# Patient Record
Sex: Male | Born: 1955
Health system: Southern US, Community
[De-identification: ages and names within clinical notes are randomized; demographics above are authoritative.]

## PROBLEM LIST (undated history)

## (undated) DIAGNOSIS — Z87442 Personal history of urinary calculi: Secondary | ICD-10-CM

## (undated) DIAGNOSIS — Z860100 Personal history of colon polyps, unspecified: Secondary | ICD-10-CM

## (undated) DIAGNOSIS — L409 Psoriasis, unspecified: Secondary | ICD-10-CM

## (undated) DIAGNOSIS — Z808 Family history of malignant neoplasm of other organs or systems: Secondary | ICD-10-CM

## (undated) DIAGNOSIS — K859 Acute pancreatitis without necrosis or infection, unspecified: Secondary | ICD-10-CM

## (undated) DIAGNOSIS — N200 Calculus of kidney: Secondary | ICD-10-CM

## (undated) DIAGNOSIS — Z800 Family history of malignant neoplasm of digestive organs: Secondary | ICD-10-CM

## (undated) DIAGNOSIS — K219 Gastro-esophageal reflux disease without esophagitis: Secondary | ICD-10-CM

## (undated) HISTORY — PX: OTHER SURGICAL HISTORY: SHX169

## (undated) HISTORY — DX: Family history of malignant neoplasm of digestive organs: Z80.0

## (undated) HISTORY — DX: Personal history of colon polyps, unspecified: Z86.0100

## (undated) HISTORY — DX: Calculus of kidney: N20.0

## (undated) HISTORY — DX: Personal history of colonic polyps: Z86.010

## (undated) HISTORY — DX: Family history of malignant neoplasm of other organs or systems: Z80.8

## (undated) HISTORY — PX: COLONOSCOPY: SHX174

## (undated) HISTORY — DX: Psoriasis, unspecified: L40.9

## (undated) HISTORY — PX: ERCP: SHX60

## (undated) HISTORY — DX: Acute pancreatitis without necrosis or infection, unspecified: K85.90

## (undated) HISTORY — PX: SHOULDER SURGERY: SHX246

## (undated) HISTORY — DX: Gastro-esophageal reflux disease without esophagitis: K21.9

---

## 1992-08-04 ENCOUNTER — Encounter: Payer: Self-pay | Admitting: Internal Medicine

## 2003-12-08 LAB — HM COLONOSCOPY

## 2005-06-03 ENCOUNTER — Emergency Department: Payer: Self-pay | Admitting: Emergency Medicine

## 2005-06-19 ENCOUNTER — Ambulatory Visit: Payer: Self-pay

## 2005-06-20 ENCOUNTER — Ambulatory Visit: Payer: Self-pay

## 2005-07-30 ENCOUNTER — Ambulatory Visit (HOSPITAL_BASED_OUTPATIENT_CLINIC_OR_DEPARTMENT_OTHER): Admission: RE | Admit: 2005-07-30 | Discharge: 2005-07-30 | Payer: Self-pay | Admitting: Orthopaedic Surgery

## 2009-04-28 ENCOUNTER — Ambulatory Visit: Payer: Self-pay | Admitting: Internal Medicine

## 2009-04-28 ENCOUNTER — Encounter (INDEPENDENT_AMBULATORY_CARE_PROVIDER_SITE_OTHER): Payer: Self-pay | Admitting: *Deleted

## 2009-04-28 DIAGNOSIS — J309 Allergic rhinitis, unspecified: Secondary | ICD-10-CM | POA: Insufficient documentation

## 2009-04-28 DIAGNOSIS — Z860100 Personal history of colon polyps, unspecified: Secondary | ICD-10-CM | POA: Insufficient documentation

## 2009-04-28 DIAGNOSIS — K219 Gastro-esophageal reflux disease without esophagitis: Secondary | ICD-10-CM | POA: Insufficient documentation

## 2009-05-01 ENCOUNTER — Telehealth: Payer: Self-pay | Admitting: Internal Medicine

## 2009-05-11 LAB — CONVERTED CEMR LAB
ALT: 38 units/L (ref 0–53)
AST: 23 units/L (ref 0–37)
Albumin: 4.3 g/dL (ref 3.5–5.2)
Alkaline Phosphatase: 71 units/L (ref 39–117)
BUN: 14 mg/dL (ref 6–23)
Basophils Absolute: 0 10*3/uL (ref 0.0–0.1)
Basophils Relative: 0.4 % (ref 0.0–3.0)
Bilirubin, Direct: 0 mg/dL (ref 0.0–0.3)
CO2: 30 meq/L (ref 19–32)
Calcium: 8.9 mg/dL (ref 8.4–10.5)
Chloride: 103 meq/L (ref 96–112)
Cholesterol: 203 mg/dL — ABNORMAL HIGH (ref 0–200)
Creatinine, Ser: 0.9 mg/dL (ref 0.4–1.5)
Direct LDL: 154.8 mg/dL
Eosinophils Absolute: 0.2 10*3/uL (ref 0.0–0.7)
Eosinophils Relative: 3.3 % (ref 0.0–5.0)
GFR calc non Af Amer: 93.48 mL/min (ref >60)
Glucose, Bld: 95 mg/dL (ref 70–99)
HCT: 43.4 % (ref 39.0–52.0)
HDL: 38.7 mg/dL — ABNORMAL LOW (ref >39.00)
Hemoglobin: 15 g/dL (ref 13.0–17.0)
Lymphocytes Relative: 32.4 % (ref 12.0–46.0)
Lymphs Abs: 2 10*3/uL (ref 0.7–4.0)
MCHC: 34.6 g/dL (ref 30.0–36.0)
MCV: 93.3 fL (ref 78.0–100.0)
Monocytes Absolute: 0.6 10*3/uL (ref 0.1–1.0)
Monocytes Relative: 9.5 % (ref 3.0–12.0)
Neutro Abs: 3.4 10*3/uL (ref 1.4–7.7)
Neutrophils Relative %: 54.4 % (ref 43.0–77.0)
PSA: 0.7 ng/mL (ref 0.10–4.00)
Phosphorus: 3.3 mg/dL (ref 2.3–4.6)
Platelets: 207 10*3/uL (ref 150.0–400.0)
Potassium: 4.2 meq/L (ref 3.5–5.1)
RBC: 4.65 M/uL (ref 4.22–5.81)
RDW: 12.3 % (ref 11.5–14.6)
Sodium: 140 meq/L (ref 135–145)
TSH: 0.98 microintl units/mL (ref 0.35–5.50)
Total Bilirubin: 1.4 mg/dL — ABNORMAL HIGH (ref 0.3–1.2)
Total CHOL/HDL Ratio: 5
Total Protein: 7.3 g/dL (ref 6.0–8.3)
Triglycerides: 81 mg/dL (ref 0.0–149.0)
VLDL: 16.2 mg/dL (ref 0.0–40.0)
WBC: 6.2 10*3/uL (ref 4.5–10.5)

## 2010-08-07 NOTE — Letter (Signed)
Summary: [facility] Records  [facility] Records   Imported By: Beau Fanny 05/03/2009 10:55:35  _____________________________________________________________________  External Attachment:    Type:   Image     Comment:   External Document

## 2010-08-07 NOTE — Assessment & Plan Note (Signed)
Summary: NEW PATIENT TO EST/MK   Vital Signs:  Patient profile:   55 year old male Height:      70.5 inches Weight:      230 pounds BMI:     32.65 Temp:     97.6 degrees F oral Pulse rate:   68 / minute Pulse rhythm:   regular BP sitting:   128 / 70  (left arm) Cuff size:   regular  Vitals Entered By: Mervin Hack CMA Duncan Dull) (April 28, 2009 12:07 PM)  History of Present Illness: Chief Complaint: new patient to establish care  Feels well Just needed a check up  Ongoing psoriasis finally with reasonable control on enbrel     Preventive Screening-Counseling & Management  Alcohol-Tobacco     Smoking Status: quit  Allergies (verified): No Known Drug Allergies  Past History:  Past Medical History: Psaoriasis---------------------------------------------Dr Henderson Colonic polyps, hx of GERD Allergic rhinitis  Past Surgical History: Pancreatitis    ~2000        from alcohol presumably  Family History: Dad died of blockage to brain @86 . Had prostate cancer Mom died of stomach cancer in her 11's 1 brother  1 sister 2 half sisters-- 1 had colon cancer 1 half brother--died of DM complicaitons DM on Dad's side No CAD Dad and brother with HTN  Social History: Occupation: Manufacturing engineer daughters Former Smoker--quit in 0102'V Alcohol use-no. Quit drinking after pancreatitis Occupation:  employed Smoking Status:  quit  Review of Systems General:  No regular exercise --just work Weight is stable over the past year Sleeps fine--wife notes snoring at times wears seat belt. Eyes:  Denies double vision and vision loss-1 eye. ENT:  Denies decreased hearing and ringing in ears; full dentures--no problems. CV:  Complains of shortness of breath with exertion; denies chest pain or discomfort, difficulty breathing at night, difficulty breathing while lying down, fainting, lightheadness, and palpitations; stable DOE. Resp:  Denies cough  and shortness of breath. GI:  Complains of indigestion; denies abdominal pain, bloody stools, change in bowel habits, dark tarry stools, nausea, and vomiting; takes zantac and now prevacid---did better with Rx in past. GU:  Denies erectile dysfunction, urinary frequency, and urinary hesitancy. MS:  Complains of joint pain; denies joint swelling; some knee pain at times. Derm:  Denies lesion(s) and rash; Did have Basal cell carcinoma on left shoulder. Neuro:  Complains of numbness and tingling; denies headaches and weakness; occ numbness in hands when driving or in bed--quickly resolves. Psych:  Denies anxiety and depression. Heme:  Denies abnormal bruising and enlarge lymph nodes. Allergy:  Complains of seasonal allergies and sneezing; uses OTC med.  Physical Exam  General:  alert and normal appearance.   Eyes:  pupils equal, pupils round, pupils reactive to light, and no optic disk abnormalities.   Ears:  R ear normal and L ear normal.   Mouth:  no erythema, no lesions, and edentulous.   Neck:  supple, no masses, no thyromegaly, no carotid bruits, and no cervical lymphadenopathy.   Lungs:  normal respiratory effort and normal breath sounds.   Heart:  normal rate, regular rhythm, no murmur, and no gallop.   Abdomen:  soft and non-tender.   Rectal:  no hemorrhoids and no masses.   Prostate:  no gland enlargement and no nodules.   Msk:  no joint tenderness and no joint swelling.   Pulses:  1+ in feet Extremities:  no edema Neurologic:  alert & oriented X3 and strength normal in  all extremities.   Skin:  no rashes and no suspicious lesions.   Axillary Nodes:  No palpable lymphadenopathy Psych:  normally interactive, good eye contact, not anxious appearing, and not depressed appearing.     Impression & Recommendations:  Problem # 1:  PREVENTIVE HEALTH CARE (ICD-V70.0) Assessment Comment Only  due for PSA  Orders: TLB-Lipid Panel (80061-LIPID)  Problem # 2:  COLONIC POLYPS, HX OF  (ICD-V12.72) Assessment: Comment Only  colon >5 years ago due for follow up  Orders: Venipuncture ([address])  Problem # 3:  GERD (ICD-530.81) Assessment: Deteriorated  will change to prilosec  His updated medication list for this problem includes:    Prilosec Otc 20 Mg Tbec (Omeprazole magnesium) .Marland Kitchen... 1 daily for indigestion  Orders: TLB-Renal Function Panel (80069-RENAL) TLB-CBC Platelet - w/Differential (85025-CBCD) TLB-Hepatic/Liver Function Pnl (80076-HEPATIC) TLB-TSH (Thyroid Stimulating Hormone) (84443-TSH)  Complete Medication List: 1)  Enbrel 50 Mg/ml Soln (Etanercept) .... Inject once weekly 2)  Prilosec Otc 20 Mg Tbec (Omeprazole magnesium) .Marland Kitchen.. 1 daily for indigestion  Other Orders: TLB-PSA (Prostate Specific Antigen) (84153-PSA)  Patient Instructions: 1)  Please schedule a follow-up appointment in 1 year.  2)  Schedule a colonoscopy/ sigmoidoscopy to help detect colon cancer.   Current Allergies (reviewed today): No known allergies    Colonoscopy  Procedure date:  07/08/2002  Findings:      done for bleeding Small polyps    Immunization History:  Tetanus/Td Immunization History:    Tetanus/Td:  Historical (07/08/2005)

## 2010-08-07 NOTE — Letter (Signed)
Summary: Previsit letter  [facility]  [address] [place], [place] [address]   Phone: [phone]  Fax: [phone]       04/28/2009 MRN: [id number]  Cory [name] [address] [place], [place]  [address]  Dear Cory [name],  Welcome to the Gastroenterology Division at [facility].    You are scheduled to see a nurse for your pre-procedure visit on 05-26-09 at 9am on the 3rd floor at [facility], [address]  We ask that you try to arrive at our office 15 minutes prior to your appointment time to allow for check-in.  Your nurse visit will consist of discussing your medical and surgical history, your immediate family medical history, and your medications.    Please bring a complete list of all your medications or, if you prefer, bring the medication bottles and we will list them.  We will need to be aware of both prescribed and over the counter drugs.  We will need to know exact dosage information as well.  If you are on blood thinners (Coumadin, Plavix, Aggrenox, Ticlid, etc.) please call our office today/prior to your appointment, as we need to consult with your physician about holding your medication.   Please be prepared to read and sign documents such as consent forms, a financial agreement, and acknowledgement forms.  If necessary, and with your consent, a friend or relative is welcome to sit-in on the nurse visit with you.  Please bring your insurance card so that we may make a copy of it.  If your insurance requires a referral to see a specialist, please bring your referral form from your primary care physician.  No co-pay is required for this nurse visit.     If you cannot keep your appointment, please call [phone] to cancel or reschedule prior to your appointment date.  This allows [place] the opportunity to schedule an appointment for another patient in need of care.    Thank you for choosing [facility] for your medical needs.  We  appreciate the opportunity to care for you.  Please visit [place] at our website  to learn more about our practice.                     Sincerely.                                                                                                                   The Gastroenterology Division

## 2010-08-07 NOTE — Progress Notes (Signed)
  Phone Note From Other Clinic   Summary of Call: records from Dr Maryruth Bun reviewed He had colonoscopy 12/08/03 and it showed only internal hemorrhoids He is not due again till 2015! Initial call taken by: Cindee Salt MD,  May 01, 2009 5:25 PM  Follow-up for Phone Call        Called wife told her due date of husbands next Colonoscopy 2015. Follow-up by: Carlton Adam,  May 02, 2009 11:35 AM      Colonoscopy  Procedure date:  12/08/2003  Findings:      Results: Hemorrhoids.  Dr Maryruth Bun

## 2010-11-23 NOTE — Op Note (Signed)
NAMEBRENDAN, Cory [name]                ACCOUNT NO.:  [id number]   MEDICAL RECORD NO.:  [id number]          PATIENT TYPE:  AMB   LOCATION:  DSC                          FACILITY:  [facility]   PHYSICIAN:  Lubertha Basque. Dalldorf, M.D.DATE OF BIRTH:  1956/07/08   DATE OF PROCEDURE:  07/30/2005  DATE OF DISCHARGE:                                 OPERATIVE REPORT   PREOPERATIVE DIAGNOSES:  1.  Left shoulder impingement.  2.  Left shoulder rotator cuff tear.   POSTOPERATIVE DIAGNOSES:  1.  Left shoulder impingement.  2.  Left shoulder rotator cuff tear.  3.  Left shoulder biceps tear.   PROCEDURE:  1.  Left shoulder arthroscopic acromioplasty.  2.  Left shoulder debridement biceps stump and rotator cuff.  3.  Left shoulder partial claviculectomy.  .   ANESTHESIA:  General and block.   ATTENDING SURGEON:  Lubertha Basque. Jerl Santos, M.D.   ASSISTANT:  Lindwood Qua, P.A.   INDICATIONS FOR PROCEDURE:  The patient is a 55 year old man who injured his  left shoulder at work about two months ago.  He has persisted with pain and  weakness in the shoulder despite aggressive physical therapy and activity  restriction.  By MRI scan, he has a retracted supraspinatus tear.  He has  difficulty using this arm and pain at rest and is offered an arthroscopy  with probable rotator cuff repair either arthroscopic or open.  Informed  operative consent was obtained after the discussion about the possible  complications of,  reaction to anesthesia and infection.   DESCRIPTION OF PROCEDURE:  The patient was taken to the operating suite  where a general anesthetic was applied without difficulty.  He was also  given a block in the preanesthesia area.  He was  positioned in beach-chair  position and prepped, draped in a normal sterile fashion.  After the  administration of IV Kefzol, an arthroscopy of the left shoulder was  performed through a total of three portals.  The glenohumeral joint showed  no degenerative  change while the biceps tendon appeared to be ruptured with  a stump of this tendon stuck in the joint.  The rotator cuff was basically  completely absent.  The entire humeral head at the  greater tuberosity was  bare.  I performed a conservative acromioplasty with the bur in the lateral  position followed by transfer of the bur to the posterior position.  I also  removed a spur from the distal aspect of the clavicle.  I then dissected for  a rotator cuff and really found very little tissue.  I freed this up with  the shaver and a arthroscopic grabber, but I was unable to advance the  residual rotator cuff more than about 1 cm off the rim of the glenoid. This  was not felt amenable to repair.  A debridement was done.  He did have small  slips of the anterior and posterior tissue left, but this was a massive tear  and involved most of  the rotator cuff.  The subscapularis also did not  appear completely normal and was  debrided back to fairly stable appearing  tissue.  The shoulder was thoroughly irrigated followed by placement of  Marcaine.  Simple sutures of nylon were used to loosely reapproximate the  portals followed by Adaptic and a dry gauze dressing with tape.  Estimated  blood loss and intraoperative fluids can be obtained from the anesthesia  records.   DISPOSITION:  The patient was extubated in the operating room and taken to  recovery in stable condition.  Plans were for him to go home the same day  and to follow up in the office in a week.  I will contact him by phone  tonight.      Lubertha Basque Jerl Santos, M.D.  Electronically Signed     PGD/MEDQ  D:  07/30/2005  T:  07/31/2005  Job:  [id number]

## 2011-01-28 ENCOUNTER — Encounter: Payer: Self-pay | Admitting: Gastroenterology

## 2011-01-28 ENCOUNTER — Other Ambulatory Visit (INDEPENDENT_AMBULATORY_CARE_PROVIDER_SITE_OTHER): Payer: 59

## 2011-01-28 ENCOUNTER — Ambulatory Visit (INDEPENDENT_AMBULATORY_CARE_PROVIDER_SITE_OTHER)
Admission: RE | Admit: 2011-01-28 | Discharge: 2011-01-28 | Disposition: A | Discharge status: Home / Self Care | Payer: 59 | Source: Ambulatory Visit | Attending: Gastroenterology | Admitting: Gastroenterology

## 2011-01-28 ENCOUNTER — Ambulatory Visit (INDEPENDENT_AMBULATORY_CARE_PROVIDER_SITE_OTHER): Payer: 59 | Admitting: Gastroenterology

## 2011-01-28 DIAGNOSIS — K859 Acute pancreatitis without necrosis or infection, unspecified: Secondary | ICD-10-CM

## 2011-01-28 DIAGNOSIS — R109 Unspecified abdominal pain: Secondary | ICD-10-CM

## 2011-01-28 DIAGNOSIS — R1011 Right upper quadrant pain: Secondary | ICD-10-CM | POA: Insufficient documentation

## 2011-01-28 DIAGNOSIS — K219 Gastro-esophageal reflux disease without esophagitis: Secondary | ICD-10-CM

## 2011-01-28 LAB — CBC WITH DIFFERENTIAL/PLATELET
Basophils Absolute: 0 10*3/uL (ref 0.0–0.1)
Basophils Relative: 0.4 % (ref 0.0–3.0)
Eosinophils Absolute: 0.3 10*3/uL (ref 0.0–0.7)
Eosinophils Relative: 4 % (ref 0.0–5.0)
HCT: 41.5 % (ref 39.0–52.0)
Hemoglobin: 14.2 g/dL (ref 13.0–17.0)
Lymphocytes Relative: 30.6 % (ref 12.0–46.0)
Lymphs Abs: 1.9 10*3/uL (ref 0.7–4.0)
MCHC: 34.2 g/dL (ref 30.0–36.0)
MCV: 93.1 fl (ref 78.0–100.0)
Monocytes Absolute: 0.5 10*3/uL (ref 0.1–1.0)
Monocytes Relative: 8.2 % (ref 3.0–12.0)
Neutro Abs: 3.6 10*3/uL (ref 1.4–7.7)
Neutrophils Relative %: 56.8 % (ref 43.0–77.0)
Platelets: 217 10*3/uL (ref 150.0–400.0)
RBC: 4.46 Mil/uL (ref 4.22–5.81)
RDW: 13.5 % (ref 11.5–14.6)
WBC: 6.3 10*3/uL (ref 4.5–10.5)

## 2011-01-28 LAB — COMPREHENSIVE METABOLIC PANEL
ALT: 23 U/L (ref 0–53)
AST: 16 U/L (ref 0–37)
Albumin: 4.5 g/dL (ref 3.5–5.2)
Alkaline Phosphatase: 77 U/L (ref 39–117)
BUN: 13 mg/dL (ref 6–23)
CO2: 27 mEq/L (ref 19–32)
Calcium: 8.7 mg/dL (ref 8.4–10.5)
Chloride: 105 mEq/L (ref 96–112)
Creatinine, Ser: 0.8 mg/dL (ref 0.4–1.5)
GFR: 103.41 mL/min (ref >60.00)
Glucose, Bld: 96 mg/dL (ref 70–99)
Potassium: 4 mEq/L (ref 3.5–5.1)
Sodium: 137 mEq/L (ref 135–145)
Total Bilirubin: 0.6 mg/dL (ref 0.3–1.2)
Total Protein: 7.7 g/dL (ref 6.0–8.3)

## 2011-01-28 LAB — URINALYSIS, ROUTINE W REFLEX MICROSCOPIC
Bilirubin Urine: NEGATIVE
Hgb urine dipstick: NEGATIVE
Ketones, ur: NEGATIVE
Leukocytes, UA: NEGATIVE
Nitrite: NEGATIVE
Specific Gravity, Urine: 1.025 (ref 1.000–1.030)
Total Protein, Urine: NEGATIVE
Urine Glucose: NEGATIVE
Urobilinogen, UA: 0.2 (ref 0.0–1.0)
pH: 5.5 (ref 5.0–8.0)

## 2011-01-28 MED ORDER — PANTOPRAZOLE SODIUM 20 MG PO TBEC: 40.0000 mg | DELAYED_RELEASE_TABLET | Freq: Every day | ORAL | Status: DC

## 2011-01-28 NOTE — Patient Instructions (Signed)
You will go to the basement today for labs You will go to the basement today for an X-Ray We are giving you Priolsec samples We will send in a prescription to your pharmacy of the Prilosec

## 2011-01-28 NOTE — Progress Notes (Signed)
History of Present Illness:  Cory [name] is a 55 year old white male referred at the request of Dr. Alphonsus Sias for evaluation of abdominal pain. Approximately 2 days ago he developed severe right flank pain that lasted about 20 minutes. He had a second episode a day later of less intensity. He denies dysuria or urinary frequency. Pain was not accompanied by nausea, vomiting or change in bowel habits. He has had discomfort in his right lower back in the past with bending and twisting.  Patient has a history of colon polyps and was last examined over 8 years ago.  He complains of frequent pyrosis for which he is taking Zantac with limited success.    Review of Systems: Pertinent positive and negative review of systems were noted in the above HPI section. All other review of systems were otherwise negative.    Current Medications, Allergies, Past Medical History, Past Surgical History, Family History and Social History were reviewed in [employer] electronic medical record  Vital signs were reviewed in today's medical record. Physical Exam: General: Well developed , well nourished, no acute distress Head: Normocephalic and atraumatic Eyes:  sclerae anicteric, EOMI Ears: Normal auditory acuity Mouth: No deformity or lesions Lungs: Clear throughout to auscultation Heart: Regular rate and rhythm; no murmurs, rubs or bruits Abdomen: Soft, non tender and non distended. No masses, hepatosplenomegaly or hernias noted. Normal Bowel sounds; there is no CVA tenderness Rectal:deferred Musculoskeletal: Symmetrical with no gross deformities  Pulses:  Normal pulses noted Extremities: No clubbing, cyanosis, edema or deformities noted Neurological: Alert oriented x 4, grossly nonfocal Psychological:  Alert and cooperative. Normal mood and affect

## 2011-01-28 NOTE — Assessment & Plan Note (Signed)
Plan followup colonoscopy once his current issue of abdominal pain has resolved

## 2011-01-28 NOTE — Assessment & Plan Note (Addendum)
Plan to begin Protonix 40 mg daily 

## 2011-01-28 NOTE — Assessment & Plan Note (Signed)
Right flank pain suspicious for nephrolithiasis. Active intra-abdominal disease is less likely.  Medications #1 urinalysis and abdominal x-ray #2 CBC and LFTs

## 2011-01-30 ENCOUNTER — Telehealth: Payer: Self-pay | Admitting: *Deleted

## 2011-01-30 DIAGNOSIS — R1011 Right upper quadrant pain: Secondary | ICD-10-CM

## 2011-01-30 NOTE — Telephone Encounter (Signed)
DR Arlyce Dice, DID YOU WANT ME TO SCHEDULE THIS PT FOR A CT ABDOMEN/PELVIS??

## 2011-01-30 NOTE — Telephone Encounter (Signed)
Message copied by Marlowe Kays on Wed Jan 30, 2011  4:46 PM ------      Message from: Melvia Heaps MD D      Created: Mon Jan 28, 2011  2:12 PM       Informed patient that x-ray and lab work were normal. There is scheduled him for a CT of the abdomen and pelvis please

## 2011-01-31 NOTE — Telephone Encounter (Signed)
SCHEDULED CT SCAN FOR Monday 02/04/2011 AT 9AM CALLED PT TO INFORM L/M TO R/C

## 2011-01-31 NOTE — Telephone Encounter (Signed)
yes

## 2011-02-01 NOTE — Telephone Encounter (Signed)
Went over instructions with pt, they are to come pick up contrast and instructions today

## 2011-02-04 ENCOUNTER — Ambulatory Visit (INDEPENDENT_AMBULATORY_CARE_PROVIDER_SITE_OTHER)
Admission: RE | Admit: 2011-02-04 | Discharge: 2011-02-04 | Disposition: A | Discharge status: Home / Self Care | Payer: 59 | Source: Ambulatory Visit | Attending: Gastroenterology | Admitting: Gastroenterology

## 2011-02-04 ENCOUNTER — Telehealth: Payer: Self-pay

## 2011-02-04 DIAGNOSIS — R1011 Right upper quadrant pain: Secondary | ICD-10-CM

## 2011-02-04 MED ORDER — IOHEXOL 300 MG/ML  SOLN
100.0000 mL | Freq: Once | INTRAMUSCULAR | Status: AC | PRN
  Administered 2011-02-04: 100 mL via INTRAVENOUS

## 2011-02-04 NOTE — Progress Notes (Signed)
Quick Note:  Inform pt that he has a kidney stone. He needs immediate referral to urology. See if Dr. Jacquelyne Balint can see him ______

## 2011-02-04 NOTE — Telephone Encounter (Signed)
Left message for pt to call back. Spoke with pt and scheduled him to see Dr. Jacquelyne Balint 02/06/11@1pm . Pt states he cannot make this appt. Gave pt the number of [phone] to call and reschedule his appt. Pt states he will call and do this. Records faxed to [phone].

## 2011-02-04 NOTE — Telephone Encounter (Signed)
Message copied by Michele Mcalpine on Mon Feb 04, 2011  3:29 PM ------      Message from: Melvia Heaps MD D      Created: Mon Feb 04, 2011  3:24 PM       Inform pt that he has a kidney stone.      He needs immediate referral to urology.  See if Dr. Jacquelyne Balint can see him

## 2011-03-14 ENCOUNTER — Encounter: Payer: Self-pay | Admitting: Internal Medicine

## 2011-03-15 ENCOUNTER — Encounter: Payer: Self-pay | Admitting: Internal Medicine

## 2011-03-15 ENCOUNTER — Ambulatory Visit (INDEPENDENT_AMBULATORY_CARE_PROVIDER_SITE_OTHER): Payer: 59 | Admitting: Internal Medicine

## 2011-03-15 DIAGNOSIS — H9312 Tinnitus, left ear: Secondary | ICD-10-CM | POA: Insufficient documentation

## 2011-03-15 DIAGNOSIS — H9319 Tinnitus, unspecified ear: Secondary | ICD-10-CM

## 2011-03-15 DIAGNOSIS — N200 Calculus of kidney: Secondary | ICD-10-CM | POA: Insufficient documentation

## 2011-03-15 NOTE — Progress Notes (Signed)
  Subjective:    Patient ID: Cory [name], male    DOB: [birth year], 55 y.o.   MRN: [id number]  HPI Left ear problems for 1-2 weeks Just tinnitus but no pain Concerned about possible swimmer's ear Used OTC solution in past few days but no help Hearing is off  No vertigo No balance or walking problems No changes in vision  Does have shotgun and handgun Shoots off right shoulder  Current Outpatient Prescriptions on File Prior to Visit  Medication Sig Dispense Refill  . ENBREL 50 MG/ML injection 50 mg once a week.       . Ibuprofen 200 MG CAPS Take 1 capsule by mouth as needed.        . pantoprazole (PROTONIX) 20 MG tablet Take 2 tablets (40 mg total) by mouth daily.  30 tablet  2    No Known Allergies  Past Medical History  Diagnosis Date  . Psoriasis   . Pancreatitis, acute   . Personal history of colonic polyps     [facility]  . GERD (gastroesophageal reflux disease)   . Nephrolithiasis     [facility]    Past Surgical History  Procedure Date  . Shoulder surgery   . Groin surgery     as a child    Family History  Problem Relation Age of Onset  . Colon cancer Neg Hx   . Stomach cancer Mother   . Cancer Mother     stomach cancer  . Diabetes Brother   . Hypertension Brother   . Cancer Father     prostate cancer  . Hypertension Father   . Cancer Sister     colon cancer    History   Social History  . Marital Status: Married    Spouse Name: N/A    Number of Children: 2  . Years of Education: N/A   Occupational History  . textile    Social History Main Topics  . Smoking status: Former Smoker    Types: Cigarettes    Quit date: 07/08/1988  . Smokeless tobacco: Never Used  . Alcohol Use: No     recovering alcoholic for 13 years  . Drug Use: No  . Sexually Active: Not on file   Other Topics Concern  . Not on file   Social History Narrative  . No narrative on file      Review of Systems Still has kidney stone---sees urologist  still No nausea or vomiting    Objective:   Physical Exam  Constitutional: He appears well-developed and well-nourished. No distress.  HENT:  Head: Normocephalic and atraumatic.  Right Ear: External ear normal.  Left Ear: External ear normal.  Mouth/Throat: Oropharynx is clear and moist. No oropharyngeal exudate.       Deep hard cerumen bilaterally  Eyes: Conjunctivae and EOM are normal. Pupils are equal, round, and reactive to light.       No nystagmus  Neck: Normal range of motion. Neck supple.  Musculoskeletal: Normal range of motion. He exhibits no edema and no tenderness.  Lymphadenopathy:    He has no cervical adenopathy.  Neurological: He is alert. He has normal strength. He displays no tremor. No cranial nerve deficit. He exhibits normal muscle tone. He displays a negative Romberg sign. Coordination and gait normal.  Psychiatric: He has a normal mood and affect. His behavior is normal. Judgment and thought content normal.          Assessment & Plan:

## 2011-03-15 NOTE — Patient Instructions (Signed)
Please try debrox or similar ear wax removal drops daily for a week (you can also use 1/2 strength peroxide--mix with water). Then flush with warm tap water. If the ringing persists, or your hearing worsens, please call for referral to ear specialist

## 2011-03-15 NOTE — Assessment & Plan Note (Addendum)
Some hearing loss as well This could be from the cerumen No neuro findings to suggest brain stem mass or ischemia  Discussed options He will use cerumen Rx to clear ears If persists or hearing worsens, will proceed with ENT eval

## 2011-03-29 ENCOUNTER — Telehealth: Payer: Self-pay | Admitting: *Deleted

## 2011-03-29 DIAGNOSIS — H6120 Impacted cerumen, unspecified ear: Secondary | ICD-10-CM

## 2011-03-29 NOTE — Telephone Encounter (Signed)
Pt was seen for wax build up in his ears, decreased hearing.  He still has this problem and would like to be referred to ENT.  Prefers [facility], but wants an appt next week, because he's off work,  so he will go to whoever can get him in.

## 2011-04-01 NOTE — Telephone Encounter (Signed)
Appt made with Dr Jenne Campus on 04/02/2011 .MK

## 2011-04-09 ENCOUNTER — Encounter: Payer: Self-pay | Admitting: Family Medicine

## 2011-04-09 ENCOUNTER — Ambulatory Visit (INDEPENDENT_AMBULATORY_CARE_PROVIDER_SITE_OTHER): Payer: 59 | Admitting: Family Medicine

## 2011-04-09 VITALS — BP 120/70 | HR 98 | Temp 97.7°F | Ht 71.0 in | Wt 220.0 lb

## 2011-04-09 DIAGNOSIS — M25519 Pain in unspecified shoulder: Secondary | ICD-10-CM

## 2011-04-09 DIAGNOSIS — M259 Joint disorder, unspecified: Secondary | ICD-10-CM

## 2011-04-09 DIAGNOSIS — M19019 Primary osteoarthritis, unspecified shoulder: Secondary | ICD-10-CM | POA: Insufficient documentation

## 2011-04-09 DIAGNOSIS — M25511 Pain in right shoulder: Secondary | ICD-10-CM | POA: Insufficient documentation

## 2011-04-09 MED ORDER — DICLOFENAC SODIUM 75 MG PO TBEC
75.0000 mg | DELAYED_RELEASE_TABLET | Freq: Two times a day (BID) | ORAL | Status: AC
Start: 2011-04-09 — End: 2012-04-08

## 2011-04-09 NOTE — Patient Instructions (Signed)
Recheck in 4-5 weeks

## 2011-04-09 NOTE — Progress Notes (Signed)
  Subjective:    Patient ID: Cory [name], male    DOB: 01/23/1956, 55 y.o.   MRN: [id number]  HPI  Cory [name], a 55 y.o. male presents today in the office for the following:    Right shoulder has been hurting. Has been aching and hurting at night. Hurting at night and some pain with overhead. At least a month, maybe two or three months. No distinct injury. The patient does do repetitive motions with his hand and upper chest remedies on the right as well as left. Pain with reaching across his body.  He does have a history of a left rotator cuff repair. No prior injuries, fractures, or dislocations or operative interventions on the right shoulder.  Abd hurts the most.   U/s R AC joint inj  The PMH, PSH, Social History, Family History, Medications, and allergies have been reviewed in [facility], and have been updated if relevant.   Review of Systems REVIEW OF SYSTEMS  GEN: No fevers, chills. Nontoxic. Primarily MSK c/o today. MSK: Detailed in the HPI GI: tolerating PO intake without difficulty Neuro: No numbness, parasthesias, or tingling associated. Otherwise the pertinent positives of the ROS are noted above.      Objective:   Physical Exam   Physical Exam  Blood pressure 120/70, pulse 98, temperature 97.7 F (36.5 C), temperature source Oral, height 5\' 11"  (1.803 m), weight 220 lb (99.791 kg), SpO2 98.00%.  GEN: WDWN, NAD, Non-toxic, A & O x 3 HEENT: Atraumatic, Normocephalic. Neck supple. No masses, No LAD. Ears and Nose: No external deformity. EXTR: No c/c/e NEURO Normal gait.  PSYCH: Normally interactive. Conversant. Not depressed or anxious appearing.  Calm demeanor.   Right shoulder: Full range of motion with a painful arc of motion minimal. Tender to palpation at the a.c. joint, nontender along the clavicle. Positive crossover. Positive crossover compression test. Negative speed test. Negative Yergason status. Negative Neer test. Negative Leanord Asal test. Negative  sulcus sign. No generalized laxity. Negative apprehension test.  Neurovascularly intact  Diagnostic Ultrasound Evaluation Terason t3000, MSK ultrasound, MSK probe Anatomy scanned: R shoulder Indication: Pain Findings: The supraspinatus and subscapularis appear intact. There is no subcoracoid impingement and no subacromial impingement with dynamic testing. No subacromial bursitis is seen. There is some additional fluid seen at the acromioclavicular joint with minimal osteoarthritic changes. No fluid significantly at the biceps tendon, which also appears intact.     Assessment & Plan:   1. AC joint arthropathy  diclofenac (VOLTAREN) 75 MG EC tablet  2. Shoulder pain, right      I think this is primarily inflammation at the a.c. joint. This is often seen with people who do repetitive motion activities or do heavy bench pressing. We will try to do a a.c. joint injection, place him on scheduling inflammatories.  AC Joint Injection, RIGHT Verbal consent was obtained from the patient. Risks, benefits, and alternatives have been reviewed. The patient was prepped with Betadine. Ethyl Chloride used for anesthesia. Under sterile conditions, 1/2 cc of Lidocaine 1% and 1/2 cc of depo-medrol 40 mg directly injected into at the superior-lateral AC joint. The patient tolerated the procedure well and had decreased symptoms after injection. No complications.

## 2011-05-13 ENCOUNTER — Ambulatory Visit: Payer: 59 | Admitting: Family Medicine

## 2011-09-13 ENCOUNTER — Ambulatory Visit (INDEPENDENT_AMBULATORY_CARE_PROVIDER_SITE_OTHER): Payer: 59 | Admitting: Internal Medicine

## 2011-09-13 ENCOUNTER — Encounter: Payer: Self-pay | Admitting: Internal Medicine

## 2011-09-13 VITALS — BP 138/70 | HR 64 | Temp 98.4°F | Ht 71.0 in | Wt 227.0 lb

## 2011-09-13 DIAGNOSIS — L408 Other psoriasis: Secondary | ICD-10-CM

## 2011-09-13 DIAGNOSIS — E785 Hyperlipidemia, unspecified: Secondary | ICD-10-CM | POA: Insufficient documentation

## 2011-09-13 DIAGNOSIS — L409 Psoriasis, unspecified: Secondary | ICD-10-CM | POA: Insufficient documentation

## 2011-09-13 DIAGNOSIS — Z0000 Encounter for general adult medical examination without abnormal findings: Secondary | ICD-10-CM | POA: Insufficient documentation

## 2011-09-13 LAB — LIPID PANEL
Cholesterol: 190 mg/dL (ref 0–200)
HDL: 42.7 mg/dL (ref >39.00)
LDL Cholesterol: 135 mg/dL — ABNORMAL HIGH (ref 0–99)
Total CHOL/HDL Ratio: 4
Triglycerides: 63 mg/dL (ref 0.0–149.0)
VLDL: 12.6 mg/dL (ref 0.0–40.0)

## 2011-09-13 LAB — PSA: PSA: 0.67 ng/mL (ref 0.10–4.00)

## 2011-09-13 LAB — GLUCOSE, RANDOM: Glucose, Bld: 77 mg/dL (ref 70–99)

## 2011-09-13 NOTE — Assessment & Plan Note (Signed)
Generally healthy Discussed fitness Will do PSA after discussion

## 2011-09-13 NOTE — Progress Notes (Signed)
Subjective:    Patient ID: Cory [name], male    DOB: April 09, 1956, 56 y.o.   MRN: [id number]  HPI Here for physical Shoulder still aches--esp at night. Tries some advil--some help  Still on embrel for psoriasis Dr Orson Aloe monitors labs  Had kidney stone that he passed Has another one up higher---doesn't seem to be giving any problems  No real fitness efforts Had been laid off for a while but is back again  Current Outpatient Prescriptions on File Prior to Visit  Medication Sig Dispense Refill  . diclofenac (VOLTAREN) 75 MG EC tablet Take 1 tablet (75 mg total) by mouth 2 (two) times daily.  60 tablet  3  . ENBREL 50 MG/ML injection 50 mg once a week.         No Known Allergies  Past Medical History  Diagnosis Date  . Psoriasis   . Pancreatitis, acute   . Personal history of colonic polyps     [facility]  . GERD (gastroesophageal reflux disease)   . Nephrolithiasis     [facility]  . Hyperlipidemia     Past Surgical History  Procedure Date  . Shoulder surgery   . Groin surgery     as a child    Family History  Problem Relation Age of Onset  . Colon cancer Neg Hx   . Stomach cancer Mother   . Cancer Mother     stomach cancer  . Diabetes Brother   . Hypertension Brother   . Cancer Father     prostate cancer  . Hypertension Father   . Cancer Sister     colon cancer    History   Social History  . Marital Status: Married    Spouse Name: N/A    Number of Children: 2  . Years of Education: N/A   Occupational History  . textile    Social History Main Topics  . Smoking status: Former Smoker    Types: Cigarettes    Quit date: 07/08/1988  . Smokeless tobacco: Never Used  . Alcohol Use: No     recovering alcoholic for 13 years  . Drug Use: No  . Sexually Active: Not on file   Other Topics Concern  . Not on file   Social History Narrative  . No narrative on file   Review of Systems  Constitutional: Positive for unexpected weight  change. Negative for fatigue.       Weight up some Wears seat belt  HENT: Negative for hearing loss, dental problem and tinnitus.        Full dentures---no current problems with them Mild seasonal allergies---no meds usually  Eyes: Negative for visual disturbance.       No diplopia or unilateral vision loss Has floater on right---going to eye doctor  Respiratory: Negative for cough, chest tightness and shortness of breath.   Cardiovascular: Positive for chest pain. Negative for palpitations and leg swelling.       Occ left chest pain he attributes to indigestion. Resolves on his own. Is usually postprandial  Gastrointestinal: Negative for nausea, vomiting, abdominal pain, constipation and blood in stool.       Heartburn in past Not as bad of late  Genitourinary: Negative for urgency, frequency and difficulty urinating.       No sexual problems  Musculoskeletal: Positive for arthralgias. Negative for back pain and joint swelling.  Skin: Positive for rash.       Sees Dr Orson Aloe regularly  Neurological: Negative  for dizziness, syncope, weakness, light-headedness, numbness and headaches.  Hematological: Negative for adenopathy. Does not bruise/bleed easily.  Psychiatric/Behavioral: Negative for sleep disturbance and dysphoric mood. The patient is not nervous/anxious.        Objective:   Physical Exam  Constitutional: He is oriented to person, place, and time. He appears well-developed and well-nourished. No distress.  HENT:  Head: Normocephalic and atraumatic.  Right Ear: External ear normal.  Left Ear: External ear normal.  Mouth/Throat: Oropharynx is clear and moist. No oropharyngeal exudate.  Eyes: Conjunctivae and EOM are normal. Pupils are equal, round, and reactive to light.  Neck: Normal range of motion. Neck supple. No thyromegaly present.  Cardiovascular: Normal rate, regular rhythm, normal heart sounds and intact distal pulses.  Exam reveals no gallop.   No murmur  heard. Pulmonary/Chest: Effort normal and breath sounds normal. No respiratory distress. He has no wheezes. He has no rales.  Abdominal: Soft. There is no tenderness.  Musculoskeletal: He exhibits no edema and no tenderness.  Lymphadenopathy:    He has no cervical adenopathy.  Neurological: He is alert and oriented to person, place, and time.  Skin: Rash noted.       Scattered psoriatic lesions  Psychiatric: He has a normal mood and affect. His behavior is normal. Thought content normal.          Assessment & Plan:

## 2011-09-13 NOTE — Assessment & Plan Note (Signed)
Dr Orson Aloe treats with Rodena Medin

## 2011-09-24 ENCOUNTER — Encounter: Payer: Self-pay | Admitting: *Deleted

## 2012-05-05 ENCOUNTER — Emergency Department: Payer: Self-pay | Admitting: Emergency Medicine

## 2012-05-15 ENCOUNTER — Emergency Department: Payer: Self-pay | Admitting: Emergency Medicine

## 2012-09-25 ENCOUNTER — Encounter: Payer: 59 | Admitting: Internal Medicine

## 2012-10-06 ENCOUNTER — Encounter: Payer: Self-pay | Admitting: Internal Medicine

## 2012-10-06 ENCOUNTER — Ambulatory Visit (INDEPENDENT_AMBULATORY_CARE_PROVIDER_SITE_OTHER): Payer: 59 | Admitting: Internal Medicine

## 2012-10-06 VITALS — BP 126/64 | HR 59 | Temp 97.5°F | Ht 71.0 in | Wt 225.0 lb

## 2012-10-06 DIAGNOSIS — K219 Gastro-esophageal reflux disease without esophagitis: Secondary | ICD-10-CM

## 2012-10-06 DIAGNOSIS — L408 Other psoriasis: Secondary | ICD-10-CM

## 2012-10-06 DIAGNOSIS — Z0000 Encounter for general adult medical examination without abnormal findings: Secondary | ICD-10-CM

## 2012-10-06 DIAGNOSIS — L409 Psoriasis, unspecified: Secondary | ICD-10-CM

## 2012-10-06 LAB — BASIC METABOLIC PANEL
BUN: 17 mg/dL (ref 6–23)
CO2: 26 mEq/L (ref 19–32)
Calcium: 9.1 mg/dL (ref 8.4–10.5)
Chloride: 105 mEq/L (ref 96–112)
Creatinine, Ser: 0.9 mg/dL (ref 0.4–1.5)
GFR: 93.51 mL/min (ref >60.00)
Glucose, Bld: 97 mg/dL (ref 70–99)
Potassium: 4.2 mEq/L (ref 3.5–5.1)
Sodium: 137 mEq/L (ref 135–145)

## 2012-10-06 LAB — CBC WITH DIFFERENTIAL/PLATELET
Basophils Absolute: 0 10*3/uL (ref 0.0–0.1)
Basophils Relative: 0.7 % (ref 0.0–3.0)
Eosinophils Absolute: 0.2 10*3/uL (ref 0.0–0.7)
Eosinophils Relative: 3.6 % (ref 0.0–5.0)
HCT: 43.4 % (ref 39.0–52.0)
Hemoglobin: 14.8 g/dL (ref 13.0–17.0)
Lymphocytes Relative: 32.5 % (ref 12.0–46.0)
Lymphs Abs: 2.1 10*3/uL (ref 0.7–4.0)
MCHC: 34 g/dL (ref 30.0–36.0)
MCV: 91.6 fl (ref 78.0–100.0)
Monocytes Absolute: 0.5 10*3/uL (ref 0.1–1.0)
Monocytes Relative: 7.8 % (ref 3.0–12.0)
Neutro Abs: 3.6 10*3/uL (ref 1.4–7.7)
Neutrophils Relative %: 55.4 % (ref 43.0–77.0)
Platelets: 223 10*3/uL (ref 150.0–400.0)
RBC: 4.74 Mil/uL (ref 4.22–5.81)
RDW: 13.4 % (ref 11.5–14.6)
WBC: 6.5 10*3/uL (ref 4.5–10.5)

## 2012-10-06 LAB — HEPATIC FUNCTION PANEL
ALT: 29 U/L (ref 0–53)
AST: 19 U/L (ref 0–37)
Albumin: 4.3 g/dL (ref 3.5–5.2)
Alkaline Phosphatase: 76 U/L (ref 39–117)
Bilirubin, Direct: 0 mg/dL (ref 0.0–0.3)
Total Bilirubin: 1.1 mg/dL (ref 0.3–1.2)
Total Protein: 7.6 g/dL (ref 6.0–8.3)

## 2012-10-06 LAB — TSH: TSH: 1.07 u[IU]/mL (ref 0.35–5.50)

## 2012-10-06 NOTE — Assessment & Plan Note (Signed)
On enbrel Will check labs

## 2012-10-06 NOTE — Assessment & Plan Note (Signed)
Needs to do some exercise Will defer PSA to next year

## 2012-10-06 NOTE — Patient Instructions (Addendum)
Please try reducing candies and using lactaid milk instead of regular.  DASH Diet The DASH diet stands for "Dietary Approaches to Stop Hypertension." It is a healthy eating plan that has been shown to reduce high blood pressure (hypertension) in as little as 14 days, while also possibly providing other significant health benefits. These other health benefits include reducing the risk of breast cancer after menopause and reducing the risk of type 2 diabetes, heart disease, colon cancer, and stroke. Health benefits also include weight loss and slowing kidney failure in patients with chronic kidney disease.  DIET GUIDELINES  Limit salt (sodium). Your diet should contain less than 1500 mg of sodium daily.  Limit refined or processed carbohydrates. Your diet should include mostly whole grains. Desserts and added sugars should be used sparingly.  Include small amounts of heart-healthy fats. These types of fats include nuts, oils, and tub margarine. Limit saturated and trans fats. These fats have been shown to be harmful in the body. CHOOSING FOODS  The following food groups are based on a 2000 calorie diet. See your Registered Dietitian for individual calorie needs. Grains and Grain Products (6 to 8 servings daily)  Eat More Often: Whole-wheat bread, brown rice, whole-grain or wheat pasta, quinoa, popcorn without added fat or salt (air popped).  Eat Less Often: White bread, white pasta, white rice, cornbread. Vegetables (4 to 5 servings daily)  Eat More Often: Fresh, frozen, and canned vegetables. Vegetables may be raw, steamed, roasted, or grilled with a minimal amount of fat.  Eat Less Often/Avoid: Creamed or fried vegetables. Vegetables in a cheese sauce. Fruit (4 to 5 servings daily)  Eat More Often: All fresh, canned (in natural juice), or frozen fruits. Dried fruits without added sugar. One hundred percent fruit juice ( cup [237 mL] daily).  Eat Less Often: Dried fruits with added sugar.  Canned fruit in light or heavy syrup. [employer], Fish, and Poultry (2 servings or less daily. One serving is 3 to 4 oz [85-114 g]).  Eat More Often: Ninety percent or leaner ground beef, tenderloin, sirloin. Round cuts of beef, chicken breast, [place] breast. All fish. Grill, bake, or broil your meat. Nothing should be fried.  Eat Less Often/Avoid: Fatty cuts of meat, [place], or chicken leg, thigh, or wing. Fried cuts of meat or fish. Dairy (2 to 3 servings)  Eat More Often: Low-fat or fat-free milk, low-fat plain or light yogurt, reduced-fat or part-skim cheese.  Eat Less Often/Avoid: Milk (whole, 2%).Whole milk yogurt. Full-fat cheeses. Nuts, Seeds, and Legumes (4 to 5 servings per week)  Eat More Often: All without added salt.  Eat Less Often/Avoid: Salted nuts and seeds, canned beans with added salt. Fats and Sweets (limited)  Eat More Often: Vegetable oils, tub margarines without trans fats, sugar-free gelatin. Mayonnaise and salad dressings.  Eat Less Often/Avoid: Coconut oils, palm oils, butter, stick margarine, cream, half and half, cookies, candy, pie. FOR MORE INFORMATION The Dash Diet Eating Plan: www.dashdiet.org Document Released: 06/13/2011 Document Revised: 09/16/2011 Document Reviewed: 06/13/2011 [facility] Patient Information 2013 [place], [place].

## 2012-10-06 NOTE — Assessment & Plan Note (Signed)
Has had some burping but rare reflux Will check labs Discussed changing diet

## 2012-10-06 NOTE — Progress Notes (Signed)
Subjective:    Patient ID: Cory [name], male    DOB: April 21, 1956, 57 y.o.   MRN: [id number]  HPI Here for physical  Has been burping a lot in past month No sig heartburn or water brash Some fever blisters in mouth Has increased drinking chocolate milk Some candies--sugarless Bowels are fine  Has been out of work lately--no work Did have temporary job in [place] recently and works some on the side Hopes to get back to [employer] hopefully in [place]  Still on enbrel from Dr Orson Aloe Checks labs regularly---but not in a while  Current Outpatient Prescriptions on File Prior to Visit  Medication Sig Dispense Refill  . ENBREL 50 MG/ML injection 50 mg once a week.        No current facility-administered medications on file prior to visit.    No Known Allergies  Past Medical History  Diagnosis Date  . Psoriasis   . Pancreatitis, acute   . Personal history of colonic polyps     [facility]  . GERD (gastroesophageal reflux disease)   . Nephrolithiasis     [facility]  . Hyperlipidemia     Past Surgical History  Procedure Laterality Date  . Shoulder surgery    . Groin surgery      as a child    Family History  Problem Relation Age of Onset  . Colon cancer Neg Hx   . Stomach cancer Mother   . Cancer Mother     stomach cancer  . Diabetes Brother   . Hypertension Brother   . Cancer Father     prostate cancer  . Hypertension Father   . Cancer Sister     colon cancer    History   Social History  . Marital Status: Married    Spouse Name: N/A    Number of Children: 2  . Years of Education: N/A   Occupational History  . textile    Social History Main Topics  . Smoking status: Former Smoker    Types: Cigarettes    Quit date: 07/08/1988  . Smokeless tobacco: Never Used  . Alcohol Use: No     Comment: recovering alcoholic for 13 years  . Drug Use: No  . Sexually Active: Not on file   Other Topics Concern  . Not on file    Social History Narrative  . No narrative on file   Review of Systems  Constitutional: Negative for fatigue and unexpected weight change.       Still doesn't exercise Wears seat  belt  HENT: Positive for congestion, rhinorrhea and tinnitus. Negative for dental problem.        Mild tinnitus on left Has full dentures  Eyes: Negative for visual disturbance.       No diplopia or unilateral vision loss. Did recently see eye doctor  Respiratory: Negative for cough, chest tightness and shortness of breath.   Cardiovascular: Negative for chest pain, palpitations and leg swelling.       Occ left chest pain up near shoulder---related to gas. No exertional symptoms  Gastrointestinal: Negative for nausea, vomiting, abdominal pain, constipation and blood in stool.       Rare heartburn---rolaids (once a month or so)  Endocrine: Negative for cold intolerance and heat intolerance.  Genitourinary: Negative for urgency, frequency and difficulty urinating.       No sexual problems  Musculoskeletal: Negative for back pain, joint swelling and arthralgias.  Skin: Positive for rash.  No suspicious lesions Psoriasis controlled  Allergic/Immunologic: Positive for environmental allergies. Negative for immunocompromised state.       Satisfied with OTC meds for this  Neurological: Positive for dizziness. Negative for syncope, weakness, light-headedness, numbness and headaches.       Occasional AM dizziness--if he gets up quick. Goes away quickly  Hematological: Negative for adenopathy. Does not bruise/bleed easily.  Psychiatric/Behavioral: Negative for sleep disturbance and dysphoric mood. The patient is not nervous/anxious.        Objective:   Physical Exam  Constitutional: He is oriented to person, place, and time. He appears well-developed and well-nourished. No distress.  HENT:  Head: Normocephalic and atraumatic.  Right Ear: External ear normal.  Left Ear: External ear normal.   Mouth/Throat: Oropharynx is clear and moist. No oropharyngeal exudate.  Eyes: Conjunctivae and EOM are normal. Pupils are equal, round, and reactive to light.  Neck: Normal range of motion. Neck supple. No thyromegaly present.  Cardiovascular: Normal rate, regular rhythm, normal heart sounds and intact distal pulses.  Exam reveals no gallop.   No murmur heard. Pulmonary/Chest: Effort normal and breath sounds normal. No respiratory distress. He has no wheezes. He has no rales.  Abdominal: Soft. There is no tenderness.  Musculoskeletal: He exhibits no edema and no tenderness.  Lymphadenopathy:    He has no cervical adenopathy.  Neurological: He is alert and oriented to person, place, and time.  Skin: Rash noted. No erythema.  Scattered mild psoriatic areas on arms  Psychiatric: He has a normal mood and affect. His behavior is normal.          Assessment & Plan:

## 2012-10-07 ENCOUNTER — Encounter: Payer: Self-pay | Admitting: *Deleted

## 2013-01-29 ENCOUNTER — Encounter: Payer: 59 | Admitting: Internal Medicine

## 2013-05-13 ENCOUNTER — Other Ambulatory Visit: Payer: Self-pay

## 2013-06-10 ENCOUNTER — Ambulatory Visit (INDEPENDENT_AMBULATORY_CARE_PROVIDER_SITE_OTHER): Payer: 59 | Admitting: Adult Health

## 2013-06-10 ENCOUNTER — Encounter: Payer: Self-pay | Admitting: Adult Health

## 2013-06-10 VITALS — BP 126/78 | HR 64 | Temp 98.1°F | Resp 14 | Wt 232.0 lb

## 2013-06-10 DIAGNOSIS — J209 Acute bronchitis, unspecified: Secondary | ICD-10-CM | POA: Insufficient documentation

## 2013-06-10 MED ORDER — AZITHROMYCIN 250 MG PO TABS: ORAL_TABLET | ORAL | Status: DC

## 2013-06-10 NOTE — Assessment & Plan Note (Signed)
Start azithromycin. Drink plenty of fluids to maintain hydration. Over-the-counter medication for cough. RTC if symptoms are not improved within 3-4 days.

## 2013-06-10 NOTE — Patient Instructions (Signed)
  Start Azithromycin as directed.  Take over the counter cough medication.  Saline spray as needed to hydrate and irrigate the sinuses.  If symptoms are not improved within 3-4 days please call the office.

## 2013-06-10 NOTE — Progress Notes (Signed)
Pre visit review using our clinic review tool, if applicable. No additional management support is needed unless otherwise documented below in the visit note. 

## 2013-06-10 NOTE — Progress Notes (Signed)
   Subjective:    Patient ID: Cory [name], male    DOB: July 08, 1956, 57 y.o.   MRN: [id number]  HPI Patient is a 57 year old male who presents to clinic with complaints of cough and chest congestion that has been ongoing for greater than 2 weeks. He works out of town and felt he needed to be evaluated prior to going back out this weekend. He has taken mucinex and an otc cough medicine. Symptoms persists.   Current Outpatient Prescriptions on File Prior to Visit  Medication Sig Dispense Refill  . ENBREL 50 MG/ML injection 50 mg once a week.        No current facility-administered medications on file prior to visit.      Review of Systems  Constitutional: Negative for fever and chills.  HENT: Positive for congestion, postnasal drip, sinus pressure and sore throat.   Respiratory: Positive for cough and chest tightness. Negative for wheezing.   Cardiovascular: Negative.        Objective:   Physical Exam  Constitutional: He is oriented to person, place, and time. No distress.  HENT:  Pharyngeal erythema. Drainage  Eyes: No scleral icterus.  Cardiovascular: Normal rate, regular rhythm and normal heart sounds.  Exam reveals no gallop.   No murmur heard. Pulmonary/Chest: Effort normal and breath sounds normal. No respiratory distress. He has no wheezes. He has no rales.  Lymphadenopathy:    He has no cervical adenopathy.  Neurological: He is alert and oriented to person, place, and time.    BP 126/78  Pulse 64  Temp(Src) 98.1 F (36.7 C) (Oral)  Resp 14  Wt 232 lb (105.235 kg)  SpO2 98%       Assessment & Plan:

## 2013-09-03 ENCOUNTER — Ambulatory Visit: Payer: 59 | Admitting: Internal Medicine

## 2013-09-07 ENCOUNTER — Ambulatory Visit: Payer: 59 | Admitting: Internal Medicine

## 2013-09-09 ENCOUNTER — Ambulatory Visit: Payer: 59 | Admitting: Internal Medicine

## 2013-09-20 ENCOUNTER — Encounter: Payer: Self-pay | Admitting: Internal Medicine

## 2013-09-20 ENCOUNTER — Ambulatory Visit (INDEPENDENT_AMBULATORY_CARE_PROVIDER_SITE_OTHER): Payer: 59 | Admitting: Internal Medicine

## 2013-09-20 VITALS — BP 142/86 | HR 64 | Temp 97.8°F | Wt 226.2 lb

## 2013-09-20 DIAGNOSIS — N529 Male erectile dysfunction, unspecified: Secondary | ICD-10-CM

## 2013-09-20 MED ORDER — SILDENAFIL CITRATE 20 MG PO TABS: 60.0000 mg | ORAL_TABLET | Freq: Every day | ORAL | Status: DC | PRN

## 2013-09-20 NOTE — Patient Instructions (Signed)
DASH Diet  The DASH diet stands for "Dietary Approaches to Stop Hypertension." It is a healthy eating plan that has been shown to reduce high blood pressure (hypertension) in as little as 14 days, while also possibly providing other significant health benefits. These other health benefits include reducing the risk of breast cancer after menopause and reducing the risk of type 2 diabetes, heart disease, colon cancer, and stroke. Health benefits also include weight loss and slowing kidney failure in patients with chronic kidney disease.   DIET GUIDELINES  · Limit salt (sodium). Your diet should contain less than 1500 mg of sodium daily.  · Limit refined or processed carbohydrates. Your diet should include mostly whole grains. Desserts and added sugars should be used sparingly.  · Include small amounts of heart-healthy fats. These types of fats include nuts, oils, and tub margarine. Limit saturated and trans fats. These fats have been shown to be harmful in the body.  CHOOSING FOODS   The following food groups are based on a 2000 calorie diet. See your Registered Dietitian for individual calorie needs.  Grains and Grain Products (6 to 8 servings daily)  · Eat More Often: Whole-wheat bread, brown rice, whole-grain or wheat pasta, quinoa, popcorn without added fat or salt (air popped).  · Eat Less Often: White bread, white pasta, white rice, cornbread.  Vegetables (4 to 5 servings daily)  · Eat More Often: Fresh, frozen, and canned vegetables. Vegetables may be raw, steamed, roasted, or grilled with a minimal amount of fat.  · Eat Less Often/Avoid: Creamed or fried vegetables. Vegetables in a cheese sauce.  Fruit (4 to 5 servings daily)  · Eat More Often: All fresh, canned (in natural juice), or frozen fruits. Dried fruits without added sugar. One hundred percent fruit juice (½ cup [237 mL] daily).  · Eat Less Often: Dried fruits with added sugar. Canned fruit in light or heavy syrup.  Lean Meats, Fish, and Poultry (2  servings or less daily. One serving is 3 to 4 oz [85-114 g]).  · Eat More Often: Ninety percent or leaner ground beef, tenderloin, sirloin. Round cuts of beef, chicken breast, turkey breast. All fish. Grill, bake, or broil your meat. Nothing should be fried.  · Eat Less Often/Avoid: Fatty cuts of meat, turkey, or chicken leg, thigh, or wing. Fried cuts of meat or fish.  Dairy (2 to 3 servings)  · Eat More Often: Low-fat or fat-free milk, low-fat plain or light yogurt, reduced-fat or part-skim cheese.  · Eat Less Often/Avoid: Milk (whole, 2%). Whole milk yogurt. Full-fat cheeses.  Nuts, Seeds, and Legumes (4 to 5 servings per week)  · Eat More Often: All without added salt.  · Eat Less Often/Avoid: Salted nuts and seeds, canned beans with added salt.  Fats and Sweets (limited)  · Eat More Often: Vegetable oils, tub margarines without trans fats, sugar-free gelatin. Mayonnaise and salad dressings.  · Eat Less Often/Avoid: Coconut oils, palm oils, butter, stick margarine, cream, half and half, cookies, candy, pie.  FOR MORE INFORMATION  The Dash Diet Eating Plan: www.dashdiet.org  Document Released: 06/13/2011 Document Revised: 09/16/2011 Document Reviewed: 06/13/2011  [employer]® Patient Information ©2014 [employer].

## 2013-09-20 NOTE — Progress Notes (Signed)
Pre visit review using our clinic review tool, if applicable. No additional management support is needed unless otherwise documented below in the visit note. 

## 2013-09-20 NOTE — Progress Notes (Signed)
   Subjective:    Patient ID: Cory [name], male    DOB: Aug 23, 1955, 58 y.o.   MRN: [id number]  HPI Reviewed his labs from Dr Charolett Bumpers Potassium is 5.4---no meds to cause a problem so no action needed now  Had fasting glucose of 121 Discussed aerobic exercise etc  Has been having ED problems for 3-4 weeks On review, this has been coming on for longer No longer able to maintain it for penetration  Interested in trying medication  Current Outpatient Prescriptions on File Prior to Visit  Medication Sig Dispense Refill  . ENBREL 50 MG/ML injection 50 mg once a week.        No current facility-administered medications on file prior to visit.    No Known Allergies  Past Medical History  Diagnosis Date  . Psoriasis   . Pancreatitis, acute   . Personal history of colonic polyps     [facility]  . GERD (gastroesophageal reflux disease)   . Nephrolithiasis     [facility]    Past Surgical History  Procedure Laterality Date  . Shoulder surgery    . Groin surgery      as a child    Family History  Problem Relation Age of Onset  . Colon cancer Neg Hx   . Stomach cancer Mother   . Cancer Mother     stomach cancer  . Diabetes Brother   . Hypertension Brother   . Cancer Father     prostate cancer  . Hypertension Father   . Cancer Sister     colon cancer    History   Social History  . Marital Status: Married    Spouse Name: N/A    Number of Children: 2  . Years of Education: N/A   Occupational History  . textile    Social History Main Topics  . Smoking status: Former Smoker    Types: Cigarettes    Quit date: 07/08/1988  . Smokeless tobacco: Never Used  . Alcohol Use: No     Comment: recovering alcoholic for 13 years  . Drug Use: No  . Sexual Activity: Not on file   Other Topics Concern  . Not on file   Social History Narrative  . No narrative on file   Review of Systems Sleeps well Weight fairly stable No marital problems      Objective:   Physical Exam  Psychiatric: He has a normal mood and affect. His behavior is normal.          Assessment & Plan:

## 2013-09-20 NOTE — Assessment & Plan Note (Addendum)
No worrisome features Will try generic sildenafil Follow up at his next physical  15 minute visit with care planning and counseling for entire time basically

## 2013-10-08 ENCOUNTER — Encounter: Payer: 59 | Admitting: Internal Medicine

## 2013-10-28 ENCOUNTER — Encounter: Payer: 59 | Admitting: Internal Medicine

## 2014-02-11 ENCOUNTER — Encounter: Payer: Self-pay | Admitting: Gastroenterology

## 2014-02-11 ENCOUNTER — Ambulatory Visit (INDEPENDENT_AMBULATORY_CARE_PROVIDER_SITE_OTHER): Payer: 59 | Admitting: Internal Medicine

## 2014-02-11 ENCOUNTER — Encounter: Payer: Self-pay | Admitting: Internal Medicine

## 2014-02-11 VITALS — BP 140/70 | HR 60 | Temp 98.4°F | Ht 71.5 in | Wt 219.0 lb

## 2014-02-11 DIAGNOSIS — Z1211 Encounter for screening for malignant neoplasm of colon: Secondary | ICD-10-CM

## 2014-02-11 DIAGNOSIS — L408 Other psoriasis: Secondary | ICD-10-CM

## 2014-02-11 DIAGNOSIS — R7309 Other abnormal glucose: Secondary | ICD-10-CM

## 2014-02-11 DIAGNOSIS — L409 Psoriasis, unspecified: Secondary | ICD-10-CM

## 2014-02-11 DIAGNOSIS — Z0000 Encounter for general adult medical examination without abnormal findings: Secondary | ICD-10-CM

## 2014-02-11 LAB — HEMOGLOBIN A1C: Hgb A1c MFr Bld: 5.4 % (ref 4.6–6.5)

## 2014-02-11 LAB — GLUCOSE, RANDOM: Glucose, Bld: 103 mg/dL — ABNORMAL HIGH (ref 70–99)

## 2014-02-11 NOTE — Assessment & Plan Note (Signed)
Good control on embrel

## 2014-02-11 NOTE — Assessment & Plan Note (Signed)
Generally healthy Info on healthy lifestyle Screening colonoscopy due Discussed PSA---he prefers not to do this now

## 2014-02-11 NOTE — Patient Instructions (Signed)
Exercise to Lose Weight Exercise and a healthy diet may help you lose weight. Your doctor may suggest specific exercises. EXERCISE IDEAS AND TIPS  Choose low-cost things you enjoy doing, such as walking, bicycling, or exercising to workout videos.  Take stairs instead of the elevator.  Walk during your lunch break.  Park your car further away from work or school.  Go to a gym or an exercise class.  Start with 5 to 10 minutes of exercise each day. Build up to 30 minutes of exercise 4 to 6 days a week.  Wear shoes with good support and comfortable clothes.  Stretch before and after working out.  Work out until you breathe harder and your heart beats faster.  Drink extra water when you exercise.  Do not do so much that you hurt yourself, feel dizzy, or get very short of breath. Exercises that burn about 150 calories:  Running 1  miles in 15 minutes.  Playing volleyball for 45 to 60 minutes.  Washing and waxing a car for 45 to 60 minutes.  Playing touch football for 45 minutes.  Walking 1  miles in 35 minutes.  Pushing a stroller 1  miles in 30 minutes.  Playing basketball for 30 minutes.  Raking leaves for 30 minutes.  Bicycling 5 miles in 30 minutes.  Walking 2 miles in 30 minutes.  Dancing for 30 minutes.  Shoveling snow for 15 minutes.  Swimming laps for 20 minutes.  Walking up stairs for 15 minutes.  Bicycling 4 miles in 15 minutes.  Gardening for 30 to 45 minutes.  Jumping rope for 15 minutes.  Washing windows or floors for 45 to 60 minutes. Document Released: 07/27/2010 Document Revised: 09/16/2011 Document Reviewed: 07/27/2010 ExitCare Patient Information 2015 [employer]. This information is not intended to replace advice given to you by your health care provider. Make sure you discuss any questions you have with your health care provider. DASH Eating Plan DASH stands for "Dietary Approaches to Stop Hypertension." The DASH eating plan is a  healthy eating plan that has been shown to reduce high blood pressure (hypertension). Additional health benefits may include reducing the risk of type 2 diabetes mellitus, heart disease, and stroke. The DASH eating plan may also help with weight loss. WHAT DO I NEED TO KNOW ABOUT THE DASH EATING PLAN? For the DASH eating plan, you will follow these general guidelines:  Choose foods with a percent daily value for sodium of less than 5% (as listed on the food label).  Use salt-free seasonings or herbs instead of table salt or sea salt.  Check with your health care provider or pharmacist before using salt substitutes.  Eat lower-sodium products, often labeled as "lower sodium" or "no salt added."  Eat fresh foods.  Eat more vegetables, fruits, and low-fat dairy products.  Choose whole grains. Look for the word "whole" as the first word in the ingredient list.  Choose fish and skinless chicken or turkey more often than red meat. Limit fish, poultry, and meat to 6 oz (170 g) each day.  Limit sweets, desserts, sugars, and sugary drinks.  Choose heart-healthy fats.  Limit cheese to 1 oz (28 g) per day.  Eat more home-cooked food and less restaurant, buffet, and fast food.  Limit fried foods.  Cook foods using methods other than frying.  Limit canned vegetables. If you do use them, rinse them well to decrease the sodium.  When eating at a restaurant, ask that your food be prepared with   less salt, or no salt if possible. WHAT FOODS CAN I EAT? Seek help from a dietitian for individual calorie needs. Grains Whole grain or whole wheat bread. Brown rice. Whole grain or whole wheat pasta. Quinoa, bulgur, and whole grain cereals. Low-sodium cereals. Corn or whole wheat flour tortillas. Whole grain cornbread. Whole grain crackers. Low-sodium crackers. Vegetables Fresh or frozen vegetables (raw, steamed, roasted, or grilled). Low-sodium or reduced-sodium tomato and vegetable juices. Low-sodium  or reduced-sodium tomato sauce and paste. Low-sodium or reduced-sodium canned vegetables.  Fruits All fresh, canned (in natural juice), or frozen fruits. Meat and Other Protein Products Ground beef (85% or leaner), grass-fed beef, or beef trimmed of fat. Skinless chicken or turkey. Ground chicken or turkey. Pork trimmed of fat. All fish and seafood. Eggs. Dried beans, peas, or lentils. Unsalted nuts and seeds. Unsalted canned beans. Dairy Low-fat dairy products, such as skim or 1% milk, 2% or reduced-fat cheeses, low-fat ricotta or cottage cheese, or plain low-fat yogurt. Low-sodium or reduced-sodium cheeses. Fats and Oils Tub margarines without trans fats. Light or reduced-fat mayonnaise and salad dressings (reduced sodium). Avocado. Safflower, olive, or canola oils. Natural peanut or almond butter. Other Unsalted popcorn and pretzels. The items listed above may not be a complete list of recommended foods or beverages. Contact your dietitian for more options. WHAT FOODS ARE NOT RECOMMENDED? Grains White bread. White pasta. White rice. Refined cornbread. Bagels and croissants. Crackers that contain trans fat. Vegetables Creamed or fried vegetables. Vegetables in a cheese sauce. Regular canned vegetables. Regular canned tomato sauce and paste. Regular tomato and vegetable juices. Fruits Dried fruits. Canned fruit in light or heavy syrup. Fruit juice. Meat and Other Protein Products Fatty cuts of meat. Ribs, chicken wings, bacon, sausage, bologna, salami, chitterlings, fatback, hot dogs, bratwurst, and packaged luncheon meats. Salted nuts and seeds. Canned beans with salt. Dairy Whole or 2% milk, cream, half-and-half, and cream cheese. Whole-fat or sweetened yogurt. Full-fat cheeses or blue cheese. Nondairy creamers and whipped toppings. Processed cheese, cheese spreads, or cheese curds. Condiments Onion and garlic salt, seasoned salt, table salt, and sea salt. Canned and packaged gravies.  Worcestershire sauce. Tartar sauce. Barbecue sauce. Teriyaki sauce. Soy sauce, including reduced sodium. Steak sauce. Fish sauce. Oyster sauce. Cocktail sauce. Horseradish. Ketchup and mustard. Meat flavorings and tenderizers. Bouillon cubes. Hot sauce. Tabasco sauce. Marinades. Taco seasonings. Relishes. Fats and Oils Butter, stick margarine, lard, shortening, ghee, and bacon fat. Coconut, palm kernel, or palm oils. Regular salad dressings. Other Pickles and olives. Salted popcorn and pretzels. The items listed above may not be a complete list of foods and beverages to avoid. Contact your dietitian for more information. WHERE CAN I FIND MORE INFORMATION? National Heart, Lung, and Blood Institute: www.nhlbi.nih.gov/health/health-topics/topics/dash/ Document Released: 06/13/2011 Document Revised: 11/08/2013 Document Reviewed: 04/28/2013 ExitCare Patient Information 2015 [employer]. This information is not intended to replace advice given to you by your health care provider. Make sure you discuss any questions you have with your health care provider.  

## 2014-02-11 NOTE — Progress Notes (Signed)
Subjective:    Patient ID: Cory [name], male    DOB: 1955-11-03, 58 y.o.   MRN: [id number]  HPI Here for physical Doing well Has been doing job in [place] --still works in [employer] ([employer]). Almost done with this job though  Didn't get the cialis Feels his ED is better  Still follows with Dr Koleen Nimrod for his psoriasis On embrel still  Doing some exercise--working on abs Walks a lot at job  Current Outpatient Prescriptions on File Prior to Visit  Medication Sig Dispense Refill  . ENBREL 50 MG/ML injection 50 mg once a week.        No current facility-administered medications on file prior to visit.    No Known Allergies  Past Medical History  Diagnosis Date  . Psoriasis   . Pancreatitis, acute   . Personal history of colonic polyps     Dr Nicolasa Ducking  . GERD (gastroesophageal reflux disease)   . Nephrolithiasis     [facility]    Past Surgical History  Procedure Laterality Date  . Shoulder surgery    . Groin surgery      as a child    Family History  Problem Relation Age of Onset  . Colon cancer Neg Hx   . Stomach cancer Mother   . Cancer Mother     stomach cancer  . Diabetes Brother   . Hypertension Brother   . Cancer Father     prostate cancer  . Hypertension Father   . Cancer Sister     colon cancer    History   Social History  . Marital Status: Married    Spouse Name: N/A    Number of Children: 2  . Years of Education: N/A   Occupational History  . 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 Smoking status: Former Smoker    Types: Cigarettes    Quit date: 07/08/1988  . Smokeless tobacco: Never Used  . Alcohol Use: No     Comment: recovering alcoholic for 13 years  . Drug Use: No  . Sexual Activity: Not on file   Other Topics Concern  . Not on file   Social History Narrative  . No narrative on file   Review of Systems  Constitutional: Negative for fatigue and unexpected weight  change.       Remains abstinent from alcohol for the most part Wears seat belt  HENT: Negative for dental problem, hearing loss and tinnitus.        Full dentures  Eyes: Negative for visual disturbance.       No diplopia or unilateral vision loss  Respiratory: Positive for cough and shortness of breath. Negative for chest tightness.        Occ cough Stable DOE  Cardiovascular: Positive for chest pain. Negative for palpitations and leg swelling.       Occ chest pain he thinks is gas--usually after eating (quickly resolves) No exertional symptoms  Gastrointestinal: Negative for nausea, vomiting, abdominal pain, constipation and blood in stool.       Rare heartburn--has used tums in past  Endocrine: Negative for cold intolerance and heat intolerance.  Genitourinary: Negative for urgency and difficulty urinating.  Musculoskeletal: Negative for arthralgias, gait problem and joint swelling.  Skin: Positive for rash.  Allergic/Immunologic: Positive for environmental allergies. Negative for immunocompromised state.       Uses OTC meds with success  Neurological: Positive  for headaches. Negative for dizziness, syncope, weakness, light-headedness and numbness.       Rare headaches  Hematological: Negative for adenopathy. Does not bruise/bleed easily.  Psychiatric/Behavioral: Negative for sleep disturbance and dysphoric mood. The patient is not nervous/anxious.        Objective:   Physical Exam  Constitutional: He is oriented to person, place, and time. He appears well-developed and well-nourished. No distress.  HENT:  Head: Normocephalic and atraumatic.  Right Ear: External ear normal.  Left Ear: External ear normal.  Mouth/Throat: Oropharynx is clear and moist. No oropharyngeal exudate.  Eyes: Conjunctivae and EOM are normal. Pupils are equal, round, and reactive to light.  Neck: Normal range of motion. Neck supple. No thyromegaly present.  Cardiovascular: Normal rate, regular rhythm,  normal heart sounds and intact distal pulses.  Exam reveals no gallop.   No murmur heard. Pulmonary/Chest: Effort normal and breath sounds normal. No respiratory distress. He has no wheezes. He has no rales.  Abdominal: Soft. There is no tenderness.  Musculoskeletal: He exhibits no edema and no tenderness.  Lymphadenopathy:    He has no cervical adenopathy.  Neurological: He is alert and oriented to person, place, and time.  Skin:  Scattered small psoriatic patches  Psychiatric: He has a normal mood and affect. His behavior is normal.          Assessment & Plan:

## 2014-02-11 NOTE — Progress Notes (Signed)
Pre visit review using our clinic review tool, if applicable. No additional management support is needed unless otherwise documented below in the visit note. 

## 2014-04-08 ENCOUNTER — Encounter: Payer: 59 | Admitting: Gastroenterology

## 2015-02-17 ENCOUNTER — Encounter: Payer: 59 | Admitting: Internal Medicine

## 2016-09-09 DIAGNOSIS — L400 Psoriasis vulgaris: Secondary | ICD-10-CM | POA: Diagnosis not present

## 2016-09-09 DIAGNOSIS — Z5181 Encounter for therapeutic drug level monitoring: Secondary | ICD-10-CM | POA: Diagnosis not present

## 2016-09-10 DIAGNOSIS — L400 Psoriasis vulgaris: Secondary | ICD-10-CM | POA: Diagnosis not present

## 2017-01-02 ENCOUNTER — Other Ambulatory Visit: Payer: Self-pay | Admitting: Internal Medicine

## 2017-01-02 DIAGNOSIS — Z125 Encounter for screening for malignant neoplasm of prostate: Secondary | ICD-10-CM

## 2017-01-02 DIAGNOSIS — L409 Psoriasis, unspecified: Secondary | ICD-10-CM

## 2017-01-09 ENCOUNTER — Other Ambulatory Visit (INDEPENDENT_AMBULATORY_CARE_PROVIDER_SITE_OTHER): Payer: 59

## 2017-01-09 DIAGNOSIS — L409 Psoriasis, unspecified: Secondary | ICD-10-CM | POA: Diagnosis not present

## 2017-01-09 DIAGNOSIS — Z125 Encounter for screening for malignant neoplasm of prostate: Secondary | ICD-10-CM | POA: Diagnosis not present

## 2017-01-09 LAB — COMPREHENSIVE METABOLIC PANEL
ALT: 22 U/L (ref 0–53)
AST: 12 U/L (ref 0–37)
Albumin: 4.3 g/dL (ref 3.5–5.2)
Alkaline Phosphatase: 73 U/L (ref 39–117)
BUN: 15 mg/dL (ref 6–23)
CO2: 26 mEq/L (ref 19–32)
Calcium: 9.1 mg/dL (ref 8.4–10.5)
Chloride: 106 mEq/L (ref 96–112)
Creatinine, Ser: 0.84 mg/dL (ref 0.40–1.50)
GFR: 98.52 mL/min (ref >60.00)
Glucose, Bld: 111 mg/dL — ABNORMAL HIGH (ref 70–99)
Potassium: 4.4 mEq/L (ref 3.5–5.1)
Sodium: 139 mEq/L (ref 135–145)
Total Bilirubin: 0.6 mg/dL (ref 0.2–1.2)
Total Protein: 7 g/dL (ref 6.0–8.3)

## 2017-01-09 LAB — LIPID PANEL
Cholesterol: 156 mg/dL (ref 0–200)
HDL: 40.4 mg/dL (ref >39.00)
LDL Cholesterol: 103 mg/dL — ABNORMAL HIGH (ref 0–99)
NonHDL: 115.52
Total CHOL/HDL Ratio: 4
Triglycerides: 63 mg/dL (ref 0.0–149.0)
VLDL: 12.6 mg/dL (ref 0.0–40.0)

## 2017-01-09 LAB — CBC WITH DIFFERENTIAL/PLATELET
Basophils Absolute: 0 10*3/uL (ref 0.0–0.1)
Basophils Relative: 0.5 % (ref 0.0–3.0)
Eosinophils Absolute: 0.4 10*3/uL (ref 0.0–0.7)
Eosinophils Relative: 6.5 % — ABNORMAL HIGH (ref 0.0–5.0)
HCT: 43.4 % (ref 39.0–52.0)
Hemoglobin: 14.9 g/dL (ref 13.0–17.0)
Lymphocytes Relative: 42.1 % (ref 12.0–46.0)
Lymphs Abs: 2.6 10*3/uL (ref 0.7–4.0)
MCHC: 34.4 g/dL (ref 30.0–36.0)
MCV: 90.7 fl (ref 78.0–100.0)
Monocytes Absolute: 0.4 10*3/uL (ref 0.1–1.0)
Monocytes Relative: 6.6 % (ref 3.0–12.0)
Neutro Abs: 2.7 10*3/uL (ref 1.4–7.7)
Neutrophils Relative %: 44.3 % (ref 43.0–77.0)
Platelets: 188 10*3/uL (ref 150.0–400.0)
RBC: 4.78 Mil/uL (ref 4.22–5.81)
RDW: 13.4 % (ref 11.5–15.5)
WBC: 6.1 10*3/uL (ref 4.0–10.5)

## 2017-01-09 LAB — PSA: PSA: 0.8 ng/mL (ref 0.10–4.00)

## 2017-01-13 ENCOUNTER — Encounter: Payer: Self-pay | Admitting: Internal Medicine

## 2017-01-13 ENCOUNTER — Ambulatory Visit (INDEPENDENT_AMBULATORY_CARE_PROVIDER_SITE_OTHER): Payer: 59 | Admitting: Internal Medicine

## 2017-01-13 VITALS — BP 130/82 | HR 60 | Temp 97.8°F | Ht 69.75 in | Wt 221.0 lb

## 2017-01-13 DIAGNOSIS — Z23 Encounter for immunization: Secondary | ICD-10-CM

## 2017-01-13 DIAGNOSIS — L409 Psoriasis, unspecified: Secondary | ICD-10-CM

## 2017-01-13 DIAGNOSIS — Z1211 Encounter for screening for malignant neoplasm of colon: Secondary | ICD-10-CM

## 2017-01-13 DIAGNOSIS — Z0000 Encounter for general adult medical examination without abnormal findings: Secondary | ICD-10-CM

## 2017-01-13 NOTE — Addendum Note (Signed)
Addended by: Pilar Grammes on: 01/13/2017 03:46 PM   Modules accepted: Orders

## 2017-01-13 NOTE — Patient Instructions (Signed)
DASH Eating Plan DASH stands for "Dietary Approaches to Stop Hypertension." The DASH eating plan is a healthy eating plan that has been shown to reduce high blood pressure (hypertension). It may also reduce your risk for type 2 diabetes, heart disease, and stroke. The DASH eating plan may also help with weight loss. What are tips for following this plan? General guidelines  Avoid eating more than 2,300 mg (milligrams) of salt (sodium) a day. If you have hypertension, you may need to reduce your sodium intake to 1,500 mg a day.  Limit alcohol intake to no more than 1 drink a day for nonpregnant women and 2 drinks a day for men. One drink equals 12 oz of beer, 5 oz of wine, or 1 oz of hard liquor.  Work with your health care provider to maintain a healthy body weight or to lose weight. Ask what an ideal weight is for you.  Get at least 30 minutes of exercise that causes your heart to beat faster (aerobic exercise) most days of the week. Activities may include walking, swimming, or biking.  Work with your health care provider or diet and nutrition specialist (dietitian) to adjust your eating plan to your individual calorie needs. Reading food labels  Check food labels for the amount of sodium per serving. Choose foods with less than 5 percent of the Daily Value of sodium. Generally, foods with less than 300 mg of sodium per serving fit into this eating plan.  To find whole grains, look for the word "whole" as the first word in the ingredient list. Shopping  Buy products labeled as "low-sodium" or "no salt added."  Buy fresh foods. Avoid canned foods and premade or frozen meals. Cooking  Avoid adding salt when cooking. Use salt-free seasonings or herbs instead of table salt or sea salt. Check with your health care provider or pharmacist before using salt substitutes.  Do not fry foods. Cook foods using healthy methods such as baking, boiling, grilling, and broiling instead.  Cook with  heart-healthy oils, such as olive, canola, soybean, or sunflower oil. Meal planning   Eat a balanced diet that includes: ? 5 or more servings of fruits and vegetables each day. At each meal, try to fill half of your plate with fruits and vegetables. ? Up to 6-8 servings of whole grains each day. ? Less than 6 oz of lean meat, poultry, or fish each day. A 3-oz serving of meat is about the same size as a deck of cards. One egg equals 1 oz. ? 2 servings of low-fat dairy each day. ? A serving of nuts, seeds, or beans 5 times each week. ? Heart-healthy fats. Healthy fats called Omega-3 fatty acids are found in foods such as flaxseeds and coldwater fish, like sardines, salmon, and mackerel.  Limit how much you eat of the following: ? Canned or prepackaged foods. ? Food that is high in trans fat, such as fried foods. ? Food that is high in saturated fat, such as fatty meat. ? Sweets, desserts, sugary drinks, and other foods with added sugar. ? Full-fat dairy products.  Do not salt foods before eating.  Try to eat at least 2 vegetarian meals each week.  Eat more home-cooked food and less restaurant, buffet, and fast food.  When eating at a restaurant, ask that your food be prepared with less salt or no salt, if possible. What foods are recommended? The items listed may not be a complete list. Talk with your dietitian about what   dietary choices are best for you. Grains Whole-grain or whole-wheat bread. Whole-grain or whole-wheat pasta. Brown rice. Oatmeal. Quinoa. Bulgur. Whole-grain and low-sodium cereals. Pita bread. Low-fat, low-sodium crackers. Whole-wheat flour tortillas. Vegetables Fresh or frozen vegetables (raw, steamed, roasted, or grilled). Low-sodium or reduced-sodium tomato and vegetable juice. Low-sodium or reduced-sodium tomato sauce and tomato paste. Low-sodium or reduced-sodium canned vegetables. Fruits All fresh, dried, or frozen fruit. Canned fruit in natural juice (without  added sugar). Meat and other protein foods Skinless chicken or turkey. Ground chicken or turkey. Pork with fat trimmed off. Fish and seafood. Egg whites. Dried beans, peas, or lentils. Unsalted nuts, nut butters, and seeds. Unsalted canned beans. Lean cuts of beef with fat trimmed off. Low-sodium, lean deli meat. Dairy Low-fat (1%) or fat-free (skim) milk. Fat-free, low-fat, or reduced-fat cheeses. Nonfat, low-sodium ricotta or cottage cheese. Low-fat or nonfat yogurt. Low-fat, low-sodium cheese. Fats and oils Soft margarine without trans fats. Vegetable oil. Low-fat, reduced-fat, or light mayonnaise and salad dressings (reduced-sodium). Canola, safflower, olive, soybean, and sunflower oils. Avocado. Seasoning and other foods Herbs. Spices. Seasoning mixes without salt. Unsalted popcorn and pretzels. Fat-free sweets. What foods are not recommended? The items listed may not be a complete list. Talk with your dietitian about what dietary choices are best for you. Grains Baked goods made with fat, such as croissants, muffins, or some breads. Dry pasta or rice meal packs. Vegetables Creamed or fried vegetables. Vegetables in a cheese sauce. Regular canned vegetables (not low-sodium or reduced-sodium). Regular canned tomato sauce and paste (not low-sodium or reduced-sodium). Regular tomato and vegetable juice (not low-sodium or reduced-sodium). Pickles. Olives. Fruits Canned fruit in a light or heavy syrup. Fried fruit. Fruit in cream or butter sauce. Meat and other protein foods Fatty cuts of meat. Ribs. Fried meat. Bacon. Sausage. Bologna and other processed lunch meats. Salami. Fatback. Hotdogs. Bratwurst. Salted nuts and seeds. Canned beans with added salt. Canned or smoked fish. Whole eggs or egg yolks. Chicken or turkey with skin. Dairy Whole or 2% milk, cream, and half-and-half. Whole or full-fat cream cheese. Whole-fat or sweetened yogurt. Full-fat cheese. Nondairy creamers. Whipped toppings.  Processed cheese and cheese spreads. Fats and oils Butter. Stick margarine. Lard. Shortening. Ghee. Bacon fat. Tropical oils, such as coconut, palm kernel, or palm oil. Seasoning and other foods Salted popcorn and pretzels. Onion salt, garlic salt, seasoned salt, table salt, and sea salt. Worcestershire sauce. Tartar sauce. Barbecue sauce. Teriyaki sauce. Soy sauce, including reduced-sodium. Steak sauce. Canned and packaged gravies. Fish sauce. Oyster sauce. Cocktail sauce. Horseradish that you find on the shelf. Ketchup. Mustard. Meat flavorings and tenderizers. Bouillon cubes. Hot sauce and Tabasco sauce. Premade or packaged marinades. Premade or packaged taco seasonings. Relishes. Regular salad dressings. Where to find more information:  National Heart, Lung, and Blood Institute: www.nhlbi.nih.gov  American Heart Association: www.heart.org Summary  The DASH eating plan is a healthy eating plan that has been shown to reduce high blood pressure (hypertension). It may also reduce your risk for type 2 diabetes, heart disease, and stroke.  With the DASH eating plan, you should limit salt (sodium) intake to 2,300 mg a day. If you have hypertension, you may need to reduce your sodium intake to 1,500 mg a day.  When on the DASH eating plan, aim to eat more fresh fruits and vegetables, whole grains, lean proteins, low-fat dairy, and heart-healthy fats.  Work with your health care provider or diet and nutrition specialist (dietitian) to adjust your eating plan to your individual   calorie needs. This information is not intended to replace advice given to you by your health care provider. Make sure you discuss any questions you have with your health care provider. Document Released: 06/13/2011 Document Revised: 06/17/2016 Document Reviewed: 06/17/2016 Elsevier Interactive Patient Education  2017 Elsevier Inc.  

## 2017-01-13 NOTE — Progress Notes (Signed)
Subjective:    Patient ID: Cory [name], male    DOB: Mar 14, 1956, 61 y.o.   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 ENBREL 50 MG/ML injection 50 mg once a week.      No current facility-administered medications on file prior to visit.     No Known Allergies  Past Medical History:  Diagnosis Date  . GERD (gastroesophageal reflux disease)   . Nephrolithiasis    [facility]  . Pancreatitis, acute   . Personal history of colonic polyps    Dr Nicolasa Ducking  . Psoriasis     Past Surgical History:  Procedure Laterality Date  . groin surgery     as a child  . SHOULDER SURGERY      Family History  Problem Relation Age of Onset  . Colon cancer Neg Hx   . Stomach cancer Mother   . Cancer Mother        stomach cancer  . Diabetes Brother   . Hypertension Brother   . Cancer Father        prostate cancer  . Hypertension Father   . Cancer Sister        colon cancer    Social History   Social History  . Marital status: Married    Spouse name: N/A  . Number of children: 2  . Years of education: N/A   Occupational History  . 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 Smoking status: Former Smoker    Types: Cigarettes    Quit date: 07/08/1988  . Smokeless tobacco: Never Used  . Alcohol use No     Comment: recovering alcoholic for 13 years  . Drug use: No  . Sexual activity: Not on file   Other Topics Concern  . Not on file   Social History Narrative  . No narrative on file   Review of Systems  Constitutional: Positive for diaphoresis. Negative for fatigue and unexpected weight change.   Wears seat belt  HENT: Negative for hearing loss, tinnitus and trouble swallowing.        Full dentures  Eyes: Negative for visual disturbance.       No diplopia or unilateral vision loss  Respiratory: Negative for cough, chest tightness and shortness of breath.   Cardiovascular: Negative for chest pain, palpitations and leg swelling.  Gastrointestinal: Negative for blood in stool and constipation.       No heartburn   Endocrine: Negative for polydipsia and polyuria.  Genitourinary: Negative for difficulty urinating and urgency.       No sexual problems  Musculoskeletal: Negative for back pain and myalgias.       Left hip pain in past few weeks Notices it when he gets to bed Uses ibuprofen 600mg  once a day with relief  Skin:       Mild psoriasis---knees and elbows  Allergic/Immunologic: Positive for environmental allergies. Negative for immunocompromised state.       Occ OTC meds  Neurological: Positive for dizziness. Negative for syncope, light-headedness and headaches.       Brief orthostatic dizziness  Hematological: Negative for adenopathy. Does  not bruise/bleed easily.  Psychiatric/Behavioral: Negative for dysphoric mood and sleep disturbance. The patient is not nervous/anxious.        Objective:   Physical Exam  Constitutional: He is oriented to person, place, and time. He appears well-developed and well-nourished. No distress.  HENT:  Head: Normocephalic and atraumatic.  Right Ear: External ear normal.  Left Ear: External ear normal.  Mouth/Throat: Oropharynx is clear and moist. No oropharyngeal exudate.  Eyes: Conjunctivae are normal. Pupils are equal, round, and reactive to light.  Neck: Normal range of motion. No thyromegaly present.  Cardiovascular: Normal rate, regular rhythm, normal heart sounds and intact distal pulses.  Exam reveals no gallop.   No murmur heard. Pulmonary/Chest: Effort normal and breath sounds normal. No respiratory distress. He has no wheezes.  He has no rales.  Abdominal: Soft. There is no tenderness.  Musculoskeletal: He exhibits no edema or tenderness.  Lymphadenopathy:    He has no cervical adenopathy.  Neurological: He is alert and oriented to person, place, and time.  Skin:  Psoriasis on knees  Psychiatric: He has a normal mood and affect. His behavior is normal.          Assessment & Plan:

## 2017-01-13 NOTE — Assessment & Plan Note (Signed)
Healthy Discussed prediabetes and healthy eating, etc--hard when he travels PSA just normal Overdue for colon ---it was set up in 2015 but then had to travel. Will check FIT

## 2017-01-13 NOTE — Assessment & Plan Note (Signed)
Ding well with the enbrel

## 2017-01-27 ENCOUNTER — Other Ambulatory Visit (INDEPENDENT_AMBULATORY_CARE_PROVIDER_SITE_OTHER): Payer: 59

## 2017-01-27 DIAGNOSIS — Z1211 Encounter for screening for malignant neoplasm of colon: Secondary | ICD-10-CM | POA: Diagnosis not present

## 2017-01-27 LAB — FECAL OCCULT BLOOD, IMMUNOCHEMICAL: Fecal Occult Bld: NEGATIVE

## 2017-08-13 DIAGNOSIS — Z85828 Personal history of other malignant neoplasm of skin: Secondary | ICD-10-CM | POA: Diagnosis not present

## 2017-08-14 DIAGNOSIS — L400 Psoriasis vulgaris: Secondary | ICD-10-CM | POA: Diagnosis not present

## 2017-08-14 DIAGNOSIS — Z5181 Encounter for therapeutic drug level monitoring: Secondary | ICD-10-CM | POA: Diagnosis not present

## 2017-09-18 DIAGNOSIS — H903 Sensorineural hearing loss, bilateral: Secondary | ICD-10-CM | POA: Diagnosis not present

## 2017-09-18 DIAGNOSIS — H6123 Impacted cerumen, bilateral: Secondary | ICD-10-CM | POA: Diagnosis not present

## 2018-01-16 ENCOUNTER — Encounter: Payer: Self-pay | Admitting: Internal Medicine

## 2018-01-16 ENCOUNTER — Ambulatory Visit (INDEPENDENT_AMBULATORY_CARE_PROVIDER_SITE_OTHER): Payer: 59 | Admitting: Internal Medicine

## 2018-01-16 VITALS — BP 132/66 | HR 64 | Temp 97.4°F | Ht 69.75 in | Wt 214.0 lb

## 2018-01-16 DIAGNOSIS — L400 Psoriasis vulgaris: Secondary | ICD-10-CM | POA: Diagnosis not present

## 2018-01-16 DIAGNOSIS — K2100 Gastro-esophageal reflux disease with esophagitis, without bleeding: Secondary | ICD-10-CM

## 2018-01-16 DIAGNOSIS — L570 Actinic keratosis: Secondary | ICD-10-CM | POA: Diagnosis not present

## 2018-01-16 DIAGNOSIS — L409 Psoriasis, unspecified: Secondary | ICD-10-CM | POA: Diagnosis not present

## 2018-01-16 DIAGNOSIS — Z0000 Encounter for general adult medical examination without abnormal findings: Secondary | ICD-10-CM

## 2018-01-16 DIAGNOSIS — Z1211 Encounter for screening for malignant neoplasm of colon: Secondary | ICD-10-CM

## 2018-01-16 DIAGNOSIS — X32XXXA Exposure to sunlight, initial encounter: Secondary | ICD-10-CM | POA: Diagnosis not present

## 2018-01-16 LAB — COMPREHENSIVE METABOLIC PANEL
ALT: 25 U/L (ref 0–53)
AST: 16 U/L (ref 0–37)
Albumin: 4.3 g/dL (ref 3.5–5.2)
Alkaline Phosphatase: 78 U/L (ref 39–117)
BUN: 14 mg/dL (ref 6–23)
CO2: 29 mEq/L (ref 19–32)
Calcium: 9.5 mg/dL (ref 8.4–10.5)
Chloride: 104 mEq/L (ref 96–112)
Creatinine, Ser: 0.81 mg/dL (ref 0.40–1.50)
GFR: 102.4 mL/min (ref >60.00)
Glucose, Bld: 108 mg/dL — ABNORMAL HIGH (ref 70–99)
Potassium: 4.2 mEq/L (ref 3.5–5.1)
Sodium: 140 mEq/L (ref 135–145)
Total Bilirubin: 0.7 mg/dL (ref 0.2–1.2)
Total Protein: 7.2 g/dL (ref 6.0–8.3)

## 2018-01-16 LAB — CBC
HCT: 42.8 % (ref 39.0–52.0)
Hemoglobin: 14.5 g/dL (ref 13.0–17.0)
MCHC: 33.9 g/dL (ref 30.0–36.0)
MCV: 90.9 fl (ref 78.0–100.0)
Platelets: 231 10*3/uL (ref 150.0–400.0)
RBC: 4.71 Mil/uL (ref 4.22–5.81)
RDW: 13.6 % (ref 11.5–15.5)
WBC: 7.2 10*3/uL (ref 4.0–10.5)

## 2018-01-16 MED ORDER — OMEPRAZOLE 20 MG PO CPDR
20.0000 mg | DELAYED_RELEASE_CAPSULE | Freq: Every day | ORAL | 11 refills | Status: DC
Start: 2018-01-16 — End: 2018-01-19

## 2018-01-16 NOTE — Assessment & Plan Note (Signed)
Doing okay with new biologic

## 2018-01-16 NOTE — Progress Notes (Signed)
Subjective:    Patient ID: Cory [name], male    DOB: 08/20/1955, 62 y.o.   MRN: [id number]  HPI Here for physical  Has had some dysphagia Occasionally food seems to get stuck in throat--not consistent No regular heartburn  On a different med for psoriasis Dr Evorn Gong prescribes  Work has been inconsistent No plans for retirement at this point Does maintenance work on the side  Still likes his sweets Weight down a few pounds from last year Active at work---no set exercise  Current Outpatient Medications on File Prior to Visit  Medication Sig Dispense Refill  . TREMFYA 100 MG/ML SOSY Inject 1 Dose into the skin every 8 (eight) weeks.      No current facility-administered medications on file prior to visit.     No Known Allergies  Past Medical History:  Diagnosis Date  . GERD (gastroesophageal reflux disease)   . Nephrolithiasis    [facility]  . Pancreatitis, acute   . Personal history of colonic polyps    Dr Nicolasa Ducking  . Psoriasis     Past Surgical History:  Procedure Laterality Date  . groin surgery     as a child  . SHOULDER SURGERY      Family History  Problem Relation Age of Onset  . Colon cancer Neg Hx   . Stomach cancer Mother   . Cancer Mother        stomach cancer  . Diabetes Brother   . Hypertension Brother   . Cancer Father        prostate cancer  . Hypertension Father   . Cancer Sister        colon cancer    Social History   Socioeconomic History  . Marital status: Married    Spouse name: Not on file  . Number of children: 2  . Years of education: Not on file  . Highest education level: Not on file  Occupational History  . 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 Financial resource strain: Not on file  . Food insecurity:    Worry: Not on file    Inability: Not on file  . Transportation needs:    Medical: Not on file    Non-medical: Not on file  Tobacco Use  . Smoking status: Former Smoker     Types: Cigarettes    Last attempt to quit: 07/08/1988    Years since quitting: 29.5  . Smokeless tobacco: Never Used  Substance and Sexual Activity  . Alcohol use: No    Comment: recovering alcoholic for 13 years  . Drug use: No  . Sexual activity: Not on file  Lifestyle  . Physical activity:    Days per week: Not on file    Minutes per session: Not on file  . Stress: Not on file  Relationships  . Social connections:    Talks on phone: Not on file    Gets together: Not on file    Attends religious service: Not on file    Active member of club or organization: Not on file    Attends meetings of clubs or organizations: Not on file    Relationship status: Not on file  . Intimate partner violence:    Fear of current or ex partner: Not on file    Emotionally abused: Not on file    Physically abused: Not on file    Forced sexual activity: Not on file  Other  Topics Concern  . Not on file  Social History Narrative  . Not on file   Review of Systems  Constitutional: Negative for fatigue.       Wears seat belt  HENT: Positive for hearing loss. Negative for dental problem and tinnitus.        Full dentures  Eyes: Negative for visual disturbance.       No diplopia or unilateral vision loss  Respiratory: Negative for cough, chest tightness and shortness of breath.   Cardiovascular: Negative for chest pain, palpitations and leg swelling.  Gastrointestinal: Negative for abdominal pain, blood in stool and constipation.  Endocrine: Negative for polydipsia and polyuria.  Genitourinary: Negative for difficulty urinating and urgency.       No sexual problems  Musculoskeletal: Negative for arthralgias and joint swelling.       Some back pain--- muscle vs kidney stone  Skin:       Psoriasis controlled Did have skin cancer on forehead  Allergic/Immunologic: Negative for environmental allergies and immunocompromised state.  Neurological: Negative for syncope and headaches.       Rare  dizziness if he gets up quick  Hematological: Negative for adenopathy. Does not bruise/bleed easily.  Psychiatric/Behavioral: Negative for dysphoric mood and sleep disturbance. The patient is not nervous/anxious.        Objective:   Physical Exam  Constitutional: He is oriented to person, place, and time. He appears well-developed. No distress.  HENT:  Head: Normocephalic and atraumatic.  Right Ear: External ear normal.  Left Ear: External ear normal.  Mouth/Throat: Oropharynx is clear and moist. No oropharyngeal exudate.  Eyes: Pupils are equal, round, and reactive to light. Conjunctivae are normal.  Neck: No thyromegaly present.  Cardiovascular: Normal rate, regular rhythm, normal heart sounds and intact distal pulses. Exam reveals no gallop.  No murmur heard. Respiratory: Effort normal and breath sounds normal. No respiratory distress. He has no wheezes. He has no rales.  GI: Soft. There is no tenderness.  Musculoskeletal: He exhibits no edema or tenderness.  Lymphadenopathy:    He has no cervical adenopathy.  Neurological: He is alert and oriented to person, place, and time.  Skin:  Mild psoriatic rash on kneess  Psychiatric: He has a normal mood and affect. His behavior is normal.           Assessment & Plan:

## 2018-01-16 NOTE — Assessment & Plan Note (Signed)
Some dysphagia Will have him try PPI GI evaluation if not improved

## 2018-01-16 NOTE — Patient Instructions (Signed)
Please start the omeprazole daily for 2-4 weeks. If you no longer have any swallowing problems, you can stop again and use it if you start again with symptoms. If you have persistent problems, I would refer you to a GI specialist.

## 2018-01-16 NOTE — Assessment & Plan Note (Signed)
Discussed exercise Recommended yearly flu vaccine FIT Defer PSA till next year

## 2018-01-19 ENCOUNTER — Telehealth: Payer: Self-pay | Admitting: Internal Medicine

## 2018-01-19 MED ORDER — OMEPRAZOLE 20 MG PO CPDR: 20.0000 mg | DELAYED_RELEASE_CAPSULE | Freq: Every day | ORAL | 11 refills | Status: DC

## 2018-01-19 NOTE — Telephone Encounter (Signed)
I spoke with pts wife who gave me pts work # and I spoke with pt and he said he tried to pick up omeprazole on Sat and Sun and was advised no med was sent to [facility]. I called [facility] and spoke with Burman Nieves who said they do not have any meds listed for pt; pt said he has gotten meds there before. I spoke with  Estill Bamberg and gave verbal order for omeprazole 20 mg taking 1 cap po daily. Pt voiced understanding and will pick up later today.

## 2018-01-19 NOTE — Telephone Encounter (Signed)
Copied from [place] [id number]. Topic: Quick Communication - See Telephone Encounter >> Jan 19, 2018  7:35 AM Synthia Innocent wrote: CRM for notification. See Telephone encounter for: 01/19/18. Pharmacy did not receive refill on 01/16/18 of omeprazole (PRILOSEC) 20 MG capsule. [facility]

## 2018-01-30 ENCOUNTER — Other Ambulatory Visit (INDEPENDENT_AMBULATORY_CARE_PROVIDER_SITE_OTHER): Payer: 59

## 2018-01-30 DIAGNOSIS — Z1211 Encounter for screening for malignant neoplasm of colon: Secondary | ICD-10-CM

## 2018-01-30 LAB — FECAL OCCULT BLOOD, IMMUNOCHEMICAL: Fecal Occult Bld: NEGATIVE

## 2018-11-14 ENCOUNTER — Other Ambulatory Visit: Payer: Self-pay | Admitting: Internal Medicine

## 2019-01-22 ENCOUNTER — Encounter: Payer: 59 | Admitting: Internal Medicine

## 2019-05-12 ENCOUNTER — Other Ambulatory Visit: Payer: Self-pay | Admitting: Internal Medicine

## 2020-02-04 ENCOUNTER — Encounter: Payer: Self-pay | Admitting: Internal Medicine

## 2020-02-04 ENCOUNTER — Ambulatory Visit: Payer: 59 | Admitting: Internal Medicine

## 2020-02-04 ENCOUNTER — Other Ambulatory Visit: Payer: Self-pay

## 2020-02-04 VITALS — BP 140/84 | HR 75 | Temp 97.6°F | Ht 70.0 in | Wt 215.0 lb

## 2020-02-04 DIAGNOSIS — L409 Psoriasis, unspecified: Secondary | ICD-10-CM | POA: Diagnosis not present

## 2020-02-04 DIAGNOSIS — Z125 Encounter for screening for malignant neoplasm of prostate: Secondary | ICD-10-CM | POA: Diagnosis not present

## 2020-02-04 DIAGNOSIS — Z1211 Encounter for screening for malignant neoplasm of colon: Secondary | ICD-10-CM

## 2020-02-04 DIAGNOSIS — K2100 Gastro-esophageal reflux disease with esophagitis, without bleeding: Secondary | ICD-10-CM

## 2020-02-04 DIAGNOSIS — Z0000 Encounter for general adult medical examination without abnormal findings: Secondary | ICD-10-CM | POA: Diagnosis not present

## 2020-02-04 LAB — COMPREHENSIVE METABOLIC PANEL
ALT: 17 U/L (ref 0–53)
AST: 14 U/L (ref 0–37)
Albumin: 4.5 g/dL (ref 3.5–5.2)
Alkaline Phosphatase: 78 U/L (ref 39–117)
BUN: 14 mg/dL (ref 6–23)
CO2: 28 mEq/L (ref 19–32)
Calcium: 9.4 mg/dL (ref 8.4–10.5)
Chloride: 104 mEq/L (ref 96–112)
Creatinine, Ser: 0.92 mg/dL (ref 0.40–1.50)
GFR: 82.64 mL/min (ref >60.00)
Glucose, Bld: 118 mg/dL — ABNORMAL HIGH (ref 70–99)
Potassium: 4.6 mEq/L (ref 3.5–5.1)
Sodium: 137 mEq/L (ref 135–145)
Total Bilirubin: 0.8 mg/dL (ref 0.2–1.2)
Total Protein: 7.3 g/dL (ref 6.0–8.3)

## 2020-02-04 LAB — CBC
HCT: 42.9 % (ref 39.0–52.0)
Hemoglobin: 14.5 g/dL (ref 13.0–17.0)
MCHC: 33.8 g/dL (ref 30.0–36.0)
MCV: 89.5 fl (ref 78.0–100.0)
Platelets: 238 10*3/uL (ref 150.0–400.0)
RBC: 4.79 Mil/uL (ref 4.22–5.81)
RDW: 13.4 % (ref 11.5–15.5)
WBC: 6.5 10*3/uL (ref 4.0–10.5)

## 2020-02-04 LAB — PSA: PSA: 1.56 ng/mL (ref 0.10–4.00)

## 2020-02-04 MED ORDER — OMEPRAZOLE 20 MG PO CPDR: 20.0000 mg | DELAYED_RELEASE_CAPSULE | Freq: Every day | ORAL | 3 refills | Status: DC

## 2020-02-04 NOTE — Assessment & Plan Note (Signed)
On tremfya

## 2020-02-04 NOTE — Assessment & Plan Note (Signed)
Healthy Discussed fitness---with wife also Urged him and wife to get COVID Flu vaccine in the fall--took one last year Start pneumonia vaccines next year FIT Will check PSA after discussion

## 2020-02-04 NOTE — Progress Notes (Signed)
Subjective:    Patient ID: Cory [name], male    DOB: [birth year], 64 y.o.   MRN: [id number]  HPI Here for physical This visit occurred during the SARS-CoV-2 public health emergency.  Safety protocols were in place, including screening questions prior to the visit, additional usage of staff PPE, and extensive cleaning of exam room while observing appropriate contact time as indicated for disinfecting solutions.   Still has indigestion problems Did take omeprazole for a while---but ran out No dysphagia though  Getting ready to retire--finishing out job in [place] Wife with dementia---will need to be home more (she no longer drives, cooks, etc)  Still on tremfya on psoriasis This is working  Current Outpatient Medications on File Prior to Visit  Medication Sig Dispense Refill  . omeprazole (PRILOSEC) 20 MG capsule Take 1 capsule (20 mg total) by mouth daily. NEEDS OFFICE VISIT 30 capsule 0  . TREMFYA 100 MG/ML SOSY Inject 1 Dose into the skin every 8 (eight) weeks.      No current facility-administered medications on file prior to visit.    No Known Allergies  Past Medical History:  Diagnosis Date  . GERD (gastroesophageal reflux disease)   . Nephrolithiasis    [facility]  . Pancreatitis, acute   . Personal history of colonic polyps    Dr Nicolasa Ducking  . Psoriasis     Past Surgical History:  Procedure Laterality Date  . groin surgery     as a child  . SHOULDER SURGERY      Family History  Problem Relation Age of Onset  . Colon cancer Neg Hx   . Stomach cancer Mother   . Cancer Mother        stomach cancer  . Diabetes Brother   . Hypertension Brother   . Cancer Father        prostate cancer  . Hypertension Father   . Cancer Sister        colon cancer    Social History   Socioeconomic History  . Marital status: Married    Spouse name: Not on file  . Number of children: 2  . Years of education: Not on file  . Highest education level: Not on file    Occupational History  . 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 Smoking status: Former Smoker    Types: Cigarettes    Quit date: 07/08/1988    Years since quitting: 31.5  . Smokeless tobacco: Never Used  Substance and Sexual Activity  . Alcohol use: No    Comment: recovering alcoholic for 13 years  . Drug use: No  . Sexual activity: Not on file  Other Topics Concern  . Not on file  Social History Narrative  . Not on file   Social Determinants of Health   Financial Resource Strain:   . Difficulty of Paying Living Expenses:   Food Insecurity:   . Worried About Charity fundraiser in the Last Year:   . Arboriculturist in the Last Year:   Transportation Needs:   . Film/video editor (Medical):   Marland Kitchen Lack of Transportation (Non-Medical):   Physical Activity:   . Days of Exercise per Week:   . Minutes of Exercise per Session:   Stress:   . Feeling of Stress :   Social Connections:   . Frequency of Communication with Friends and Family:   . Frequency of Social Gatherings with Friends  and Family:   . Attends Religious Services:   . Active Member of Clubs or Organizations:   . Attends Archivist Meetings:   Marland Kitchen Marital Status:   Intimate Partner Violence:   . Fear of Current or Ex-Partner:   . Emotionally Abused:   Marland Kitchen Physically Abused:   . Sexually Abused:    Review of Systems  Constitutional: Negative for fatigue and unexpected weight change.       Not much exercise---does walk dog Wears seat belt  HENT: Positive for hearing loss and tinnitus.        Is going to ENT for monitoring Full dentures  Eyes: Negative for visual disturbance.       No diplopia or unilateral vision loss  Respiratory: Negative for cough, chest tightness and shortness of breath.   Cardiovascular: Negative for chest pain, palpitations and leg swelling.       Feels chest symptoms at times---relates to indigestion  Gastrointestinal: Negative for  abdominal pain, blood in stool and constipation.  Endocrine: Negative for polydipsia and polyuria.  Genitourinary: Negative for difficulty urinating and urgency.  Musculoskeletal: Negative for arthralgias and joint swelling.       Some back pain at times---?from kidney stone   Skin:       Has mole in right axilla  Allergic/Immunologic: Positive for environmental allergies. Negative for immunocompromised state.       Not bad this year---uses prn OTC (not this year)  Neurological: Negative for dizziness, syncope, light-headedness and headaches.  Hematological: Negative for adenopathy. Does not bruise/bleed easily.  Psychiatric/Behavioral: Negative for dysphoric mood and sleep disturbance. The patient is not nervous/anxious.        Objective:   Physical Exam Constitutional:      Appearance: Normal appearance.  HENT:     Head: Normocephalic and atraumatic.     Right Ear: Tympanic membrane and ear canal normal.     Left Ear: Tympanic membrane and ear canal normal.     Mouth/Throat:     Comments: No lesions Eyes:     Conjunctiva/sclera: Conjunctivae normal.     Pupils: Pupils are equal, round, and reactive to light.  Cardiovascular:     Rate and Rhythm: Normal rate and regular rhythm.     Pulses: Normal pulses.     Heart sounds: No murmur heard.  No gallop.   Pulmonary:     Effort: Pulmonary effort is normal.     Breath sounds: Normal breath sounds. No wheezing or rales.  Abdominal:     Palpations: Abdomen is soft.     Tenderness: There is no abdominal tenderness.  Musculoskeletal:     Cervical back: Neck supple.     Right lower leg: No edema.     Left lower leg: No edema.  Lymphadenopathy:     Cervical: No cervical adenopathy.  Skin:    Comments: Thrombosed skin tag in right axilla  Neurological:     General: No focal deficit present.     Mental Status: He is alert and oriented to person, place, and time.  Psychiatric:        Mood and Affect: Mood normal.         Behavior: Behavior normal.            Assessment & Plan:

## 2020-02-04 NOTE — Assessment & Plan Note (Signed)
Symptoms acting up again Will restart the omeprazole

## 2020-02-04 NOTE — Patient Instructions (Signed)
Take the omeprazole every day on an empty stomach for at least 6 weeks. If you are having no indigestion then, you can decrease to every other day (and stay on this).

## 2020-02-15 ENCOUNTER — Telehealth: Payer: Self-pay | Admitting: Radiology

## 2020-02-15 ENCOUNTER — Other Ambulatory Visit (INDEPENDENT_AMBULATORY_CARE_PROVIDER_SITE_OTHER): Payer: 59

## 2020-02-15 DIAGNOSIS — Z1211 Encounter for screening for malignant neoplasm of colon: Secondary | ICD-10-CM | POA: Diagnosis not present

## 2020-02-15 DIAGNOSIS — R195 Other fecal abnormalities: Secondary | ICD-10-CM

## 2020-02-15 LAB — FECAL OCCULT BLOOD, IMMUNOCHEMICAL: Fecal Occult Bld: POSITIVE — AB

## 2020-02-15 NOTE — Telephone Encounter (Signed)
[facility] called a positive ifob, results given to Dr Letvak 

## 2020-02-15 NOTE — Telephone Encounter (Signed)
Spoke to pt. He would like Cory [name]. °

## 2020-02-15 NOTE — Telephone Encounter (Signed)
Cory [name], Not on DPR, answered and said she will let him to call.

## 2020-02-15 NOTE — Telephone Encounter (Signed)
Please let him know that the fecal test did show positive for blood. We need to go ahead with the colonoscopy. Find out if he prefers [employer] or [facility]

## 2020-03-03 ENCOUNTER — Telehealth (INDEPENDENT_AMBULATORY_CARE_PROVIDER_SITE_OTHER): Payer: Self-pay | Admitting: Gastroenterology

## 2020-03-03 ENCOUNTER — Other Ambulatory Visit: Payer: Self-pay

## 2020-03-03 DIAGNOSIS — R195 Other fecal abnormalities: Secondary | ICD-10-CM

## 2020-03-03 MED ORDER — NA SULFATE-K SULFATE-MG SULF 17.5-3.13-1.6 GM/177ML PO SOLN: 1.0000 | Freq: Once | ORAL | 0 refills | Status: AC

## 2020-03-03 NOTE — Progress Notes (Signed)
Gastroenterology Pre-Procedure Review  Request Date: Friday 03/31/20 Requesting Physician: Dr. Vicente Males  PATIENT REVIEW QUESTIONS: The patient responded to the following health history questions as indicated:    1. Are you having any GI issues? yes (Blood in stool) 2. Do you have a personal history of Polyps? no 3. Do you have a family history of Colon Cancer or Polyps? no 4. Diabetes Mellitus? no 5. Joint replacements in the past 12 months?no 6. Major health problems in the past 3 months?no 7. Any artificial heart valves, MVP, or defibrillator?no    MEDICATIONS & ALLERGIES:    Patient reports the following regarding taking any anticoagulation/antiplatelet therapy:   Plavix, Coumadin, Eliquis, Xarelto, Lovenox, Pradaxa, Brilinta, or Effient? no Aspirin? no  Patient confirms/reports the following medications:  Current Outpatient Medications  Medication Sig Dispense Refill  . omeprazole (PRILOSEC) 20 MG capsule Take 1 capsule (20 mg total) by mouth daily. 90 each 3  . TREMFYA 100 MG/ML SOSY Inject 1 Dose into the skin every 8 (eight) weeks.     . Na Sulfate-K Sulfate-Mg Sulf 17.5-3.13-1.6 GM/177ML SOLN Take 1 kit by mouth once for 1 dose. 354 mL 0   No current facility-administered medications for this visit.    Patient confirms/reports the following allergies:  No Known Allergies  No orders of the defined types were placed in this encounter.   AUTHORIZATION INFORMATION Primary Insurance: 1D#: Group #:  Secondary Insurance: 1D#: Group #:  SCHEDULE INFORMATION: Date: Friday 03/31/20 Time: Location:[facility]

## 2020-03-28 ENCOUNTER — Other Ambulatory Visit
Admission: RE | Admit: 2020-03-28 | Discharge: 2020-03-28 | Disposition: A | Discharge status: Home / Self Care | Payer: 59 | Source: Ambulatory Visit | Attending: Gastroenterology | Admitting: Gastroenterology

## 2020-03-28 ENCOUNTER — Other Ambulatory Visit: Payer: Self-pay

## 2020-03-28 DIAGNOSIS — Z20822 Contact with and (suspected) exposure to covid-19: Secondary | ICD-10-CM | POA: Diagnosis not present

## 2020-03-28 DIAGNOSIS — Z01812 Encounter for preprocedural laboratory examination: Secondary | ICD-10-CM | POA: Diagnosis present

## 2020-03-29 LAB — SARS CORONAVIRUS 2 (TAT 6-24 HRS): SARS Coronavirus 2: NEGATIVE

## 2020-03-31 ENCOUNTER — Ambulatory Visit
Admission: RE | Admit: 2020-03-31 | Discharge: 2020-03-31 | Disposition: A | Discharge status: Home / Self Care | Payer: 59 | Attending: Gastroenterology | Admitting: Gastroenterology

## 2020-03-31 ENCOUNTER — Ambulatory Visit: Payer: 59 | Admitting: Certified Registered Nurse Anesthetist

## 2020-03-31 ENCOUNTER — Encounter
Admission: RE | Disposition: A | Discharge status: Home / Self Care | Payer: Self-pay | Source: Home / Self Care | Attending: Gastroenterology

## 2020-03-31 ENCOUNTER — Other Ambulatory Visit: Payer: Self-pay

## 2020-03-31 ENCOUNTER — Encounter: Payer: Self-pay | Admitting: Gastroenterology

## 2020-03-31 DIAGNOSIS — Z87891 Personal history of nicotine dependence: Secondary | ICD-10-CM | POA: Insufficient documentation

## 2020-03-31 DIAGNOSIS — K635 Polyp of colon: Secondary | ICD-10-CM

## 2020-03-31 DIAGNOSIS — K219 Gastro-esophageal reflux disease without esophagitis: Secondary | ICD-10-CM | POA: Insufficient documentation

## 2020-03-31 DIAGNOSIS — Z79899 Other long term (current) drug therapy: Secondary | ICD-10-CM | POA: Insufficient documentation

## 2020-03-31 DIAGNOSIS — D122 Benign neoplasm of ascending colon: Secondary | ICD-10-CM | POA: Diagnosis not present

## 2020-03-31 DIAGNOSIS — K5730 Diverticulosis of large intestine without perforation or abscess without bleeding: Secondary | ICD-10-CM | POA: Insufficient documentation

## 2020-03-31 DIAGNOSIS — R195 Other fecal abnormalities: Secondary | ICD-10-CM | POA: Diagnosis not present

## 2020-03-31 DIAGNOSIS — Z8719 Personal history of other diseases of the digestive system: Secondary | ICD-10-CM | POA: Insufficient documentation

## 2020-03-31 DIAGNOSIS — L409 Psoriasis, unspecified: Secondary | ICD-10-CM | POA: Insufficient documentation

## 2020-03-31 DIAGNOSIS — K621 Rectal polyp: Secondary | ICD-10-CM | POA: Insufficient documentation

## 2020-03-31 HISTORY — PX: COLONOSCOPY WITH PROPOFOL: SHX5780

## 2020-03-31 HISTORY — DX: Personal history of urinary calculi: Z87.442

## 2020-03-31 SURGERY — COLONOSCOPY WITH PROPOFOL
Anesthesia: General

## 2020-03-31 MED ORDER — PROPOFOL 500 MG/50ML IV EMUL
INTRAVENOUS | Status: DC | PRN
  Administered 2020-03-31: 150 ug/kg/min via INTRAVENOUS

## 2020-03-31 MED ORDER — PROPOFOL 500 MG/50ML IV EMUL
INTRAVENOUS | Status: AC
  Filled 2020-03-31: qty 50

## 2020-03-31 MED ORDER — GLYCOPYRROLATE 0.2 MG/ML IJ SOLN
INTRAMUSCULAR | Status: AC
  Filled 2020-03-31: qty 1

## 2020-03-31 MED ORDER — PROPOFOL 10 MG/ML IV BOLUS
INTRAVENOUS | Status: DC | PRN
  Administered 2020-03-31: 60 mg via INTRAVENOUS
  Administered 2020-03-31: 20 mg via INTRAVENOUS
  Administered 2020-03-31: 40 mg via INTRAVENOUS

## 2020-03-31 MED ORDER — LIDOCAINE HCL (PF) 2 % IJ SOLN
INTRAMUSCULAR | Status: AC
  Filled 2020-03-31: qty 5

## 2020-03-31 MED ORDER — LIDOCAINE HCL (CARDIAC) PF 100 MG/5ML IV SOSY
PREFILLED_SYRINGE | INTRAVENOUS | Status: DC | PRN
  Administered 2020-03-31: 50 mg via INTRAVENOUS

## 2020-03-31 MED ORDER — SODIUM CHLORIDE 0.9 % IV SOLN: INTRAVENOUS | Status: DC

## 2020-03-31 NOTE — H&P (Signed)
Jonathon Bellows, MD [address] [place], [place], [place], [address] [address], [place], [place], [place], [address] Phone: [phone]  Fax: [phone]  Primary Care Physician:  Venia Carbon, MD   Pre-Procedure History & Physical: HPI:  Cory [name] is a 64 y.o. male is here for an colonoscopy.   Past Medical History:  Diagnosis Date  . GERD (gastroesophageal reflux disease)   . History of kidney stones   . Nephrolithiasis    [facility]  . Pancreatitis, acute   . Personal history of colonic polyps    Dr Nicolasa Ducking  . Psoriasis     Past Surgical History:  Procedure Laterality Date  . COLONOSCOPY    . ERCP    . groin surgery     as a child  . SHOULDER SURGERY      Prior to Admission medications   Medication Sig Start Date End Date Taking? Authorizing Provider  omeprazole (PRILOSEC) 20 MG capsule Take 1 capsule (20 mg total) by mouth daily. 02/04/20  Yes Venia Carbon, MD  TREMFYA 100 MG/ML SOSY Inject 1 Dose into the skin every 8 (eight) weeks.  01/01/18  Yes [provider]    Allergies as of 03/03/2020  . (No Known Allergies)    Family History  Problem Relation Age of Onset  . Stomach cancer Mother   . Cancer Mother        stomach cancer  . Diabetes Brother   . Hypertension Brother   . Cancer Father        prostate cancer  . Hypertension Father   . Cancer Sister        colon cancer  . Colon cancer Neg Hx     Social History   Socioeconomic History  . Marital status: Married    Spouse name: Not on file  . Number of children: 2  . Years of education: Not on file  . Highest education level: Not on file  Occupational History  . 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 Smoking status: Former Smoker    Types: Cigarettes    Quit date: 07/08/1988    Years since quitting: 31.7  . Smokeless tobacco: Never Used  Vaping Use  . Vaping Use: Never used  Substance and Sexual Activity  . Alcohol  use: No    Comment: recovering alcoholic for 13 years  . Drug use: No  . Sexual activity: Not on file  Other Topics Concern  . Not on file  Social History Narrative  . Not on file   Social Determinants of Health   Financial Resource Strain:   . Difficulty of Paying Living Expenses: Not on file  Food Insecurity:   . Worried About Charity fundraiser in the Last Year: Not on file  . Ran Out of Food in the Last Year: Not on file  Transportation Needs:   . Lack of Transportation (Medical): Not on file  . Lack of Transportation (Non-Medical): Not on file  Physical Activity:   . Days of Exercise per Week: Not on file  . Minutes of Exercise per Session: Not on file  Stress:   . Feeling of Stress : Not on file  Social Connections:   . Frequency of Communication with Friends and Family: Not on file  . Frequency of Social Gatherings with Friends and Family: Not on file  . Attends Religious Services: Not on file  .  Active Member of Clubs or Organizations: Not on file  . Attends Archivist Meetings: Not on file  . Marital Status: Not on file  Intimate Partner Violence:   . Fear of Current or Ex-Partner: Not on file  . Emotionally Abused: Not on file  . Physically Abused: Not on file  . Sexually Abused: Not on file    Review of Systems: See HPI, otherwise negative ROS  Physical Exam: BP (!) 159/78   Pulse 69   Temp (!) 97.5 F (36.4 C) (Oral)   Ht 5\' 11"  (1.803 m)   Wt 99.8 kg   SpO2 100%   BMI 30.68 kg/m  General:   Alert,  pleasant and cooperative in NAD Head:  Normocephalic and atraumatic. Neck:  Supple; no masses or thyromegaly. Lungs:  Clear throughout to auscultation, normal respiratory effort.    Heart:  +S1, +S2, Regular rate and rhythm, No edema. Abdomen:  Soft, nontender and nondistended. Normal bowel sounds, without guarding, and without rebound.   Neurologic:  Alert and  oriented x4;  grossly normal neurologically.  Impression/Plan: HAIDAR MUSE  is here for an colonoscopy to be performed for stool occult test positive. Risks, benefits, limitations, and alternatives regarding  colonoscopy have been reviewed with the patient.  Questions have been answered.  All parties agreeable.   Jonathon Bellows, MD  03/31/2020, 8:41 AM

## 2020-03-31 NOTE — Op Note (Signed)
[facility] Gastroenterology Patient Name: Cory [name] Procedure Date: 03/31/2020 8:41 AM MRN: [id number] Account #: [id number] Date of Birth: 03/05/1956 Admit Type: Outpatient Age: 64 Room: [facility] ENDO ROOM 4 Gender: Male Note Status: Finalized Procedure:             Colonoscopy Indications:           Heme positive stool Providers:             Jonathon Bellows MD, MD Referring MD:          Venia Carbon (Referring MD) Medicines:             Monitored Anesthesia Care Complications:         No immediate complications. Procedure:             Pre-Anesthesia Assessment:                        - Prior to the procedure, a History and Physical was                         performed, and patient medications, allergies and                         sensitivities were reviewed. The patient's tolerance                         of previous anesthesia was reviewed.                        - The risks and benefits of the procedure and the                         sedation options and risks were discussed with the                         patient. All questions were answered and informed                         consent was obtained.                        - ASA Grade Assessment: II - A patient with mild                         systemic disease.                        After obtaining informed consent, the colonoscope was                         passed under direct vision. Throughout the procedure,                         the patient's blood pressure, pulse, and oxygen                         saturations were monitored continuously. The                         Colonoscope was introduced through the anus and  advanced to the the cecum, identified by the                         appendiceal orifice. The colonoscopy was performed                         with ease. The patient tolerated the procedure well.                         The quality of the bowel preparation was  excellent. Findings:      The perianal and digital rectal examinations were normal.      Multiple small-mouthed diverticula were found in the sigmoid colon.      A 6 mm polyp was found in the rectum. The polyp was sessile. The polyp       was removed with a cold snare. Resection and retrieval were complete.      Three sessile polyps were found in the rectum. The polyps were 3 to 4 mm       in size. These polyps were removed with a cold biopsy forceps. Resection       and retrieval were complete.      Three sessile polyps were found in the ascending colon. The polyps were       5 to 7 mm in size. These polyps were removed with a cold snare.       Resection and retrieval were complete.      The exam was otherwise without abnormality on direct and retroflexion       views. Impression:            - Diverticulosis in the sigmoid colon.                        - One 6 mm polyp in the rectum, removed with a cold                         snare. Resected and retrieved.                        - Three 3 to 4 mm polyps in the rectum, removed with a                         cold biopsy forceps. Resected and retrieved.                        - Three 5 to 7 mm polyps in the ascending colon,                         removed with a cold snare. Resected and retrieved.                        - The examination was otherwise normal on direct and                         retroflexion views. Recommendation:        - Discharge patient to home (with escort).                        - Resume previous diet.                        -  Continue present medications.                        - Await pathology results.                        - Repeat colonoscopy for surveillance based on                         pathology results. Procedure Code(s):     --- Professional ---                        [phone], Colonoscopy, flexible; with removal of                         tumor(s), polyp(s), or other lesion(s) by snare                          technique                        45380, 38, Colonoscopy, flexible; with biopsy, single                         or multiple Diagnosis Code(s):     --- Professional ---                        K62.1, Rectal polyp                        K63.5, Polyp of colon                        R19.5, Other fecal abnormalities                        K57.30, Diverticulosis of large intestine without                         perforation or abscess without bleeding CPT copyright 2019 American Medical Association. All rights reserved. The codes documented in this report are preliminary and upon coder review may  be revised to meet current compliance requirements. Jonathon Bellows, MD Jonathon Bellows MD, MD 03/31/2020 9:02:12 AM This report has been signed electronically. Number of Addenda: 0 Note Initiated On: 03/31/2020 8:41 AM Scope Withdrawal Time: 0 hours 11 minutes 28 seconds  Total Procedure Duration: 0 hours 13 minutes 41 seconds  Estimated Blood Loss:  Estimated blood loss: none.      [address] [employer]

## 2020-03-31 NOTE — Anesthesia Preprocedure Evaluation (Signed)
Anesthesia Evaluation  Patient identified by MRN, date of birth, ID band Patient awake    Reviewed: Allergy & Precautions, H&P , NPO status , Patient's Chart, lab work & pertinent test results, reviewed documented beta blocker date and time   History of Anesthesia Complications Negative for: history of anesthetic complications  Airway Mallampati: I  TM Distance: >3 FB Neck ROM: full    Dental  (+) Upper Dentures, Lower Dentures, Dental Advidsory Given   Pulmonary neg pulmonary ROS, former smoker,    Pulmonary exam normal breath sounds clear to auscultation       Cardiovascular Exercise Tolerance: Good negative cardio ROS Normal cardiovascular exam Rhythm:regular Rate:Normal     Neuro/Psych negative neurological ROS  negative psych ROS   GI/Hepatic Neg liver ROS, GERD  ,  Endo/Other  negative endocrine ROS  Renal/GU negative Renal ROS  negative genitourinary   Musculoskeletal   Abdominal   Peds  Hematology negative hematology ROS (+)   Anesthesia Other Findings Past Medical History: No date: GERD (gastroesophageal reflux disease) No date: History of kidney stones No date: Nephrolithiasis     Comment:  [facility] No date: Pancreatitis, acute No date: Personal history of colonic polyps     Comment:  Dr Nicolasa Ducking No date: Psoriasis   Reproductive/Obstetrics negative OB ROS                             Anesthesia Physical Anesthesia Plan  ASA: II  Anesthesia Plan: General   Post-op Pain Management:    Induction: Intravenous  PONV Risk Score and Plan: 2 and Propofol infusion and TIVA  Airway Management Planned: Natural Airway and Nasal Cannula  Additional Equipment:   Intra-op Plan:   Post-operative Plan:   Informed Consent: I have reviewed the patients History and Physical, chart, labs and discussed the procedure including the risks, benefits and alternatives for the proposed  anesthesia with the patient or authorized representative who has indicated his/her understanding and acceptance.     Dental Advisory Given  Plan Discussed with: Anesthesiologist, CRNA and Surgeon  Anesthesia Plan Comments:         Anesthesia Quick Evaluation

## 2020-03-31 NOTE — Transfer of Care (Signed)
Immediate Anesthesia Transfer of Care Note  Patient: Cory [name]  Procedure(s) Performed: COLONOSCOPY WITH PROPOFOL (N/A )  Patient Location: PACU  Anesthesia Type:General  Level of Consciousness: drowsy  Airway & Oxygen Therapy: Patient Spontanous Breathing and Patient connected to face mask oxygen  Post-op Assessment: Report given to RN and Post -op Vital signs reviewed and stable  Post vital signs: Reviewed and stable  Last Vitals:  Vitals Value Taken Time  BP    Temp    Pulse    Resp    SpO2      Last Pain:  Vitals:   03/31/20 0753  TempSrc: Oral  PainSc: 0-No pain         Complications: No complications documented.

## 2020-03-31 NOTE — Anesthesia Postprocedure Evaluation (Signed)
Anesthesia Post Note  Patient: Cory [name]  Procedure(s) Performed: COLONOSCOPY WITH PROPOFOL (N/A )  Patient location during evaluation: Endoscopy Anesthesia Type: General Level of consciousness: awake and alert Pain management: pain level controlled Vital Signs Assessment: post-procedure vital signs reviewed and stable Respiratory status: spontaneous breathing, nonlabored ventilation, respiratory function stable and patient connected to nasal cannula oxygen Cardiovascular status: blood pressure returned to baseline and stable Postop Assessment: no apparent nausea or vomiting Anesthetic complications: no   No complications documented.   Last Vitals:  Vitals:   03/31/20 0923 03/31/20 0933  BP: 134/84 132/81  Pulse: 61 (!) 54  Resp: 11 15  Temp:    SpO2: 99% 100%    Last Pain:  Vitals:   03/31/20 0933  TempSrc:   PainSc: 0-No pain                 Martha Clan

## 2020-04-03 LAB — SURGICAL PATHOLOGY

## 2020-05-08 ENCOUNTER — Encounter: Payer: Self-pay | Admitting: Gastroenterology

## 2020-05-26 ENCOUNTER — Other Ambulatory Visit: Payer: Self-pay

## 2020-05-26 ENCOUNTER — Encounter: Payer: Self-pay | Admitting: Internal Medicine

## 2020-05-26 ENCOUNTER — Ambulatory Visit (INDEPENDENT_AMBULATORY_CARE_PROVIDER_SITE_OTHER): Payer: Medicare Other | Admitting: Internal Medicine

## 2020-05-26 ENCOUNTER — Telehealth: Payer: Self-pay | Admitting: Internal Medicine

## 2020-05-26 VITALS — BP 138/76 | HR 59 | Temp 96.9°F | Ht 70.0 in | Wt 210.0 lb

## 2020-05-26 DIAGNOSIS — R1319 Other dysphagia: Secondary | ICD-10-CM

## 2020-05-26 MED ORDER — PANTOPRAZOLE SODIUM 40 MG PO TBEC
40.0000 mg | DELAYED_RELEASE_TABLET | Freq: Two times a day (BID) | ORAL | 3 refills | Status: DC
Start: 2020-05-26 — End: 2020-10-23

## 2020-05-26 MED ORDER — PANTOPRAZOLE SODIUM 40 MG PO TBEC: 40.0000 mg | DELAYED_RELEASE_TABLET | Freq: Every day | ORAL | 3 refills | Status: DC

## 2020-05-26 NOTE — Telephone Encounter (Signed)
Pt called back wanting to get clarification on taking pantoprazole  He thinks dr Silvio Pate told him to take in am before eating and take another one at bedtime on empty stomach   The direction on bottle take one tablet once a day    Please advise

## 2020-05-26 NOTE — Progress Notes (Signed)
Subjective:    Patient ID: Cory [name], male    DOB: Aug 18, 1955, 64 y.o.   MRN: [id number]  HPI Here due to trouble swallowing This visit occurred during the SARS-CoV-2 public health emergency.  Safety protocols were in place, including screening questions prior to the visit, additional usage of staff PPE, and extensive cleaning of exam room while observing appropriate contact time as indicated for disinfecting solutions.   Having "problem with getting food to go down" He wonders if J&J vaccine could be associated with this  Did start the omeprazole regularly after July visit Wasn't helping so he doubled the dose (40mg ) Is taking on an empty stomach  Lost 10#--he had noticed this  Current Outpatient Medications on File Prior to Visit  Medication Sig Dispense Refill  . omeprazole (PRILOSEC) 20 MG capsule Take 1 capsule (20 mg total) by mouth daily. 90 each 3  . TREMFYA 100 MG/ML SOSY Inject 1 Dose into the skin every 8 (eight) weeks.      No current facility-administered medications on file prior to visit.    No Known Allergies  Past Medical History:  Diagnosis Date  . GERD (gastroesophageal reflux disease)   . History of kidney stones   . Nephrolithiasis    [facility]  . Pancreatitis, acute   . Personal history of colonic polyps    Dr Nicolasa Ducking  . Psoriasis     Past Surgical History:  Procedure Laterality Date  . COLONOSCOPY    . COLONOSCOPY WITH PROPOFOL N/A 03/31/2020   Procedure: COLONOSCOPY WITH PROPOFOL;  Surgeon: Jonathon Bellows, MD;  Location: [facility] ENDOSCOPY;  Service: Gastroenterology;  Laterality: N/A;  . ERCP    . groin surgery     as a child  . SHOULDER SURGERY      Family History  Problem Relation Age of Onset  . Stomach cancer Mother   . Cancer Mother        stomach cancer  . Diabetes Brother   . Hypertension Brother   . Cancer Father        prostate cancer  . Hypertension Father   . Cancer Sister        colon cancer  . Colon cancer Neg Hx      Social History   Socioeconomic History  . Marital status: Married    Spouse name: Not on file  . Number of children: 2  . Years of education: Not on file  . Highest education level: Not on file  Occupational History  . 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 Smoking status: Former Smoker    Types: Cigarettes    Quit date: 07/08/1988    Years since quitting: 31.9  . Smokeless tobacco: Never Used  Vaping Use  . Vaping Use: Never used  Substance and Sexual Activity  . Alcohol use: No    Comment: recovering alcoholic for 13 years  . Drug use: No  . Sexual activity: Not on file  Other Topics Concern  . Not on file  Social History Narrative  . Not on file   Social Determinants of Health   Financial Resource Strain:   . Difficulty of Paying Living Expenses: Not on file  Food Insecurity:   . Worried About Charity fundraiser in the Last Year: Not on file  . Ran Out of Food in the Last Year: Not on file  Transportation Needs:   . Lack of Transportation (Medical): Not on  file  . Lack of Transportation (Non-Medical): Not on file  Physical Activity:   . Days of Exercise per Week: Not on file  . Minutes of Exercise per Session: Not on file  Stress:   . Feeling of Stress : Not on file  Social Connections:   . Frequency of Communication with Friends and Family: Not on file  . Frequency of Social Gatherings with Friends and Family: Not on file  . Attends Religious Services: Not on file  . Active Member of Clubs or Organizations: Not on file  . Attends Archivist Meetings: Not on file  . Marital Status: Not on file  Intimate Partner Violence:   . Fear of Current or Ex-Partner: Not on file  . Emotionally Abused: Not on file  . Physically Abused: Not on file  . Sexually Abused: Not on file   Review of Systems  No abdominal pain No nausea or vomiting     Objective:   Physical Exam Constitutional:      Appearance: Normal  appearance.  Cardiovascular:     Rate and Rhythm: Normal rate and regular rhythm.     Heart sounds: No murmur heard.  No gallop.   Pulmonary:     Effort: Pulmonary effort is normal.     Breath sounds: Normal breath sounds. No wheezing or rales.  Abdominal:     Palpations: Abdomen is soft.     Tenderness: There is no abdominal tenderness.  Musculoskeletal:     Cervical back: Neck supple.  Lymphadenopathy:     Cervical: No cervical adenopathy.  Neurological:     Mental Status: He is alert.            Assessment & Plan:

## 2020-05-26 NOTE — Assessment & Plan Note (Addendum)
Despite the PPI treatment Will change the meds Refer back to GI---will probably need EGD Discussed not lying down after eating ---he has tended to do this due to his schedule

## 2020-05-26 NOTE — Telephone Encounter (Signed)
He is correct about how I wanted him to take it (twice a day) I must have put the wrong instructions when I sent the Rx He should take it twice a day

## 2020-05-26 NOTE — Telephone Encounter (Signed)
Spoke to pt. Sent in new rx with twice a day directions.

## 2020-06-12 ENCOUNTER — Ambulatory Visit (INDEPENDENT_AMBULATORY_CARE_PROVIDER_SITE_OTHER): Payer: Medicare Other | Admitting: Gastroenterology

## 2020-06-12 ENCOUNTER — Other Ambulatory Visit: Payer: Self-pay

## 2020-06-12 VITALS — BP 154/86 | HR 89 | Temp 98.2°F | Ht 70.0 in | Wt 201.0 lb

## 2020-06-12 DIAGNOSIS — R1310 Dysphagia, unspecified: Secondary | ICD-10-CM

## 2020-06-12 NOTE — Progress Notes (Signed)
Jonathon Bellows MD, MRCP(U.K) [address]  [place]  [place], [place] [address]  Main: [phone]  Fax: [phone]   Gastroenterology Consultation  Referring Provider:     Venia Carbon, MD Primary Care Physician:  Venia Carbon, MD Primary Gastroenterologist:  Dr. Jonathon Bellows  Reason for Consultation:     Dysphagia        HPI:   Cory [name] is a 64 y.o. y/o male referred for consultation & management  by Dr. Silvio Pate, Theophilus Kinds, MD.    He has been referred to [place] today to see me for dysphagia.  I performed a colonoscopy on 03/31/2020 for a positive fit test.  He had diverticulosis of the sigmoid colon and a 6 mm polyp was resected in the rectum and 3 other polyps were also resected in the rectum.  On retroflexion there were internal hemorrhoids.Pathology demonstrated 3 tubular adenomas.  He states that for the past few months he has had difficulty swallowing solids.  No issues with liquids.  Harder to swallow foods such as meat.  Has had issues with acid reflux and was recently started on Protonix 40 mg twice daily.  Since starting on Protonix he has felt significantly better.  Denies any history of smoking.  No family history of esophageal cancer.  No weight loss.  No prior endoscopy.  Presently has no odynophagia.   Past Medical History:  Diagnosis Date  . GERD (gastroesophageal reflux disease)   . History of kidney stones   . Nephrolithiasis    [facility]  . Pancreatitis, acute   . Personal history of colonic polyps    Dr Nicolasa Ducking  . Psoriasis     Past Surgical History:  Procedure Laterality Date  . COLONOSCOPY    . COLONOSCOPY WITH PROPOFOL N/A 03/31/2020   Procedure: COLONOSCOPY WITH PROPOFOL;  Surgeon: Jonathon Bellows, MD;  Location: [facility] ENDOSCOPY;  Service: Gastroenterology;  Laterality: N/A;  . ERCP    . groin surgery     as a child  . SHOULDER SURGERY      Prior to Admission medications   Medication Sig Start Date End Date Taking? Authorizing Provider    pantoprazole (PROTONIX) 40 MG tablet Take 1 tablet (40 mg total) by mouth 2 (two) times daily before a meal. 05/26/20   Venia Carbon, MD  TREMFYA 100 MG/ML SOSY Inject 1 Dose into the skin every 8 (eight) weeks.  01/01/18   [provider]    Family History  Problem Relation Age of Onset  . Stomach cancer Mother   . Cancer Mother        stomach cancer  . Diabetes Brother   . Hypertension Brother   . Cancer Father        prostate cancer  . Hypertension Father   . Cancer Sister        colon cancer  . Colon cancer Neg Hx      Social History   Tobacco Use  . Smoking status: Former Smoker    Types: Cigarettes    Quit date: 07/08/1988    Years since quitting: 31.9  . Smokeless tobacco: Never Used  Vaping Use  . Vaping Use: Never used  Substance Use Topics  . Alcohol use: No    Comment: recovering alcoholic for 13 years  . Drug use: No    Allergies as of 06/12/2020  . (No Known Allergies)    Review of Systems:    All systems reviewed and negative except  where noted in HPI.   Physical Exam:  There were no vitals taken for this visit. No LMP for male patient. Psych:  Alert and cooperative. Normal mood and affect. General:   Alert,  Well-developed, well-nourished, pleasant and cooperative in NAD Head:  Normocephalic and atraumatic. Eyes:  Sclera clear, no icterus.   Conjunctiva pink. Ears:  Normal auditory acuity. Lungs:  Respirations even and unlabored.  Clear throughout to auscultation.   No wheezes, crackles, or rhonchi. No acute distress. Heart:  Regular rate and rhythm; no murmurs, clicks, rubs, or gallops. Abdomen:  Normal bowel sounds.  No bruits.  Soft, non-tender and non-distended without masses, hepatosplenomegaly or hernias noted.  No guarding or rebound tenderness.   . Neurologic:  Alert and oriented x3;  grossly normal neurologically. Psych:  Alert and cooperative. Normal mood and affect.  Imaging Studies: No results found.  Assessment and  Plan:   Cory [name] is a 64 y.o. y/o male has been referred for dysphagia.  Significant relief after commencing on a PPI.  Very likely he has underlying acid reflux and related dysphagia.  Plan 1.  EGD 2.  PPI Prilosec 40 mg a day 30 minutes before breakfast 3.  Counseled on acid reflux, provided patient information, advised to use a wedge pillow provided him a picture of the same.  Advised to avoid eating for 2 hours before bedtime.  Suggested small meals more often rather than large meals at bedtime.  Advised on taking his PPI 30 minutes before breakfast and 30 minutes before dinner.  I will plan to decrease the dose of his PPI after his EGD.  I have discussed alternative options, risks & benefits,  which include, but are not limited to, bleeding, infection, perforation,respiratory complication & drug reaction.  The patient agrees with this plan & written consent will be obtained.    Follow up in 8 weeks telephone visit   Dr Jonathon Bellows MD,MRCP(U.K)

## 2020-06-12 NOTE — Patient Instructions (Signed)

## 2020-06-15 ENCOUNTER — Other Ambulatory Visit: Payer: Self-pay

## 2020-06-15 ENCOUNTER — Other Ambulatory Visit
Admission: RE | Admit: 2020-06-15 | Discharge: 2020-06-15 | Disposition: A | Discharge status: Home / Self Care | Payer: Medicare Other | Source: Ambulatory Visit | Attending: Gastroenterology | Admitting: Gastroenterology

## 2020-06-15 DIAGNOSIS — Z01812 Encounter for preprocedural laboratory examination: Secondary | ICD-10-CM | POA: Diagnosis present

## 2020-06-15 DIAGNOSIS — Z20822 Contact with and (suspected) exposure to covid-19: Secondary | ICD-10-CM | POA: Diagnosis not present

## 2020-06-16 LAB — SARS CORONAVIRUS 2 (TAT 6-24 HRS): SARS Coronavirus 2: NEGATIVE

## 2020-06-19 ENCOUNTER — Ambulatory Visit: Payer: Medicare Other | Admitting: Certified Registered"

## 2020-06-19 ENCOUNTER — Encounter
Admission: RE | Disposition: A | Discharge status: Home / Self Care | Payer: Self-pay | Source: Home / Self Care | Attending: Gastroenterology

## 2020-06-19 ENCOUNTER — Ambulatory Visit
Admission: RE | Admit: 2020-06-19 | Discharge: 2020-06-19 | Disposition: A | Discharge status: Home / Self Care | Payer: Medicare Other | Attending: Gastroenterology | Admitting: Gastroenterology

## 2020-06-19 DIAGNOSIS — Z87891 Personal history of nicotine dependence: Secondary | ICD-10-CM | POA: Insufficient documentation

## 2020-06-19 DIAGNOSIS — K2289 Other specified disease of esophagus: Secondary | ICD-10-CM | POA: Diagnosis not present

## 2020-06-19 DIAGNOSIS — Z79899 Other long term (current) drug therapy: Secondary | ICD-10-CM | POA: Diagnosis not present

## 2020-06-19 DIAGNOSIS — D490 Neoplasm of unspecified behavior of digestive system: Secondary | ICD-10-CM | POA: Diagnosis not present

## 2020-06-19 DIAGNOSIS — R1310 Dysphagia, unspecified: Secondary | ICD-10-CM | POA: Insufficient documentation

## 2020-06-19 DIAGNOSIS — Z800 Family history of malignant neoplasm of digestive organs: Secondary | ICD-10-CM | POA: Insufficient documentation

## 2020-06-19 HISTORY — PX: ESOPHAGOGASTRODUODENOSCOPY (EGD) WITH PROPOFOL: SHX5813

## 2020-06-19 SURGERY — ESOPHAGOGASTRODUODENOSCOPY (EGD) WITH PROPOFOL
Anesthesia: General

## 2020-06-19 MED ORDER — GLYCOPYRROLATE 0.2 MG/ML IJ SOLN
INTRAMUSCULAR | Status: DC | PRN
  Administered 2020-06-19: .2 mg via INTRAVENOUS

## 2020-06-19 MED ORDER — PROPOFOL 10 MG/ML IV BOLUS
INTRAVENOUS | Status: DC | PRN
  Administered 2020-06-19 (×3): 50 mg via INTRAVENOUS

## 2020-06-19 MED ORDER — SODIUM CHLORIDE 0.9 % IV SOLN
INTRAVENOUS | Status: DC
  Administered 2020-06-19: 20 mL/h via INTRAVENOUS

## 2020-06-19 MED ORDER — LIDOCAINE HCL (CARDIAC) PF 100 MG/5ML IV SOSY
PREFILLED_SYRINGE | INTRAVENOUS | Status: DC | PRN
  Administered 2020-06-19: 50 mg via INTRAVENOUS

## 2020-06-19 NOTE — Brief Op Note (Signed)
Scope unable to proceed past esophageal mass

## 2020-06-19 NOTE — Transfer of Care (Signed)
Immediate Anesthesia Transfer of Care Note  Patient: Cory [name]  Procedure(s) Performed: ESOPHAGOGASTRODUODENOSCOPY (EGD) WITH PROPOFOL (N/A )  Patient Location: PACU and Endoscopy Unit  Anesthesia Type:General  Level of Consciousness: drowsy  Airway & Oxygen Therapy: Patient Spontanous Breathing  Post-op Assessment: Report given to RN  Post vital signs: stable  Last Vitals:  Vitals Value Taken Time  BP    Temp    Pulse    Resp    SpO2      Last Pain:  Vitals:   06/19/20 0939  TempSrc: Temporal  PainSc: 0-No pain         Complications: No complications documented.

## 2020-06-19 NOTE — Op Note (Signed)
[facility] Gastroenterology Patient Name: Cory [name] Procedure Date: 06/19/2020 9:55 AM MRN: [id number] Account #: [id number] Date of Birth: 1956-06-22 Admit Type: Outpatient Age: 64 Room: [facility] ENDO ROOM 4 Gender: Male Note Status: Finalized Procedure:             Upper GI endoscopy Indications:           Dysphagia Providers:             Jonathon Bellows MD, MD Referring MD:          Venia Carbon (Referring MD) Medicines:             Monitored Anesthesia Care Complications:         No immediate complications. Procedure:             Pre-Anesthesia Assessment:                        - Prior to the procedure, a History and Physical was                         performed, and patient medications, allergies and                         sensitivities were reviewed. The patient's tolerance                         of previous anesthesia was reviewed.                        - The risks and benefits of the procedure and the                         sedation options and risks were discussed with the                         patient. All questions were answered and informed                         consent was obtained.                        - ASA Grade Assessment: II - A patient with mild                         systemic disease.                        After obtaining informed consent, the endoscope was                         passed under direct vision. Throughout the procedure,                         the patient's blood pressure, pulse, and oxygen                         saturations were monitored continuously. The Endoscope                         was introduced through the mouth, and advanced to  the                         lower third of esophagus. The upper GI endoscopy was                         accomplished with ease. The patient tolerated the                         procedure well. Findings:      A large, fungating mass with bleeding and stigmata of recent  bleeding       was found in the lower third of the esophagus, 35 cm from the incisors.       The mass was completely obstructing. Biopsies were taken with a cold       forceps for histology. 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                         Biopsied. Recommendation:        - Discharge patient to home (with escort).                        - Nectar thick liquids diet.                        - Continue present medications.                        - Await pathology results.                        - Return to my office in 1 week. Procedure Code(s):     --- Professional ---                        [phone], Esophagoscopy, flexible, transoral; with                         biopsy, single or multiple Diagnosis Code(s):     --- Professional ---                        D49.0, Neoplasm of unspecified behavior of digestive                         system                        R13.10, Dysphagia, unspecified CPT copyright 2019 American Medical Association. All rights reserved. The codes documented in this report are preliminary and upon coder review may  be revised to meet current compliance requirements. Jonathon Bellows, MD Jonathon Bellows MD, MD 06/19/2020 10:15:53 AM This report has been signed electronically. Number of Addenda: 0 Note Initiated On: 06/19/2020 9:55 AM Estimated Blood Loss:  Estimated blood loss: none.      [facility]

## 2020-06-19 NOTE — Anesthesia Postprocedure Evaluation (Signed)
Anesthesia Post Note  Patient: Cory [name]  Procedure(s) Performed: ESOPHAGOGASTRODUODENOSCOPY (EGD) WITH PROPOFOL (N/A )  Patient location during evaluation: Endoscopy Anesthesia Type: General Level of consciousness: awake and alert Pain management: pain level controlled Vital Signs Assessment: post-procedure vital signs reviewed and stable Respiratory status: spontaneous breathing, nonlabored ventilation, respiratory function stable and patient connected to nasal cannula oxygen Cardiovascular status: blood pressure returned to baseline and stable Postop Assessment: no apparent nausea or vomiting Anesthetic complications: no   No complications documented.   Last Vitals:  Vitals:   06/19/20 1038 06/19/20 1048  BP: (!) 148/80 137/82  Pulse:    Resp:    Temp:    SpO2:      Last Pain:  Vitals:   06/19/20 1048  TempSrc:   PainSc: 0-No pain                 Arita Miss

## 2020-06-19 NOTE — Anesthesia Preprocedure Evaluation (Signed)
Anesthesia Evaluation  Patient identified by MRN, date of birth, ID band Patient awake    Reviewed: Allergy & Precautions, H&P , NPO status , Patient's Chart, lab work & pertinent test results, reviewed documented beta blocker date and time   History of Anesthesia Complications Negative for: history of anesthetic complications  Airway Mallampati: II  TM Distance: >3 FB Neck ROM: full    Dental  (+) Edentulous Upper, Edentulous Lower   Pulmonary neg pulmonary ROS, neg sleep apnea, neg COPD, Patient abstained from smoking.Not current smoker, former smoker,    Pulmonary exam normal breath sounds clear to auscultation       Cardiovascular Exercise Tolerance: Good METS(-) hypertension(-) CAD and (-) Past MI negative cardio ROS Normal cardiovascular exam(-) dysrhythmias  Rhythm:regular Rate:Normal - Systolic murmurs    Neuro/Psych negative neurological ROS  negative psych ROS   GI/Hepatic Neg liver ROS, GERD  ,  Endo/Other  negative endocrine ROSneg diabetes  Renal/GU negative Renal ROS  negative genitourinary   Musculoskeletal   Abdominal   Peds  Hematology negative hematology ROS (+)   Anesthesia Other Findings Past Medical History: No date: GERD (gastroesophageal reflux disease) No date: History of kidney stones No date: Nephrolithiasis     Comment:  [facility] No date: Pancreatitis, acute No date: Personal history of colonic polyps     Comment:  Dr Nicolasa Ducking No date: Psoriasis   Reproductive/Obstetrics negative OB ROS                             Anesthesia Physical  Anesthesia Plan  ASA: II  Anesthesia Plan: General   Post-op Pain Management:    Induction: Intravenous  PONV Risk Score and Plan: 2 and Propofol infusion, TIVA and Ondansetron  Airway Management Planned: Natural Airway and Nasal Cannula  Additional Equipment: None  Intra-op Plan:   Post-operative Plan:    Informed Consent: I have reviewed the patients History and Physical, chart, labs and discussed the procedure including the risks, benefits and alternatives for the proposed anesthesia with the patient or authorized representative who has indicated his/her understanding and acceptance.     Dental Advisory Given  Plan Discussed with: Anesthesiologist, CRNA and Surgeon  Anesthesia Plan Comments: (Discussed risks of anesthesia with patient, including possibility of difficulty with spontaneous ventilation under anesthesia necessitating airway intervention, PONV, and rare risks such as cardiac or respiratory or neurological events. Patient understands.)        Anesthesia Quick Evaluation

## 2020-06-19 NOTE — H&P (Signed)
Jonathon Bellows, MD [address] [place], [place], [place], [address] [address], [place], [place], [place], [address] Phone: [phone]  Fax: [phone]  Primary Care Physician:  Venia Carbon, MD   Pre-Procedure History & Physical: HPI:  Cory [name] is a 64 y.o. male is here for an endoscopy    Past Medical History:  Diagnosis Date  . GERD (gastroesophageal reflux disease)   . History of kidney stones   . Nephrolithiasis    [facility]  . Pancreatitis, acute   . Personal history of colonic polyps    Dr Nicolasa Ducking  . Psoriasis     Past Surgical History:  Procedure Laterality Date  . COLONOSCOPY    . COLONOSCOPY WITH PROPOFOL N/A 03/31/2020   Procedure: COLONOSCOPY WITH PROPOFOL;  Surgeon: Jonathon Bellows, MD;  Location: [facility] ENDOSCOPY;  Service: Gastroenterology;  Laterality: N/A;  . ERCP    . groin surgery     as a child  . SHOULDER SURGERY      Prior to Admission medications   Medication Sig Start Date End Date Taking? Authorizing Provider  pantoprazole (PROTONIX) 40 MG tablet Take 1 tablet (40 mg total) by mouth 2 (two) times daily before a meal. 05/26/20  Yes Venia Carbon, MD  TREMFYA 100 MG/ML SOSY Inject 1 Dose into the skin every 8 (eight) weeks.  01/01/18  Yes [provider]    Allergies as of 06/12/2020  . (No Known Allergies)    Family History  Problem Relation Age of Onset  . Stomach cancer Mother   . Cancer Mother        stomach cancer  . Diabetes Brother   . Hypertension Brother   . Cancer Father        prostate cancer  . Hypertension Father   . Cancer Sister        colon cancer  . Colon cancer Neg Hx     Social History   Socioeconomic History  . Marital status: Married    Spouse name: Not on file  . Number of children: 2  . Years of education: Not on file  . Highest education level: Not on file  Occupational History  . 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 Smoking  status: Former Smoker    Types: Cigarettes    Quit date: 07/08/1988    Years since quitting: 31.9  . Smokeless tobacco: Never Used  Vaping Use  . Vaping Use: Never used  Substance and Sexual Activity  . Alcohol use: No    Comment: recovering alcoholic for 13 years  . Drug use: No  . Sexual activity: Not on file  Other Topics Concern  . Not on file  Social History Narrative  . Not on file   Social Determinants of Health   Financial Resource Strain: Not on file  Food Insecurity: Not on file  Transportation Needs: Not on file  Physical Activity: Not on file  Stress: Not on file  Social Connections: Not on file  Intimate Partner Violence: Not on file    Review of Systems: See HPI, otherwise negative ROS  Physical Exam: BP (!) 153/86   Pulse 74   Temp (!) 96.2 F (35.7 C) (Temporal)   Resp 20   Ht 5\' 11"  (1.803 m)   Wt 91.2 kg   SpO2 99%   BMI 28.03 kg/m  General:   Alert,  pleasant and cooperative in NAD  Head:  Normocephalic and atraumatic. Neck:  Supple; no masses or thyromegaly. Lungs:  Clear throughout to auscultation, normal respiratory effort.    Heart:  +S1, +S2, Regular rate and rhythm, No edema. Abdomen:  Soft, nontender and nondistended. Normal bowel sounds, without guarding, and without rebound.   Neurologic:  Alert and  oriented x4;  grossly normal neurologically.  Impression/Plan: Cory [name] is here for an endoscopy  to be performed for  evaluation of dysphagia    Risks, benefits, limitations, and alternatives regarding endoscopy have been reviewed with the patient.  Questions have been answered.  All parties agreeable.   Jonathon Bellows, MD  06/19/2020, 9:45 AM

## 2020-06-20 ENCOUNTER — Telehealth: Payer: Self-pay | Admitting: Gastroenterology

## 2020-06-20 ENCOUNTER — Encounter: Payer: Self-pay | Admitting: Gastroenterology

## 2020-06-20 NOTE — Telephone Encounter (Signed)
Patient scheduled  FYI

## 2020-06-20 NOTE — Telephone Encounter (Signed)
-----   Message from Cory [name], [place] sent at 06/20/2020 11:04 AM EST ----- Regarding: Add pt to schedule on 06-21-20 Per Dr. Vicente Males, please add pt to tomorrow's schedule at 8:15 am to discuss biopsy result...the patient is aware

## 2020-06-21 ENCOUNTER — Ambulatory Visit (INDEPENDENT_AMBULATORY_CARE_PROVIDER_SITE_OTHER): Payer: Medicare Other | Admitting: Gastroenterology

## 2020-06-21 VITALS — BP 149/75 | HR 83 | Temp 97.9°F | Ht 70.0 in | Wt 197.0 lb

## 2020-06-21 DIAGNOSIS — C159 Malignant neoplasm of esophagus, unspecified: Secondary | ICD-10-CM | POA: Diagnosis not present

## 2020-06-21 NOTE — Progress Notes (Signed)
Dr Silvio Pate   I have discussed the results with the patient and I have expedited his oncology referral who will see him tomorrow at their office  Milka Windholz

## 2020-06-21 NOTE — Progress Notes (Signed)
   Jonathon Bellows MD, MRCP(U.K) [address]  [place]  [place], [place] [address]  Main: [phone]  Fax: [phone]   Primary Care Physician: Venia Carbon, MD  Primary Gastroenterologist:  Dr. Jonathon Bellows   Dysphagia here to discuss results of recent endoscopy.  HPI: Cory [name] is a 64 y.o. male   He was initially referred and seen on 06/12/2020 for dysphagia.  Ongoing for a few months.  No prior endoscopy.  Denies any weight loss.  Hemoglobin in June 2021 was 14.5 g.  06/19/2020: EGD: Large mass seen at the 35 cm mark from the incisors.Causing significant obstruction and I could not pass the scope beyond the lesion.  Very friable and soft lesion biopsies were taken and demonstrated adenocarcinoma moderately differentiated.  I informed him of the results and presently he has no issues swallowing.  Current Outpatient Medications  Medication Sig Dispense Refill  . pantoprazole (PROTONIX) 40 MG tablet Take 1 tablet (40 mg total) by mouth 2 (two) times daily before a meal. 60 tablet 3  . TREMFYA 100 MG/ML SOSY Inject 1 Dose into the skin every 8 (eight) weeks.      No current facility-administered medications for this visit.    Allergies as of 06/21/2020  . (No Known Allergies)    ROS:  General: Negative for anorexia, weight loss, fever, chills, fatigue, weakness. ENT: Negative for hoarseness, difficulty swallowing , nasal congestion. CV: Negative for chest pain, angina, palpitations, dyspnea on exertion, peripheral edema.  Respiratory: Negative for dyspnea at rest, dyspnea on exertion, cough, sputum, wheezing.  GI: See history of present illness. GU:  Negative for dysuria, hematuria, urinary incontinence, urinary frequency, nocturnal urination.  Endo: Negative for unusual weight change.    Physical Examination:   There were no vitals taken for this visit.  General: Well-nourished, well-developed in no acute distress.  Eyes: No icterus. Conjunctivae  pink. Mouth: Oropharyngeal mucosa moist and pink , no lesions erythema or exudate. Neuro: Alert and oriented x 3.  Grossly intact. Skin: Warm and dry, no jaundice.   Psych: Alert and cooperative, normal mood and affect.   Imaging Studies: No results found.  Assessment and Plan:   Cory [name] is a 64 y.o. y/o male here to follow-up for dysphagia.  EGD 2 days back showed a large mass causing significant obstruction in the lower end of the esophagus I could not pass the scope past the mass.  It is very friable and bled on contact.  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  I spoke to the on-call oncologist Dr. [name] yesterday after I got the results and she will expedite his office visit to see him tomorrow, discuss about staging scans and further management.  This visit today was to explain to him the results of the biopsy.    Dr Jonathon Bellows  MD,MRCP [facility]) Follow up as needed

## 2020-06-22 ENCOUNTER — Inpatient Hospital Stay: Payer: Medicare Other

## 2020-06-22 ENCOUNTER — Inpatient Hospital Stay: Payer: Medicare Other | Attending: Oncology | Admitting: Oncology

## 2020-06-22 VITALS — BP 152/74 | HR 84 | Temp 99.8°F | Resp 18 | Ht 70.0 in | Wt 197.8 lb

## 2020-06-22 DIAGNOSIS — N529 Male erectile dysfunction, unspecified: Secondary | ICD-10-CM

## 2020-06-22 DIAGNOSIS — K769 Liver disease, unspecified: Secondary | ICD-10-CM | POA: Insufficient documentation

## 2020-06-22 DIAGNOSIS — C155 Malignant neoplasm of lower third of esophagus: Secondary | ICD-10-CM | POA: Diagnosis not present

## 2020-06-22 DIAGNOSIS — K219 Gastro-esophageal reflux disease without esophagitis: Secondary | ICD-10-CM | POA: Insufficient documentation

## 2020-06-22 DIAGNOSIS — Z8042 Family history of malignant neoplasm of prostate: Secondary | ICD-10-CM

## 2020-06-22 DIAGNOSIS — C159 Malignant neoplasm of esophagus, unspecified: Secondary | ICD-10-CM

## 2020-06-22 DIAGNOSIS — Z833 Family history of diabetes mellitus: Secondary | ICD-10-CM | POA: Insufficient documentation

## 2020-06-22 DIAGNOSIS — Z5111 Encounter for antineoplastic chemotherapy: Secondary | ICD-10-CM | POA: Insufficient documentation

## 2020-06-22 DIAGNOSIS — R509 Fever, unspecified: Secondary | ICD-10-CM | POA: Diagnosis not present

## 2020-06-22 DIAGNOSIS — Z8719 Personal history of other diseases of the digestive system: Secondary | ICD-10-CM | POA: Insufficient documentation

## 2020-06-22 DIAGNOSIS — Z800 Family history of malignant neoplasm of digestive organs: Secondary | ICD-10-CM | POA: Insufficient documentation

## 2020-06-22 DIAGNOSIS — Z87891 Personal history of nicotine dependence: Secondary | ICD-10-CM | POA: Diagnosis not present

## 2020-06-22 DIAGNOSIS — Z7189 Other specified counseling: Secondary | ICD-10-CM

## 2020-06-22 DIAGNOSIS — Z7952 Long term (current) use of systemic steroids: Secondary | ICD-10-CM | POA: Diagnosis not present

## 2020-06-22 DIAGNOSIS — Z8249 Family history of ischemic heart disease and other diseases of the circulatory system: Secondary | ICD-10-CM | POA: Diagnosis not present

## 2020-06-22 DIAGNOSIS — Z79899 Other long term (current) drug therapy: Secondary | ICD-10-CM

## 2020-06-22 DIAGNOSIS — R1310 Dysphagia, unspecified: Secondary | ICD-10-CM | POA: Diagnosis not present

## 2020-06-22 DIAGNOSIS — I700 Atherosclerosis of aorta: Secondary | ICD-10-CM | POA: Diagnosis not present

## 2020-06-22 DIAGNOSIS — N200 Calculus of kidney: Secondary | ICD-10-CM | POA: Diagnosis not present

## 2020-06-22 LAB — CBC WITH DIFFERENTIAL/PLATELET
Abs Immature Granulocytes: 0.04 10*3/uL (ref 0.00–0.07)
Basophils Absolute: 0 10*3/uL (ref 0.0–0.1)
Basophils Relative: 0 %
Eosinophils Absolute: 0.1 10*3/uL (ref 0.0–0.5)
Eosinophils Relative: 1 %
HCT: 38.6 % — ABNORMAL LOW (ref 39.0–52.0)
Hemoglobin: 12.6 g/dL — ABNORMAL LOW (ref 13.0–17.0)
Immature Granulocytes: 1 %
Lymphocytes Relative: 13 %
Lymphs Abs: 1.1 10*3/uL (ref 0.7–4.0)
MCH: 27.8 pg (ref 26.0–34.0)
MCHC: 32.6 g/dL (ref 30.0–36.0)
MCV: 85.2 fL (ref 80.0–100.0)
Monocytes Absolute: 0.9 10*3/uL (ref 0.1–1.0)
Monocytes Relative: 10 %
Neutro Abs: 6.6 10*3/uL (ref 1.7–7.7)
Neutrophils Relative %: 75 %
Platelets: 350 10*3/uL (ref 150–400)
RBC: 4.53 MIL/uL (ref 4.22–5.81)
RDW: 12.6 % (ref 11.5–15.5)
WBC: 8.7 10*3/uL (ref 4.0–10.5)
nRBC: 0 % (ref 0.0–0.2)

## 2020-06-22 LAB — COMPREHENSIVE METABOLIC PANEL
ALT: 19 U/L (ref 0–44)
AST: 27 U/L (ref 15–41)
Albumin: 3.9 g/dL (ref 3.5–5.0)
Alkaline Phosphatase: 89 U/L (ref 38–126)
Anion gap: 9 (ref 5–15)
BUN: 13 mg/dL (ref 8–23)
CO2: 25 mmol/L (ref 22–32)
Calcium: 8.9 mg/dL (ref 8.9–10.3)
Chloride: 100 mmol/L (ref 98–111)
Creatinine, Ser: 0.87 mg/dL (ref 0.61–1.24)
GFR, Estimated: >60 mL/min (ref >60)
Glucose, Bld: 135 mg/dL — ABNORMAL HIGH (ref 70–99)
Potassium: 4 mmol/L (ref 3.5–5.1)
Sodium: 134 mmol/L — ABNORMAL LOW (ref 135–145)
Total Bilirubin: 0.8 mg/dL (ref 0.3–1.2)
Total Protein: 8.1 g/dL (ref 6.5–8.1)

## 2020-06-22 LAB — IRON AND TIBC
Iron: 18 ug/dL — ABNORMAL LOW (ref 45–182)
Saturation Ratios: 6 % — ABNORMAL LOW (ref 17.9–39.5)
TIBC: 290 ug/dL (ref 250–450)
UIBC: 272 ug/dL

## 2020-06-22 LAB — FERRITIN: Ferritin: 260 ng/mL (ref 24–336)

## 2020-06-22 NOTE — Progress Notes (Signed)
Met with Mr. Schowalter and his spouse, Holley Raring. Introduced Therapist, nutritional and provided contact information for future needs. PET scan authorized and is being scheduled. We will try and obtain asap. Mr. Montminy has two daughters, who live around [place]. He is recently retired and assists in caring for his spouse. Reports weight loss of 30 lb since January 2021. He will have dietician referral. Her-2 neu and PD-L1 requested on [phone], esophageal biopsy.

## 2020-06-23 ENCOUNTER — Encounter: Payer: Self-pay | Admitting: Oncology

## 2020-06-23 ENCOUNTER — Telehealth: Payer: Self-pay

## 2020-06-23 ENCOUNTER — Other Ambulatory Visit: Payer: Self-pay

## 2020-06-23 DIAGNOSIS — C159 Malignant neoplasm of esophagus, unspecified: Secondary | ICD-10-CM

## 2020-06-23 DIAGNOSIS — Z7189 Other specified counseling: Secondary | ICD-10-CM | POA: Insufficient documentation

## 2020-06-23 HISTORY — DX: Malignant neoplasm of esophagus, unspecified: C15.9

## 2020-06-23 LAB — CEA: CEA: 102 ng/mL — ABNORMAL HIGH (ref 0.0–4.7)

## 2020-06-23 NOTE — H&P (View-Only) (Signed)
Hematology/Oncology Consult note [facility] Telephone:([phone] [phone]  Patient Care Team: Venia Carbon, MD as PCP - General [name], RN as Oncology Nurse Navigator   Name of the patient: Cory [name]  [id number]  [birth year]    Reason for referral-esophageal adenocarcinoma   Referring [place]  Date of visit: 06/23/20   History of presenting illness-  Patient is a 64 year old male who presented with symptoms of indigestion and heartburn which gradually progressed to dysphagia.  He underwent EGD on 06/20/2020 By Dr. Vicente Males which showed a large nearly obstructing mass in the lower third of the esophagus 35 cm from incisors.  This was biopsied and was consistent with adenocarcinoma.  Presently patient reports dysphagia but is able to eat cereal or soft food.  He has lost about 13 pounds in the last month itself.  Denies any significant pain.  He recently retired after many years of service and is otherwise independent of his ADLs and IADLs.  He lives with his wife.   ECOG PS- 1  Pain scale- 0   Review of systems- Review of Systems  Constitutional: Negative for chills, fever, malaise/fatigue and weight loss.  HENT: Negative for congestion, ear discharge and nosebleeds.   Eyes: Negative for blurred vision.  Respiratory: Negative for cough, hemoptysis, sputum production, shortness of breath and wheezing.   Cardiovascular: Negative for chest pain, palpitations, orthopnea and claudication.  Gastrointestinal: Negative for abdominal pain, blood in stool, constipation, diarrhea, heartburn, melena, nausea and vomiting.       Difficulty swallowing  Genitourinary: Negative for dysuria, flank pain, frequency, hematuria and urgency.  Musculoskeletal: Negative for back pain, joint pain and myalgias.  Skin: Negative for rash.  Neurological: Negative for dizziness, tingling, focal weakness, seizures, weakness and headaches.   Endo/Heme/Allergies: Does not bruise/bleed easily.  Psychiatric/Behavioral: Negative for depression and suicidal ideas. The patient does not have insomnia.     No Known Allergies  Patient Active Problem List   Diagnosis Date Noted  . Esophageal dysphagia 05/26/2020  . Erectile dysfunction 09/20/2013  . Routine general medical examination at a health care facility 09/13/2011  . Psoriasis 09/13/2011  . ALLERGIC RHINITIS 04/28/2009  . GERD 04/28/2009  . COLONIC POLYPS, HX OF 04/28/2009     Past Medical History:  Diagnosis Date  . GERD (gastroesophageal reflux disease)   . History of kidney stones   . Nephrolithiasis    [facility]  . Pancreatitis, acute   . Personal history of colonic polyps    Dr Nicolasa Ducking  . Psoriasis      Past Surgical History:  Procedure Laterality Date  . COLONOSCOPY    . COLONOSCOPY WITH PROPOFOL N/A 03/31/2020   Procedure: COLONOSCOPY WITH PROPOFOL;  Surgeon: Jonathon Bellows, MD;  Location: [facility] ENDOSCOPY;  Service: Gastroenterology;  Laterality: N/A;  . ERCP    . ESOPHAGOGASTRODUODENOSCOPY (EGD) WITH PROPOFOL N/A 06/19/2020   Procedure: ESOPHAGOGASTRODUODENOSCOPY (EGD) WITH PROPOFOL;  Surgeon: Jonathon Bellows, MD;  Location: [facility] ENDOSCOPY;  Service: Gastroenterology;  Laterality: N/A;  . groin surgery     as a child  . SHOULDER SURGERY      Social History   Socioeconomic History  . Marital status: Married    Spouse name: Not on file  . Number of children: 2  . Years of education: Not on file  . Highest education level: Not on file  Occupational History  . 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 Smoking  status: Former Smoker    Types: Cigarettes    Quit date: 07/08/1988    Years since quitting: 31.9  . Smokeless tobacco: Never Used  Vaping Use  . Vaping Use: Never used  Substance and Sexual Activity  . Alcohol use: No    Comment: recovering alcoholic for 13 years  . Drug use: No  . Sexual activity: Not on  file  Other Topics Concern  . Not on file  Social History Narrative  . Not on file   Social Determinants of Health   Financial Resource Strain: Not on file  Food Insecurity: Not on file  Transportation Needs: Not on file  Physical Activity: Not on file  Stress: Not on file  Social Connections: Not on file  Intimate Partner Violence: Not on file     Family History  Problem Relation Age of Onset  . Stomach cancer Mother   . Cancer Mother        stomach cancer  . Diabetes Brother   . Hypertension Brother   . Cancer Father        prostate cancer  . Hypertension Father   . Cancer Sister        colon cancer  . Colon cancer Neg Hx      Current Outpatient Medications:  .  pantoprazole (PROTONIX) 40 MG tablet, Take 1 tablet (40 mg total) by mouth 2 (two) times daily before a meal., Disp: 60 tablet, Rfl: 3 .  TREMFYA 100 MG/ML SOSY, Inject 1 Dose into the skin every 8 (eight) weeks. , Disp: , Rfl:    Physical exam: There were no vitals filed for this visit. Physical Exam Constitutional:      General: He is not in acute distress. Eyes:     Extraocular Movements: EOM normal.  Cardiovascular:     Rate and Rhythm: Normal rate and regular rhythm.     Heart sounds: Normal heart sounds.  Pulmonary:     Effort: Pulmonary effort is normal.     Breath sounds: Normal breath sounds.  Abdominal:     General: Bowel sounds are normal.     Palpations: Abdomen is soft.  Lymphadenopathy:     Comments: No palpable cervical, supraclavicular, axillary or inguinal adenopathy   Skin:    General: Skin is warm and dry.  Neurological:     Mental Status: He is alert and oriented to person, place, and time.        CMP Latest Ref Rng & Units 06/22/2020  Glucose 70 - 99 mg/dL 135(H)  BUN 8 - 23 mg/dL 13  Creatinine 0.61 - 1.24 mg/dL 0.87  Sodium 135 - 145 mmol/L 134(L)  Potassium 3.5 - 5.1 mmol/L 4.0  Chloride 98 - 111 mmol/L 100  CO2 22 - 32 mmol/L 25  Calcium 8.9 - 10.3 mg/dL 8.9   Total Protein 6.5 - 8.1 g/dL 8.1  Total Bilirubin 0.3 - 1.2 mg/dL 0.8  Alkaline Phos 38 - 126 U/L 89  AST 15 - 41 U/L 27  ALT 0 - 44 U/L 19   CBC Latest Ref Rng & Units 06/22/2020  WBC 4.0 - 10.5 K/uL 8.7  Hemoglobin 13.0 - 17.0 g/dL 12.6(L)  Hematocrit 39.0 - 52.0 % 38.6(L)  Platelets 150 - 400 K/uL 350    Assessment and plan- Patient is a 64 y.o. male with newly diagnosed GE junction adenocarcinoma of the esophagus  Discussed with the patient the results of the biopsy.  He will need a PET CT scan to  complete staging work-up.  If PET scan does not show any evidence of distant metastatic disease, he would be a potential surgical candidate in the future but would need neoadjuvant concurrent chemoradiation prior to that.  If PET scan shows evidence of distant metastatic disease likely treatment would be systemic chemotherapy plus minus immunotherapy plus minus trastuzumab and consideration for palliative radiation given his nearly obstructing lower esophageal mass.  I will plan to add PD-L1 CPS score testing as well as HER-2 testing to his pathology specimen.  But the patient has metastatic disease are not, systemic chemotherapy of some form is warranted at this time and therefore I will make arrangements for port placement.  I will discuss the details of chemotherapy after PET scan results are back.  Goals of care whether curative or palliative will depend on PET scan results.  I will check CBC with differential, CMP ferritin and iron studies and CEA today.  Patient is presently able to swallow soft food and have encouraged him not to eat any solid food or meat that has a potential to cause obstruction.  I have encouraged him to try Ensure 3-4 times a day to keep up with his calorie intake.  If patient continues to have difficulty swallowing he may have to consider putting in a feeding tube for him.  However if we are able to get by without a feeding tube until radiation can work there is  also an option.  I will tentatively see him next week for in person or video visit to discuss PET CT scan results and further management   Thank you for this kind referral and the opportunity to participate in the care of this patient   Visit Diagnosis 1. Esophageal adenocarcinoma ([place])     Dr. Randa Evens, MD, MPH [facility] [phone] 06/23/2020

## 2020-06-23 NOTE — Progress Notes (Signed)
Hematology/Oncology Consult note [facility] Telephone:([phone] [phone]  Patient Care Team: Venia Carbon, MD as PCP - General [name], RN as Oncology Nurse Navigator   Name of the patient: Cory [name]  [id number]  [birth year]    Reason for referral-esophageal adenocarcinoma   Referring [place]  Date of visit: 06/23/20   History of presenting illness-  Patient is a 64 year old male who presented with symptoms of indigestion and heartburn which gradually progressed to dysphagia.  He underwent EGD on 06/20/2020 By Dr. Vicente Males which showed a large nearly obstructing mass in the lower third of the esophagus 35 cm from incisors.  This was biopsied and was consistent with adenocarcinoma.  Presently patient reports dysphagia but is able to eat cereal or soft food.  He has lost about 13 pounds in the last month itself.  Denies any significant pain.  He recently retired after many years of service and is otherwise independent of his ADLs and IADLs.  He lives with his wife.   ECOG PS- 1  Pain scale- 0   Review of systems- Review of Systems  Constitutional: Negative for chills, fever, malaise/fatigue and weight loss.  HENT: Negative for congestion, ear discharge and nosebleeds.   Eyes: Negative for blurred vision.  Respiratory: Negative for cough, hemoptysis, sputum production, shortness of breath and wheezing.   Cardiovascular: Negative for chest pain, palpitations, orthopnea and claudication.  Gastrointestinal: Negative for abdominal pain, blood in stool, constipation, diarrhea, heartburn, melena, nausea and vomiting.       Difficulty swallowing  Genitourinary: Negative for dysuria, flank pain, frequency, hematuria and urgency.  Musculoskeletal: Negative for back pain, joint pain and myalgias.  Skin: Negative for rash.  Neurological: Negative for dizziness, tingling, focal weakness, seizures, weakness and headaches.   Endo/Heme/Allergies: Does not bruise/bleed easily.  Psychiatric/Behavioral: Negative for depression and suicidal ideas. The patient does not have insomnia.     No Known Allergies  Patient Active Problem List   Diagnosis Date Noted  . Esophageal dysphagia 05/26/2020  . Erectile dysfunction 09/20/2013  . Routine general medical examination at a health care facility 09/13/2011  . Psoriasis 09/13/2011  . ALLERGIC RHINITIS 04/28/2009  . GERD 04/28/2009  . COLONIC POLYPS, HX OF 04/28/2009     Past Medical History:  Diagnosis Date  . GERD (gastroesophageal reflux disease)   . History of kidney stones   . Nephrolithiasis    [facility]  . Pancreatitis, acute   . Personal history of colonic polyps    Dr Nicolasa Ducking  . Psoriasis      Past Surgical History:  Procedure Laterality Date  . COLONOSCOPY    . COLONOSCOPY WITH PROPOFOL N/A 03/31/2020   Procedure: COLONOSCOPY WITH PROPOFOL;  Surgeon: Jonathon Bellows, MD;  Location: [facility] ENDOSCOPY;  Service: Gastroenterology;  Laterality: N/A;  . ERCP    . ESOPHAGOGASTRODUODENOSCOPY (EGD) WITH PROPOFOL N/A 06/19/2020   Procedure: ESOPHAGOGASTRODUODENOSCOPY (EGD) WITH PROPOFOL;  Surgeon: Jonathon Bellows, MD;  Location: [facility] ENDOSCOPY;  Service: Gastroenterology;  Laterality: N/A;  . groin surgery     as a child  . SHOULDER SURGERY      Social History   Socioeconomic History  . Marital status: Married    Spouse name: Not on file  . Number of children: 2  . Years of education: Not on file  . Highest education level: Not on file  Occupational History  . 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 Smoking  status: Former Smoker    Types: Cigarettes    Quit date: 07/08/1988    Years since quitting: 31.9  . Smokeless tobacco: Never Used  Vaping Use  . Vaping Use: Never used  Substance and Sexual Activity  . Alcohol use: No    Comment: recovering alcoholic for 13 years  . Drug use: No  . Sexual activity: Not on  file  Other Topics Concern  . Not on file  Social History Narrative  . Not on file   Social Determinants of Health   Financial Resource Strain: Not on file  Food Insecurity: Not on file  Transportation Needs: Not on file  Physical Activity: Not on file  Stress: Not on file  Social Connections: Not on file  Intimate Partner Violence: Not on file     Family History  Problem Relation Age of Onset  . Stomach cancer Mother   . Cancer Mother        stomach cancer  . Diabetes Brother   . Hypertension Brother   . Cancer Father        prostate cancer  . Hypertension Father   . Cancer Sister        colon cancer  . Colon cancer Neg Hx      Current Outpatient Medications:  .  pantoprazole (PROTONIX) 40 MG tablet, Take 1 tablet (40 mg total) by mouth 2 (two) times daily before a meal., Disp: 60 tablet, Rfl: 3 .  TREMFYA 100 MG/ML SOSY, Inject 1 Dose into the skin every 8 (eight) weeks. , Disp: , Rfl:    Physical exam: There were no vitals filed for this visit. Physical Exam Constitutional:      General: He is not in acute distress. Eyes:     Extraocular Movements: EOM normal.  Cardiovascular:     Rate and Rhythm: Normal rate and regular rhythm.     Heart sounds: Normal heart sounds.  Pulmonary:     Effort: Pulmonary effort is normal.     Breath sounds: Normal breath sounds.  Abdominal:     General: Bowel sounds are normal.     Palpations: Abdomen is soft.  Lymphadenopathy:     Comments: No palpable cervical, supraclavicular, axillary or inguinal adenopathy   Skin:    General: Skin is warm and dry.  Neurological:     Mental Status: He is alert and oriented to person, place, and time.        CMP Latest Ref Rng & Units 06/22/2020  Glucose 70 - 99 mg/dL 135(H)  BUN 8 - 23 mg/dL 13  Creatinine 0.61 - 1.24 mg/dL 0.87  Sodium 135 - 145 mmol/L 134(L)  Potassium 3.5 - 5.1 mmol/L 4.0  Chloride 98 - 111 mmol/L 100  CO2 22 - 32 mmol/L 25  Calcium 8.9 - 10.3 mg/dL 8.9   Total Protein 6.5 - 8.1 g/dL 8.1  Total Bilirubin 0.3 - 1.2 mg/dL 0.8  Alkaline Phos 38 - 126 U/L 89  AST 15 - 41 U/L 27  ALT 0 - 44 U/L 19   CBC Latest Ref Rng & Units 06/22/2020  WBC 4.0 - 10.5 K/uL 8.7  Hemoglobin 13.0 - 17.0 g/dL 12.6(L)  Hematocrit 39.0 - 52.0 % 38.6(L)  Platelets 150 - 400 K/uL 350    Assessment and plan- Patient is a 64 y.o. male with newly diagnosed GE junction adenocarcinoma of the esophagus  Discussed with the patient the results of the biopsy.  He will need a PET CT scan to  complete staging work-up.  If PET scan does not show any evidence of distant metastatic disease, he would be a potential surgical candidate in the future but would need neoadjuvant concurrent chemoradiation prior to that.  If PET scan shows evidence of distant metastatic disease likely treatment would be systemic chemotherapy plus minus immunotherapy plus minus trastuzumab and consideration for palliative radiation given his nearly obstructing lower esophageal mass.  I will plan to add PD-L1 CPS score testing as well as HER-2 testing to his pathology specimen.  But the patient has metastatic disease are not, systemic chemotherapy of some form is warranted at this time and therefore I will make arrangements for port placement.  I will discuss the details of chemotherapy after PET scan results are back.  Goals of care whether curative or palliative will depend on PET scan results.  I will check CBC with differential, CMP ferritin and iron studies and CEA today.  Patient is presently able to swallow soft food and have encouraged him not to eat any solid food or meat that has a potential to cause obstruction.  I have encouraged him to try Ensure 3-4 times a day to keep up with his calorie intake.  If patient continues to have difficulty swallowing he may have to consider putting in a feeding tube for him.  However if we are able to get by without a feeding tube until radiation can work there is  also an option.  I will tentatively see him next week for in person or video visit to discuss PET CT scan results and further management   Thank you for this kind referral and the opportunity to participate in the care of this patient   Visit Diagnosis 1. Esophageal adenocarcinoma ([place])     Dr. Randa Evens, MD, MPH [facility] [phone] 06/23/2020

## 2020-06-23 NOTE — Telephone Encounter (Signed)
Call placed to [name] to go over all upcoming appointments and instructions. Reviewed COVID test, PET, PAT, video visit/Dr [name], PORT placement and  radiation consult. He has not used his [facility] MyChart since 2014. Attempted to get him back into his account. He was able to get to the home page and will try to log in. Encouraged to call for further assist.

## 2020-06-26 ENCOUNTER — Ambulatory Visit (HOSPITAL_COMMUNITY)
Admission: RE | Admit: 2020-06-26 | Discharge: 2020-06-26 | Disposition: A | Discharge status: Home / Self Care | Payer: Medicare Other | Source: Ambulatory Visit | Attending: Oncology | Admitting: Oncology

## 2020-06-26 ENCOUNTER — Other Ambulatory Visit: Payer: Self-pay | Admitting: Oncology

## 2020-06-26 ENCOUNTER — Ambulatory Visit (HOSPITAL_COMMUNITY): Admission: RE | Admit: 2020-06-26 | Payer: Medicare Other | Source: Ambulatory Visit

## 2020-06-26 ENCOUNTER — Other Ambulatory Visit
Admission: RE | Admit: 2020-06-26 | Discharge: 2020-06-26 | Disposition: A | Discharge status: Home / Self Care | Payer: Medicare Other | Source: Ambulatory Visit | Attending: Vascular Surgery | Admitting: Vascular Surgery

## 2020-06-26 ENCOUNTER — Other Ambulatory Visit: Payer: Self-pay

## 2020-06-26 ENCOUNTER — Telehealth: Payer: Self-pay

## 2020-06-26 DIAGNOSIS — N200 Calculus of kidney: Secondary | ICD-10-CM | POA: Insufficient documentation

## 2020-06-26 DIAGNOSIS — C159 Malignant neoplasm of esophagus, unspecified: Secondary | ICD-10-CM

## 2020-06-26 DIAGNOSIS — K769 Liver disease, unspecified: Secondary | ICD-10-CM | POA: Diagnosis not present

## 2020-06-26 DIAGNOSIS — Z01812 Encounter for preprocedural laboratory examination: Secondary | ICD-10-CM | POA: Insufficient documentation

## 2020-06-26 DIAGNOSIS — Z20822 Contact with and (suspected) exposure to covid-19: Secondary | ICD-10-CM | POA: Diagnosis not present

## 2020-06-26 DIAGNOSIS — I700 Atherosclerosis of aorta: Secondary | ICD-10-CM | POA: Insufficient documentation

## 2020-06-26 LAB — SARS CORONAVIRUS 2 (TAT 6-24 HRS): SARS Coronavirus 2: NEGATIVE

## 2020-06-26 LAB — GLUCOSE, CAPILLARY: Glucose-Capillary: 68 mg/dL — ABNORMAL LOW (ref 70–99)

## 2020-06-26 MED ORDER — FLUDEOXYGLUCOSE F - 18 (FDG) INJECTION
10.8000 | Freq: Once | INTRAVENOUS | Status: AC
  Administered 2020-06-26: 10.8 via INTRAVENOUS

## 2020-06-26 NOTE — Telephone Encounter (Signed)
Request for [facility] sent on [phone], esophageal biopsy.

## 2020-06-27 ENCOUNTER — Other Ambulatory Visit
Admission: RE | Admit: 2020-06-27 | Discharge: 2020-06-27 | Disposition: A | Discharge status: Home / Self Care | Payer: Medicare Other | Source: Ambulatory Visit | Attending: Vascular Surgery | Admitting: Vascular Surgery

## 2020-06-27 ENCOUNTER — Telehealth: Payer: Self-pay | Admitting: *Deleted

## 2020-06-27 NOTE — Telephone Encounter (Signed)
Patient called reporting that ever since his PET Scan yesterday, he has been running fever up to 100.7. He denies any chills or other symptoms. Please advise

## 2020-06-27 NOTE — Patient Instructions (Signed)
Your procedure is scheduled on: Wednesday June 28, 2020. Report to Day Surgery inside [facility] 2nd floor (stop by Registration desk first). To find out your arrival time please call [phone] between 1PM - 3PM on Tuesday June 27, 2020.  Remember: Instructions that are not followed completely may result in serious medical risk,  up to and including death, or upon the discretion of your surgeon and anesthesiologist your  surgery may need to be rescheduled.     _X__ 1. Do not eat food after midnight the night before your procedure.                 No chewing gum or hard candies. You may drink clear liquids up to 2 hours                 before you are scheduled to arrive for your surgery- DO not drink clear                 liquids within 2 hours of the start of your surgery.                 Clear Liquids include:  water, apple juice without pulp, clear Gatorade, G2 or                  Gatorade Zero (avoid Red/Purple/Blue), Black Coffee or Tea (Do not add                 anything to coffee or tea).  __X__2.  On the morning of surgery brush your teeth with toothpaste and water, you                may rinse your mouth with mouthwash if you wish.  Do not swallow any toothpaste of mouthwash.     _X__ 3.  No Alcohol for 24 hours before or after surgery.   _X__ 4.  Do Not Smoke or use e-cigarettes For 24 Hours Prior to Your Surgery.                 Do not use any chewable tobacco products for at least 6 hours prior to                 Surgery.  _X__  5.  Do not use any recreational drugs (marijuana, cocaine, heroin, ecstasy, MDMA or other)                For at least one week prior to your surgery.  Combination of these drugs with anesthesia                May have life threatening results.  _X___ 6.  Notify your doctor if there is any change in your medical condition      (cold, fever, infections).     Do not wear jewelry, make-up, hairpins, clips or nail  polish. Do not wear lotions, powders, or perfumes. You may wear deodorant. Do not shave 48 hours prior to surgery. Men may shave face and neck. Do not bring valuables to the hospital.    [facility] is not responsible for any belongings or valuables.  Contacts, dentures or bridgework may not be worn into surgery. Leave your suitcase in the car. After surgery it may be brought to your room. For patients admitted to the hospital, discharge time is determined by your treatment team.   Patients discharged the day of surgery will not be allowed to drive home.   Make  arrangements for someone to be with you for the first 24 hours of your Same Day Discharge.   __x__ Take these medicines the morning of surgery with A SIP OF WATER:    1. pantoprazole (PROTONIX) 40 MG   ____ Fleet Enema (as directed)   __x__ Use antibacterial Soap as directed  ____ Use Benzoyl Peroxide Gel as instructed  ____ Use inhalers on the day of surgery  ____ Stop metformin 2 days prior to surgery    ____ Take 1/2 of usual insulin dose the night before surgery. No insulin the morning          of surgery.   __x__ Stop Anti-inflammatories such as Ibuprofen, Aleve, Advil, aspirin and or BC powders.    __x__ Stop supplements until after surgery.    __x__ Do not start any herbal supplements before your procedure.   If you have any questions regarding your pre-procedure instructions,  Please call Pre-admit Testing at [phone].

## 2020-06-28 ENCOUNTER — Ambulatory Visit
Admission: RE | Admit: 2020-06-28 | Discharge: 2020-06-28 | Disposition: A | Discharge status: Home / Self Care | Payer: Medicare Other | Attending: Vascular Surgery | Admitting: Vascular Surgery

## 2020-06-28 ENCOUNTER — Encounter: Payer: Self-pay | Admitting: Vascular Surgery

## 2020-06-28 ENCOUNTER — Encounter
Admission: RE | Disposition: A | Discharge status: Home / Self Care | Payer: Self-pay | Source: Home / Self Care | Attending: Vascular Surgery

## 2020-06-28 ENCOUNTER — Inpatient Hospital Stay (HOSPITAL_BASED_OUTPATIENT_CLINIC_OR_DEPARTMENT_OTHER): Payer: Medicare Other | Admitting: Oncology

## 2020-06-28 ENCOUNTER — Other Ambulatory Visit: Payer: Self-pay | Admitting: *Deleted

## 2020-06-28 ENCOUNTER — Other Ambulatory Visit: Payer: Self-pay

## 2020-06-28 ENCOUNTER — Other Ambulatory Visit (INDEPENDENT_AMBULATORY_CARE_PROVIDER_SITE_OTHER): Payer: Self-pay | Admitting: Nurse Practitioner

## 2020-06-28 DIAGNOSIS — Z87891 Personal history of nicotine dependence: Secondary | ICD-10-CM | POA: Insufficient documentation

## 2020-06-28 DIAGNOSIS — C159 Malignant neoplasm of esophagus, unspecified: Secondary | ICD-10-CM

## 2020-06-28 DIAGNOSIS — Z7189 Other specified counseling: Secondary | ICD-10-CM | POA: Diagnosis not present

## 2020-06-28 DIAGNOSIS — C160 Malignant neoplasm of cardia: Secondary | ICD-10-CM | POA: Insufficient documentation

## 2020-06-28 HISTORY — PX: PORTA CATH INSERTION: CATH118285

## 2020-06-28 SURGERY — PORTA CATH INSERTION
Anesthesia: Moderate Sedation

## 2020-06-28 MED ORDER — LORAZEPAM 0.5 MG PO TABS: 0.5000 mg | ORAL_TABLET | Freq: Four times a day (QID) | ORAL | 0 refills | Status: DC | PRN

## 2020-06-28 MED ORDER — FENTANYL CITRATE (PF) 100 MCG/2ML IJ SOLN
INTRAMUSCULAR | Status: AC
  Filled 2020-06-28: qty 2

## 2020-06-28 MED ORDER — MIDAZOLAM HCL 5 MG/5ML IJ SOLN
INTRAMUSCULAR | Status: AC
  Filled 2020-06-28: qty 5

## 2020-06-28 MED ORDER — CHLORHEXIDINE GLUCONATE CLOTH 2 % EX PADS
6.0000 | MEDICATED_PAD | Freq: Every day | CUTANEOUS | Status: DC
  Administered 2020-06-28: 6 via TOPICAL

## 2020-06-28 MED ORDER — SODIUM CHLORIDE 0.9 % IV SOLN: INTRAVENOUS | Status: DC

## 2020-06-28 MED ORDER — FENTANYL CITRATE (PF) 100 MCG/2ML IJ SOLN
INTRAMUSCULAR | Status: DC | PRN
  Administered 2020-06-28: 50 ug via INTRAVENOUS

## 2020-06-28 MED ORDER — ONDANSETRON HCL 8 MG PO TABS: 8.0000 mg | ORAL_TABLET | Freq: Two times a day (BID) | ORAL | 1 refills | Status: DC | PRN

## 2020-06-28 MED ORDER — LIDOCAINE-PRILOCAINE 2.5-2.5 % EX CREA: TOPICAL_CREAM | CUTANEOUS | 3 refills | Status: DC

## 2020-06-28 MED ORDER — METHYLPREDNISOLONE SODIUM SUCC 125 MG IJ SOLR: 125.0000 mg | Freq: Once | INTRAMUSCULAR | Status: DC | PRN

## 2020-06-28 MED ORDER — MIDAZOLAM HCL 2 MG/ML PO SYRP: 8.0000 mg | ORAL_SOLUTION | Freq: Once | ORAL | Status: DC | PRN

## 2020-06-28 MED ORDER — HYDROMORPHONE HCL 1 MG/ML IJ SOLN: 1.0000 mg | Freq: Once | INTRAMUSCULAR | Status: DC | PRN

## 2020-06-28 MED ORDER — ONDANSETRON HCL 4 MG/2ML IJ SOLN: 4.0000 mg | Freq: Four times a day (QID) | INTRAMUSCULAR | Status: DC | PRN

## 2020-06-28 MED ORDER — DEXAMETHASONE 4 MG PO TABS: 8.0000 mg | ORAL_TABLET | Freq: Every day | ORAL | 1 refills | Status: DC

## 2020-06-28 MED ORDER — FAMOTIDINE 20 MG PO TABS: 40.0000 mg | ORAL_TABLET | Freq: Once | ORAL | Status: DC | PRN

## 2020-06-28 MED ORDER — SODIUM CHLORIDE 0.9 % IV SOLN
Freq: Once | INTRAVENOUS | Status: DC
  Filled 2020-06-28: qty 2

## 2020-06-28 MED ORDER — MIDAZOLAM HCL 2 MG/2ML IJ SOLN
INTRAMUSCULAR | Status: DC | PRN
  Administered 2020-06-28: 2 mg via INTRAVENOUS

## 2020-06-28 MED ORDER — PROCHLORPERAZINE MALEATE 10 MG PO TABS: 10.0000 mg | ORAL_TABLET | Freq: Four times a day (QID) | ORAL | 1 refills | Status: DC | PRN

## 2020-06-28 MED ORDER — DIPHENHYDRAMINE HCL 50 MG/ML IJ SOLN: 50.0000 mg | Freq: Once | INTRAMUSCULAR | Status: DC | PRN

## 2020-06-28 MED ORDER — CEFAZOLIN SODIUM-DEXTROSE 2-4 GM/100ML-% IV SOLN
2.0000 g | Freq: Once | INTRAVENOUS | Status: AC
  Administered 2020-06-28: 2 g via INTRAVENOUS

## 2020-06-28 SURGICAL SUPPLY — 8 items
ADH SKN CLS APL DERMABOND .7 (GAUZE/BANDAGES/DRESSINGS) ×1
DERMABOND ADVANCED (GAUZE/BANDAGES/DRESSINGS) ×1
DERMABOND ADVANCED .7 DNX12 (GAUZE/BANDAGES/DRESSINGS) IMPLANT
KIT PORT POWER 8FR ISP CVUE (Port) ×1 IMPLANT
PACK ANGIOGRAPHY (CUSTOM PROCEDURE TRAY) ×2 IMPLANT
SPONGE XRAY 4X4 16PLY STRL (MISCELLANEOUS) ×1 IMPLANT
SUT MNCRL AB 4-0 PS2 18 (SUTURE) ×1 IMPLANT
SUT VICRYL+ 3-0 36IN CT-1 (SUTURE) ×1 IMPLANT

## 2020-06-28 NOTE — Telephone Encounter (Signed)
I called pt's house today. I spoke to his daughterArville [name]. She states that the fever broke at 7 pm yest. He states that the highest it got was 100.2. He is fine and today temp is 97. I told daughter and pt that if fever comes back to let us know and gave her direct number to clinical line and after hours line. Pt to have port inserted today. They will proceed.

## 2020-06-28 NOTE — Op Note (Signed)
      [facility]       Operative Note  Date: 06/28/2020  Preoperative diagnosis:  1. Esophageal cancer  Postoperative diagnosis:  Same as above  Procedures: #1. Ultrasound guidance for vascular access to the right internal jugular vein. #2. Fluoroscopic guidance for placement of catheter. #3. Placement of CT compatible Port-A-Cath, right internal jugular vein.  Surgeon: Leotis Pain, MD.   Anesthesia: Local with moderate conscious sedation for approximately 18  minutes using 2 mg of Versed and 50 mcg of Fentanyl  Fluoroscopy time: less than 1 minute  Contrast used: 0  Estimated blood loss: 5 cc  Indication for the procedure:  The patient is a 64 y.o.male with esophageal cancer.  The patient needs a Port-A-Cath for durable venous access, chemotherapy, lab draws, and CT scans. We are asked to place this. Risks and benefits were discussed and informed consent was obtained.  Description of procedure: The patient was brought to the vascular and interventional radiology suite.  Moderate conscious sedation was administered throughout the procedure during a face to face encounter with the patient with my supervision of the RN administering medicines and monitoring the patient's vital signs, pulse oximetry, telemetry and mental status throughout from the start of the procedure until the patient was taken to the recovery room. The right neck chest and shoulder were sterilely prepped and draped, and a sterile surgical field was created. Ultrasound was used to help visualize a patent right internal jugular vein. This was then accessed under direct ultrasound guidance without difficulty with the Seldinger needle and a permanent image was recorded. A J-wire was placed. After skin nick and dilatation, the peel-away sheath was then placed over the wire. I then anesthetized an area under the clavicle approximately 1-2 fingerbreadths. A transverse incision was created and an inferior  pocket was created with electrocautery and blunt dissection. The port was then brought onto the field, placed into the pocket and secured to the chest wall with 2 Prolene sutures. The catheter was connected to the port and tunneled from the subclavicular incision to the access site. Fluoroscopic guidance was then used to cut the catheter to an appropriate length. The catheter was then placed through the peel-away sheath and the peel-away sheath was removed. The catheter tip was parked in excellent location under fluorocoscopic guidance in the cavoatrial junction. The pocket was then irrigated with antibiotic impregnated saline and the wound was closed with a running 3-0 Vicryl and a 4-0 Monocryl. The access incision was closed with a single 4-0 Monocryl. The Huber needle was used to withdraw blood and flush the port with heparinized saline. Dermabond was then placed as a dressing. The patient tolerated the procedure well and was taken to the recovery room in stable condition.   Leotis Pain 06/28/2020 12:15 PM   This note was created with Dragon Medical transcription system. Any errors in dictation are purely unintentional.

## 2020-06-28 NOTE — Progress Notes (Signed)
START ON PATHWAY REGIMEN - Gastroesophageal     A cycle is every 14 days:     Oxaliplatin      Leucovorin      Fluorouracil      Fluorouracil   **Always confirm dose/schedule in your pharmacy ordering system**  Patient Characteristics: Distant Metastases (cM1/pM1) / Locally Recurrent Disease, Adenocarcinoma - Esophageal, GE Junction, and Gastric, First Line, HER2 Negative/Unknown, PD?L1 Expression CPS < 5/Negative/Unknown, MSS/pMMR or MSI Unknown Histology: Adenocarcinoma Disease Classification: GE Junction Therapeutic Status: Distant Metastases (No Additional Staging) Line of Therapy: First Line HER2 Status: Awaiting Test Results PD-L1 Expression Status: Awaiting Test Results Microsatellite/Mismatch Repair Status: Unknown Intent of Therapy: Non-Curative / Palliative Intent, Discussed with Patient

## 2020-06-28 NOTE — Interval H&P Note (Signed)
History and Physical Interval Note:  06/28/2020 11:11 AM  Cory [name]  has presented today for surgery, with the diagnosis of Port Placement   Esophageal Adenocarcinoma   Pt to have Covid test on  12-20.  The various methods of treatment have been discussed with the patient and family. After consideration of risks, benefits and other options for treatment, the patient has consented to  Procedure(s): PORTA CATH INSERTION (N/A) as a surgical intervention.  The patient's history has been reviewed, patient examined, no change in status, stable for surgery.  I have reviewed the patient's chart and labs.  Questions were answered to the patient's satisfaction.     Leotis Pain

## 2020-06-28 NOTE — Patient Instructions (Signed)
Oxaliplatin Injection What is this medicine? OXALIPLATIN (ox AL i PLA tin) is a chemotherapy drug. It targets fast dividing cells, like cancer cells, and causes these cells to die. This medicine is used to treat cancers of the colon and rectum, and many other cancers. This medicine may be used for other purposes; ask your health care provider or pharmacist if you have questions. COMMON BRAND NAME(S): Eloxatin What should I tell my health care provider before I take this medicine? They need to know if you have any of these conditions:  heart disease  history of irregular heartbeat  liver disease  low blood counts, like white cells, platelets, or red blood cells  lung or breathing disease, like asthma  take medicines that treat or prevent blood clots  tingling of the fingers or toes, or other nerve disorder  an unusual or allergic reaction to oxaliplatin, other chemotherapy, other medicines, foods, dyes, or preservatives  pregnant or trying to get pregnant  breast-feeding How should I use this medicine? This drug is given as an infusion into a vein. It is administered in a hospital or clinic by a specially trained health care professional. Talk to your pediatrician regarding the use of this medicine in children. Special care may be needed. Overdosage: If you think you have taken too much of this medicine contact a poison control center or emergency room at once. NOTE: This medicine is only for you. Do not share this medicine with others. What if I miss a dose? It is important not to miss a dose. Call your doctor or health care professional if you are unable to keep an appointment. What may interact with this medicine? Do not take this medicine with any of the following medications:  cisapride  dronedarone  pimozide  thioridazine This medicine may also interact with the following medications:  aspirin and aspirin-like medicines  certain medicines that treat or prevent  blood clots like warfarin, apixaban, dabigatran, and rivaroxaban  cisplatin  cyclosporine  diuretics  medicines for infection like acyclovir, adefovir, amphotericin B, bacitracin, cidofovir, foscarnet, ganciclovir, gentamicin, pentamidine, vancomycin  NSAIDs, medicines for pain and inflammation, like ibuprofen or naproxen  other medicines that prolong the QT interval (an abnormal heart rhythm)  pamidronate  zoledronic acid This list may not describe all possible interactions. Give your health care provider a list of all the medicines, herbs, non-prescription drugs, or dietary supplements you use. Also tell them if you smoke, drink alcohol, or use illegal drugs. Some items may interact with your medicine. What should I watch for while using this medicine? Your condition will be monitored carefully while you are receiving this medicine. You may need blood work done while you are taking this medicine. This medicine may make you feel generally unwell. This is not uncommon as chemotherapy can affect healthy cells as well as cancer cells. Report any side effects. Continue your course of treatment even though you feel ill unless your healthcare professional tells you to stop. This medicine can make you more sensitive to cold. Do not drink cold drinks or use ice. Cover exposed skin before coming in contact with cold temperatures or cold objects. When out in cold weather wear warm clothing and cover your mouth and nose to warm the air that goes into your lungs. Tell your doctor if you get sensitive to the cold. Do not become pregnant while taking this medicine or for 9 months after stopping it. Women should inform their health care professional if they wish to become   pregnant or think they might be pregnant. Men should not father a child while taking this medicine and for 6 months after stopping it. There is potential for serious side effects to an unborn child. Talk to your health care professional  for more information. Do not breast-feed a child while taking this medicine or for 3 months after stopping it. This medicine has caused ovarian failure in some women. This medicine may make it more difficult to get pregnant. Talk to your health care professional if you are concerned about your fertility. This medicine has caused decreased sperm counts in some men. This may make it more difficult to father a child. Talk to your health care professional if you are concerned about your fertility. This medicine may increase your risk of getting an infection. Call your health care professional for advice if you get a fever, chills, or sore throat, or other symptoms of a cold or flu. Do not treat yourself. Try to avoid being around people who are sick. Avoid taking medicines that contain aspirin, acetaminophen, ibuprofen, naproxen, or ketoprofen unless instructed by your health care professional. These medicines may hide a fever. Be careful brushing or flossing your teeth or using a toothpick because you may get an infection or bleed more easily. If you have any dental work done, tell your dentist you are receiving this medicine. What side effects may I notice from receiving this medicine? Side effects that you should report to your doctor or health care professional as soon as possible:  allergic reactions like skin rash, itching or hives, swelling of the face, lips, or tongue  breathing problems  cough  low blood counts - this medicine may decrease the number of white blood cells, red blood cells, and platelets. You may be at increased risk for infections and bleeding  nausea, vomiting  pain, redness, or irritation at site where injected  pain, tingling, numbness in the hands or feet  signs and symptoms of bleeding such as bloody or black, tarry stools; red or dark brown urine; spitting up blood or brown material that looks like coffee grounds; red spots on the skin; unusual bruising or bleeding  from the eyes, gums, or nose  signs and symptoms of a dangerous change in heartbeat or heart rhythm like chest pain; dizziness; fast, irregular heartbeat; palpitations; feeling faint or lightheaded; falls  signs and symptoms of infection like fever; chills; cough; sore throat; pain or trouble passing urine  signs and symptoms of liver injury like dark yellow or brown urine; general ill feeling or flu-like symptoms; light-colored stools; loss of appetite; nausea; right upper belly pain; unusually weak or tired; yellowing of the eyes or skin  signs and symptoms of low red blood cells or anemia such as unusually weak or tired; feeling faint or lightheaded; falls  signs and symptoms of muscle injury like dark urine; trouble passing urine or change in the amount of urine; unusually weak or tired; muscle pain; back pain Side effects that usually do not require medical attention (report to your doctor or health care professional if they continue or are bothersome):  changes in taste  diarrhea  gas  hair loss  loss of appetite  mouth sores This list may not describe all possible side effects. Call your doctor for medical advice about side effects. You may report side effects to FDA at 1-800-FDA-1088. Where should I keep my medicine? This drug is given in a hospital or clinic and will not be stored at home. NOTE:   This sheet is a summary. It may not cover all possible information. If you have questions about this medicine, talk to your doctor, pharmacist, or health care provider.  2020 Elsevier/Gold Standard (2018-11-11 12:20:35) Fluorouracil, 5-FU injection What is this medicine? FLUOROURACIL, 5-FU (flure oh YOOR a sil) is a chemotherapy drug. It slows the growth of cancer cells. This medicine is used to treat many types of cancer like breast cancer, colon or rectal cancer, pancreatic cancer, and stomach cancer. This medicine may be used for other purposes; ask your health care provider or  pharmacist if you have questions. COMMON BRAND NAME(S): Adrucil What should I tell my health care provider before I take this medicine? They need to know if you have any of these conditions:  blood disorders  dihydropyrimidine dehydrogenase (DPD) deficiency  infection (especially a virus infection such as chickenpox, cold sores, or herpes)  kidney disease  liver disease  malnourished, poor nutrition  recent or ongoing radiation therapy  an unusual or allergic reaction to fluorouracil, other chemotherapy, other medicines, foods, dyes, or preservatives  pregnant or trying to get pregnant  breast-feeding How should I use this medicine? This drug is given as an infusion or injection into a vein. It is administered in a hospital or clinic by a specially trained health care professional. Talk to your pediatrician regarding the use of this medicine in children. Special care may be needed. Overdosage: If you think you have taken too much of this medicine contact a poison control center or emergency room at once. NOTE: This medicine is only for you. Do not share this medicine with others. What if I miss a dose? It is important not to miss your dose. Call your doctor or health care professional if you are unable to keep an appointment. What may interact with this medicine?  allopurinol  cimetidine  dapsone  digoxin  hydroxyurea  leucovorin  levamisole  medicines for seizures like ethotoin, fosphenytoin, phenytoin  medicines to increase blood counts like filgrastim, pegfilgrastim, sargramostim  medicines that treat or prevent blood clots like warfarin, enoxaparin, and dalteparin  methotrexate  metronidazole  pyrimethamine  some other chemotherapy drugs like busulfan, cisplatin, estramustine, vinblastine  trimethoprim  trimetrexate  vaccines Talk to your doctor or health care professional before taking any of these  medicines:  acetaminophen  aspirin  ibuprofen  ketoprofen  naproxen This list may not describe all possible interactions. Give your health care provider a list of all the medicines, herbs, non-prescription drugs, or dietary supplements you use. Also tell them if you smoke, drink alcohol, or use illegal drugs. Some items may interact with your medicine. What should I watch for while using this medicine? Visit your doctor for checks on your progress. This drug may make you feel generally unwell. This is not uncommon, as chemotherapy can affect healthy cells as well as cancer cells. Report any side effects. Continue your course of treatment even though you feel ill unless your doctor tells you to stop. In some cases, you may be given additional medicines to help with side effects. Follow all directions for their use. Call your doctor or health care professional for advice if you get a fever, chills or sore throat, or other symptoms of a cold or flu. Do not treat yourself. This drug decreases your body's ability to fight infections. Try to avoid being around people who are sick. This medicine may increase your risk to bruise or bleed. Call your doctor or health care professional if you notice any   unusual bleeding. Be careful brushing and flossing your teeth or using a toothpick because you may get an infection or bleed more easily. If you have any dental work done, tell your dentist you are receiving this medicine. Avoid taking products that contain aspirin, acetaminophen, ibuprofen, naproxen, or ketoprofen unless instructed by your doctor. These medicines may hide a fever. Do not become pregnant while taking this medicine. Women should inform their doctor if they wish to become pregnant or think they might be pregnant. There is a potential for serious side effects to an unborn child. Talk to your health care professional or pharmacist for more information. Do not breast-feed an infant while taking  this medicine. Men should inform their doctor if they wish to father a child. This medicine may lower sperm counts. Do not treat diarrhea with over the counter products. Contact your doctor if you have diarrhea that lasts more than 2 days or if it is severe and watery. This medicine can make you more sensitive to the sun. Keep out of the sun. If you cannot avoid being in the sun, wear protective clothing and use sunscreen. Do not use sun lamps or tanning beds/booths. What side effects may I notice from receiving this medicine? Side effects that you should report to your doctor or health care professional as soon as possible:  allergic reactions like skin rash, itching or hives, swelling of the face, lips, or tongue  low blood counts - this medicine may decrease the number of white blood cells, red blood cells and platelets. You may be at increased risk for infections and bleeding.  signs of infection - fever or chills, cough, sore throat, pain or difficulty passing urine  signs of decreased platelets or bleeding - bruising, pinpoint red spots on the skin, black, tarry stools, blood in the urine  signs of decreased red blood cells - unusually weak or tired, fainting spells, lightheadedness  breathing problems  changes in vision  chest pain  mouth sores  nausea and vomiting  pain, swelling, redness at site where injected  pain, tingling, numbness in the hands or feet  redness, swelling, or sores on hands or feet  stomach pain  unusual bleeding Side effects that usually do not require medical attention (report to your doctor or health care professional if they continue or are bothersome):  changes in finger or toe nails  diarrhea  dry or itchy skin  hair loss  headache  loss of appetite  sensitivity of eyes to the light  stomach upset  unusually teary eyes This list may not describe all possible side effects. Call your doctor for medical advice about side effects.  You may report side effects to FDA at 1-800-FDA-1088. Where should I keep my medicine? This drug is given in a hospital or clinic and will not be stored at home. NOTE: This sheet is a summary. It may not cover all possible information. If you have questions about this medicine, talk to your doctor, pharmacist, or health care provider.  2020 Elsevier/Gold Standard (2007-10-28 13:53:16) Leucovorin injection What is this medicine? LEUCOVORIN (loo koe VOR in) is used to prevent or treat the harmful effects of some medicines. This medicine is used to treat anemia caused by a low amount of folic acid in the body. It is also used with 5-fluorouracil (5-FU) to treat colon cancer. This medicine may be used for other purposes; ask your health care provider or pharmacist if you have questions. What should I tell my health care   provider before I take this medicine? They need to know if you have any of these conditions:  anemia from low levels of vitamin B-12 in the blood  an unusual or allergic reaction to leucovorin, folic acid, other medicines, foods, dyes, or preservatives  pregnant or trying to get pregnant  breast-feeding How should I use this medicine? This medicine is for injection into a muscle or into a vein. It is given by a health care professional in a hospital or clinic setting. Talk to your pediatrician regarding the use of this medicine in children. Special care may be needed. Overdosage: If you think you have taken too much of this medicine contact a poison control center or emergency room at once. NOTE: This medicine is only for you. Do not share this medicine with others. What if I miss a dose? This does not apply. What may interact with this medicine?  capecitabine  fluorouracil  phenobarbital  phenytoin  primidone  trimethoprim-sulfamethoxazole This list may not describe all possible interactions. Give your health care provider a list of all the medicines, herbs,  non-prescription drugs, or dietary supplements you use. Also tell them if you smoke, drink alcohol, or use illegal drugs. Some items may interact with your medicine. What should I watch for while using this medicine? Your condition will be monitored carefully while you are receiving this medicine. This medicine may increase the side effects of 5-fluorouracil, 5-FU. Tell your doctor or health care professional if you have diarrhea or mouth sores that do not get better or that get worse. What side effects may I notice from receiving this medicine? Side effects that you should report to your doctor or health care professional as soon as possible:  allergic reactions like skin rash, itching or hives, swelling of the face, lips, or tongue  breathing problems  fever, infection  mouth sores  unusual bleeding or bruising  unusually weak or tired Side effects that usually do not require medical attention (report to your doctor or health care professional if they continue or are bothersome):  constipation or diarrhea  loss of appetite  nausea, vomiting This list may not describe all possible side effects. Call your doctor for medical advice about side effects. You may report side effects to FDA at 1-800-FDA-1088. Where should I keep my medicine? This drug is given in a hospital or clinic and will not be stored at home. NOTE: This sheet is a summary. It may not cover all possible information. If you have questions about this medicine, talk to your doctor, pharmacist, or health care provider.  2020 Elsevier/Gold Standard (2007-12-29 16:50:29)  

## 2020-06-29 ENCOUNTER — Other Ambulatory Visit: Payer: Self-pay | Admitting: *Deleted

## 2020-06-29 ENCOUNTER — Inpatient Hospital Stay: Payer: Medicare Other

## 2020-06-29 ENCOUNTER — Encounter: Payer: Self-pay | Admitting: Oncology

## 2020-06-29 MED ORDER — LIDOCAINE-PRILOCAINE 2.5-2.5 % EX CREA: 1.0000 "application " | TOPICAL_CREAM | CUTANEOUS | 3 refills | Status: DC | PRN

## 2020-06-29 MED ORDER — LIDOCAINE-PRILOCAINE 2.5-2.5 % EX CREA: 1.0000 "application " | TOPICAL_CREAM | CUTANEOUS | 0 refills | Status: DC

## 2020-06-29 NOTE — Telephone Encounter (Signed)
Patient pharmacy does not have EMLA cream in stock and does not know when they can get it. Daughter has agreed to have me check at other pharmacies for prescription. I called [facility] and they have it in stock. New prescription sumitted to [facility]

## 2020-06-29 NOTE — Progress Notes (Signed)
I connected with Cory [name] on 06/29/20 at  9:00 AM EST by video enabled telemedicine visit and verified that I am speaking with the correct person using two identifiers.   I discussed the limitations, risks, security and privacy concerns of performing an evaluation and management service by telemedicine and the availability of in-person appointments. I also discussed with the patient that there may be a patient responsible charge related to this service. The patient expressed understanding and agreed to proceed.  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  He underwent EGD on 06/20/2020 By Dr. Vicente Males which showed a large nearly obstructing mass in the lower third of the esophagus 35 cm from incisors.  This was biopsied and was consistent with adenocarcinoma.  Presently patient reports dysphagia but is able to eat cereal or soft food.  He has lost about 13 pounds in the last month itself.  Denies any significant pain.  He recently retired after many years of service and is otherwise independent of his ADLs and IADLs.    PET CT scan on 06/26/2020 showed circumferential distal esophageal mass with an SUV of 13.3 extending over 5.5 cm. Right lower paratracheal node is 0.6 cm with an SUV of 3.1. Hypermetabolic right gastric lymph nodes including 1.1 cm node with an SUV of 5.8. Numerous hypermetabolic lesions throughout the liver somewhat confluent around the periphery which were hypermetabolic between 8 and 9. Faintly hypermetabolic left adrenal gland   Interval history: Patient and his daughter are anxious to hear about PET scan results. He did not mention any specific  complaints to me at this time other than ongoing fatigue and difficulty swallowing   Review of Systems  Constitutional: Positive for malaise/fatigue. Negative for chills, fever and weight loss.  HENT: Negative for congestion, ear discharge and nosebleeds.   Eyes: Negative for blurred vision.  Respiratory: Negative for cough, hemoptysis, sputum production, shortness of breath and wheezing.   Cardiovascular: Negative for chest pain, palpitations, orthopnea and claudication.  Gastrointestinal: Negative for abdominal pain, blood in stool, constipation, diarrhea, heartburn, melena, nausea and vomiting.       Difficulty swallowing  Genitourinary: Negative for dysuria, flank pain, frequency, hematuria and urgency.  Musculoskeletal: Negative for back pain, joint pain and myalgias.  Skin: Negative for rash.  Neurological: Negative for dizziness, tingling, focal weakness, seizures, weakness and headaches.  Endo/Heme/Allergies: Does not bruise/bleed easily.  Psychiatric/Behavioral: Negative for depression and suicidal ideas. The patient does not have insomnia.     No Known Allergies  Past Medical History:  Diagnosis Date  . Esophageal adenocarcinoma ([place]) 06/23/2020  . GERD (gastroesophageal reflux disease)   . History of kidney stones   . Nephrolithiasis    [facility]  . Pancreatitis, acute   . Personal history of colonic polyps    Dr Nicolasa Ducking  . Psoriasis     Past Surgical History:  Procedure Laterality Date  . COLONOSCOPY    . COLONOSCOPY WITH PROPOFOL N/A 03/31/2020   Procedure: COLONOSCOPY WITH PROPOFOL;  Surgeon: Jonathon Bellows, MD;  Location: [facility];  Service: Gastroenterology;  Laterality: N/A;  . ERCP    . ESOPHAGOGASTRODUODENOSCOPY (EGD) WITH PROPOFOL N/A 06/19/2020   Procedure: ESOPHAGOGASTRODUODENOSCOPY (EGD) WITH PROPOFOL;  Surgeon: Vicente Males,  Bailey Mech, MD;  Location: [facility];  Service: Gastroenterology;  Laterality: N/A;  . groin surgery     as a child  . PORTA CATH  INSERTION N/A 06/28/2020   Procedure: PORTA CATH INSERTION;  Surgeon: Algernon Huxley, MD;  Location: [facility];  Service: Cardiovascular;  Laterality: N/A;  . SHOULDER SURGERY      Social History   Socioeconomic History  . Marital status: Married    Spouse name: Not on file  . Number of children: 2  . Years of education: Not on file  . Highest education level: Not on file  Occupational History  . 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 Smoking status: Former Smoker    Types: Cigarettes    Quit date: 07/08/1988    Years since quitting: 31.9  . Smokeless tobacco: Never Used  Vaping Use  . Vaping Use: Never used  Substance and Sexual Activity  . Alcohol use: No    Comment: recovering alcoholic for 13 years  . Drug use: No  . Sexual activity: Not on file  Other Topics Concern  . Not on file  Social History Narrative   ** Merged History Encounter **       Social Determinants of Health   Financial Resource Strain: Not on file  Food Insecurity: Not on file  Transportation Needs: Not on file  Physical Activity: Not on file  Stress: Not on file  Social Connections: Not on file  Intimate Partner Violence: Not on file    Family History  Problem Relation Age of Onset  . Stomach cancer Mother   . Cancer Mother        stomach cancer  . Diabetes Brother   . Hypertension Brother   . Cancer Father        prostate cancer  . Hypertension Father   . Cancer Sister        colon cancer  . Colon cancer Neg Hx      Current Outpatient Medications:  .  dexamethasone (DECADRON) 4 MG tablet, Take 2 tablets (8 mg total) by mouth daily. Start the day after chemotherapy for 2 days. Take with food. (Patient not taking: Reported on 06/28/2020), Disp: 30 tablet, Rfl: 1 .  lidocaine-prilocaine (EMLA) cream, Apply to affected area once, Disp: 30 g, Rfl: 3 .  LORazepam (ATIVAN) 0.5 MG tablet, Take 1 tablet (0.5 mg total) by mouth every 6 (six)  hours as needed (Nausea or vomiting)., Disp: 30 tablet, Rfl: 0 .  ondansetron (ZOFRAN) 8 MG tablet, Take 1 tablet (8 mg total) by mouth 2 (two) times daily as needed for refractory nausea / vomiting. Start on day 3 after chemotherapy., Disp: 30 tablet, Rfl: 1 .  pantoprazole (PROTONIX) 40 MG tablet, Take 1 tablet (40 mg total) by mouth 2 (two) times daily before a meal., Disp: 60 tablet, Rfl: 3 .  prochlorperazine (COMPAZINE) 10 MG tablet, Take 1 tablet (10 mg total) by mouth every 6 (six) hours as needed (Nausea or vomiting)., Disp: 30 tablet, Rfl: 1 .  TREMFYA 100 MG/ML SOSY, Inject 1 Dose into the skin every 8 (eight) weeks. , Disp: , Rfl:   PERIPHERAL VASCULAR CATHETERIZATION  Result Date: 06/28/2020 See op note  NM PET Image Initial (PI) Skull Base To Thigh  Result Date: 06/26/2020 CLINICAL DATA:  Initial treatment strategy for esophageal adenocarcinoma. EXAM: NUCLEAR MEDICINE PET SKULL BASE TO THIGH TECHNIQUE: 10.8 mCi F-18 FDG was injected  intravenously. Full-ring PET imaging was performed from the skull base to thigh after the radiotracer. CT data was obtained and used for attenuation correction and anatomic localization. Fasting blood glucose: 68 mg/dl COMPARISON:  CT abdomen 03/25/2012 FINDINGS: Mediastinal blood pool activity: SUV max 2.1 Liver activity: SUV max NA NECK: No significant abnormal hypermetabolic activity in this region. Incidental CT findings: none CHEST: Circumferential distal esophageal mass has a maximum SUV of 13.3 and extends over a 5.5 cm length. Right lower paratracheal lymph node 0.6 cm in short axis on image 68 series 4, maximum SUV 3.1. Incidental CT findings: none ABDOMEN/PELVIS: Hypermetabolic right gastric lymph nodes are present including a 1.1 cm in short axis node on image 114 of series 4 with maximum SUV 5.8. 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 Index lesion anteriorly in the lateral segment left hepatic lobe about at the level of image 117 of series 4 has a maximum SUV of 8.0. A posterior right hepatic lobe lesion about at the level of image 111 has a maximum SUV of 9.0. Faintly hypermetabolic left adrenal gland without a discretely visualized mass, maximum SUV 4.1. Probably incidental but merits surveillance. Accentuated metabolic activity along the small bowel adjacent to the cecum, maximum SUV 11.8, no definite CT abnormality, questionable physiologic activity. Incidental CT findings: Aortoiliac atherosclerotic vascular disease. Nonobstructive bilateral nephrolithiasis. SKELETON: No significant abnormal hypermetabolic activity in this region. Incidental CT findings: Bridging spurring of the sacroiliac joints, left greater than right. Mild levoconvex lumbar scoliosis with rotary component. Degenerative arthropathy of both glenohumeral joints. IMPRESSION: 1. Hypermetabolic distal esophageal mass, maximum SUV 13.3, about 5.5 cm in length. 2. Hypermetabolic right gastric lymph nodes with numerous somewhat confluent peripheral hypermetabolic lesions scattered throughout the liver, causing a pseudo cirrhosis appearance. 3. Faintly accentuated metabolic activity in the left adrenal gland without a discrete visualized mass, probably incidental. 4. Bilateral nonobstructive nephrolithiasis. 5.  Aortic Atherosclerosis (ICD10-I70.0). Electronically Signed   By: Van Clines M.D.   On: 06/26/2020 15:00        CMP Latest Ref Rng & Units 06/22/2020  Glucose 70 - 99 mg/dL 135(H)  BUN 8 - 23 mg/dL 13  Creatinine 0.61 - 1.24 mg/dL 0.87  Sodium 135 - 145 mmol/L 134(L)  Potassium 3.5 - 5.1 mmol/L 4.0  Chloride 98 - 111 mmol/L 100  CO2 22 - 32 mmol/L 25  Calcium 8.9 - 10.3 mg/dL 8.9  Total Protein 6.5 - 8.1 g/dL 8.1  Total Bilirubin 0.3 - 1.2 mg/dL 0.8  Alkaline Phos 38 - 126 U/L 89  AST  15 - 41 U/L 27  ALT 0 - 44 U/L 19   CBC Latest Ref Rng & Units 06/22/2020  WBC 4.0 - 10.5 K/uL 8.7  Hemoglobin 13.0 - 17.0 g/dL 12.6(L)  Hematocrit 39.0 - 52.0 % 38.6(L)  Platelets 150 - 400 K/uL 350     Observation/objective: Appears in no acute distress over video visit today. 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 Have also showed PET scan images to the patient.  Unfortunately patient has evidence of multiple areas of liver metastases in addition to distal esophageal mass and local regional adenopathy making this a stage  IV disease.  He is therefore not a surgical candidate at this time.  I would recommend proceeding with systemic chemotherapy at this time with FOLFOX given IV every 2 weeks until progression or toxicity.  Discussed risks and benefits of 5-FU and oxaliplatin including all but not limited to nausea, vomiting, low blood counts, risk of infections and hospitalizations.  Risk of peripheral neuropathy associated with oxaliplatin.  Treatment will be given with a palliative intent.  Plan is to proceed with cycle 1 starting next week.  I am also awaiting NGS studies and CPS score on his tumor specimen.  His HER-2 testing was positive in tumor specimen and I will discuss with him at his next visit about adding Herceptin along with FOLFOX.  Results of toga trial showed improvement in progression free survival with the addition of Herceptin to FOLFOX and HER-2 positive adenocarcinoma.  Also depending on his CPS score if it is greater than or equal to 5 I will consider adding Opdivo or Keytruda to his treatment regimen as well.  Patient is also meeting with Dr. Donella Stade from radiation oncology given that he has significant obstruction from his lower esophageal  mass although he is able to still take some liquid foods. Palliative radiation will hopefully help in improving the degree of obstruction and allowing him to eat better.  Patient understands that if he continues to lose weight and unable to keep up with his nutritional intake;  placement of PEG tube remains an option down the line  Patient does not have any significant pain at this time.  He is getting port placement today and chemo teach tomorrow to start treatment on 07/03/2020 as planned.  Follow-up instructions: As above  I discussed the assessment and treatment plan with the patient. The patient was provided an opportunity to ask questions and all were answered. The patient agreed with the plan and demonstrated an understanding of the instructions.   The patient was advised to call back or seek an in-person evaluation if the symptoms worsen or if the condition fails to improve as anticipated.  Cancer Staging Esophageal adenocarcinoma [facility]) Staging form: Esophagus - Adenocarcinoma, AJCC 8th Edition - Clinical stage from 06/28/2020: Stage IVB (cTX, cN1, cM1) - Signed by [name], MD on 06/28/2020    Visit Diagnosis: 1. Goals of care, counseling/discussion   2. Esophageal adenocarcinoma ([place])     Dr. Randa Evens, MD, MPH [facility] at [facility] Tel- [phone] 06/29/2020 8:26 AM

## 2020-07-03 ENCOUNTER — Inpatient Hospital Stay: Payer: Medicare Other

## 2020-07-03 ENCOUNTER — Encounter: Payer: Self-pay | Admitting: Radiation Oncology

## 2020-07-03 ENCOUNTER — Telehealth: Payer: PRIVATE HEALTH INSURANCE | Admitting: Oncology

## 2020-07-03 ENCOUNTER — Other Ambulatory Visit: Payer: Self-pay | Admitting: *Deleted

## 2020-07-03 ENCOUNTER — Inpatient Hospital Stay (HOSPITAL_BASED_OUTPATIENT_CLINIC_OR_DEPARTMENT_OTHER): Payer: Medicare Other | Admitting: Oncology

## 2020-07-03 ENCOUNTER — Other Ambulatory Visit: Payer: Self-pay

## 2020-07-03 ENCOUNTER — Telehealth: Payer: Self-pay | Admitting: *Deleted

## 2020-07-03 ENCOUNTER — Other Ambulatory Visit: Payer: Medicare Other

## 2020-07-03 ENCOUNTER — Encounter: Payer: Self-pay | Admitting: Oncology

## 2020-07-03 ENCOUNTER — Ambulatory Visit
Admission: RE | Admit: 2020-07-03 | Discharge: 2020-07-03 | Disposition: A | Discharge status: Home / Self Care | Payer: Medicare Other | Source: Ambulatory Visit | Attending: Radiation Oncology | Admitting: Radiation Oncology

## 2020-07-03 VITALS — BP 150/71 | HR 75 | Temp 98.0°F | Resp 18 | Wt 192.9 lb

## 2020-07-03 VITALS — BP 145/69 | HR 76 | Temp 97.9°F | Wt 193.0 lb

## 2020-07-03 DIAGNOSIS — C169 Malignant neoplasm of stomach, unspecified: Secondary | ICD-10-CM

## 2020-07-03 DIAGNOSIS — G893 Neoplasm related pain (acute) (chronic): Secondary | ICD-10-CM

## 2020-07-03 DIAGNOSIS — C159 Malignant neoplasm of esophagus, unspecified: Secondary | ICD-10-CM

## 2020-07-03 DIAGNOSIS — Z5111 Encounter for antineoplastic chemotherapy: Secondary | ICD-10-CM | POA: Diagnosis not present

## 2020-07-03 DIAGNOSIS — C155 Malignant neoplasm of lower third of esophagus: Secondary | ICD-10-CM

## 2020-07-03 LAB — CBC WITH DIFFERENTIAL/PLATELET
Abs Immature Granulocytes: 0.04 10*3/uL (ref 0.00–0.07)
Basophils Absolute: 0 10*3/uL (ref 0.0–0.1)
Basophils Relative: 0 %
Eosinophils Absolute: 0.1 10*3/uL (ref 0.0–0.5)
Eosinophils Relative: 1 %
HCT: 33 % — ABNORMAL LOW (ref 39.0–52.0)
Hemoglobin: 10.7 g/dL — ABNORMAL LOW (ref 13.0–17.0)
Immature Granulocytes: 0 %
Lymphocytes Relative: 14 %
Lymphs Abs: 1.5 10*3/uL (ref 0.7–4.0)
MCH: 27.4 pg (ref 26.0–34.0)
MCHC: 32.4 g/dL (ref 30.0–36.0)
MCV: 84.4 fL (ref 80.0–100.0)
Monocytes Absolute: 0.9 10*3/uL (ref 0.1–1.0)
Monocytes Relative: 9 %
Neutro Abs: 8.2 10*3/uL — ABNORMAL HIGH (ref 1.7–7.7)
Neutrophils Relative %: 76 %
Platelets: 478 10*3/uL — ABNORMAL HIGH (ref 150–400)
RBC: 3.91 MIL/uL — ABNORMAL LOW (ref 4.22–5.81)
RDW: 13.2 % (ref 11.5–15.5)
WBC: 10.7 10*3/uL — ABNORMAL HIGH (ref 4.0–10.5)
nRBC: 0 % (ref 0.0–0.2)

## 2020-07-03 LAB — COMPREHENSIVE METABOLIC PANEL
ALT: 42 U/L (ref 0–44)
AST: 48 U/L — ABNORMAL HIGH (ref 15–41)
Albumin: 2.9 g/dL — ABNORMAL LOW (ref 3.5–5.0)
Alkaline Phosphatase: 138 U/L — ABNORMAL HIGH (ref 38–126)
Anion gap: 9 (ref 5–15)
BUN: 18 mg/dL (ref 8–23)
CO2: 25 mmol/L (ref 22–32)
Calcium: 8.6 mg/dL — ABNORMAL LOW (ref 8.9–10.3)
Chloride: 98 mmol/L (ref 98–111)
Creatinine, Ser: 0.84 mg/dL (ref 0.61–1.24)
GFR, Estimated: >60 mL/min (ref >60)
Glucose, Bld: 171 mg/dL — ABNORMAL HIGH (ref 70–99)
Potassium: 4 mmol/L (ref 3.5–5.1)
Sodium: 132 mmol/L — ABNORMAL LOW (ref 135–145)
Total Bilirubin: 1 mg/dL (ref 0.3–1.2)
Total Protein: 7.3 g/dL (ref 6.5–8.1)

## 2020-07-03 MED ORDER — SODIUM CHLORIDE 0.9 % IV SOLN
2358.0000 mg/m2 | INTRAVENOUS | Status: DC
  Administered 2020-07-03: 5000 mg via INTRAVENOUS
  Filled 2020-07-03: qty 100

## 2020-07-03 MED ORDER — OXALIPLATIN CHEMO INJECTION 100 MG/20ML
65.0000 mg/m2 | Freq: Once | INTRAVENOUS | Status: AC
  Administered 2020-07-03: 140 mg via INTRAVENOUS
  Filled 2020-07-03: qty 20

## 2020-07-03 MED ORDER — SODIUM CHLORIDE 0.9% FLUSH
10.0000 mL | INTRAVENOUS | Status: DC | PRN
  Administered 2020-07-03: 10 mL via INTRAVENOUS
  Filled 2020-07-03: qty 10

## 2020-07-03 MED ORDER — LEUCOVORIN CALCIUM INJECTION 350 MG
401.0000 mg/m2 | Freq: Once | INTRAVENOUS | Status: AC
  Administered 2020-07-03: 850 mg via INTRAVENOUS
  Filled 2020-07-03: qty 35

## 2020-07-03 MED ORDER — DEXTROSE 5 % IV SOLN
Freq: Once | INTRAVENOUS | Status: AC
  Filled 2020-07-03: qty 250

## 2020-07-03 MED ORDER — PALONOSETRON HCL INJECTION 0.25 MG/5ML
0.2500 mg | Freq: Once | INTRAVENOUS | Status: AC
  Administered 2020-07-03: 0.25 mg via INTRAVENOUS
  Filled 2020-07-03: qty 5

## 2020-07-03 MED ORDER — FLUOROURACIL CHEMO INJECTION 2.5 GM/50ML
400.0000 mg/m2 | Freq: Once | INTRAVENOUS | Status: AC
Start: 2020-07-03 — End: 2020-07-03
  Administered 2020-07-03: 850 mg via INTRAVENOUS
  Filled 2020-07-03: qty 17

## 2020-07-03 MED ORDER — OXYCODONE HCL 5 MG PO TABS: 5.0000 mg | ORAL_TABLET | Freq: Three times a day (TID) | ORAL | 0 refills | Status: DC | PRN

## 2020-07-03 MED ORDER — SODIUM CHLORIDE 0.9 % IV SOLN
10.0000 mg | Freq: Once | INTRAVENOUS | Status: AC
  Administered 2020-07-03: 10 mg via INTRAVENOUS
  Filled 2020-07-03: qty 10

## 2020-07-03 NOTE — Progress Notes (Signed)
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 Vicente Males which showed a large nearly obstructing mass in the lower third of the esophagus 35 cm from incisors. 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  PD-L1/CPS/NGS pending.   Presently patient reports dysphagia but is able to eat cereal or soft food. He has lost about 13 pounds in the last month itself. Denies any significant pain. He recently retired after many years of service and is otherwise independent of his ADLs and IADLs.   PET CT scan on 06/26/2020 showed circumferential distal esophageal mass with an SUV of 13.3 extending over 5.5 cm. Right lower paratracheal node is 0.6 cm with an SUV of 3.1. Hypermetabolic right gastric lymph nodes including 1.1 cm node with an SUV of 5.8. Numerous hypermetabolic lesions throughout the liver somewhat confluent around the periphery which were hypermetabolic between 8 and 9. Faintly hypermetabolic left adrenal gland   Interval history-he is able to swallow up to 3 ensures a day.  He is also eating soft foods in addition to back.  He does report some pain around the sides of his abdomen especially when he takes a deep breath.  ECOG PS- 1 Pain scale- 4 Opioid associated constipation- no  Review of systems- Review of Systems  Constitutional:  Positive for malaise/fatigue. Negative for chills, fever and weight loss.  HENT: Negative for congestion, ear discharge and nosebleeds.   Eyes: Negative for blurred vision.  Respiratory: Negative for cough, hemoptysis, sputum production, shortness of breath and wheezing.   Cardiovascular: Negative for chest pain, palpitations, orthopnea and claudication.  Gastrointestinal: Positive for abdominal pain. Negative for blood in stool, constipation, diarrhea, heartburn, melena, nausea and vomiting.       Difficulty swallowing  Genitourinary: Negative for dysuria, flank pain, frequency, hematuria and urgency.  Musculoskeletal: Negative for back pain, joint pain and myalgias.  Skin: Negative for rash.  Neurological: Negative for dizziness, tingling, focal weakness, seizures, weakness and headaches.  Endo/Heme/Allergies: Does not bruise/bleed easily.  Psychiatric/Behavioral: Negative for depression and suicidal ideas. The patient does not have insomnia.      No Known Allergies   Past Medical History:  Diagnosis Date  . Esophageal adenocarcinoma ([place]) 06/23/2020  . GERD (gastroesophageal reflux disease)   . History of kidney stones   . Nephrolithiasis    [facility]  . Pancreatitis, acute   . Personal history of colonic polyps    Dr Nicolasa Ducking  . Psoriasis      Past Surgical History:  Procedure Laterality Date  . COLONOSCOPY    . COLONOSCOPY WITH PROPOFOL N/A 03/31/2020   Procedure: COLONOSCOPY WITH PROPOFOL;  Surgeon: Jonathon Bellows, MD;  Location: [facility] ENDOSCOPY;  Service: Gastroenterology;  Laterality: N/A;  . ERCP    . ESOPHAGOGASTRODUODENOSCOPY (EGD) WITH PROPOFOL N/A 06/19/2020   Procedure: ESOPHAGOGASTRODUODENOSCOPY (EGD) WITH PROPOFOL;  Surgeon: Jonathon Bellows, MD;  Location: [facility] ENDOSCOPY;  Service: Gastroenterology;  Laterality: N/A;  .  groin surgery     as a child  . PORTA CATH INSERTION N/A 06/28/2020   Procedure: PORTA CATH INSERTION;  Surgeon: Algernon Huxley, MD;  Location: [facility];  Service: Cardiovascular;  Laterality: N/A;  . SHOULDER SURGERY      Social History   Socioeconomic History  . Marital status: Married    Spouse name: Not on file  . Number of children: 2  . Years of education: Not on file  . Highest education level: Not on file  Occupational History  . 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 Smoking status: Former Smoker    Types: Cigarettes    Quit date: 07/08/1988    Years since quitting: 32.0  . Smokeless tobacco: Never Used  Vaping Use  . Vaping Use: Never used  Substance and Sexual Activity  . Alcohol use: No    Comment: recovering alcoholic for 13 years  . Drug use: No  . Sexual activity: Not on file  Other Topics Concern  . Not on file  Social History Narrative   ** Merged History Encounter **       Social Determinants of Health   Financial Resource Strain: Not on file  Food Insecurity: Not on file  Transportation Needs: Not on file  Physical Activity: Not on file  Stress: Not on file  Social Connections: Not on file  Intimate Partner Violence: Not on file    Family History  Problem Relation Age of Onset  . Stomach cancer Mother   . Cancer Mother        stomach cancer  . Diabetes Brother   . Hypertension Brother   . Cancer Father        prostate cancer  . Hypertension Father   . Cancer Sister        colon cancer  . Colon cancer Neg Hx      Current Outpatient Medications:  .  dexamethasone (DECADRON) 4 MG tablet, Take 2 tablets (8 mg total) by mouth daily. Start the day after chemotherapy for 2 days. Take with food. (Patient not taking: Reported on 06/28/2020), Disp: 30 tablet, Rfl: 1 .  lidocaine-prilocaine (EMLA) cream, Apply 1 application topically as needed. Apply small amount to port site at least 1 hour prior to it being accessed, cover with plastic wrap, Disp: 30 g, Rfl: 3 .  lidocaine-prilocaine (EMLA) cream, Apply 1 application topically as directed.  Apply small amount of cream over port site with each treatment, place saran wrap over cream to protect clothing, Disp: 30 g, Rfl: 0 .  LORazepam (ATIVAN) 0.5 MG tablet, Take 1 tablet (0.5 mg total) by mouth every 6 (six) hours as needed (Nausea or vomiting)., Disp: 30 tablet, Rfl: 0 .  ondansetron (ZOFRAN) 8 MG tablet, Take 1 tablet (8 mg total) by mouth 2 (two) times daily as needed for refractory nausea / vomiting. Start on day 3 after chemotherapy., Disp: 30 tablet, Rfl: 1 .  pantoprazole (PROTONIX) 40 MG tablet, Take 1 tablet (40 mg total) by mouth 2 (two) times daily before a meal., Disp: 60 tablet, Rfl: 3 .  prochlorperazine (COMPAZINE) 10 MG tablet, Take 1 tablet (10 mg total) by mouth every 6 (six) hours as needed (Nausea or vomiting)., Disp: 30 tablet, Rfl: 1 .  TREMFYA 100 MG/ML SOSY, Inject 1 Dose into the skin every 8 (eight) weeks. , Disp: , Rfl:   Physical exam:  Vitals:   07/03/20  1049  BP: (!) 150/71  Pulse: 75  Resp: 18  Temp: 98 F (36.7 C)  TempSrc: Tympanic  SpO2: 100%  Weight: 192 lb 14.4 oz (87.5 kg)   Physical Exam HENT:     Head: Normocephalic and atraumatic.  Eyes:     Extraocular Movements: EOM normal.     Pupils: Pupils are equal, round, and reactive to light.  Cardiovascular:     Rate and Rhythm: Normal rate and regular rhythm.     Heart sounds: Normal heart sounds.  Pulmonary:     Effort: Pulmonary effort is normal.     Breath sounds: Normal breath sounds.  Abdominal:     General: Bowel sounds are normal.     Palpations: Abdomen is soft.  Musculoskeletal:     Cervical back: Normal range of motion.  Skin:    General: Skin is warm and dry.  Neurological:     Mental Status: He is alert and oriented to person, place, and time.      CMP Latest Ref Rng & Units 06/22/2020  Glucose 70 - 99 mg/dL 135(H)  BUN 8 - 23 mg/dL 13  Creatinine 0.61 - 1.24 mg/dL 0.87  Sodium 135 - 145 mmol/L 134(L)  Potassium 3.5 - 5.1 mmol/L 4.0  Chloride 98 - 111 mmol/L  100  CO2 22 - 32 mmol/L 25  Calcium 8.9 - 10.3 mg/dL 8.9  Total Protein 6.5 - 8.1 g/dL 8.1  Total Bilirubin 0.3 - 1.2 mg/dL 0.8  Alkaline Phos 38 - 126 U/L 89  AST 15 - 41 U/L 27  ALT 0 - 44 U/L 19   CBC Latest Ref Rng & Units 06/22/2020  WBC 4.0 - 10.5 K/uL 8.7  Hemoglobin 13.0 - 17.0 g/dL 12.6(L)  Hematocrit 39.0 - 52.0 % 38.6(L)  Platelets 150 - 400 K/uL 350    No images are attached to the encounter.  PERIPHERAL VASCULAR CATHETERIZATION  Result Date: 06/28/2020 See op note  NM PET Image Initial (PI) Skull Base To Thigh  Result Date: 06/26/2020 CLINICAL DATA:  Initial treatment strategy for esophageal adenocarcinoma. EXAM: NUCLEAR MEDICINE PET SKULL BASE TO THIGH TECHNIQUE: 10.8 mCi F-18 FDG was injected intravenously. Full-ring PET imaging was performed from the skull base to thigh after the radiotracer. CT data was obtained and used for attenuation correction and anatomic localization. Fasting blood glucose: 68 mg/dl COMPARISON:  CT abdomen 03/25/2012 FINDINGS: Mediastinal blood pool activity: SUV max 2.1 Liver activity: SUV max NA NECK: No significant abnormal hypermetabolic activity in this region. Incidental CT findings: none CHEST: Circumferential distal esophageal mass has a maximum SUV of 13.3 and extends over a 5.5 cm length. Right lower paratracheal lymph node 0.6 cm in short axis on image 68 series 4, maximum SUV 3.1. Incidental CT findings: none ABDOMEN/PELVIS: Hypermetabolic right gastric lymph nodes are present including a 1.1 cm in short axis node on image 114 of series 4 with maximum SUV 5.8. 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 Index lesion anteriorly in the lateral segment left hepatic lobe about at the level of image 117 of series 4 has a maximum SUV of 8.0. A posterior right hepatic lobe  lesion about at the level of image 111 has a maximum SUV of 9.0. Faintly hypermetabolic left adrenal gland without a discretely visualized mass, maximum SUV 4.1. Probably incidental but merits surveillance. Accentuated metabolic activity along the small  bowel adjacent to the cecum, maximum SUV 11.8, no definite CT abnormality, questionable physiologic activity. Incidental CT findings: Aortoiliac atherosclerotic vascular disease. Nonobstructive bilateral nephrolithiasis. SKELETON: No significant abnormal hypermetabolic activity in this region. Incidental CT findings: Bridging spurring of the sacroiliac joints, left greater than right. Mild levoconvex lumbar scoliosis with rotary component. Degenerative arthropathy of both glenohumeral joints. IMPRESSION: 1. Hypermetabolic distal esophageal mass, maximum SUV 13.3, about 5.5 cm in length. 2. Hypermetabolic right gastric lymph nodes with numerous somewhat confluent peripheral hypermetabolic lesions scattered throughout the liver, causing a pseudo cirrhosis appearance. 3. Faintly accentuated metabolic activity in the left adrenal gland without a discrete visualized mass, probably incidental. 4. Bilateral nonobstructive nephrolithiasis. 5.  Aortic Atherosclerosis (ICD10-I70.0). Electronically Signed   By: Van Clines M.D.   On: 06/26/2020 15:00     Assessment and plan- Patient is a 64 y.o. 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  He is here for on treatment assessment prior to cycle 1 of FOLFOX chemotherapy  Counts okay to proceed with cycle 1 of FOLFOX chemotherapy today.  He will come back on day 3 for pump disconnect.  Again discussed risks and benefits of chemotherapy including all but not limited to nausea, vomiting, low blood counts, risk of infections and hospitalizations.  Risk of peripheral neuropathy associated with oxaliplatin.  Treatment will be given with a palliative intent.  Patient  understands and agrees to proceed as planned.  His tumor was positive for HER-2 and he would therefore benefit with addition of trastuzumab which I will plan to do with cycle 2.  He will need a baseline echocardiogram prior to starting that given risk of cardiotoxicity associated with it.  Patient understands and agrees to proceed as planned.  We will plan to hold off on adding Herceptin while he is receiving radiation treatment  We are still waiting on NGS/CPS testing to determine if he would benefit from additional immunotherapy.  Patient will be seeing radiation oncology to discuss palliative radiation to his lower GE junction mass today and is likely to start treatment next week.  Neoplasm related pain: I will start him on oxycodone 5 mg every 8 hours as needed.  Encourage the use of bowel medications to avoid constipation associated with opioids.   Visit Diagnosis 1. Esophageal adenocarcinoma ([place])   2. Encounter for antineoplastic chemotherapy   3. 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 Neoplasm related pain      Dr. Randa Evens, MD, MPH [facility] at [facility] [phone] 07/03/2020 3:16 PM

## 2020-07-03 NOTE — Telephone Encounter (Signed)
Daughter called reporting patient unable to get pain medications due to needing a prior authorization first. She requests a return call once done

## 2020-07-03 NOTE — Consult Note (Signed)
NEW PATIENT EVALUATION  Name: Cory [name]  MRN: [id number]  Date:   07/03/2020     DOB: 02-13-1956   This 64 y.o. male patient presents to the clinic for initial evaluation of distal esophageal adenocarcinoma causing obstruction and patient with known stage IV disease with widespread liver metastasis.  REFERRING PHYSICIAN: Venia Carbon, MD  CHIEF COMPLAINT:  Chief Complaint  Patient presents with  . Esophageal Cancer    Initial consultation    DIAGNOSIS: There were no encounter diagnoses.   PREVIOUS INVESTIGATIONS:  PET/CT scan reviewed Pathology report reviewed Clinical notes reviewed  HPI: Patient is a 64 year old male who is presented with several month history of increasing dyspepsia and progressive dysphagia.  He underwent EGD on 1214 showing a large nearly obstructing mass in the lower third of esophagus 35 cm from the incisors which was biopsy positive for adenocarcinoma.  He has a soft diet at this time.  He underwent a PET CT scan on 1220 showing hypermetabolic [place] he in the area of the distal esophageal mass with some right lower paratracheal node right gastric lymph node.  Also he had numerous hypermetabolic lesions throughout the liver consistent with widespread metastatic disease.  He also had a faintly hypermetabolic left adrenal gland.  He has been seen by medical oncology and plans for systemic chemotherapy was recommended.  He is now referred to radiation collagen for consideration of palliative radiation based on the near completely obstructing mass of the distal esophagus.  Patient states with soft food he is able to swallow fairly well at this point.  PLANNED TREATMENT REGIMEN: Palliative radiation therapy to distal esophagus  PAST MEDICAL HISTORY:  has a past medical history of Esophageal adenocarcinoma ([place]) (06/23/2020), GERD (gastroesophageal reflux disease), History of kidney stones, Nephrolithiasis, Pancreatitis, acute, Personal history of colonic  polyps, and Psoriasis.    PAST SURGICAL HISTORY:  Past Surgical History:  Procedure Laterality Date  . COLONOSCOPY    . COLONOSCOPY WITH PROPOFOL N/A 03/31/2020   Procedure: COLONOSCOPY WITH PROPOFOL;  Surgeon: Jonathon Bellows, MD;  Location: [facility] ENDOSCOPY;  Service: Gastroenterology;  Laterality: N/A;  . ERCP    . ESOPHAGOGASTRODUODENOSCOPY (EGD) WITH PROPOFOL N/A 06/19/2020   Procedure: ESOPHAGOGASTRODUODENOSCOPY (EGD) WITH PROPOFOL;  Surgeon: Jonathon Bellows, MD;  Location: [facility] ENDOSCOPY;  Service: Gastroenterology;  Laterality: N/A;  . groin surgery     as a child  . PORTA CATH INSERTION N/A 06/28/2020   Procedure: PORTA CATH INSERTION;  Surgeon: Algernon Huxley, MD;  Location: [place] CV LAB;  Service: Cardiovascular;  Laterality: N/A;  . SHOULDER SURGERY      FAMILY HISTORY: family history includes Cancer in his father, mother, and sister; Diabetes in his brother; Hypertension in his brother and father; Stomach cancer in his mother.  SOCIAL HISTORY:  reports that he quit smoking about 32 years ago. His smoking use included cigarettes. He has never used smokeless tobacco. He reports that he does not drink alcohol and does not use drugs.  ALLERGIES: Patient has no known allergies.  MEDICATIONS:  Current Outpatient Medications  Medication Sig Dispense Refill  . lidocaine-prilocaine (EMLA) cream Apply 1 application topically as needed. Apply small amount to port site at least 1 hour prior to it being accessed, cover with plastic wrap 30 g 3  . lidocaine-prilocaine (EMLA) cream Apply 1 application topically as directed. Apply small amount of cream over port site with each treatment, place saran wrap over cream to protect clothing 30 g 0  . pantoprazole (PROTONIX)  40 MG tablet Take 1 tablet (40 mg total) by mouth 2 (two) times daily before a meal. 60 tablet 3  . dexamethasone (DECADRON) 4 MG tablet Take 2 tablets (8 mg total) by mouth daily. Start the day after chemotherapy for 2 days. Take  with food. (Patient not taking: Reported on 06/28/2020) 30 tablet 1  . LORazepam (ATIVAN) 0.5 MG tablet Take 1 tablet (0.5 mg total) by mouth every 6 (six) hours as needed (Nausea or vomiting). (Patient not taking: Reported on 07/03/2020) 30 tablet 0  . ondansetron (ZOFRAN) 8 MG tablet Take 1 tablet (8 mg total) by mouth 2 (two) times daily as needed for refractory nausea / vomiting. Start on day 3 after chemotherapy. (Patient not taking: Reported on 07/03/2020) 30 tablet 1  . prochlorperazine (COMPAZINE) 10 MG tablet Take 1 tablet (10 mg total) by mouth every 6 (six) hours as needed (Nausea or vomiting). (Patient not taking: Reported on 07/03/2020) 30 tablet 1  . TREMFYA 100 MG/ML SOSY Inject 1 Dose into the skin every 8 (eight) weeks.  (Patient not taking: Reported on 07/03/2020)     No current facility-administered medications for this encounter.   Facility-Administered Medications Ordered in Other Encounters  Medication Dose Route Frequency Provider Last Rate Last Admin  . fluorouracil (ADRUCIL) 5,000 mg in sodium chloride 0.9 % 150 mL chemo infusion  2,358 mg/m2 (Treatment Plan Recorded) Intravenous 1 day or 1 dose [name], MD      . fluorouracil (ADRUCIL) chemo injection 850 mg  400 mg/m2 (Treatment Plan Recorded) Intravenous Once [name], MD      . leucovorin 850 mg in dextrose 5 % 250 mL infusion  401 mg/m2 (Treatment Plan Recorded) Intravenous Once [name], MD 146 mL/hr at 07/03/20 1239 850 mg at 07/03/20 1239  . oxaliplatin (ELOXATIN) 140 mg in dextrose 5 % 500 mL chemo infusion  65 mg/m2 (Treatment Plan Recorded) Intravenous Once [name], MD 264 mL/hr at 07/03/20 1239 140 mg at 07/03/20 1239  . sodium chloride flush (NS) 0.9 % injection 10 mL  10 mL Intravenous PRN [name], MD   10 mL at 07/03/20 0956    ECOG PERFORMANCE STATUS:  1 - Symptomatic but completely ambulatory  REVIEW OF SYSTEMS: Patient denies any weight loss, fatigue, weakness, fever,  chills or night sweats. Patient denies any loss of vision, blurred vision. Patient denies any ringing  of the ears or hearing loss. No irregular heartbeat. Patient denies heart murmur or history of fainting. Patient denies any chest pain or pain radiating to her upper extremities. Patient denies any shortness of breath, difficulty breathing at night, cough or hemoptysis. Patient denies any swelling in the lower legs. Patient denies any nausea vomiting, vomiting of blood, or coffee ground material in the vomitus. Patient denies any stomach pain. Patient states has had normal bowel movements no significant constipation or diarrhea. Patient denies any dysuria, hematuria or significant nocturia. Patient denies any problems walking, swelling in the joints or loss of balance. Patient denies any skin changes, loss of hair or loss of weight. Patient denies any excessive worrying or anxiety or significant depression. Patient denies any problems with insomnia. Patient denies excessive thirst, polyuria, polydipsia. Patient denies any swollen glands, patient denies easy bruising or easy bleeding. Patient denies any recent infections, allergies or URI. Patient "s visual fields have not changed significantly in recent time.   PHYSICAL EXAM: BP (!) 145/69   Pulse 76   Temp 97.9 F (  36.6 C) (Tympanic)   Wt 193 lb (87.5 kg)   BMI 26.92 kg/m  Well-developed well-nourished patient in NAD. HEENT reveals PERLA, EOMI, discs not visualized.  Oral cavity is clear. No oral mucosal lesions are identified. Neck is clear without evidence of cervical or supraclavicular adenopathy. Lungs are clear to A&P. Cardiac examination is essentially unremarkable with regular rate and rhythm without murmur rub or thrill. Abdomen is benign with no organomegaly or masses noted. Motor sensory and DTR levels are equal and symmetric in the upper and lower extremities. Cranial nerves II through XII are grossly intact. Proprioception is intact. No  peripheral adenopathy or edema is identified. No motor or sensory levels are noted. Crude visual fields are within normal range.  LABORATORY DATA: Pathology report reviewed compatible with above-stated findings    RADIOLOGY RESULTS: PET CT scan reviewed again compatible with above-stated findings   IMPRESSION: Stage IV distal esophageal adenocarcinoma with obstructing mass in 64 year old male  PLAN: At this time elect go ahead with a short hypofractionated course of radiation therapy to his distal esophagus to try to improve his dysphagia.  We will plan on delivering 40 Gray over 4 weeks.  Risks and benefits of treatment including possible worsening dysphagia temporarily skin reaction fatigue alteration of blood counts all were described in detail to the patient.  He seems to comprehend my treatment plan well.  I have personally set up and ordered CT simulation to try to achieve treatment as quickly as possible.  We will use PET CT fusion study for CT treatment planning.  Patient comprehends my recommendations well.  I would like to take this opportunity to thank you for allowing me to participate in the care of your patient.Noreene Filbert, MD

## 2020-07-03 NOTE — Progress Notes (Signed)
Chemo and home infusion pump education performed. Pt verbalizes understanding. Pt tolerated infusion well, no s/s of distress or reaction noted. Pt stable at discharge.

## 2020-07-04 ENCOUNTER — Telehealth: Payer: Self-pay

## 2020-07-04 LAB — HEPATITIS B SURFACE ANTIBODY, QUANTITATIVE: Hep B S AB Quant (Post): 8.6 m[IU]/mL — ABNORMAL LOW (ref >9.9)

## 2020-07-04 LAB — HEPATITIS B SURFACE ANTIGEN: Hepatitis B Surface Ag: NEGATIVE — AB

## 2020-07-04 LAB — HEPATITIS B CORE ANTIBODY, TOTAL: Hep B Core Total Ab: NEGATIVE — AB

## 2020-07-04 NOTE — Telephone Encounter (Signed)
Daughter notified that prescription has been approved and she can pick it up today

## 2020-07-04 NOTE — Telephone Encounter (Signed)
Telephone call to patient for follow up after receiving first infusion.   Patient states infusion went great.  States eating good and drinking plenty of fluids.   Denies any nausea or vomiting.  Encouraged patient to call for any concerns or questions. 

## 2020-07-05 ENCOUNTER — Ambulatory Visit
Admission: RE | Admit: 2020-07-05 | Discharge: 2020-07-05 | Disposition: A | Discharge status: Home / Self Care | Payer: Medicare Other | Source: Ambulatory Visit | Attending: Nurse Practitioner | Admitting: Nurse Practitioner

## 2020-07-05 ENCOUNTER — Inpatient Hospital Stay (HOSPITAL_BASED_OUTPATIENT_CLINIC_OR_DEPARTMENT_OTHER): Payer: Medicare Other | Admitting: Nurse Practitioner

## 2020-07-05 ENCOUNTER — Inpatient Hospital Stay: Payer: Medicare Other

## 2020-07-05 ENCOUNTER — Encounter: Payer: Self-pay | Admitting: Nurse Practitioner

## 2020-07-05 ENCOUNTER — Ambulatory Visit: Payer: Medicare Other

## 2020-07-05 ENCOUNTER — Telehealth: Payer: Self-pay | Admitting: *Deleted

## 2020-07-05 ENCOUNTER — Other Ambulatory Visit: Payer: Self-pay | Admitting: *Deleted

## 2020-07-05 VITALS — BP 122/64 | HR 71 | Temp 98.8°F | Resp 20 | Wt 194.0 lb

## 2020-07-05 VITALS — Temp 98.8°F

## 2020-07-05 DIAGNOSIS — R509 Fever, unspecified: Secondary | ICD-10-CM

## 2020-07-05 DIAGNOSIS — C159 Malignant neoplasm of esophagus, unspecified: Secondary | ICD-10-CM

## 2020-07-05 DIAGNOSIS — Z5111 Encounter for antineoplastic chemotherapy: Secondary | ICD-10-CM | POA: Diagnosis not present

## 2020-07-05 LAB — COMPREHENSIVE METABOLIC PANEL
ALT: 50 U/L — ABNORMAL HIGH (ref 0–44)
AST: 77 U/L — ABNORMAL HIGH (ref 15–41)
Albumin: 3 g/dL — ABNORMAL LOW (ref 3.5–5.0)
Alkaline Phosphatase: 139 U/L — ABNORMAL HIGH (ref 38–126)
Anion gap: 7 (ref 5–15)
BUN: 27 mg/dL — ABNORMAL HIGH (ref 8–23)
CO2: 27 mmol/L (ref 22–32)
Calcium: 8.3 mg/dL — ABNORMAL LOW (ref 8.9–10.3)
Chloride: 100 mmol/L (ref 98–111)
Creatinine, Ser: 0.95 mg/dL (ref 0.61–1.24)
GFR, Estimated: >60 mL/min (ref >60)
Glucose, Bld: 201 mg/dL — ABNORMAL HIGH (ref 70–99)
Potassium: 4.5 mmol/L (ref 3.5–5.1)
Sodium: 134 mmol/L — ABNORMAL LOW (ref 135–145)
Total Bilirubin: 0.7 mg/dL (ref 0.3–1.2)
Total Protein: 7.1 g/dL (ref 6.5–8.1)

## 2020-07-05 LAB — CBC WITH DIFFERENTIAL/PLATELET
Abs Immature Granulocytes: 0.13 10*3/uL — ABNORMAL HIGH (ref 0.00–0.07)
Basophils Absolute: 0 10*3/uL (ref 0.0–0.1)
Basophils Relative: 0 %
Eosinophils Absolute: 0 10*3/uL (ref 0.0–0.5)
Eosinophils Relative: 0 %
HCT: 33.4 % — ABNORMAL LOW (ref 39.0–52.0)
Hemoglobin: 10.7 g/dL — ABNORMAL LOW (ref 13.0–17.0)
Immature Granulocytes: 1 %
Lymphocytes Relative: 5 %
Lymphs Abs: 0.8 10*3/uL (ref 0.7–4.0)
MCH: 26.8 pg (ref 26.0–34.0)
MCHC: 32 g/dL (ref 30.0–36.0)
MCV: 83.7 fL (ref 80.0–100.0)
Monocytes Absolute: 0.7 10*3/uL (ref 0.1–1.0)
Monocytes Relative: 5 %
Neutro Abs: 13.1 10*3/uL — ABNORMAL HIGH (ref 1.7–7.7)
Neutrophils Relative %: 89 %
Platelets: 469 10*3/uL — ABNORMAL HIGH (ref 150–400)
RBC: 3.99 MIL/uL — ABNORMAL LOW (ref 4.22–5.81)
RDW: 13.4 % (ref 11.5–15.5)
WBC: 14.7 10*3/uL — ABNORMAL HIGH (ref 4.0–10.5)
nRBC: 0 % (ref 0.0–0.2)

## 2020-07-05 LAB — URINALYSIS, COMPLETE (UACMP) WITH MICROSCOPIC
Bacteria, UA: NONE SEEN
Bilirubin Urine: NEGATIVE
Glucose, UA: NEGATIVE mg/dL
Ketones, ur: NEGATIVE mg/dL
Leukocytes,Ua: NEGATIVE
Nitrite: NEGATIVE
Protein, ur: NEGATIVE mg/dL
Specific Gravity, Urine: 1.01 (ref 1.005–1.030)
Squamous Epithelial / HPF: NONE SEEN (ref 0–5)
pH: 6 (ref 5.0–8.0)

## 2020-07-05 MED ORDER — HEPARIN SOD (PORK) LOCK FLUSH 100 UNIT/ML IV SOLN
500.0000 [IU] | Freq: Once | INTRAVENOUS | Status: AC | PRN
  Administered 2020-07-05: 500 [IU]
  Filled 2020-07-05: qty 5

## 2020-07-05 MED ORDER — SODIUM CHLORIDE 0.9% FLUSH
10.0000 mL | INTRAVENOUS | Status: DC | PRN
  Administered 2020-07-05: 10 mL
  Filled 2020-07-05: qty 10

## 2020-07-05 MED ORDER — SODIUM CHLORIDE 0.9 % IV SOLN
Freq: Once | INTRAVENOUS | Status: AC
  Filled 2020-07-05: qty 250

## 2020-07-05 NOTE — Telephone Encounter (Signed)
Patient to come in at 1 for labs ans [facility] and IV fluids today

## 2020-07-05 NOTE — Telephone Encounter (Signed)
Daughter called reporting that patient has a fever of 101.5 this morning. He had a low grade fever last night and is asymptomatic. He had his first chemotherapy treatment yesterday. Please advise

## 2020-07-05 NOTE — Progress Notes (Signed)
Symptom Management [name]  [phone] [phone]  Patient Care Team: Venia Carbon, MD as PCP - Evalina Field, RN as Oncology Nurse Navigator   Name of the patient: Cory [name]  HO:[id number]  1955-11-11   Date of visit: 07/05/20  Diagnosis-esophageal adenocarcinoma  Chief complaint/ Reason for visit-fever  Heme/Onc history:  Oncology History  Esophageal adenocarcinoma ([place])  06/23/2020 Initial Diagnosis   Esophageal adenocarcinoma ([place])   06/28/2020 Cancer Staging   Staging form: Esophagus - Adenocarcinoma, AJCC 8th Edition - Clinical stage from 06/28/2020: Stage IVB (cTX, cN1, cM1) - Signed by [name], MD on 06/28/2020   07/03/2020 -  Chemotherapy   The patient had dexamethasone (DECADRON) 4 MG tablet, 8 mg, Oral, Daily, 1 of 1 cycle, Start date: 06/28/2020, End date: -- palonosetron (ALOXI) injection 0.25 mg, 0.25 mg, Intravenous,  Once, 1 of 6 cycles Administration: 0.25 mg (07/03/2020) leucovorin 850 mg in dextrose 5 % 250 mL infusion, 401 mg/m2 = 848 mg, Intravenous,  Once, 1 of 6 cycles Administration: 850 mg (07/03/2020) oxaliplatin (ELOXATIN) 140 mg in dextrose 5 % 500 mL chemo infusion, 65 mg/m2 = 140 mg (100 % of original dose 65 mg/m2), Intravenous,  Once, 1 of 6 cycles Dose modification: 65 mg/m2 (original dose 65 mg/m2, Cycle 1, Reason: Other (see comments)) Administration: 140 mg (07/03/2020) fluorouracil (ADRUCIL) chemo injection 850 mg, 400 mg/m2 = 850 mg, Intravenous,  Once, 1 of 6 cycles Administration: 850 mg (07/03/2020) fluorouracil (ADRUCIL) 5,000 mg in sodium chloride 0.9 % 150 mL chemo infusion, 2,358 mg/m2 = 5,100 mg, Intravenous, 1 Day/Dose, 1 of 6 cycles Administration: 5,000 mg (07/03/2020)  for chemotherapy treatment.      Interval history-Cory [name], 64 year old male diagnosed with esophageal adenocarcinoma presents to symptom management clinic for concerns of  fever.  Overnight he noticed low-grade fever and this morning temperature was 101.5. No anti-pyretics. Fever has since resolved. He has had some urinary discomfort but otherwise feels at baseline. No cough, congestion, malaise.   Review of systems- Review of Systems  Constitutional: Positive for fever. Negative for chills, malaise/fatigue and weight loss.  HENT: Negative for hearing loss, nosebleeds, sore throat and tinnitus.   Eyes: Negative for blurred vision and double vision.  Respiratory: Negative for cough, hemoptysis, shortness of breath and wheezing.   Cardiovascular: Negative for chest pain, palpitations and leg swelling.  Gastrointestinal: Negative for abdominal pain, blood in stool, constipation, diarrhea, melena, nausea and vomiting.  Genitourinary: Positive for dysuria. Negative for flank pain, hematuria and urgency.  Musculoskeletal: Negative for back pain, falls, joint pain and myalgias.  Skin: Negative for itching and rash.  Neurological: Negative for dizziness, tingling, sensory change, loss of consciousness, weakness and headaches.  Endo/Heme/Allergies: Negative for environmental allergies. Does not bruise/bleed easily.  Psychiatric/Behavioral: Negative for depression. The patient is not nervous/anxious and does not have insomnia.      No Known Allergies  Past Medical History:  Diagnosis Date  . Esophageal adenocarcinoma ([place]) 06/23/2020  . GERD (gastroesophageal reflux disease)   . History of kidney stones   . Nephrolithiasis    [facility]  . Pancreatitis, acute   . Personal history of colonic polyps    Dr Nicolasa Ducking  . Psoriasis     Past Surgical History:  Procedure Laterality Date  . COLONOSCOPY    . COLONOSCOPY WITH PROPOFOL N/A 03/31/2020   Procedure: COLONOSCOPY WITH PROPOFOL;  Surgeon: Jonathon Bellows, MD;  Location: [facility] ENDOSCOPY;  Service: Gastroenterology;  Laterality:  N/A;  . ERCP    . ESOPHAGOGASTRODUODENOSCOPY (EGD) WITH PROPOFOL N/A 06/19/2020   Procedure:  ESOPHAGOGASTRODUODENOSCOPY (EGD) WITH PROPOFOL;  Surgeon: Wyline Mood, MD;  Location[facility] ENDOSCOPY;  Service: Gastroenterology;  Laterality: N/A;  . groin surgery     as a child  . PORTA CATH INSERTION N/A 06/28/2020   Procedure: PORTA CATH INSERTION;  Surgeon: Annice Needy, MD;  Location: [facility] INVASIVE CV LAB;  Service: Cardiovascular;  Laterality: N/A;  . SHOULDER SURGERY      Social History   Socioeconomic History  . Marital status: Married    Spouse name: Not on file  . Number of children: 2  . Years of education: Not on file  . Highest education level: Not on file  Occupational History  . 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 Smoking status: Former Smoker    Types: Cigarettes    Quit date: 07/08/1988    Years since quitting: 32.0  . Smokeless tobacco: Never Used  Vaping Use  . Vaping Use: Never used  Substance and Sexual Activity  . Alcohol use: No    Comment: recovering alcoholic for 13 years  . Drug use: No  . Sexual activity: Not on file  Other Topics Concern  . Not on file  Social History Narrative   ** Merged History Encounter **       Social Determinants of Health   Financial Resource Strain: Not on file  Food Insecurity: Not on file  Transportation Needs: Not on file  Physical Activity: Not on file  Stress: Not on file  Social Connections: Not on file  Intimate Partner Violence: Not on file   Immunization History  Administered Date(s) Administered  . Influenza, Seasonal, Injecte, Preservative Fre 03/16/2019  . Influenza-Unspecified 04/02/2018  . Td 07/08/2005  . Tdap 01/13/2017     Family History  Problem Relation Age of Onset  . Stomach cancer Mother   . Cancer Mother        stomach cancer  . Diabetes Brother   . Hypertension Brother   . Cancer Father        prostate cancer  . Hypertension Father   . Cancer Sister        colon cancer  . Colon cancer Neg Hx      Current Outpatient  Medications:  .  dexamethasone (DECADRON) 4 MG tablet, Take 2 tablets (8 mg total) by mouth daily. Start the day after chemotherapy for 2 days. Take with food., Disp: 30 tablet, Rfl: 1 .  lidocaine-prilocaine (EMLA) cream, Apply 1 application topically as needed. Apply small amount to port site at least 1 hour prior to it being accessed, cover with plastic wrap, Disp: 30 g, Rfl: 3 .  pantoprazole (PROTONIX) 40 MG tablet, Take 1 tablet (40 mg total) by mouth 2 (two) times daily before a meal., Disp: 60 tablet, Rfl: 3 .  LORazepam (ATIVAN) 0.5 MG tablet, Take 1 tablet (0.5 mg total) by mouth every 6 (six) hours as needed (Nausea or vomiting). (Patient not taking: No sig reported), Disp: 30 tablet, Rfl: 0 .  ondansetron (ZOFRAN) 8 MG tablet, Take 1 tablet (8 mg total) by mouth 2 (two) times daily as needed for refractory nausea / vomiting. Start on day 3 after chemotherapy. (Patient not taking: No sig reported), Disp: 30 tablet, Rfl: 1 .  oxyCODONE (OXY IR/ROXICODONE) 5 MG immediate release tablet, Take 1 tablet (5 mg total) by mouth every 8 (  eight) hours as needed for severe pain. (Patient not taking: Reported on 07/05/2020), Disp: 60 tablet, Rfl: 0 .  prochlorperazine (COMPAZINE) 10 MG tablet, Take 1 tablet (10 mg total) by mouth every 6 (six) hours as needed (Nausea or vomiting). (Patient not taking: No sig reported), Disp: 30 tablet, Rfl: 1 .  TREMFYA 100 MG/ML SOSY, Inject 1 Dose into the skin every 8 (eight) weeks.  (Patient not taking: No sig reported), Disp: , Rfl:   Physical exam:  Vitals:   07/05/20 1307  BP: 122/64  Pulse: 71  Resp: 20  Temp: 98.8 F (37.1 C)  TempSrc: Tympanic  Weight: 194 lb (88 kg)   Physical Exam Vitals and nursing note reviewed.  Constitutional:      General: He is not in acute distress. HENT:     Head: Normocephalic.  Cardiovascular:     Rate and Rhythm: Normal rate and regular rhythm.     Heart sounds: Normal heart sounds.  Pulmonary:     Effort:  Pulmonary effort is normal.     Breath sounds: Normal breath sounds. No wheezing or rales.  Abdominal:     General: There is no distension.     Tenderness: There is no abdominal tenderness. There is no guarding.  Musculoskeletal:        General: No deformity.  Skin:    General: Skin is warm.     Findings: No lesion.     Comments: sweating  Neurological:     Mental Status: He is alert and oriented to person, place, and time.     Motor: No weakness.  Psychiatric:        Mood and Affect: Mood normal.        Behavior: Behavior normal.      CMP Latest Ref Rng & Units 07/05/2020  Glucose 70 - 99 mg/dL 201(H)  BUN 8 - 23 mg/dL 27(H)  Creatinine 0.61 - 1.24 mg/dL 0.95  Sodium 135 - 145 mmol/L 134(L)  Potassium 3.5 - 5.1 mmol/L 4.5  Chloride 98 - 111 mmol/L 100  CO2 22 - 32 mmol/L 27  Calcium 8.9 - 10.3 mg/dL 8.3(L)  Total Protein 6.5 - 8.1 g/dL 7.1  Total Bilirubin 0.3 - 1.2 mg/dL 0.7  Alkaline Phos 38 - 126 U/L 139(H)  AST 15 - 41 U/L 77(H)  ALT 0 - 44 U/L 50(H)   CBC Latest Ref Rng & Units 07/05/2020  WBC 4.0 - 10.5 K/uL 14.7(H)  Hemoglobin 13.0 - 17.0 g/dL 10.7(L)  Hematocrit 39.0 - 52.0 % 33.4(L)  Platelets 150 - 400 K/uL 469(H)    No images are attached to the encounter.  PERIPHERAL VASCULAR CATHETERIZATION  Result Date: 06/28/2020 See op note  NM PET Image Initial (PI) Skull Base To Thigh  Result Date: 06/26/2020 CLINICAL DATA:  Initial treatment strategy for esophageal adenocarcinoma. EXAM: NUCLEAR MEDICINE PET SKULL BASE TO THIGH TECHNIQUE: 10.8 mCi F-18 FDG was injected intravenously. Full-ring PET imaging was performed from the skull base to thigh after the radiotracer. CT data was obtained and used for attenuation correction and anatomic localization. Fasting blood glucose: 68 mg/dl COMPARISON:  CT abdomen 03/25/2012 FINDINGS: Mediastinal blood pool activity: SUV max 2.1 Liver activity: SUV max NA NECK: No significant abnormal hypermetabolic activity in this  region. Incidental CT findings: none CHEST: Circumferential distal esophageal mass has a maximum SUV of 13.3 and extends over a 5.5 cm length. Right lower paratracheal lymph node 0.6 cm in short axis on image 68 series 4, maximum SUV  3.1. Incidental CT findings: none ABDOMEN/PELVIS: Hypermetabolic right gastric lymph nodes are present including a 1.1 cm in short axis node on image 114 of series 4 with maximum SUV 5.8. 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 Index lesion anteriorly in the lateral segment left hepatic lobe about at the level of image 117 of series 4 has a maximum SUV of 8.0. A posterior right hepatic lobe lesion about at the level of image 111 has a maximum SUV of 9.0. Faintly hypermetabolic left adrenal gland without a discretely visualized mass, maximum SUV 4.1. Probably incidental but merits surveillance. Accentuated metabolic activity along the small bowel adjacent to the cecum, maximum SUV 11.8, no definite CT abnormality, questionable physiologic activity. Incidental CT findings: Aortoiliac atherosclerotic vascular disease. Nonobstructive bilateral nephrolithiasis. SKELETON: No significant abnormal hypermetabolic activity in this region. Incidental CT findings: Bridging spurring of the sacroiliac joints, left greater than right. Mild levoconvex lumbar scoliosis with rotary component. Degenerative arthropathy of both glenohumeral joints. IMPRESSION: 1. Hypermetabolic distal esophageal mass, maximum SUV 13.3, about 5.5 cm in length. 2. Hypermetabolic right gastric lymph nodes with numerous somewhat confluent peripheral hypermetabolic lesions scattered throughout the liver, causing a pseudo cirrhosis appearance. 3. Faintly accentuated metabolic activity in the left adrenal gland without a discrete visualized mass, probably  incidental. 4. Bilateral nonobstructive nephrolithiasis. 5.  Aortic Atherosclerosis (ICD10-I70.0). Electronically Signed   By: Van Clines M.D.   On: 06/26/2020 15:00    Assessment and plan- Patient is a 64 y.o. male diagnosed with esophageal adenocarcinoma currently receiving chemotherapy who presents to symptom management clinic for complaints of fever.   1. Fever- etiology unclear- malignancy vs infection? WBC & ANC elevated & no gcsf. Afebrile in clinic. No obvious infectious etiology. Neutropenia unlikely with folfox. Check urine given urinary symptoms, chest x-ray, and cultures. If fever persists or work-up revealing, treat accordingly with antibiotics. If fever resolves, likely malignancy related, treat with supportive care. IV fluids in clinic today. Encouraged home hydration. Ok to proceed with ct simulation today and pump d/c as planned. Cultures and urine pending at time of dictation.   Follow up based on results. Patient advised to notify the clinic if there is no improvement in symptoms or if symptoms worsen in next 1-4 days.     Visit Diagnosis 1. Fever, unspecified fever cause   2. Esophageal adenocarcinoma ([place])     Patient expressed understanding and was in agreement with this plan. He also understands that He can call clinic at any time with any questions, concerns, or complaints.   Thank you for allowing me to participate in the care of this very pleasant patient.   Beckey Rutter, DNP, AGNP-C [facility] [place]

## 2020-07-05 NOTE — Progress Notes (Signed)
Patient reports temp of 100.9 last night. He reports that his temp was 101.5 this morning but is now normal before he came to the apts.. Patient denies any episodes of chills or nausea/vomiting. He reports mild constipation. He has not taken any otc "stool softeners, but will this evening after today apt."

## 2020-07-05 NOTE — Progress Notes (Signed)
Tolerated IVF's after chemo pump removal. Obtained urine culture. Sending pt to lab for Blood cultures x 2. Instructions given to go to medical mall after leaving cancer center. Report to registration desk tell them you are there for a CXR. Pt understands. CT simulation appt cancelled for this afternoon rescheduled for 9am tomm morning. Pt and son aware of the appt. Afebrile at time of discharge.

## 2020-07-06 ENCOUNTER — Ambulatory Visit
Admission: RE | Admit: 2020-07-06 | Discharge: 2020-07-06 | Disposition: A | Discharge status: Home / Self Care | Payer: Medicare Other | Source: Ambulatory Visit | Attending: Radiation Oncology | Admitting: Radiation Oncology

## 2020-07-06 DIAGNOSIS — C155 Malignant neoplasm of lower third of esophagus: Secondary | ICD-10-CM | POA: Diagnosis not present

## 2020-07-06 DIAGNOSIS — C787 Secondary malignant neoplasm of liver and intrahepatic bile duct: Secondary | ICD-10-CM | POA: Diagnosis not present

## 2020-07-07 LAB — URINE CULTURE: Culture: NO GROWTH

## 2020-07-10 LAB — CULTURE, BLOOD (ROUTINE X 2)
Culture: NO GROWTH
Culture: NO GROWTH
Special Requests: ADEQUATE
Special Requests: ADEQUATE

## 2020-07-10 LAB — SURGICAL PATHOLOGY

## 2020-07-12 DIAGNOSIS — C155 Malignant neoplasm of lower third of esophagus: Secondary | ICD-10-CM | POA: Insufficient documentation

## 2020-07-12 DIAGNOSIS — C787 Secondary malignant neoplasm of liver and intrahepatic bile duct: Secondary | ICD-10-CM | POA: Diagnosis not present

## 2020-07-13 ENCOUNTER — Ambulatory Visit: Admission: RE | Admit: 2020-07-13 | Payer: Medicare Other | Source: Ambulatory Visit

## 2020-07-13 DIAGNOSIS — C155 Malignant neoplasm of lower third of esophagus: Secondary | ICD-10-CM | POA: Diagnosis not present

## 2020-07-17 ENCOUNTER — Ambulatory Visit
Admission: RE | Admit: 2020-07-17 | Discharge: 2020-07-17 | Disposition: A | Discharge status: Home / Self Care | Payer: Medicare Other | Source: Ambulatory Visit | Attending: Radiation Oncology | Admitting: Radiation Oncology

## 2020-07-17 ENCOUNTER — Ambulatory Visit
Admission: RE | Admit: 2020-07-17 | Discharge: 2020-07-17 | Disposition: A | Discharge status: Home / Self Care | Payer: Medicare Other | Source: Ambulatory Visit | Attending: Oncology | Admitting: Oncology

## 2020-07-17 ENCOUNTER — Other Ambulatory Visit: Payer: Self-pay

## 2020-07-17 DIAGNOSIS — C159 Malignant neoplasm of esophagus, unspecified: Secondary | ICD-10-CM | POA: Diagnosis present

## 2020-07-17 DIAGNOSIS — C155 Malignant neoplasm of lower third of esophagus: Secondary | ICD-10-CM | POA: Diagnosis not present

## 2020-07-17 DIAGNOSIS — Z0189 Encounter for other specified special examinations: Secondary | ICD-10-CM

## 2020-07-17 DIAGNOSIS — C169 Malignant neoplasm of stomach, unspecified: Secondary | ICD-10-CM | POA: Insufficient documentation

## 2020-07-17 DIAGNOSIS — I081 Rheumatic disorders of both mitral and tricuspid valves: Secondary | ICD-10-CM | POA: Insufficient documentation

## 2020-07-17 LAB — ECHOCARDIOGRAM COMPLETE
AR max vel: 3.24 cm2
AV Area VTI: 3.91 cm2
AV Area mean vel: 3.36 cm2
AV Mean grad: 3 mmHg
AV Peak grad: 6.6 mmHg
Ao pk vel: 1.28 m/s
Area-P 1/2: 5.31 cm2
S' Lateral: 3.3 cm

## 2020-07-17 NOTE — Progress Notes (Signed)
*  PRELIMINARY RESULTS* Echocardiogram 2D Echocardiogram has been performed.  Cory [name] 07/17/2020, 11:35 AM

## 2020-07-18 ENCOUNTER — Inpatient Hospital Stay (HOSPITAL_BASED_OUTPATIENT_CLINIC_OR_DEPARTMENT_OTHER): Payer: Medicare Other | Admitting: Oncology

## 2020-07-18 ENCOUNTER — Inpatient Hospital Stay: Payer: Medicare Other | Attending: Oncology

## 2020-07-18 ENCOUNTER — Ambulatory Visit
Admission: RE | Admit: 2020-07-18 | Discharge: 2020-07-18 | Disposition: A | Discharge status: Home / Self Care | Payer: Medicare Other | Source: Ambulatory Visit | Attending: Radiation Oncology | Admitting: Radiation Oncology

## 2020-07-18 ENCOUNTER — Encounter: Payer: Self-pay | Admitting: Oncology

## 2020-07-18 ENCOUNTER — Inpatient Hospital Stay: Payer: Medicare Other

## 2020-07-18 VITALS — BP 142/72 | HR 78 | Temp 98.0°F | Resp 16 | Ht 71.0 in | Wt 185.5 lb

## 2020-07-18 DIAGNOSIS — Z833 Family history of diabetes mellitus: Secondary | ICD-10-CM | POA: Insufficient documentation

## 2020-07-18 DIAGNOSIS — R5383 Other fatigue: Secondary | ICD-10-CM | POA: Insufficient documentation

## 2020-07-18 DIAGNOSIS — C159 Malignant neoplasm of esophagus, unspecified: Secondary | ICD-10-CM

## 2020-07-18 DIAGNOSIS — R1011 Right upper quadrant pain: Secondary | ICD-10-CM | POA: Insufficient documentation

## 2020-07-18 DIAGNOSIS — Z5189 Encounter for other specified aftercare: Secondary | ICD-10-CM | POA: Insufficient documentation

## 2020-07-18 DIAGNOSIS — Z8249 Family history of ischemic heart disease and other diseases of the circulatory system: Secondary | ICD-10-CM | POA: Diagnosis not present

## 2020-07-18 DIAGNOSIS — Z7189 Other specified counseling: Secondary | ICD-10-CM | POA: Diagnosis not present

## 2020-07-18 DIAGNOSIS — Z5111 Encounter for antineoplastic chemotherapy: Secondary | ICD-10-CM | POA: Diagnosis not present

## 2020-07-18 DIAGNOSIS — Z5112 Encounter for antineoplastic immunotherapy: Secondary | ICD-10-CM

## 2020-07-18 DIAGNOSIS — Z87891 Personal history of nicotine dependence: Secondary | ICD-10-CM | POA: Diagnosis not present

## 2020-07-18 DIAGNOSIS — R1310 Dysphagia, unspecified: Secondary | ICD-10-CM | POA: Diagnosis not present

## 2020-07-18 DIAGNOSIS — Z79899 Other long term (current) drug therapy: Secondary | ICD-10-CM | POA: Insufficient documentation

## 2020-07-18 DIAGNOSIS — K219 Gastro-esophageal reflux disease without esophagitis: Secondary | ICD-10-CM | POA: Diagnosis not present

## 2020-07-18 DIAGNOSIS — Z800 Family history of malignant neoplasm of digestive organs: Secondary | ICD-10-CM | POA: Diagnosis not present

## 2020-07-18 DIAGNOSIS — C160 Malignant neoplasm of cardia: Secondary | ICD-10-CM | POA: Insufficient documentation

## 2020-07-18 DIAGNOSIS — C787 Secondary malignant neoplasm of liver and intrahepatic bile duct: Secondary | ICD-10-CM | POA: Diagnosis not present

## 2020-07-18 DIAGNOSIS — Z8042 Family history of malignant neoplasm of prostate: Secondary | ICD-10-CM | POA: Insufficient documentation

## 2020-07-18 DIAGNOSIS — C155 Malignant neoplasm of lower third of esophagus: Secondary | ICD-10-CM | POA: Diagnosis not present

## 2020-07-18 LAB — CBC WITH DIFFERENTIAL/PLATELET
Abs Immature Granulocytes: 0.01 10*3/uL (ref 0.00–0.07)
Basophils Absolute: 0 10*3/uL (ref 0.0–0.1)
Basophils Relative: 1 %
Eosinophils Absolute: 0 10*3/uL (ref 0.0–0.5)
Eosinophils Relative: 1 %
HCT: 30.7 % — ABNORMAL LOW (ref 39.0–52.0)
Hemoglobin: 9.8 g/dL — ABNORMAL LOW (ref 13.0–17.0)
Immature Granulocytes: 0 %
Lymphocytes Relative: 26 %
Lymphs Abs: 1 10*3/uL (ref 0.7–4.0)
MCH: 26.6 pg (ref 26.0–34.0)
MCHC: 31.9 g/dL (ref 30.0–36.0)
MCV: 83.2 fL (ref 80.0–100.0)
Monocytes Absolute: 0.8 10*3/uL (ref 0.1–1.0)
Monocytes Relative: 21 %
Neutro Abs: 1.9 10*3/uL (ref 1.7–7.7)
Neutrophils Relative %: 51 %
Platelets: 316 10*3/uL (ref 150–400)
RBC: 3.69 MIL/uL — ABNORMAL LOW (ref 4.22–5.81)
RDW: 14 % (ref 11.5–15.5)
WBC: 3.8 10*3/uL — ABNORMAL LOW (ref 4.0–10.5)
nRBC: 0 % (ref 0.0–0.2)

## 2020-07-18 LAB — COMPREHENSIVE METABOLIC PANEL
ALT: 25 U/L (ref 0–44)
AST: 55 U/L — ABNORMAL HIGH (ref 15–41)
Albumin: 2.9 g/dL — ABNORMAL LOW (ref 3.5–5.0)
Alkaline Phosphatase: 100 U/L (ref 38–126)
Anion gap: 9 (ref 5–15)
BUN: 18 mg/dL (ref 8–23)
CO2: 25 mmol/L (ref 22–32)
Calcium: 8.4 mg/dL — ABNORMAL LOW (ref 8.9–10.3)
Chloride: 101 mmol/L (ref 98–111)
Creatinine, Ser: 0.76 mg/dL (ref 0.61–1.24)
GFR, Estimated: >60 mL/min (ref >60)
Glucose, Bld: 119 mg/dL — ABNORMAL HIGH (ref 70–99)
Potassium: 3.8 mmol/L (ref 3.5–5.1)
Sodium: 135 mmol/L (ref 135–145)
Total Bilirubin: 0.6 mg/dL (ref 0.3–1.2)
Total Protein: 6.7 g/dL (ref 6.5–8.1)

## 2020-07-18 MED ORDER — OXALIPLATIN CHEMO INJECTION 100 MG/20ML
65.0000 mg/m2 | Freq: Once | INTRAVENOUS | Status: AC
  Administered 2020-07-18: 140 mg via INTRAVENOUS
  Filled 2020-07-18: qty 20

## 2020-07-18 MED ORDER — LEUCOVORIN CALCIUM INJECTION 350 MG
850.0000 mg | Freq: Once | INTRAVENOUS | Status: AC
  Administered 2020-07-18: 850 mg via INTRAVENOUS
  Filled 2020-07-18: qty 25

## 2020-07-18 MED ORDER — HEPARIN SOD (PORK) LOCK FLUSH 100 UNIT/ML IV SOLN
500.0000 [IU] | Freq: Once | INTRAVENOUS | Status: DC
  Filled 2020-07-18: qty 5

## 2020-07-18 MED ORDER — DIPHENHYDRAMINE HCL 50 MG/ML IJ SOLN
50.0000 mg | Freq: Once | INTRAMUSCULAR | Status: AC
  Administered 2020-07-18: 50 mg via INTRAVENOUS
  Filled 2020-07-18: qty 1

## 2020-07-18 MED ORDER — ACETAMINOPHEN 325 MG PO TABS
650.0000 mg | ORAL_TABLET | Freq: Once | ORAL | Status: AC
  Administered 2020-07-18: 650 mg via ORAL
  Filled 2020-07-18: qty 2

## 2020-07-18 MED ORDER — DEXTROSE 5 % IV SOLN
Freq: Once | INTRAVENOUS | Status: AC
  Filled 2020-07-18: qty 250

## 2020-07-18 MED ORDER — SODIUM CHLORIDE 0.9 % IV SOLN
Freq: Once | INTRAVENOUS | Status: AC
  Filled 2020-07-18: qty 250

## 2020-07-18 MED ORDER — TRASTUZUMAB-ANNS CHEMO 150 MG IV SOLR
6.0000 mg/kg | Freq: Once | INTRAVENOUS | Status: AC
  Administered 2020-07-18: 546 mg via INTRAVENOUS
  Filled 2020-07-18: qty 26

## 2020-07-18 MED ORDER — PALONOSETRON HCL INJECTION 0.25 MG/5ML
0.2500 mg | Freq: Once | INTRAVENOUS | Status: AC
  Administered 2020-07-18: 0.25 mg via INTRAVENOUS
  Filled 2020-07-18: qty 5

## 2020-07-18 MED ORDER — FLUOROURACIL CHEMO INJECTION 2.5 GM/50ML
400.0000 mg/m2 | Freq: Once | INTRAVENOUS | Status: AC
  Administered 2020-07-18: 850 mg via INTRAVENOUS
  Filled 2020-07-18: qty 17

## 2020-07-18 MED ORDER — SODIUM CHLORIDE 0.9 % IV SOLN
10.0000 mg | Freq: Once | INTRAVENOUS | Status: AC
  Administered 2020-07-18: 10 mg via INTRAVENOUS
  Filled 2020-07-18: qty 10

## 2020-07-18 MED ORDER — SODIUM CHLORIDE 0.9% FLUSH
10.0000 mL | Freq: Once | INTRAVENOUS | Status: AC
  Administered 2020-07-18: 10 mL via INTRAVENOUS
  Filled 2020-07-18: qty 10

## 2020-07-18 MED ORDER — SODIUM CHLORIDE 0.9 % IV SOLN
2400.0000 mg/m2 | INTRAVENOUS | Status: DC
  Administered 2020-07-18: 5100 mg via INTRAVENOUS
  Filled 2020-07-18: qty 102

## 2020-07-18 NOTE — Progress Notes (Signed)
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 Vicente Males which showed a large nearly obstructing mass in the lower third of the esophagus 35 cm from incisors. 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  PD-L1/CPS/NGS pending.   Presently patient reports dysphagia but is able to eat cereal or soft food. He has lost about 13 pounds in the last month itself. Denies any significant pain. He recently retired after many years of service and is otherwise independent of his ADLs and IADLs.  PET CT scan on 06/26/2020 showed circumferential distal esophageal mass with an SUV of 13.3 extending over 5.5 cm. Right lower paratracheal node is 0.6 cm with an SUV of 3.1. Hypermetabolic right gastric lymph nodes including 1.1 cm node with an SUV of 5.8. Numerous hypermetabolic lesions throughout the liver somewhat confluent around the periphery which were hypermetabolic between 8 and 9. Faintly hypermetabolic left adrenal gland   Interval history-patient reports tolerating chemotherapy well and denies any complaints at this time. He has not had any significant nausea or vomiting. He does have occasional right upper quadrant pain for which she has used only 1 dose of oxycodone so far. He is drinking about 3 ensures a day and is able to  swallow his food good  ECOG PS- 1 Pain scale- 2 Opioid associated constipation- no  Review of systems- Review of Systems  Constitutional: Positive for malaise/fatigue. Negative for chills, fever and weight loss.  HENT: Negative for congestion, ear discharge and nosebleeds.   Eyes: Negative for blurred vision.  Respiratory: Negative for cough, hemoptysis, sputum production, shortness of breath and wheezing.   Cardiovascular: Negative for chest pain, palpitations, orthopnea and claudication.  Gastrointestinal: Negative for abdominal pain, blood in stool, constipation, diarrhea, heartburn, melena, nausea and vomiting.  Genitourinary: Negative for dysuria, flank pain, frequency, hematuria and urgency.  Musculoskeletal: Negative for back pain, joint pain and myalgias.  Skin: Negative for rash.  Neurological: Negative for dizziness, tingling, focal weakness, seizures, weakness and headaches.  Endo/Heme/Allergies: Does not bruise/bleed easily.  Psychiatric/Behavioral: Negative for depression and suicidal ideas. The patient does not have insomnia.       No Known Allergies   Past Medical History:  Diagnosis Date  . Esophageal adenocarcinoma ([place]) 06/23/2020  . GERD (gastroesophageal reflux disease)   . History of kidney stones   . Nephrolithiasis    [facility]  . Pancreatitis, acute   . Personal history of colonic polyps    Dr Nicolasa Ducking  . Psoriasis      Past Surgical History:  Procedure Laterality Date  . COLONOSCOPY    . COLONOSCOPY WITH PROPOFOL N/A 03/31/2020   Procedure: COLONOSCOPY WITH PROPOFOL;  Surgeon: Jonathon Bellows, MD;  Location: [facility] ENDOSCOPY;  Service: Gastroenterology;  Laterality: N/A;  . ERCP    . ESOPHAGOGASTRODUODENOSCOPY (EGD) WITH PROPOFOL N/A 06/19/2020   Procedure: ESOPHAGOGASTRODUODENOSCOPY (EGD) WITH PROPOFOL;  Surgeon: Jonathon Bellows, MD;  Location:  [facility] ENDOSCOPY;  Service: Gastroenterology;  Laterality: N/A;  . groin surgery     as a child  . PORTA CATH  INSERTION N/A 06/28/2020   Procedure: PORTA CATH INSERTION;  Surgeon: Algernon Huxley, MD;  Location: [facility];  Service: Cardiovascular;  Laterality: N/A;  . SHOULDER SURGERY      Social History   Socioeconomic History  . Marital status: Married    Spouse name: Not on file  . Number of children: 2  . Years of education: Not on file  . Highest education level: Not on file  Occupational History  . 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 Smoking status: Former Smoker    Types: Cigarettes    Quit date: 07/08/1988    Years since quitting: 32.0  . Smokeless tobacco: Never Used  Vaping Use  . Vaping Use: Never used  Substance and Sexual Activity  . Alcohol use: No    Comment: recovering alcoholic for 13 years  . Drug use: No  . Sexual activity: Not Currently  Other Topics Concern  . Not on file  Social History Narrative   ** Merged History Encounter **       Social Determinants of Health   Financial Resource Strain: Not on file  Food Insecurity: Not on file  Transportation Needs: Not on file  Physical Activity: Not on file  Stress: Not on file  Social Connections: Not on file  Intimate Partner Violence: Not on file    Family History  Problem Relation Age of Onset  . Stomach cancer Mother   . Cancer Mother        stomach cancer  . Diabetes Brother   . Hypertension Brother   . Cancer Father        prostate cancer  . Hypertension Father   . Cancer Sister        colon cancer  . Colon cancer Neg Hx      Current Outpatient Medications:  .  dexamethasone (DECADRON) 4 MG tablet, Take 2 tablets (8 mg total) by mouth daily. Start the day after chemotherapy for 2 days. Take with food., Disp: 30 tablet, Rfl: 1 .  lidocaine-prilocaine (EMLA) cream, Apply 1 application topically as needed. Apply small amount to port site at least 1 hour prior to it being accessed, cover with plastic wrap, Disp: 30 g, Rfl: 3 .  oxyCODONE (OXY  IR/ROXICODONE) 5 MG immediate release tablet, Take 1 tablet (5 mg total) by mouth every 8 (eight) hours as needed for severe pain., Disp: 60 tablet, Rfl: 0 .  pantoprazole (PROTONIX) 40 MG tablet, Take 1 tablet (40 mg total) by mouth 2 (two) times daily before a meal., Disp: 60 tablet, Rfl: 3 .  LORazepam (ATIVAN) 0.5 MG tablet, Take 1 tablet (0.5 mg total) by mouth every 6 (six) hours as needed (Nausea or vomiting). (Patient not taking: No sig reported), Disp: 30 tablet, Rfl: 0 .  ondansetron (ZOFRAN) 8 MG tablet, Take 1 tablet (8 mg total) by mouth 2 (two) times daily as needed for refractory nausea / vomiting. Start on day 3 after chemotherapy. (Patient not taking: No sig reported), Disp: 30 tablet, Rfl: 1 .  prochlorperazine (COMPAZINE) 10 MG tablet, Take 1 tablet (10 mg total) by mouth every 6 (six) hours as needed (Nausea or vomiting). (Patient not taking: No sig reported), Disp: 30 tablet, Rfl: 1 No current facility-administered medications for this visit.  Facility-Administered Medications Ordered  in Other Visits:  .  dextrose 5 % solution, , Intravenous, Once, [name], MD .  fluorouracil (ADRUCIL) 5,100 mg in sodium chloride 0.9 % 148 mL chemo infusion, 2,400 mg/m2 (Treatment Plan Recorded), Intravenous, 1 day or 1 dose, [name], MD .  fluorouracil (ADRUCIL) chemo injection 850 mg, 400 mg/m2 (Treatment Plan Recorded), Intravenous, Once, [name], MD .  heparin lock flush 100 unit/mL, 500 Units, Intravenous, Once, [name], MD .  leucovorin 850 mg in dextrose 5 % 250 mL infusion, 850 mg, Intravenous, Once, [name], MD .  oxaliplatin (ELOXATIN) 140 mg in dextrose 5 % 500 mL chemo infusion, 65 mg/m2 (Treatment Plan Recorded), Intravenous, Once, [name], MD .  Theotis Burrow [facility]) 546 mg in sodium chloride 0.9 % 250 mL chemo infusion, 6 mg/kg (Treatment Plan Recorded), Intravenous, Once, [name], MD, Last Rate: 184 mL/hr at 07/18/20 1132, 546  mg at 07/18/20 1132  Physical exam:  Vitals:   07/18/20 0956  BP: (!) 142/72  Pulse: 78  Resp: 16  Temp: 98 F (36.7 C)  TempSrc: Oral  Weight: 185 lb 8 oz (84.1 kg)  Height: _0  (1.803 m)   Physical Exam Constitutional:      General: He is not in acute distress. Eyes:     Extraocular Movements: EOM normal.  Cardiovascular:     Rate and Rhythm: Normal rate.     Heart sounds: Normal heart sounds.  Pulmonary:     Effort: Pulmonary effort is normal.  Skin:    General: Skin is warm and dry.  Neurological:     Mental Status: He is alert and oriented to person, place, and time.      CMP Latest Ref Rng & Units 07/18/2020  Glucose 70 - 99 mg/dL 119(H)  BUN 8 - 23 mg/dL 18  Creatinine 0.61 - 1.24 mg/dL 0.76  Sodium 135 - 145 mmol/L 135  Potassium 3.5 - 5.1 mmol/L 3.8  Chloride 98 - 111 mmol/L 101  CO2 22 - 32 mmol/L 25  Calcium 8.9 - 10.3 mg/dL 8.4(L)  Total Protein 6.5 - 8.1 g/dL 6.7  Total Bilirubin 0.3 - 1.2 mg/dL 0.6  Alkaline Phos 38 - 126 U/L 100  AST 15 - 41 U/L 55(H)  ALT 0 - 44 U/L 25   CBC Latest Ref Rng & Units 07/18/2020  WBC 4.0 - 10.5 K/uL 3.8(L)  Hemoglobin 13.0 - 17.0 g/dL 9.8(L)  Hematocrit 39.0 - 52.0 % 30.7(L)  Platelets 150 - 400 K/uL 316    No images are attached to the encounter.  DG Chest 2 View  Result Date: 07/06/2020 CLINICAL DATA:  Fever, history of esophageal cancer. EXAM: CHEST - 2 VIEW COMPARISON:  June 03, 2005. FINDINGS: The heart size and mediastinal contours are within normal limits. Both lungs are clear. No pneumothorax or pleural effusion is noted. Right internal jugular Port-A-Cath is noted with tip in expected position of the SVC. The visualized skeletal structures are unremarkable. IMPRESSION: No active cardiopulmonary disease. Electronically Signed   By: Marijo Conception M.D.   On: 07/06/2020 08:17   PERIPHERAL VASCULAR CATHETERIZATION  Result Date: 06/28/2020 See op note  NM PET Image Initial (PI) Skull Base To  Thigh  Result Date: 06/26/2020 CLINICAL DATA:  Initial treatment strategy for esophageal adenocarcinoma. EXAM: NUCLEAR MEDICINE PET SKULL BASE TO THIGH TECHNIQUE: 10.8 mCi F-18 FDG was injected intravenously. Full-ring PET imaging was performed from the skull base to thigh after the  radiotracer. CT data was obtained and used for attenuation correction and anatomic localization. Fasting blood glucose: 68 mg/dl COMPARISON:  CT abdomen 03/25/2012 FINDINGS: Mediastinal blood pool activity: SUV max 2.1 Liver activity: SUV max NA NECK: No significant abnormal hypermetabolic activity in this region. Incidental CT findings: none CHEST: Circumferential distal esophageal mass has a maximum SUV of 13.3 and extends over a 5.5 cm length. Right lower paratracheal lymph node 0.6 cm in short axis on image 68 series 4, maximum SUV 3.1. Incidental CT findings: none ABDOMEN/PELVIS: Hypermetabolic right gastric lymph nodes are present including a 1.1 cm in short axis node on image 114 of series 4 with maximum SUV 5.8. 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 Index lesion anteriorly in the lateral segment left hepatic lobe about at the level of image 117 of series 4 has a maximum SUV of 8.0. A posterior right hepatic lobe lesion about at the level of image 111 has a maximum SUV of 9.0. Faintly hypermetabolic left adrenal gland without a discretely visualized mass, maximum SUV 4.1. Probably incidental but merits surveillance. Accentuated metabolic activity along the small bowel adjacent to the cecum, maximum SUV 11.8, no definite CT abnormality, questionable physiologic activity. Incidental CT findings: Aortoiliac atherosclerotic vascular disease. Nonobstructive bilateral nephrolithiasis. SKELETON: No significant abnormal hypermetabolic activity in this region.  Incidental CT findings: Bridging spurring of the sacroiliac joints, left greater than right. Mild levoconvex lumbar scoliosis with rotary component. Degenerative arthropathy of both glenohumeral joints. IMPRESSION: 1. Hypermetabolic distal esophageal mass, maximum SUV 13.3, about 5.5 cm in length. 2. Hypermetabolic right gastric lymph nodes with numerous somewhat confluent peripheral hypermetabolic lesions scattered throughout the liver, causing a pseudo cirrhosis appearance. 3. Faintly accentuated metabolic activity in the left adrenal gland without a discrete visualized mass, probably incidental. 4. Bilateral nonobstructive nephrolithiasis. 5.  Aortic Atherosclerosis (ICD10-I70.0). Electronically Signed   By: Van Clines M.D.   On: 06/26/2020 15:00   ECHOCARDIOGRAM COMPLETE  Result Date: 07/17/2020    ECHOCARDIOGRAM REPORT   Patient Name:   AIRAM HEIDECKER Date of Exam: 07/17/2020 Medical Rec #:  [id number]      Height:       71.0 in Accession #:    [id number]     Weight:       194.0 lb Date of Birth:  [birth year]     BSA:          2.081 m Patient Age:    65 years       BP:           122/64 mmHg Patient Gender: M              HR:           81 bpm. Exam Location:  [facility] Procedure: 2D Echo, Color Doppler, Cardiac Doppler and Strain Analysis Indications:     Z09 Chemo  History:         Patient has no prior history of Echocardiogram examinations. 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  Sonographer:     Charmayne Sheer RDCS (AE) Referring Phys:  [id number] Weston Anna RAO Diagnosing Phys: Ida Rogue MD  Sonographer Comments: Global longitudinal strain was attempted. IMPRESSIONS  1. Left ventricular ejection fraction, by estimation, is 60 to 65%. The left ventricle has  normal function. The left ventricle has no regional wall motion abnormalities. Left ventricular diastolic parameters are consistent with Grade I diastolic dysfunction (impaired relaxation). The average left  ventricular global longitudinal strain is -14.5 %.  2. Right ventricular systolic function is normal. The right ventricular size is normal. Tricuspid regurgitation signal is inadequate for assessing PA pressure.  3. The mitral valve is normal in structure. Mild mitral valve regurgitation. FINDINGS  Left Ventricle: Left ventricular ejection fraction, by estimation, is 60 to 65%. The left ventricle has normal function. The left ventricle has no regional wall motion abnormalities. The average left ventricular global longitudinal strain is -14.5 %. The left ventricular internal cavity size was normal in size. There is no left ventricular hypertrophy. Left ventricular diastolic parameters are consistent with Grade I diastolic dysfunction (impaired relaxation). Right Ventricle: The right ventricular size is normal. No increase in right ventricular wall thickness. Right ventricular systolic function is normal. Tricuspid regurgitation signal is inadequate for assessing PA pressure. Left Atrium: Left atrial size was normal in size. Right Atrium: Right atrial size was normal in size. Pericardium: There is no evidence of pericardial effusion. Mitral Valve: The mitral valve is normal in structure. Mild mitral valve regurgitation. No evidence of mitral valve stenosis. MV peak gradient, 4.9 mmHg. The mean mitral valve gradient is 3.0 mmHg. Tricuspid Valve: The tricuspid valve is normal in structure. Tricuspid valve regurgitation is mild . No evidence of tricuspid stenosis. Aortic Valve: The aortic valve is normal in structure. Aortic valve regurgitation is not visualized. No aortic stenosis is present. Aortic valve mean gradient measures 3.0 mmHg. Aortic valve peak gradient measures 6.6 mmHg. Aortic valve area, by VTI measures 3.91 cm. Pulmonic Valve: The pulmonic valve was normal in structure. Pulmonic valve regurgitation is not visualized. No evidence of pulmonic stenosis. Aorta: The aortic root is normal in size and  structure. Venous: The inferior vena cava is normal in size with greater than 50% respiratory variability, suggesting right atrial pressure of 3 mmHg. IAS/Shunts: No atrial level shunt detected by color flow Doppler.  LEFT VENTRICLE PLAX 2D LVIDd:         4.80 cm  Diastology LVIDs:         3.30 cm  LV e' medial:    7.51 cm/s LV PW:         1.00 cm  LV E/e' medial:  10.7 LV IVS:        1.00 cm  LV e' lateral:   8.70 cm/s LVOT diam:     2.20 cm  LV E/e' lateral: 9.3 LV SV:         84 LV SV Index:   40       2D Longitudinal Strain LVOT Area:     3.80 cm 2D Strain GLS (A2C):   -15.4 %                         2D Strain GLS (A3C):   -14.1 %                         2D Strain GLS (A4C):   -14.1 %                         2D Strain GLS Avg:     -14.5 % RIGHT VENTRICLE RV Basal diam:  3.60 cm TAPSE (M-mode): 3.1 cm LEFT ATRIUM  Index       RIGHT ATRIUM           Index LA diam:        3.00 cm 1.44 cm/m  RA Area:     15.60 cm LA Vol (A2C):   47.7 ml 22.92 ml/m RA Volume:   35.90 ml  17.25 ml/m LA Vol (A4C):   52.2 ml 25.08 ml/m LA Biplane Vol: 51.0 ml 24.50 ml/m  AORTIC VALVE                   PULMONIC VALVE AV Area (Vmax):    3.24 cm    PV Vmax:       1.00 m/s AV Area (Vmean):   3.36 cm    PV Vmean:      62.700 cm/s AV Area (VTI):     3.91 cm    PV VTI:        0.164 m AV Vmax:           128.00 cm/s PV Peak grad:  4.0 mmHg AV Vmean:          87.000 cm/s PV Mean grad:  2.0 mmHg AV VTI:            0.214 m AV Peak Grad:      6.6 mmHg AV Mean Grad:      3.0 mmHg LVOT Vmax:         109.00 cm/s LVOT Vmean:        77.000 cm/s LVOT VTI:          0.220 m LVOT/AV VTI ratio: 1.03  AORTA Ao Root diam: 3.60 cm MITRAL VALVE MV Area (PHT): 5.31 cm    SHUNTS MV Peak grad:  4.9 mmHg    Systemic VTI:  0.22 m MV Mean grad:  3.0 mmHg    Systemic Diam: 2.20 cm MV Vmax:       1.11 m/s MV Vmean:      75.9 cm/s MV Decel Time: 143 msec MV E velocity: 80.60 cm/s MV A velocity: 87.40 cm/s MV E/A ratio:  0.92 Ida Rogue MD  Electronically signed by Ida Rogue MD Signature Date/Time: 07/17/2020/1:00:41 PM    Final      Assessment and plan- Patient is a 65 y.o. 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 He is here for on treatment assessment prior to cycle 2 of FOLFOX chemotherapy with trastuzumab  Patient's tumor was HER2 positive and therefore per toga trial he would benefit from addition of trastuzumab to FOLFOX which she will start today. Patient is also receiving palliative radiation to his lower esophageal mass which she will complete on 2 4 2022. Baseline echocardiogram was normal. Again discussed risk and benefits of trastuzumab including all but not limited to skin rash and heart failure. Patient understands and agrees to proceed as planned. Treatment will be given with a palliative intent  I am currently awaiting NGS testing but I do plan to add immunotherapy as well after he completes palliative radiation treatment  I will see him back in 2 weeks time for FOLFOX/trastuzumab   Visit Diagnosis 1. Encounter for antineoplastic chemotherapy   2. Esophageal adenocarcinoma ([place])   3. Goals of care, counseling/discussion   4. Encounter for monoclonal antibody treatment for malignancy      Dr. Randa Evens, MD, MPH [facility] [phone] 07/18/2020 12:47 PM

## 2020-07-19 ENCOUNTER — Ambulatory Visit: Payer: PRIVATE HEALTH INSURANCE | Admitting: Gastroenterology

## 2020-07-19 ENCOUNTER — Ambulatory Visit
Admission: RE | Admit: 2020-07-19 | Discharge: 2020-07-19 | Disposition: A | Discharge status: Home / Self Care | Payer: Medicare Other | Source: Ambulatory Visit | Attending: Radiation Oncology | Admitting: Radiation Oncology

## 2020-07-19 DIAGNOSIS — C155 Malignant neoplasm of lower third of esophagus: Secondary | ICD-10-CM | POA: Diagnosis not present

## 2020-07-19 LAB — CEA: CEA: 547 ng/mL — ABNORMAL HIGH (ref 0.0–4.7)

## 2020-07-20 ENCOUNTER — Ambulatory Visit
Admission: RE | Admit: 2020-07-20 | Discharge: 2020-07-20 | Disposition: A | Discharge status: Home / Self Care | Payer: Medicare Other | Source: Ambulatory Visit | Attending: Radiation Oncology | Admitting: Radiation Oncology

## 2020-07-20 ENCOUNTER — Inpatient Hospital Stay: Payer: Medicare Other

## 2020-07-20 ENCOUNTER — Other Ambulatory Visit: Payer: Self-pay

## 2020-07-20 DIAGNOSIS — C155 Malignant neoplasm of lower third of esophagus: Secondary | ICD-10-CM | POA: Diagnosis not present

## 2020-07-20 DIAGNOSIS — Z5112 Encounter for antineoplastic immunotherapy: Secondary | ICD-10-CM | POA: Diagnosis not present

## 2020-07-20 DIAGNOSIS — C159 Malignant neoplasm of esophagus, unspecified: Secondary | ICD-10-CM

## 2020-07-20 MED ORDER — PEGFILGRASTIM-CBQV 6 MG/0.6ML ~~LOC~~ SOSY
6.0000 mg | PREFILLED_SYRINGE | Freq: Once | SUBCUTANEOUS | Status: AC
  Administered 2020-07-20: 6 mg via SUBCUTANEOUS
  Filled 2020-07-20: qty 0.6

## 2020-07-20 MED ORDER — HEPARIN SOD (PORK) LOCK FLUSH 100 UNIT/ML IV SOLN
500.0000 [IU] | Freq: Once | INTRAVENOUS | Status: AC | PRN
  Administered 2020-07-20: 500 [IU]
  Filled 2020-07-20: qty 5

## 2020-07-20 MED ORDER — SODIUM CHLORIDE 0.9% FLUSH
10.0000 mL | INTRAVENOUS | Status: DC | PRN
  Administered 2020-07-20: 10 mL
  Filled 2020-07-20: qty 10

## 2020-07-21 ENCOUNTER — Ambulatory Visit
Admission: RE | Admit: 2020-07-21 | Discharge: 2020-07-21 | Disposition: A | Discharge status: Home / Self Care | Payer: Medicare Other | Source: Ambulatory Visit | Attending: Radiation Oncology | Admitting: Radiation Oncology

## 2020-07-21 DIAGNOSIS — C155 Malignant neoplasm of lower third of esophagus: Secondary | ICD-10-CM | POA: Diagnosis not present

## 2020-07-24 ENCOUNTER — Ambulatory Visit
Admission: RE | Admit: 2020-07-24 | Discharge: 2020-07-24 | Disposition: A | Discharge status: Home / Self Care | Payer: Medicare Other | Source: Ambulatory Visit | Attending: Radiation Oncology | Admitting: Radiation Oncology

## 2020-07-24 DIAGNOSIS — C155 Malignant neoplasm of lower third of esophagus: Secondary | ICD-10-CM | POA: Diagnosis not present

## 2020-07-25 ENCOUNTER — Ambulatory Visit
Admission: RE | Admit: 2020-07-25 | Discharge: 2020-07-25 | Disposition: A | Discharge status: Home / Self Care | Payer: Medicare Other | Source: Ambulatory Visit | Attending: Radiation Oncology | Admitting: Radiation Oncology

## 2020-07-25 DIAGNOSIS — C155 Malignant neoplasm of lower third of esophagus: Secondary | ICD-10-CM | POA: Diagnosis not present

## 2020-07-26 ENCOUNTER — Other Ambulatory Visit: Payer: Self-pay | Admitting: *Deleted

## 2020-07-26 ENCOUNTER — Inpatient Hospital Stay (HOSPITAL_BASED_OUTPATIENT_CLINIC_OR_DEPARTMENT_OTHER): Payer: Medicare Other | Admitting: Hospice and Palliative Medicine

## 2020-07-26 ENCOUNTER — Ambulatory Visit
Admission: RE | Admit: 2020-07-26 | Discharge: 2020-07-26 | Disposition: A | Discharge status: Home / Self Care | Payer: Medicare Other | Source: Ambulatory Visit | Attending: Radiation Oncology | Admitting: Radiation Oncology

## 2020-07-26 ENCOUNTER — Other Ambulatory Visit: Payer: Self-pay

## 2020-07-26 DIAGNOSIS — C159 Malignant neoplasm of esophagus, unspecified: Secondary | ICD-10-CM

## 2020-07-26 DIAGNOSIS — C155 Malignant neoplasm of lower third of esophagus: Secondary | ICD-10-CM | POA: Diagnosis not present

## 2020-07-26 NOTE — Progress Notes (Signed)
Multidisciplinary Oncology Council Documentation  Cory [name] was presented by our [facility] on 07/26/2020, which included representatives from:  . Palliative Care . Dietitian . Physical/Occupational Therapist . Speech Therapist . Nurse Navigator . Genetics . Social work . Hotel manager . Research RN . [facility] . Survivorship Nurse  Cory [name] currently presents with history of stage IV H&N Cancer  We reviewed previous medical and familial history, history of present illness, and recent lab results along with all available histopathologic and imaging studies. The [place] considered available treatment options and made the following recommendations/referrals:  Recommend the following: Genetics, ST, Nutrition, and palliative care  The MOC is a meeting of clinicians from various specialty areas who evaluate and discuss patients for whom a multidisciplinary approach is being considered. Final determinations in the plan of care are those of the provider(s).   Today's extended care, comprehensive team conference, Cory [name] was not present for the discussion and was not examined.

## 2020-07-27 ENCOUNTER — Ambulatory Visit
Admission: RE | Admit: 2020-07-27 | Discharge: 2020-07-27 | Disposition: A | Discharge status: Home / Self Care | Payer: Medicare Other | Source: Ambulatory Visit | Attending: Radiation Oncology | Admitting: Radiation Oncology

## 2020-07-27 DIAGNOSIS — C155 Malignant neoplasm of lower third of esophagus: Secondary | ICD-10-CM | POA: Diagnosis not present

## 2020-07-28 ENCOUNTER — Ambulatory Visit
Admission: RE | Admit: 2020-07-28 | Discharge: 2020-07-28 | Disposition: A | Discharge status: Home / Self Care | Payer: Medicare Other | Source: Ambulatory Visit | Attending: Radiation Oncology | Admitting: Radiation Oncology

## 2020-07-28 DIAGNOSIS — C155 Malignant neoplasm of lower third of esophagus: Secondary | ICD-10-CM | POA: Diagnosis not present

## 2020-07-31 ENCOUNTER — Encounter: Payer: Self-pay | Admitting: Oncology

## 2020-07-31 ENCOUNTER — Ambulatory Visit
Admission: RE | Admit: 2020-07-31 | Discharge: 2020-07-31 | Disposition: A | Discharge status: Home / Self Care | Payer: Medicare Other | Source: Ambulatory Visit | Attending: Radiation Oncology | Admitting: Radiation Oncology

## 2020-07-31 DIAGNOSIS — C155 Malignant neoplasm of lower third of esophagus: Secondary | ICD-10-CM | POA: Diagnosis not present

## 2020-08-01 ENCOUNTER — Ambulatory Visit
Admission: RE | Admit: 2020-08-01 | Discharge: 2020-08-01 | Disposition: A | Discharge status: Home / Self Care | Payer: Medicare Other | Source: Ambulatory Visit | Attending: Radiation Oncology | Admitting: Radiation Oncology

## 2020-08-01 ENCOUNTER — Inpatient Hospital Stay: Payer: Medicare Other

## 2020-08-01 ENCOUNTER — Inpatient Hospital Stay (HOSPITAL_BASED_OUTPATIENT_CLINIC_OR_DEPARTMENT_OTHER): Payer: Medicare Other | Admitting: Oncology

## 2020-08-01 ENCOUNTER — Encounter: Payer: Self-pay | Admitting: Oncology

## 2020-08-01 VITALS — BP 119/73 | HR 77 | Temp 98.1°F | Resp 20 | Wt 184.4 lb

## 2020-08-01 DIAGNOSIS — Z5111 Encounter for antineoplastic chemotherapy: Secondary | ICD-10-CM

## 2020-08-01 DIAGNOSIS — C155 Malignant neoplasm of lower third of esophagus: Secondary | ICD-10-CM | POA: Diagnosis not present

## 2020-08-01 DIAGNOSIS — Z5112 Encounter for antineoplastic immunotherapy: Secondary | ICD-10-CM | POA: Diagnosis not present

## 2020-08-01 DIAGNOSIS — C159 Malignant neoplasm of esophagus, unspecified: Secondary | ICD-10-CM | POA: Diagnosis not present

## 2020-08-01 LAB — CBC WITH DIFFERENTIAL/PLATELET
Abs Immature Granulocytes: 0.1 10*3/uL — ABNORMAL HIGH (ref 0.00–0.07)
Basophils Absolute: 0 10*3/uL (ref 0.0–0.1)
Basophils Relative: 0 %
Eosinophils Absolute: 0 10*3/uL (ref 0.0–0.5)
Eosinophils Relative: 0 %
HCT: 32.8 % — ABNORMAL LOW (ref 39.0–52.0)
Hemoglobin: 10.2 g/dL — ABNORMAL LOW (ref 13.0–17.0)
Immature Granulocytes: 1 %
Lymphocytes Relative: 13 %
Lymphs Abs: 0.9 10*3/uL (ref 0.7–4.0)
MCH: 25.9 pg — ABNORMAL LOW (ref 26.0–34.0)
MCHC: 31.1 g/dL (ref 30.0–36.0)
MCV: 83.2 fL (ref 80.0–100.0)
Monocytes Absolute: 0.5 10*3/uL (ref 0.1–1.0)
Monocytes Relative: 7 %
Neutro Abs: 5.6 10*3/uL (ref 1.7–7.7)
Neutrophils Relative %: 79 %
Platelets: 321 10*3/uL (ref 150–400)
RBC: 3.94 MIL/uL — ABNORMAL LOW (ref 4.22–5.81)
RDW: 15.3 % (ref 11.5–15.5)
WBC: 7.2 10*3/uL (ref 4.0–10.5)
nRBC: 0 % (ref 0.0–0.2)

## 2020-08-01 LAB — COMPREHENSIVE METABOLIC PANEL
ALT: 19 U/L (ref 0–44)
AST: 24 U/L (ref 15–41)
Albumin: 3 g/dL — ABNORMAL LOW (ref 3.5–5.0)
Alkaline Phosphatase: 110 U/L (ref 38–126)
Anion gap: 11 (ref 5–15)
BUN: 15 mg/dL (ref 8–23)
CO2: 24 mmol/L (ref 22–32)
Calcium: 8.8 mg/dL — ABNORMAL LOW (ref 8.9–10.3)
Chloride: 103 mmol/L (ref 98–111)
Creatinine, Ser: 0.84 mg/dL (ref 0.61–1.24)
GFR, Estimated: >60 mL/min (ref >60)
Glucose, Bld: 106 mg/dL — ABNORMAL HIGH (ref 70–99)
Potassium: 4.2 mmol/L (ref 3.5–5.1)
Sodium: 138 mmol/L (ref 135–145)
Total Bilirubin: 0.4 mg/dL (ref 0.3–1.2)
Total Protein: 6.7 g/dL (ref 6.5–8.1)

## 2020-08-01 MED ORDER — DIPHENHYDRAMINE HCL 50 MG/ML IJ SOLN
50.0000 mg | Freq: Once | INTRAMUSCULAR | Status: AC
  Administered 2020-08-01: 50 mg via INTRAVENOUS
  Filled 2020-08-01: qty 1

## 2020-08-01 MED ORDER — PALONOSETRON HCL INJECTION 0.25 MG/5ML
0.2500 mg | Freq: Once | INTRAVENOUS | Status: AC
  Administered 2020-08-01: 0.25 mg via INTRAVENOUS
  Filled 2020-08-01: qty 5

## 2020-08-01 MED ORDER — FLUOROURACIL CHEMO INJECTION 2.5 GM/50ML
400.0000 mg/m2 | Freq: Once | INTRAVENOUS | Status: AC
  Administered 2020-08-01: 850 mg via INTRAVENOUS
  Filled 2020-08-01: qty 17

## 2020-08-01 MED ORDER — SODIUM CHLORIDE 0.9 % IV SOLN
INTRAVENOUS | Status: DC
  Filled 2020-08-01: qty 250

## 2020-08-01 MED ORDER — ACETAMINOPHEN 325 MG PO TABS
650.0000 mg | ORAL_TABLET | Freq: Once | ORAL | Status: AC
  Administered 2020-08-01: 650 mg via ORAL
  Filled 2020-08-01: qty 2

## 2020-08-01 MED ORDER — TRASTUZUMAB-ANNS CHEMO 150 MG IV SOLR: 4.0000 mg/kg | Freq: Once | INTRAVENOUS | Status: DC

## 2020-08-01 MED ORDER — TRASTUZUMAB-ANNS CHEMO 150 MG IV SOLR
4.0000 mg/kg | Freq: Once | INTRAVENOUS | Status: AC
  Administered 2020-08-01: 336 mg via INTRAVENOUS
  Filled 2020-08-01: qty 16

## 2020-08-01 MED ORDER — DEXTROSE 5 % IV SOLN
65.0000 mg/m2 | Freq: Once | INTRAVENOUS | Status: AC
  Administered 2020-08-01: 140 mg via INTRAVENOUS
  Filled 2020-08-01: qty 20

## 2020-08-01 MED ORDER — SODIUM CHLORIDE 0.9 % IV SOLN
2400.0000 mg/m2 | INTRAVENOUS | Status: DC
  Administered 2020-08-01: 5100 mg via INTRAVENOUS
  Filled 2020-08-01: qty 102

## 2020-08-01 MED ORDER — DEXTROSE 5 % IV SOLN
Freq: Once | INTRAVENOUS | Status: AC
  Filled 2020-08-01: qty 250

## 2020-08-01 MED ORDER — LEUCOVORIN CALCIUM INJECTION 350 MG
850.0000 mg | Freq: Once | INTRAVENOUS | Status: AC
  Administered 2020-08-01: 850 mg via INTRAVENOUS
  Filled 2020-08-01: qty 25

## 2020-08-01 MED ORDER — SODIUM CHLORIDE 0.9 % IV SOLN
10.0000 mg | Freq: Once | INTRAVENOUS | Status: AC
  Administered 2020-08-01: 10 mg via INTRAVENOUS
  Filled 2020-08-01: qty 10

## 2020-08-01 MED ORDER — SODIUM CHLORIDE 0.9% FLUSH
10.0000 mL | Freq: Once | INTRAVENOUS | Status: AC
  Administered 2020-08-01: 10 mL via INTRAVENOUS
  Filled 2020-08-01: qty 10

## 2020-08-01 NOTE — Progress Notes (Signed)
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 Vicente Males which showed a large nearly obstructing mass in the lower third of the esophagus 35 cm from incisors. This was biopsied and was consistent with adenocarcinoma.HER-2 positive. PD-L1/CPS/NGS pending.   Presently patient reports dysphagia but is able to eat cereal or soft food. He has lost about 13 pounds in the last month itself. Denies any significant pain. He recently retired after many years of service and is otherwise independent of his ADLs and IADLs.  PET CT scan on 06/26/2020 showed circumferential distal esophageal mass with an SUV of 13.3 extending over 5.5 cm. Right lower paratracheal node is 0.6 cm with an SUV of 3.1. Hypermetabolic right gastric lymph nodes including 1.1 cm node with an SUV of 5.8. Numerous hypermetabolic lesions throughout the liver somewhat confluent around the periphery which were hypermetabolic between 8 and 9. Faintly hypermetabolic left adrenal gland  NGS testing showed high tumor mutational burden CPS score less than 1.  ERBB2 gain but no other actionable mutations  Interval history-patient reports doing well.  He has not used any oxycodone.  Swallowing has improved.  Overall patient feels much better since he started chemotherapy.  ECOG PS- 1 Pain scale-  0 Opioid associated constipation- no  Review of systems- Review of Systems  Constitutional: Negative for chills, fever, malaise/fatigue and weight loss.  HENT: Negative for congestion, ear discharge and nosebleeds.   Eyes: Negative for blurred vision.  Respiratory: Negative for cough, hemoptysis, sputum production, shortness of breath and wheezing.   Cardiovascular: Negative for chest pain, palpitations, orthopnea and claudication.  Gastrointestinal: Negative for abdominal pain, blood in stool, constipation, diarrhea, heartburn, melena, nausea and vomiting.  Genitourinary: Negative for dysuria, flank pain, frequency, hematuria and urgency.  Musculoskeletal: Negative for back pain, joint pain and myalgias.  Skin: Negative for rash.  Neurological: Negative for dizziness, tingling, focal weakness, seizures, weakness and headaches.  Endo/Heme/Allergies: Does not bruise/bleed easily.  Psychiatric/Behavioral: Negative for depression and suicidal ideas. The patient does not have insomnia.        No Known Allergies   Past Medical History:  Diagnosis Date  . Esophageal adenocarcinoma ([place]) 06/23/2020  . GERD (gastroesophageal reflux disease)   . History of kidney stones   . Nephrolithiasis    [facility]  . Pancreatitis, acute   . Personal history of colonic polyps    Dr Nicolasa Ducking  . Psoriasis      Past Surgical History:  Procedure Laterality Date  . COLONOSCOPY    . COLONOSCOPY WITH PROPOFOL N/A 03/31/2020   Procedure: COLONOSCOPY WITH PROPOFOL;  Surgeon: Jonathon Bellows, MD;  Location: [facility] ENDOSCOPY;  Service: Gastroenterology;  Laterality: N/A;  . ERCP    . ESOPHAGOGASTRODUODENOSCOPY (EGD) WITH PROPOFOL N/A 06/19/2020   Procedure: ESOPHAGOGASTRODUODENOSCOPY (EGD) WITH PROPOFOL;  Surgeon: Jonathon Bellows, MD;  Location: [facility] ENDOSCOPY;  Service: Gastroenterology;  Laterality: N/A;  . groin surgery     as  a child  . PORTA CATH INSERTION N/A 06/28/2020   Procedure: PORTA CATH INSERTION;   Surgeon: Algernon Huxley, MD;  Location: [facility] LAB;  Service: Cardiovascular;  Laterality: N/A;  . SHOULDER SURGERY      Social History   Socioeconomic History  . Marital status: Married    Spouse name: Not on file  . Number of children: 2  . Years of education: Not on file  . Highest education level: Not on file  Occupational History  . 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 Smoking status: Former Smoker    Types: Cigarettes    Quit date: 07/08/1988    Years since quitting: 32.0  . Smokeless tobacco: Never Used  Vaping Use  . Vaping Use: Never used  Substance and Sexual Activity  . Alcohol use: No    Comment: recovering alcoholic for 13 years  . Drug use: No  . Sexual activity: Not Currently  Other Topics Concern  . Not on file  Social History Narrative   ** Merged History Encounter **       Social Determinants of Health   Financial Resource Strain: Not on file  Food Insecurity: Not on file  Transportation Needs: Not on file  Physical Activity: Not on file  Stress: Not on file  Social Connections: Not on file  Intimate Partner Violence: Not on file    Family History  Problem Relation Age of Onset  . Stomach cancer Mother   . Cancer Mother        stomach cancer  . Diabetes Brother   . Hypertension Brother   . Cancer Father        prostate cancer  . Hypertension Father   . Cancer Sister        colon cancer  . Colon cancer Neg Hx      Current Outpatient Medications:  .  dexamethasone (DECADRON) 4 MG tablet, Take 2 tablets (8 mg total) by mouth daily. Start the day after chemotherapy for 2 days. Take with food., Disp: 30 tablet, Rfl: 1 .  lidocaine-prilocaine (EMLA) cream, Apply 1 application topically as needed. Apply small amount to port site at least 1 hour prior to it being accessed, cover with plastic wrap, Disp: 30 g, Rfl: 3 .  LORazepam (ATIVAN) 0.5 MG tablet, Take 1 tablet (0.5 mg total) by mouth every  6 (six) hours as needed (Nausea or vomiting). (Patient not taking: No sig reported), Disp: 30 tablet, Rfl: 0 .  ondansetron (ZOFRAN) 8 MG tablet, Take 1 tablet (8 mg total) by mouth 2 (two) times daily as needed for refractory nausea / vomiting. Start on day 3 after chemotherapy. (Patient not taking: No sig reported), Disp: 30 tablet, Rfl: 1 .  oxyCODONE (OXY IR/ROXICODONE) 5 MG immediate release tablet, Take 1 tablet (5 mg total) by mouth every 8 (eight) hours as needed for severe pain., Disp: 60 tablet, Rfl: 0 .  pantoprazole (PROTONIX) 40 MG tablet, Take 1 tablet (40 mg total) by mouth 2 (two) times daily before a meal., Disp: 60 tablet, Rfl: 3 .  prochlorperazine (COMPAZINE) 10 MG tablet, Take 1 tablet (10 mg total) by mouth every 6 (six) hours as needed (Nausea or vomiting). (Patient not taking: No sig reported), Disp: 30 tablet, Rfl: 1 No current facility-administered medications for this visit.  Facility-Administered Medications Ordered in Other Visits:  .  0.9 %  sodium chloride infusion, , Intravenous, Continuous, Randa Evens  C, MD, Last Rate: 20 mL/hr at 08/01/20 1119, New Bag at 08/01/20 1119 .  fluorouracil (ADRUCIL) 5,100 mg in sodium chloride 0.9 % 148 mL chemo infusion, 2,400 mg/m2 (Treatment Plan Recorded), Intravenous, 1 day or 1 dose, [name], MD .  fluorouracil (ADRUCIL) chemo injection 850 mg, 400 mg/m2 (Treatment Plan Recorded), Intravenous, Once, [name], MD .  leucovorin 850 mg in dextrose 5 % 250 mL infusion, 850 mg, Intravenous, Once, [name], MD, Last Rate: 146 mL/hr at 08/01/20 1246, 850 mg at 08/01/20 1246 .  oxaliplatin (ELOXATIN) 140 mg in dextrose 5 % 500 mL chemo infusion, 65 mg/m2 (Treatment Plan Recorded), Intravenous, Once, [name], MD, Last Rate: 264 mL/hr at 08/01/20 1247, 140 mg at 08/01/20 1247  Physical exam:  Vitals:   08/01/20 1005  BP: 119/73  Pulse: 77  Resp: 20  Temp: 98.1 F (36.7 C)  TempSrc: Tympanic  SpO2: 100%   Weight: 184 lb 6.4 oz (83.6 kg)   Physical Exam Constitutional:      General: He is not in acute distress. Eyes:     Extraocular Movements: EOM normal.  Cardiovascular:     Rate and Rhythm: Normal rate and regular rhythm.     Heart sounds: Normal heart sounds.  Pulmonary:     Effort: Pulmonary effort is normal.     Breath sounds: Normal breath sounds.  Abdominal:     General: Bowel sounds are normal.     Palpations: Abdomen is soft.  Skin:    General: Skin is warm and dry.  Neurological:     Mental Status: He is alert and oriented to person, place, and time.      CMP Latest Ref Rng & Units 08/01/2020  Glucose 70 - 99 mg/dL 106(H)  BUN 8 - 23 mg/dL 15  Creatinine 0.61 - 1.24 mg/dL 0.84  Sodium 135 - 145 mmol/L 138  Potassium 3.5 - 5.1 mmol/L 4.2  Chloride 98 - 111 mmol/L 103  CO2 22 - 32 mmol/L 24  Calcium 8.9 - 10.3 mg/dL 8.8(L)  Total Protein 6.5 - 8.1 g/dL 6.7  Total Bilirubin 0.3 - 1.2 mg/dL 0.4  Alkaline Phos 38 - 126 U/L 110  AST 15 - 41 U/L 24  ALT 0 - 44 U/L 19   CBC Latest Ref Rng & Units 08/01/2020  WBC 4.0 - 10.5 K/uL 7.2  Hemoglobin 13.0 - 17.0 g/dL 10.2(L)  Hematocrit 39.0 - 52.0 % 32.8(L)  Platelets 150 - 400 K/uL 321    No images are attached to the encounter.  DG Chest 2 View  Result Date: 07/06/2020 CLINICAL DATA:  Fever, history of esophageal cancer. EXAM: CHEST - 2 VIEW COMPARISON:  June 03, 2005. FINDINGS: The heart size and mediastinal contours are within normal limits. Both lungs are clear. No pneumothorax or pleural effusion is noted. Right internal jugular Port-A-Cath is noted with tip in expected position of the SVC. The visualized skeletal structures are unremarkable. IMPRESSION: No active cardiopulmonary disease. Electronically Signed   By: Marijo Conception M.D.   On: 07/06/2020 08:17   ECHOCARDIOGRAM COMPLETE  Result Date: 07/17/2020    ECHOCARDIOGRAM REPORT   Patient Name:   MISHAWN HEMANN Date of Exam: 07/17/2020 Medical Rec #:   [id number]      Height:       71.0 in Accession #:    [id number]     Weight:       194.0 lb Date of Birth:  [birth year]  BSA:          2.081 m Patient Age:    53 years       BP:           122/64 mmHg Patient Gender: M              HR:           81 bpm. Exam Location:  [facility] Procedure: 2D Echo, Color Doppler, Cardiac Doppler and Strain Analysis Indications:     Z09 Chemo  History:         Patient has no prior history of Echocardiogram examinations. 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  Sonographer:     Charmayne Sheer RDCS (AE) Referring Phys:  [id number] Weston Anna RAO Diagnosing Phys: Ida Rogue MD  Sonographer Comments: Global longitudinal strain was attempted. IMPRESSIONS  1. Left ventricular ejection fraction, by estimation, is 60 to 65%. The left ventricle has normal function. The left ventricle has no regional wall motion abnormalities. Left ventricular diastolic parameters are consistent with Grade I diastolic dysfunction (impaired relaxation). The average left ventricular global longitudinal strain is -14.5 %.  2. Right ventricular systolic function is normal. The right ventricular size is normal. Tricuspid regurgitation signal is inadequate for assessing PA pressure.  3. The mitral valve is normal in structure. Mild mitral valve regurgitation. FINDINGS  Left Ventricle: Left ventricular ejection fraction, by estimation, is 60 to 65%. The left ventricle has normal function. The left ventricle has no regional wall motion abnormalities. The average left ventricular global longitudinal strain is -14.5 %. The left ventricular internal cavity size was normal in size. There is no left ventricular hypertrophy. Left ventricular diastolic parameters are consistent with Grade I diastolic dysfunction (impaired relaxation). Right Ventricle: The right ventricular size is normal. No increase in right ventricular wall thickness. Right ventricular systolic function is normal. Tricuspid  regurgitation signal is inadequate for assessing PA pressure. Left Atrium: Left atrial size was normal in size. Right Atrium: Right atrial size was normal in size. Pericardium: There is no evidence of pericardial effusion. Mitral Valve: The mitral valve is normal in structure. Mild mitral valve regurgitation. No evidence of mitral valve stenosis. MV peak gradient, 4.9 mmHg. The mean mitral valve gradient is 3.0 mmHg. Tricuspid Valve: The tricuspid valve is normal in structure. Tricuspid valve regurgitation is mild . No evidence of tricuspid stenosis. Aortic Valve: The aortic valve is normal in structure. Aortic valve regurgitation is not visualized. No aortic stenosis is present. Aortic valve mean gradient measures 3.0 mmHg. Aortic valve peak gradient measures 6.6 mmHg. Aortic valve area, by VTI measures 3.91 cm. Pulmonic Valve: The pulmonic valve was normal in structure. Pulmonic valve regurgitation is not visualized. No evidence of pulmonic stenosis. Aorta: The aortic root is normal in size and structure. Venous: The inferior vena cava is normal in size with greater than 50% respiratory variability, suggesting right atrial pressure of 3 mmHg. IAS/Shunts: No atrial level shunt detected by color flow Doppler.  LEFT VENTRICLE PLAX 2D LVIDd:         4.80 cm  Diastology LVIDs:         3.30 cm  LV e' medial:    7.51 cm/s LV PW:         1.00 cm  LV E/e' medial:  10.7 LV IVS:        1.00 cm  LV e' lateral:  8.70 cm/s LVOT diam:     2.20 cm  LV E/e' lateral: 9.3 LV SV:         84 LV SV Index:   40       2D Longitudinal Strain LVOT Area:     3.80 cm 2D Strain GLS (A2C):   -15.4 %                         2D Strain GLS (A3C):   -14.1 %                         2D Strain GLS (A4C):   -14.1 %                         2D Strain GLS Avg:     -14.5 % RIGHT VENTRICLE RV Basal diam:  3.60 cm TAPSE (M-mode): 3.1 cm LEFT ATRIUM             Index       RIGHT ATRIUM           Index LA diam:        3.00 cm 1.44 cm/m  RA Area:     15.60  cm LA Vol (A2C):   47.7 ml 22.92 ml/m RA Volume:   35.90 ml  17.25 ml/m LA Vol (A4C):   52.2 ml 25.08 ml/m LA Biplane Vol: 51.0 ml 24.50 ml/m  AORTIC VALVE                   PULMONIC VALVE AV Area (Vmax):    3.24 cm    PV Vmax:       1.00 m/s AV Area (Vmean):   3.36 cm    PV Vmean:      62.700 cm/s AV Area (VTI):     3.91 cm    PV VTI:        0.164 m AV Vmax:           128.00 cm/s PV Peak grad:  4.0 mmHg AV Vmean:          87.000 cm/s PV Mean grad:  2.0 mmHg AV VTI:            0.214 m AV Peak Grad:      6.6 mmHg AV Mean Grad:      3.0 mmHg LVOT Vmax:         109.00 cm/s LVOT Vmean:        77.000 cm/s LVOT VTI:          0.220 m LVOT/AV VTI ratio: 1.03  AORTA Ao Root diam: 3.60 cm MITRAL VALVE MV Area (PHT): 5.31 cm    SHUNTS MV Peak grad:  4.9 mmHg    Systemic VTI:  0.22 m MV Mean grad:  3.0 mmHg    Systemic Diam: 2.20 cm MV Vmax:       1.11 m/s MV Vmean:      75.9 cm/s MV Decel Time: 143 msec MV E velocity: 80.60 cm/s MV A velocity: 87.40 cm/s MV E/A ratio:  0.92 Ida Rogue MD Electronically signed by Ida Rogue MD Signature Date/Time: 07/17/2020/1:00:41 PM    Final      Assessment and plan- Patient is a 65 y.o. male  with newly diagnosed stage IV esophageal adenocarcinoma of the GE junction with liver metastases.  He is here for on treatment assessment prior to cycle 3  of FOLFOX chemotherapy with trastuzumab  Counts okay to proceed with trastuzumab FOLFOX chemotherapy today.  Pump DC on day 3.  He completes radiation treatment next week.  I will plan to add Keytruda with cycle 4 given that he has HER-2 positive adenocarcinoma in which immunotherapy has shown improved outcomes regardless of CPS score.  Neoplasm related pain: Currently resolved after starting chemotherapy   Visit Diagnosis 1. Encounter for antineoplastic chemotherapy   2. Esophageal adenocarcinoma ([place])      Dr. Randa Evens, MD, MPH [facility] at [facility] [phone] 08/01/2020 1:26 PM

## 2020-08-02 ENCOUNTER — Ambulatory Visit
Admission: RE | Admit: 2020-08-02 | Discharge: 2020-08-02 | Disposition: A | Discharge status: Home / Self Care | Payer: Medicare Other | Source: Ambulatory Visit | Attending: Radiation Oncology | Admitting: Radiation Oncology

## 2020-08-02 DIAGNOSIS — C155 Malignant neoplasm of lower third of esophagus: Secondary | ICD-10-CM | POA: Diagnosis not present

## 2020-08-03 ENCOUNTER — Ambulatory Visit
Admission: RE | Admit: 2020-08-03 | Discharge: 2020-08-03 | Disposition: A | Discharge status: Home / Self Care | Payer: Medicare Other | Source: Ambulatory Visit | Attending: Radiation Oncology | Admitting: Radiation Oncology

## 2020-08-03 ENCOUNTER — Other Ambulatory Visit: Payer: Self-pay

## 2020-08-03 ENCOUNTER — Inpatient Hospital Stay: Payer: Medicare Other

## 2020-08-03 VITALS — BP 109/71 | HR 69 | Temp 98.0°F | Resp 18

## 2020-08-03 DIAGNOSIS — Z5112 Encounter for antineoplastic immunotherapy: Secondary | ICD-10-CM | POA: Diagnosis not present

## 2020-08-03 DIAGNOSIS — C159 Malignant neoplasm of esophagus, unspecified: Secondary | ICD-10-CM

## 2020-08-03 DIAGNOSIS — C155 Malignant neoplasm of lower third of esophagus: Secondary | ICD-10-CM | POA: Diagnosis not present

## 2020-08-03 MED ORDER — PEGFILGRASTIM-CBQV 6 MG/0.6ML ~~LOC~~ SOSY
6.0000 mg | PREFILLED_SYRINGE | Freq: Once | SUBCUTANEOUS | Status: DC
  Filled 2020-08-03: qty 0.6

## 2020-08-03 MED ORDER — HEPARIN SOD (PORK) LOCK FLUSH 100 UNIT/ML IV SOLN
500.0000 [IU] | Freq: Once | INTRAVENOUS | Status: AC | PRN
  Administered 2020-08-03: 500 [IU]
  Filled 2020-08-03: qty 5

## 2020-08-03 MED ORDER — SODIUM CHLORIDE 0.9% FLUSH
10.0000 mL | INTRAVENOUS | Status: DC | PRN
  Administered 2020-08-03: 10 mL
  Filled 2020-08-03: qty 10

## 2020-08-03 MED ORDER — HEPARIN SOD (PORK) LOCK FLUSH 100 UNIT/ML IV SOLN
INTRAVENOUS | Status: AC
  Filled 2020-08-03: qty 5

## 2020-08-03 NOTE — Progress Notes (Signed)
1328- Per MD, Dr. [name], order: Ellen Henri injection is not due today; do not give patient Udenyca injection today; discontinue order.

## 2020-08-04 ENCOUNTER — Ambulatory Visit
Admission: RE | Admit: 2020-08-04 | Discharge: 2020-08-04 | Disposition: A | Discharge status: Home / Self Care | Payer: Medicare Other | Source: Ambulatory Visit | Attending: Radiation Oncology | Admitting: Radiation Oncology

## 2020-08-04 DIAGNOSIS — C155 Malignant neoplasm of lower third of esophagus: Secondary | ICD-10-CM | POA: Diagnosis not present

## 2020-08-07 ENCOUNTER — Ambulatory Visit
Admission: RE | Admit: 2020-08-07 | Discharge: 2020-08-07 | Disposition: A | Discharge status: Home / Self Care | Payer: Medicare Other | Source: Ambulatory Visit | Attending: Radiation Oncology | Admitting: Radiation Oncology

## 2020-08-07 DIAGNOSIS — C155 Malignant neoplasm of lower third of esophagus: Secondary | ICD-10-CM | POA: Diagnosis not present

## 2020-08-08 ENCOUNTER — Ambulatory Visit
Admission: RE | Admit: 2020-08-08 | Discharge: 2020-08-08 | Disposition: A | Discharge status: Home / Self Care | Payer: Medicare Other | Source: Ambulatory Visit | Attending: Radiation Oncology | Admitting: Radiation Oncology

## 2020-08-08 DIAGNOSIS — C155 Malignant neoplasm of lower third of esophagus: Secondary | ICD-10-CM | POA: Insufficient documentation

## 2020-08-08 DIAGNOSIS — C787 Secondary malignant neoplasm of liver and intrahepatic bile duct: Secondary | ICD-10-CM | POA: Insufficient documentation

## 2020-08-09 ENCOUNTER — Ambulatory Visit
Admission: RE | Admit: 2020-08-09 | Discharge: 2020-08-09 | Disposition: A | Discharge status: Home / Self Care | Payer: Medicare Other | Source: Ambulatory Visit | Attending: Radiation Oncology | Admitting: Radiation Oncology

## 2020-08-09 DIAGNOSIS — C155 Malignant neoplasm of lower third of esophagus: Secondary | ICD-10-CM | POA: Diagnosis not present

## 2020-08-10 ENCOUNTER — Ambulatory Visit
Admission: RE | Admit: 2020-08-10 | Discharge: 2020-08-10 | Disposition: A | Discharge status: Home / Self Care | Payer: Medicare Other | Source: Ambulatory Visit | Attending: Radiation Oncology | Admitting: Radiation Oncology

## 2020-08-10 DIAGNOSIS — C155 Malignant neoplasm of lower third of esophagus: Secondary | ICD-10-CM | POA: Diagnosis not present

## 2020-08-11 ENCOUNTER — Ambulatory Visit
Admission: RE | Admit: 2020-08-11 | Discharge: 2020-08-11 | Disposition: A | Discharge status: Home / Self Care | Payer: Medicare Other | Source: Ambulatory Visit | Attending: Radiation Oncology | Admitting: Radiation Oncology

## 2020-08-11 DIAGNOSIS — C155 Malignant neoplasm of lower third of esophagus: Secondary | ICD-10-CM | POA: Diagnosis not present

## 2020-08-14 ENCOUNTER — Other Ambulatory Visit: Payer: Self-pay | Admitting: *Deleted

## 2020-08-14 DIAGNOSIS — C159 Malignant neoplasm of esophagus, unspecified: Secondary | ICD-10-CM

## 2020-08-15 ENCOUNTER — Inpatient Hospital Stay: Payer: Medicare Other | Attending: Oncology

## 2020-08-15 ENCOUNTER — Encounter: Payer: Self-pay | Admitting: Oncology

## 2020-08-15 ENCOUNTER — Inpatient Hospital Stay (HOSPITAL_BASED_OUTPATIENT_CLINIC_OR_DEPARTMENT_OTHER): Payer: Medicare Other | Admitting: Oncology

## 2020-08-15 ENCOUNTER — Inpatient Hospital Stay: Payer: Medicare Other

## 2020-08-15 VITALS — BP 146/94 | HR 80 | Temp 97.8°F | Resp 16 | Ht 71.0 in | Wt 184.2 lb

## 2020-08-15 DIAGNOSIS — Z808 Family history of malignant neoplasm of other organs or systems: Secondary | ICD-10-CM | POA: Diagnosis not present

## 2020-08-15 DIAGNOSIS — Z923 Personal history of irradiation: Secondary | ICD-10-CM | POA: Insufficient documentation

## 2020-08-15 DIAGNOSIS — Z5111 Encounter for antineoplastic chemotherapy: Secondary | ICD-10-CM

## 2020-08-15 DIAGNOSIS — R1310 Dysphagia, unspecified: Secondary | ICD-10-CM | POA: Insufficient documentation

## 2020-08-15 DIAGNOSIS — Z5112 Encounter for antineoplastic immunotherapy: Secondary | ICD-10-CM | POA: Diagnosis present

## 2020-08-15 DIAGNOSIS — Z800 Family history of malignant neoplasm of digestive organs: Secondary | ICD-10-CM | POA: Insufficient documentation

## 2020-08-15 DIAGNOSIS — C159 Malignant neoplasm of esophagus, unspecified: Secondary | ICD-10-CM | POA: Diagnosis not present

## 2020-08-15 DIAGNOSIS — Z8042 Family history of malignant neoplasm of prostate: Secondary | ICD-10-CM | POA: Diagnosis not present

## 2020-08-15 DIAGNOSIS — Z8249 Family history of ischemic heart disease and other diseases of the circulatory system: Secondary | ICD-10-CM | POA: Diagnosis not present

## 2020-08-15 DIAGNOSIS — G893 Neoplasm related pain (acute) (chronic): Secondary | ICD-10-CM | POA: Insufficient documentation

## 2020-08-15 DIAGNOSIS — Z79899 Other long term (current) drug therapy: Secondary | ICD-10-CM | POA: Insufficient documentation

## 2020-08-15 DIAGNOSIS — K219 Gastro-esophageal reflux disease without esophagitis: Secondary | ICD-10-CM | POA: Diagnosis not present

## 2020-08-15 DIAGNOSIS — Z87891 Personal history of nicotine dependence: Secondary | ICD-10-CM | POA: Insufficient documentation

## 2020-08-15 DIAGNOSIS — Z5189 Encounter for other specified aftercare: Secondary | ICD-10-CM | POA: Diagnosis not present

## 2020-08-15 DIAGNOSIS — C160 Malignant neoplasm of cardia: Secondary | ICD-10-CM | POA: Diagnosis present

## 2020-08-15 DIAGNOSIS — C787 Secondary malignant neoplasm of liver and intrahepatic bile duct: Secondary | ICD-10-CM | POA: Insufficient documentation

## 2020-08-15 DIAGNOSIS — Z809 Family history of malignant neoplasm, unspecified: Secondary | ICD-10-CM | POA: Diagnosis not present

## 2020-08-15 DIAGNOSIS — Z833 Family history of diabetes mellitus: Secondary | ICD-10-CM | POA: Diagnosis not present

## 2020-08-15 LAB — COMPREHENSIVE METABOLIC PANEL
ALT: 23 U/L (ref 0–44)
AST: 24 U/L (ref 15–41)
Albumin: 3.5 g/dL (ref 3.5–5.0)
Alkaline Phosphatase: 97 U/L (ref 38–126)
Anion gap: 9 (ref 5–15)
BUN: 16 mg/dL (ref 8–23)
CO2: 26 mmol/L (ref 22–32)
Calcium: 8.8 mg/dL — ABNORMAL LOW (ref 8.9–10.3)
Chloride: 102 mmol/L (ref 98–111)
Creatinine, Ser: 0.79 mg/dL (ref 0.61–1.24)
GFR, Estimated: >60 mL/min (ref >60)
Glucose, Bld: 121 mg/dL — ABNORMAL HIGH (ref 70–99)
Potassium: 4 mmol/L (ref 3.5–5.1)
Sodium: 137 mmol/L (ref 135–145)
Total Bilirubin: 0.8 mg/dL (ref 0.3–1.2)
Total Protein: 6.9 g/dL (ref 6.5–8.1)

## 2020-08-15 LAB — CBC WITH DIFFERENTIAL/PLATELET
Abs Immature Granulocytes: 0.01 10*3/uL (ref 0.00–0.07)
Basophils Absolute: 0 10*3/uL (ref 0.0–0.1)
Basophils Relative: 1 %
Eosinophils Absolute: 0.1 10*3/uL (ref 0.0–0.5)
Eosinophils Relative: 4 %
HCT: 36 % — ABNORMAL LOW (ref 39.0–52.0)
Hemoglobin: 11.4 g/dL — ABNORMAL LOW (ref 13.0–17.0)
Immature Granulocytes: 0 %
Lymphocytes Relative: 13 %
Lymphs Abs: 0.5 10*3/uL — ABNORMAL LOW (ref 0.7–4.0)
MCH: 26.6 pg (ref 26.0–34.0)
MCHC: 31.7 g/dL (ref 30.0–36.0)
MCV: 84.1 fL (ref 80.0–100.0)
Monocytes Absolute: 0.5 10*3/uL (ref 0.1–1.0)
Monocytes Relative: 12 %
Neutro Abs: 2.8 10*3/uL (ref 1.7–7.7)
Neutrophils Relative %: 70 %
Platelets: 112 10*3/uL — ABNORMAL LOW (ref 150–400)
RBC: 4.28 MIL/uL (ref 4.22–5.81)
RDW: 18.8 % — ABNORMAL HIGH (ref 11.5–15.5)
WBC: 4 10*3/uL (ref 4.0–10.5)
nRBC: 0 % (ref 0.0–0.2)

## 2020-08-15 LAB — TSH: TSH: 2.018 u[IU]/mL (ref 0.350–4.500)

## 2020-08-15 MED ORDER — TRASTUZUMAB-ANNS CHEMO 150 MG IV SOLR
336.0000 mg | Freq: Once | INTRAVENOUS | Status: AC
  Administered 2020-08-15: 336 mg via INTRAVENOUS
  Filled 2020-08-15: qty 16

## 2020-08-15 MED ORDER — ACETAMINOPHEN 325 MG PO TABS
650.0000 mg | ORAL_TABLET | Freq: Once | ORAL | Status: AC
Start: 2020-08-15 — End: 2020-08-15
  Administered 2020-08-15: 650 mg via ORAL
  Filled 2020-08-15: qty 2

## 2020-08-15 MED ORDER — FLUOROURACIL CHEMO INJECTION 2.5 GM/50ML
400.0000 mg/m2 | Freq: Once | INTRAVENOUS | Status: AC
  Administered 2020-08-15: 850 mg via INTRAVENOUS
  Filled 2020-08-15: qty 17

## 2020-08-15 MED ORDER — SUCRALFATE 1 G PO TABS: 1.0000 g | ORAL_TABLET | Freq: Two times a day (BID) | ORAL | 1 refills | Status: DC

## 2020-08-15 MED ORDER — DEXTROSE 5 % IV SOLN
Freq: Once | INTRAVENOUS | Status: AC
  Filled 2020-08-15: qty 250

## 2020-08-15 MED ORDER — PALONOSETRON HCL INJECTION 0.25 MG/5ML
0.2500 mg | Freq: Once | INTRAVENOUS | Status: AC
  Administered 2020-08-15: 0.25 mg via INTRAVENOUS
  Filled 2020-08-15: qty 5

## 2020-08-15 MED ORDER — SODIUM CHLORIDE 0.9 % IV SOLN
2400.0000 mg/m2 | INTRAVENOUS | Status: DC
  Administered 2020-08-15: 5100 mg via INTRAVENOUS
  Filled 2020-08-15: qty 102

## 2020-08-15 MED ORDER — SODIUM CHLORIDE 0.9 % IV SOLN
Freq: Once | INTRAVENOUS | Status: AC
  Filled 2020-08-15: qty 250

## 2020-08-15 MED ORDER — OXALIPLATIN CHEMO INJECTION 100 MG/20ML
65.0000 mg/m2 | Freq: Once | INTRAVENOUS | Status: AC
  Administered 2020-08-15: 140 mg via INTRAVENOUS
  Filled 2020-08-15: qty 20

## 2020-08-15 MED ORDER — LEUCOVORIN CALCIUM INJECTION 350 MG
850.0000 mg | Freq: Once | INTRAVENOUS | Status: AC
  Administered 2020-08-15: 850 mg via INTRAVENOUS
  Filled 2020-08-15: qty 42.5

## 2020-08-15 MED ORDER — SODIUM CHLORIDE 0.9 % IV SOLN
400.0000 mg | Freq: Once | INTRAVENOUS | Status: AC
  Administered 2020-08-15: 400 mg via INTRAVENOUS
  Filled 2020-08-15: qty 16

## 2020-08-15 MED ORDER — SODIUM CHLORIDE 0.9% FLUSH
10.0000 mL | INTRAVENOUS | Status: DC | PRN
  Administered 2020-08-15: 10 mL
  Filled 2020-08-15: qty 10

## 2020-08-15 MED ORDER — DIPHENHYDRAMINE HCL 50 MG/ML IJ SOLN
50.0000 mg | Freq: Once | INTRAMUSCULAR | Status: AC
  Administered 2020-08-15: 50 mg via INTRAVENOUS
  Filled 2020-08-15: qty 1

## 2020-08-15 MED ORDER — SODIUM CHLORIDE 0.9 % IV SOLN
10.0000 mg | Freq: Once | INTRAVENOUS | Status: AC
  Administered 2020-08-15: 10 mg via INTRAVENOUS
  Filled 2020-08-15: qty 10

## 2020-08-15 NOTE — Progress Notes (Signed)
Pt having pain in esophagus especially in am. He says it is not nausea. It started on tues. Of last week and told Dr. Baruch Gouty. He did not rec: anything. After few hours then he is able to get things down. He eats and drinks and take 3 boost in on top of the other foods and liq. Mostly water and did drink a cola the other day. BM good

## 2020-08-15 NOTE — Progress Notes (Signed)
1520- Patient tolerated treatment well. Patient stable and discharged to home with Fluorouracil Continuous Infusion Pump in place.

## 2020-08-16 LAB — CEA: CEA: 37.8 ng/mL — ABNORMAL HIGH (ref 0.0–4.7)

## 2020-08-17 ENCOUNTER — Encounter: Payer: Self-pay | Admitting: Licensed Clinical Social Worker

## 2020-08-17 ENCOUNTER — Inpatient Hospital Stay (HOSPITAL_BASED_OUTPATIENT_CLINIC_OR_DEPARTMENT_OTHER): Payer: Medicare Other | Admitting: Licensed Clinical Social Worker

## 2020-08-17 ENCOUNTER — Inpatient Hospital Stay: Payer: Medicare Other

## 2020-08-17 VITALS — BP 152/73 | HR 83 | Temp 97.0°F | Resp 17

## 2020-08-17 DIAGNOSIS — C159 Malignant neoplasm of esophagus, unspecified: Secondary | ICD-10-CM | POA: Diagnosis not present

## 2020-08-17 DIAGNOSIS — Z808 Family history of malignant neoplasm of other organs or systems: Secondary | ICD-10-CM

## 2020-08-17 DIAGNOSIS — Z5112 Encounter for antineoplastic immunotherapy: Secondary | ICD-10-CM | POA: Diagnosis not present

## 2020-08-17 DIAGNOSIS — Z800 Family history of malignant neoplasm of digestive organs: Secondary | ICD-10-CM | POA: Diagnosis not present

## 2020-08-17 DIAGNOSIS — Z86010 Personal history of colonic polyps: Secondary | ICD-10-CM

## 2020-08-17 MED ORDER — HEPARIN SOD (PORK) LOCK FLUSH 100 UNIT/ML IV SOLN
INTRAVENOUS | Status: AC
  Filled 2020-08-17: qty 5

## 2020-08-17 MED ORDER — PEGFILGRASTIM-CBQV 6 MG/0.6ML ~~LOC~~ SOSY
6.0000 mg | PREFILLED_SYRINGE | Freq: Once | SUBCUTANEOUS | Status: AC
  Administered 2020-08-17: 6 mg via SUBCUTANEOUS
  Filled 2020-08-17: qty 0.6

## 2020-08-17 MED ORDER — HEPARIN SOD (PORK) LOCK FLUSH 100 UNIT/ML IV SOLN
500.0000 [IU] | Freq: Once | INTRAVENOUS | Status: AC | PRN
  Administered 2020-08-17: 500 [IU]
  Filled 2020-08-17: qty 5

## 2020-08-17 MED ORDER — SODIUM CHLORIDE 0.9% FLUSH
10.0000 mL | INTRAVENOUS | Status: DC | PRN
Start: 2020-08-17 — End: 2020-08-17
  Administered 2020-08-17: 10 mL
  Filled 2020-08-17: qty 10

## 2020-08-17 NOTE — Progress Notes (Signed)
REFERRING PROVIDER: [name], MD [place],  [place] [address]  PRIMARY PROVIDER:  Venia Carbon, MD  PRIMARY REASON FOR VISIT:  1. COLONIC POLYPS, HX OF   2. Family history of colon cancer   3. Family history of brain cancer   4. Family history of stomach cancer   5. Family history of melanoma   6. 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 male, was seen for a [place] cancer genetics consultation at the request of Dr. [name] due to a personal and family history of cancer.  Cory [name] presents to clinic today to discuss the possibility of a hereditary predisposition to cancer, genetic testing, and to further clarify his future cancer risks, as well as potential cancer risks for family members.   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 This is being treated with chemotherapy.  He reports having more than 10 colon polyps; his 2021 colonoscopy is the only one available for review that does show 7 total polyps.   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 Family history of brain cancer   . Family history of colon cancer   . Family history of melanoma   . Family history  of stomach cancer   . GERD (gastroesophageal reflux disease)   . History of kidney stones   . Nephrolithiasis    [facility]  . Pancreatitis, acute   . Personal history of colonic polyps    Dr Nicolasa Ducking  . Psoriasis     Past Surgical History:  Procedure Laterality Date  . COLONOSCOPY    . COLONOSCOPY WITH PROPOFOL N/A 03/31/2020   Procedure: COLONOSCOPY WITH PROPOFOL;  Surgeon: Jonathon Bellows, MD;  Location: [facility] ENDOSCOPY;  Service: Gastroenterology;  Laterality: N/A;  . ERCP    . ESOPHAGOGASTRODUODENOSCOPY (EGD) WITH PROPOFOL N/A 06/19/2020   Procedure: ESOPHAGOGASTRODUODENOSCOPY (EGD) WITH PROPOFOL;  Surgeon: Jonathon Bellows, MD;  Location[facility] ENDOSCOPY;  Service: Gastroenterology;  Laterality: N/A;  . groin surgery     as a child  . PORTA CATH INSERTION N/A 06/28/2020   Procedure: PORTA CATH INSERTION;  Surgeon: Algernon Huxley, MD;  Location: [place] CV LAB;  Service: Cardiovascular;  Laterality: N/A;  . SHOULDER SURGERY      Social History   Socioeconomic History  . Marital status: Married    Spouse name: Not on file  . Number of children: 2  . Years of education: Not on file  . Highest education level: Not on file  Occupational History  . 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 Smoking status: Former Smoker  Types: Cigarettes    Quit date: 07/08/1988    Years since quitting: 32.1  . Smokeless tobacco: Never Used  Vaping Use  . Vaping Use: Never used  Substance and Sexual Activity  . Alcohol use: No    Comment: recovering alcoholic for 13 years  . Drug use: No  . Sexual activity: Not Currently  Other Topics Concern  . Not on file  Social History Narrative   ** Merged History Encounter **       Social Determinants of Health   Financial Resource Strain: Not on file  Food Insecurity: Not on file  Transportation Needs: Not on file  Physical Activity: Not on file  Stress: Not on file  Social Connections: Not on file      FAMILY HISTORY:  We obtained a detailed, 4-generation family history.  Significant diagnoses are listed below: Family History  Problem Relation Age of Onset  . Stomach cancer Mother   . Diabetes Brother   . Hypertension Brother   . Hypertension Father   . Prostate cancer Father   . Colon cancer Sister   . Cancer Other        either cervical or ovarian, d. 61s  . Brain cancer Sister    Cory [name] has 2 daughters and 1 son. One of his daughters has had melanoma at 85, and a possible second melanoma she is currently being worked up for. Patient has a full brother and sister, no cancers. His brother has a daughter who died of either ovarian or cervical cancer in her 70s. Patient also has 2 paternal half sisters. One had colon cancer in her 60s-70s and passed away, and the other had brain cancer in her 41s.   Mr. Knee mother had stomach cancer at 70 and died at 74. No other known cancers on this side of the family.  Mr. Merry father had prostate cancer in his 4s and died of another cause at 29. No other known cancers on this side of the family.   Mr. Caspers is unaware of previous family history of genetic testing for hereditary cancer risks. Patient's maternal ancestors are of unknown descent, and paternal ancestors are of unknown descent. There is no reported Ashkenazi Jewish ancestry. There is no known consanguinity.    GENETIC COUNSELING ASSESSMENT: Cory [name] is a 65 y.o. male with a family history which is somewhat suggestive of a hereditary cancer syndrome and predisposition to cancer. We, therefore, discussed and recommended the following at today's visit.   DISCUSSION: We discussed that approximately 5-10% of  cancer is hereditary. While esophageal cancer is typically not hereditary, some of the other cancers seen in his family can be. We discussed Lynch syndrome in particular.   We discussed that testing is beneficial for several reasons including knowing about other cancer  risks, identifying potential screening and risk-reduction options that may be appropriate, and to understand if other family members could be at risk for cancer and allow them to undergo genetic testing.   We reviewed the characteristics, features and inheritance patterns of hereditary cancer syndromes. We also discussed genetic testing, including the appropriate family members to test, the process of testing, insurance coverage and turn-around-time for results. We discussed the implications of a negative, positive and/or variant of uncertain significant result. We recommended Cory [name] pursue genetic testing for the [facility] Multi-Cancer gene panel.   The Multi-Cancer Panel + RNA offered by [facility] includes sequencing and/or deletion duplication testing of the following 84 genes: AIP, ALK, APC,  ATM, AXIN2,BAP1,  BARD1, BLM, BMPR1A, BRCA1, BRCA2, BRIP1, CASR, CDC73, CDH1, CDK4, CDKN1B, CDKN1C, CDKN2A (p14ARF), CDKN2A (p16INK4a), CEBPA, CHEK2, CTNNA1, DICER1, DIS3L2, EGFR (c.2369C>T, p.Thr790Met variant only), EPCAM (Deletion/duplication testing only), FH, FLCN, GATA2, GPC3, GREM1 (Promoter region deletion/duplication testing only), HOXB13 (c.251G>A, p.Gly84Glu), HRAS, KIT, MAX, MEN1, MET, MITF (c.952G>A, p.Glu318Lys variant only), MLH1, MSH2, MSH3, MSH6, MUTYH, NBN, NF1, NF2, NTHL1, PALB2, PDGFRA, PHOX2B, PMS2, POLD1, POLE, POT1, PRKAR1A, PTCH1, PTEN, RAD50, RAD51C, RAD51D, RB1, RECQL4, RET,  RUNX1, SDHAF2, SDHA (sequence changes only), SDHB, SDHC, SDHD, SMAD4, SMARCA4, SMARCB1, SMARCE1, STK11, SUFU, TERC, TERT, TMEM127, TP53, TSC1, TSC2, VHL, WRN and WT1.   Based on Cory [name]'s family history of cancer and personal history of colon polyps, he meets medical criteria for genetic testing. Despite that he meets criteria, he may still have an out of pocket cost.   PLAN: After considering the risks, benefits, and limitations, Cory [name] provided informed consent to pursue genetic testing and the blood sample was  sent to [facility] for analysis of the Multi-Cancer +RNA Panel. Results should be available within approximately 2-3 weeks' time, at which point they will be disclosed by telephone to Cory [name], as will any additional recommendations warranted by these results. Cory [name] will receive a summary of his genetic counseling visit and a copy of his results once available. This information will also be available in Epic.   Cory [name] questions were answered to his satisfaction today. Our contact information was provided should additional questions or concerns arise. Thank you for the referral and allowing [place] to share in the care of your patient.   Faith Rogue, MS, [facility] Genetic Counselor [email or web address] Phone: [phone]  The patient was seen for a total of 30 minutes in face-to-face genetic counseling. His son, Cory [name], was present in person and daughter Cory [name] present over the phone.  Dr. Grayland Ormond was available for discussion regarding this case.   _______________________________________________________________________ For Office Staff:  Number of people involved in session: 3 Was an Intern/ student involved with case: no

## 2020-08-20 NOTE — Progress Notes (Signed)
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 Vicente Males which showed a large nearly obstructing mass in the lower third of the esophagus 35 cm from incisors. This was biopsied and was consistent with adenocarcinoma.HER-2 positive. PD-L1/CPS/NGS pending.   Presently patient reports dysphagia but is able to eat cereal or soft food. He has lost about 13 pounds in the last month itself. Denies any significant pain. He recently retired after many years of service and is otherwise independent of his ADLs and IADLs.  PET CT scan on 06/26/2020 showed circumferential distal esophageal mass with an SUV of 13.3 extending over 5.5 cm. Right lower paratracheal node is 0.6 cm with an SUV of 3.1. Hypermetabolic right gastric lymph nodes including 1.1 cm node with an SUV of 5.8. Numerous hypermetabolic lesions throughout the liver somewhat confluent around the periphery which were hypermetabolic between 8 and 9. Faintly hypermetabolic left adrenal gland  NGS testing showed high tumor mutational burden CPS score less than 1.  ERBB2 gain but no other actionable mutations  Interval history-patient reports that for the last couple of days he has been having some problems with swallowing.  Today morning he was not able to eat or drink much and felt like  throwing up.  He still tries to drink 3-4 ensures daily.  Denies any significant nausea or vomiting with chemotherapy  ECOG PS- 1 Pain scale- 0 Opioid associated constipation- no  Review of systems- Review of Systems  Constitutional: Negative for chills, fever, malaise/fatigue and weight loss.  HENT: Negative for congestion, ear discharge and nosebleeds.   Eyes: Negative for blurred vision.  Respiratory: Negative for cough, hemoptysis, sputum production, shortness of breath and wheezing.   Cardiovascular: Negative for chest pain, palpitations, orthopnea and claudication.  Gastrointestinal: Negative for abdominal pain, blood in stool, constipation, diarrhea, heartburn, melena, nausea and vomiting.       Difficulty swallowing  Genitourinary: Negative for dysuria, flank pain, frequency, hematuria and urgency.  Musculoskeletal: Negative for back pain, joint pain and myalgias.  Skin: Negative for rash.  Neurological: Negative for dizziness, tingling, focal weakness, seizures, weakness and headaches.  Endo/Heme/Allergies: Does not bruise/bleed easily.  Psychiatric/Behavioral: Negative for depression and suicidal ideas. The patient does not have insomnia.       No Known Allergies   Past Medical History:  Diagnosis Date  . 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 Family history of brain cancer   . Family history of colon cancer   . Family history of melanoma   . Family history of stomach cancer   . GERD (gastroesophageal reflux disease)   . History of kidney stones   . Nephrolithiasis    [facility]  . Pancreatitis, acute   . Personal history of colonic polyps    Dr Nicolasa Ducking  . Psoriasis      Past Surgical History:  Procedure Laterality Date  . COLONOSCOPY    . COLONOSCOPY WITH PROPOFOL  N/A 03/31/2020   Procedure: COLONOSCOPY WITH PROPOFOL;  Surgeon: Jonathon Bellows, MD;  Location: [facility] ENDOSCOPY;  Service: Gastroenterology;  Laterality: N/A;  . ERCP    .  ESOPHAGOGASTRODUODENOSCOPY (EGD) WITH PROPOFOL N/A 06/19/2020   Procedure: ESOPHAGOGASTRODUODENOSCOPY (EGD) WITH PROPOFOL;  Surgeon: Jonathon Bellows, MD;  Location: [facility] ENDOSCOPY;  Service: Gastroenterology;  Laterality: N/A;  . groin surgery     as a child  . PORTA CATH INSERTION N/A 06/28/2020   Procedure: PORTA CATH INSERTION;  Surgeon: Algernon Huxley, MD;  Location: [place] CV LAB;  Service: Cardiovascular;  Laterality: N/A;  . SHOULDER SURGERY      Social History   Socioeconomic History  . Marital status: Married    Spouse name: Not on file  . Number of children: 2  . Years of education: Not on file  . Highest education level: Not on file  Occupational History  . 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 Smoking status: Former Smoker    Types: Cigarettes    Quit date: 07/08/1988    Years since quitting: 32.1  . Smokeless tobacco: Never Used  Vaping Use  . Vaping Use: Never used  Substance and Sexual Activity  . Alcohol use: No    Comment: recovering alcoholic for 13 years  . Drug use: No  . Sexual activity: Not Currently  Other Topics Concern  . Not on file  Social History Narrative   ** Merged History Encounter **       Social Determinants of Health   Financial Resource Strain: Not on file  Food Insecurity: Not on file  Transportation Needs: Not on file  Physical Activity: Not on file  Stress: Not on file  Social Connections: Not on file  Intimate Partner Violence: Not on file    Family History  Problem Relation Age of Onset  . Stomach cancer Mother   . Diabetes Brother   . Hypertension Brother   . Hypertension Father   . Prostate cancer Father   . Colon cancer Sister   . Cancer Other        either cervical or ovarian, d. 85s  . Brain cancer Sister      Current Outpatient Medications:  .  dexamethasone (DECADRON) 4 MG tablet, Take 2 tablets (8 mg total) by mouth daily. Start the day after chemotherapy for 2  days. Take with food., Disp: 30 tablet, Rfl: 1 .  lidocaine-prilocaine (EMLA) cream, Apply 1 application topically as needed. Apply small amount to port site at least 1 hour prior to it being accessed, cover with plastic wrap, Disp: 30 g, Rfl: 3 .  oxyCODONE (OXY IR/ROXICODONE) 5 MG immediate release tablet, Take 1 tablet (5 mg total) by mouth every 8 (eight) hours as needed for severe pain., Disp: 60 tablet, Rfl: 0 .  pantoprazole (PROTONIX) 40 MG tablet, Take 1 tablet (40 mg total) by mouth 2 (two) times daily before a meal., Disp: 60 tablet, Rfl: 3 .  sucralfate (CARAFATE) 1 g tablet, Take 1 tablet (1 g total) by mouth 2 (two) times daily., Disp: 180 tablet, Rfl: 1 .  LORazepam (ATIVAN) 0.5 MG tablet, Take 1 tablet (0.5 mg total) by mouth every 6 (six) hours as needed (Nausea or vomiting). (Patient not taking: No sig reported), Disp: 30 tablet, Rfl: 0 .  ondansetron (ZOFRAN) 8 MG tablet, Take 1 tablet (8 mg total) by mouth 2 (two) times daily as needed for refractory nausea / vomiting.  Start on day 3 after chemotherapy. (Patient not taking: No sig reported), Disp: 30 tablet, Rfl: 1 .  prochlorperazine (COMPAZINE) 10 MG tablet, Take 1 tablet (10 mg total) by mouth every 6 (six) hours as needed (Nausea or vomiting). (Patient not taking: No sig reported), Disp: 30 tablet, Rfl: 1  Physical exam:  Vitals:   08/15/20 0916  BP: (!) 146/94  Pulse: 80  Resp: 16  Temp: 97.8 F (36.6 C)  TempSrc: Oral  Weight: 184 lb 3.2 oz (83.6 kg)  Height: _0  (1.803 m)   Physical Exam Constitutional:      General: He is not in acute distress. Eyes:     Extraocular Movements: EOM normal.     Pupils: Pupils are equal, round, and reactive to light.  Cardiovascular:     Rate and Rhythm: Normal rate and regular rhythm.     Heart sounds: Normal heart sounds.  Pulmonary:     Effort: Pulmonary effort is normal.  Skin:    General: Skin is warm and dry.  Neurological:     Mental Status: He is alert and  oriented to person, place, and time.      CMP Latest Ref Rng & Units 08/15/2020  Glucose 70 - 99 mg/dL 121(H)  BUN 8 - 23 mg/dL 16  Creatinine 0.61 - 1.24 mg/dL 0.79  Sodium 135 - 145 mmol/L 137  Potassium 3.5 - 5.1 mmol/L 4.0  Chloride 98 - 111 mmol/L 102  CO2 22 - 32 mmol/L 26  Calcium 8.9 - 10.3 mg/dL 8.8(L)  Total Protein 6.5 - 8.1 g/dL 6.9  Total Bilirubin 0.3 - 1.2 mg/dL 0.8  Alkaline Phos 38 - 126 U/L 97  AST 15 - 41 U/L 24  ALT 0 - 44 U/L 23   CBC Latest Ref Rng & Units 08/15/2020  WBC 4.0 - 10.5 K/uL 4.0  Hemoglobin 13.0 - 17.0 g/dL 11.4(L)  Hematocrit 39.0 - 52.0 % 36.0(L)  Platelets 150 - 400 K/uL 112(L)      Assessment and plan- Patient is a 65 y.o. 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   He is here for on treatment assessment prior to cycle 4 of FOLFOX chemotherapy with trastuzumab and Keytruda  Counts okay to proceed with cycle 4 of FOLFOX chemotherapy along with Keytruda and trastuzumab today.  He will be receiving Keytruda every 6 weeks but FOLFOX and trastuzumab will be given every 2 weeks until progression or toxicity.  Plan to repeat scans after 6 cycles of FOLFOX.  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  If his symptoms do not improve significantly and his oral intake continues to be poor and starts losing weight we will have to consider a feeding tube down the line.  Patient would like to hold off on a PEG tube at this time.  I will see him back in 2 weeks time for cycle 5 of FOLFOX chemotherapy with trastuzumab.  Neoplasm related pain: He has as needed oxycodone but does not use it much.   Visit Diagnosis 1. Encounter for antineoplastic chemotherapy   2. Encounter for monoclonal antibody treatment for malignancy   3. Encounter for  antineoplastic immunotherapy   4. Esophageal adenocarcinoma ([place])      Dr. Randa Evens, MD, MPH [facility] at [facility] [phone] 08/20/2020 5:03 PM

## 2020-08-29 ENCOUNTER — Other Ambulatory Visit: Payer: Self-pay

## 2020-08-29 ENCOUNTER — Inpatient Hospital Stay: Payer: Medicare Other

## 2020-08-29 ENCOUNTER — Encounter: Payer: Self-pay | Admitting: Oncology

## 2020-08-29 ENCOUNTER — Inpatient Hospital Stay (HOSPITAL_BASED_OUTPATIENT_CLINIC_OR_DEPARTMENT_OTHER): Payer: Medicare Other | Admitting: Oncology

## 2020-08-29 VITALS — BP 149/77 | HR 72 | Temp 97.9°F | Resp 16 | Ht 71.0 in | Wt 188.1 lb

## 2020-08-29 VITALS — BP 138/75 | HR 61 | Resp 16

## 2020-08-29 DIAGNOSIS — Z5112 Encounter for antineoplastic immunotherapy: Secondary | ICD-10-CM

## 2020-08-29 DIAGNOSIS — Z5111 Encounter for antineoplastic chemotherapy: Secondary | ICD-10-CM | POA: Diagnosis not present

## 2020-08-29 DIAGNOSIS — C159 Malignant neoplasm of esophagus, unspecified: Secondary | ICD-10-CM | POA: Diagnosis not present

## 2020-08-29 LAB — COMPREHENSIVE METABOLIC PANEL
ALT: 33 U/L (ref 0–44)
AST: 31 U/L (ref 15–41)
Albumin: 3.5 g/dL (ref 3.5–5.0)
Alkaline Phosphatase: 116 U/L (ref 38–126)
Anion gap: 11 (ref 5–15)
BUN: 10 mg/dL (ref 8–23)
CO2: 23 mmol/L (ref 22–32)
Calcium: 8.8 mg/dL — ABNORMAL LOW (ref 8.9–10.3)
Chloride: 104 mmol/L (ref 98–111)
Creatinine, Ser: 0.87 mg/dL (ref 0.61–1.24)
GFR, Estimated: >60 mL/min (ref >60)
Glucose, Bld: 153 mg/dL — ABNORMAL HIGH (ref 70–99)
Potassium: 3.7 mmol/L (ref 3.5–5.1)
Sodium: 138 mmol/L (ref 135–145)
Total Bilirubin: 1.2 mg/dL (ref 0.3–1.2)
Total Protein: 6.4 g/dL — ABNORMAL LOW (ref 6.5–8.1)

## 2020-08-29 LAB — CBC WITH DIFFERENTIAL/PLATELET
Abs Immature Granulocytes: 0.03 10*3/uL (ref 0.00–0.07)
Basophils Absolute: 0 10*3/uL (ref 0.0–0.1)
Basophils Relative: 1 %
Eosinophils Absolute: 0.2 10*3/uL (ref 0.0–0.5)
Eosinophils Relative: 4 %
HCT: 37.2 % — ABNORMAL LOW (ref 39.0–52.0)
Hemoglobin: 11.6 g/dL — ABNORMAL LOW (ref 13.0–17.0)
Immature Granulocytes: 1 %
Lymphocytes Relative: 15 %
Lymphs Abs: 0.8 10*3/uL (ref 0.7–4.0)
MCH: 27.8 pg (ref 26.0–34.0)
MCHC: 31.2 g/dL (ref 30.0–36.0)
MCV: 89 fL (ref 80.0–100.0)
Monocytes Absolute: 0.5 10*3/uL (ref 0.1–1.0)
Monocytes Relative: 9 %
Neutro Abs: 3.7 10*3/uL (ref 1.7–7.7)
Neutrophils Relative %: 70 %
Platelets: 95 10*3/uL — ABNORMAL LOW (ref 150–400)
RBC: 4.18 MIL/uL — ABNORMAL LOW (ref 4.22–5.81)
RDW: 22.4 % — ABNORMAL HIGH (ref 11.5–15.5)
WBC: 5.2 10*3/uL (ref 4.0–10.5)
nRBC: 0 % (ref 0.0–0.2)

## 2020-08-29 MED ORDER — HEPARIN SOD (PORK) LOCK FLUSH 100 UNIT/ML IV SOLN
500.0000 [IU] | Freq: Once | INTRAVENOUS | Status: DC
  Filled 2020-08-29: qty 5

## 2020-08-29 MED ORDER — FLUOROURACIL CHEMO INJECTION 2.5 GM/50ML
400.0000 mg/m2 | Freq: Once | INTRAVENOUS | Status: AC
  Administered 2020-08-29: 850 mg via INTRAVENOUS
  Filled 2020-08-29: qty 17

## 2020-08-29 MED ORDER — SODIUM CHLORIDE 0.9 % IV SOLN
10.0000 mg | Freq: Once | INTRAVENOUS | Status: AC
  Administered 2020-08-29: 10 mg via INTRAVENOUS
  Filled 2020-08-29: qty 10

## 2020-08-29 MED ORDER — DIPHENHYDRAMINE HCL 50 MG/ML IJ SOLN
50.0000 mg | Freq: Once | INTRAMUSCULAR | Status: AC
  Administered 2020-08-29: 50 mg via INTRAVENOUS
  Filled 2020-08-29: qty 1

## 2020-08-29 MED ORDER — SODIUM CHLORIDE 0.9 % IV SOLN
Freq: Once | INTRAVENOUS | Status: AC
  Filled 2020-08-29: qty 250

## 2020-08-29 MED ORDER — PALONOSETRON HCL INJECTION 0.25 MG/5ML
0.2500 mg | Freq: Once | INTRAVENOUS | Status: AC
  Administered 2020-08-29: 0.25 mg via INTRAVENOUS
  Filled 2020-08-29: qty 5

## 2020-08-29 MED ORDER — SODIUM CHLORIDE 0.9 % IV SOLN
2358.0000 mg/m2 | INTRAVENOUS | Status: DC
  Administered 2020-08-29: 5000 mg via INTRAVENOUS
  Filled 2020-08-29: qty 100

## 2020-08-29 MED ORDER — SODIUM CHLORIDE 0.9% FLUSH
10.0000 mL | Freq: Once | INTRAVENOUS | Status: AC
  Administered 2020-08-29: 10 mL via INTRAVENOUS
  Filled 2020-08-29: qty 10

## 2020-08-29 MED ORDER — ACETAMINOPHEN 325 MG PO TABS
650.0000 mg | ORAL_TABLET | Freq: Once | ORAL | Status: AC
Start: 2020-08-29 — End: 2020-08-29
  Administered 2020-08-29: 650 mg via ORAL
  Filled 2020-08-29: qty 2

## 2020-08-29 MED ORDER — LEUCOVORIN CALCIUM INJECTION 350 MG
401.0000 mg/m2 | Freq: Once | INTRAVENOUS | Status: AC
  Administered 2020-08-29: 850 mg via INTRAVENOUS
  Filled 2020-08-29: qty 42.5

## 2020-08-29 MED ORDER — TRASTUZUMAB-ANNS CHEMO 150 MG IV SOLR
336.0000 mg | Freq: Once | INTRAVENOUS | Status: AC
Start: 2020-08-29 — End: 2020-08-29
  Administered 2020-08-29: 336 mg via INTRAVENOUS
  Filled 2020-08-29: qty 16

## 2020-08-29 NOTE — Progress Notes (Signed)
PLT 95. Per Dr. [name], okay to proceed with treatment. No Oxaliplatin today.

## 2020-08-29 NOTE — Progress Notes (Signed)
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 Vicente Males which showed a large nearly obstructing mass in the lower third of the esophagus 35 cm from incisors. This was biopsied and was consistent with adenocarcinoma.HER-2 positive. PD-L1/CPS/NGS pending.   Presently patient reports dysphagia but is able to eat cereal or soft food. He has lost about 13 pounds in the last month itself. Denies any significant pain. He recently retired after many years of service and is otherwise independent of his ADLs and IADLs.  PET CT scan on 06/26/2020 showed circumferential distal esophageal mass with an SUV of 13.3 extending over 5.5 cm. Right lower paratracheal node is 0.6 cm with an SUV of 3.1. Hypermetabolic right gastric lymph nodes including 1.1 cm node with an SUV of 5.8. Numerous hypermetabolic lesions throughout the liver somewhat confluent around the periphery which were hypermetabolic between 8 and 9. Faintly hypermetabolic left adrenal gland  NGS testing showed high tumor mutational burden CPS score less than 1. CD9-NRG1 fusion.BRAF 581L, CCN E1 gain.  EMLA for fusion of BX [id number], FGF 23 gain, FGF 6 gain, MET R988C, T p53 S1 46F.ERBB2 gain but no other actionable mutations   Interval history-patient currently reports doing well and has no  difficulty swallowing.  He is drinking about 3 ensures a day.  ECOG PS- 1 Pain scale- 0 Opioid associated constipation- no  Review of systems- Review of Systems  Constitutional: Positive for malaise/fatigue. Negative for chills, fever and weight loss.  HENT: Negative for congestion, ear discharge and nosebleeds.   Eyes: Negative for blurred vision.  Respiratory: Negative for cough, hemoptysis, sputum production, shortness of breath and wheezing.   Cardiovascular: Negative for chest pain, palpitations, orthopnea and claudication.  Gastrointestinal: Negative for abdominal pain, blood in stool, constipation, diarrhea, heartburn, melena, nausea and vomiting.  Genitourinary: Negative for dysuria, flank pain, frequency, hematuria and urgency.  Musculoskeletal: Negative for back pain, joint pain and myalgias.  Skin: Negative for rash.  Neurological: Negative for dizziness, tingling, focal weakness, seizures, weakness and headaches.  Endo/Heme/Allergies: Does not bruise/bleed easily.  Psychiatric/Behavioral: Negative for depression and suicidal ideas. The patient does not have insomnia.        No Known Allergies   Past Medical History:  Diagnosis Date  . Esophageal adenocarcinoma ([place]) 06/23/2020  . Family history of brain cancer   . Family history of colon cancer   . Family history of melanoma   . Family history of stomach cancer   . GERD (gastroesophageal reflux disease)   . History of kidney stones   . Nephrolithiasis    [facility]  . Pancreatitis, acute   . Personal history of colonic polyps    Dr Nicolasa Ducking  . Psoriasis      Past Surgical History:  Procedure Laterality Date  . COLONOSCOPY    . COLONOSCOPY WITH PROPOFOL N/A 03/31/2020   Procedure: COLONOSCOPY WITH PROPOFOL;  Surgeon: Jonathon Bellows, MD;  Location: [facility]  ENDOSCOPY;  Service: Gastroenterology;  Laterality: N/A;  . ERCP    . ESOPHAGOGASTRODUODENOSCOPY (EGD) WITH PROPOFOL N/A 06/19/2020   Procedure:  ESOPHAGOGASTRODUODENOSCOPY (EGD) WITH PROPOFOL;  Surgeon: Jonathon Bellows, MD;  Location: [facility] ENDOSCOPY;  Service: Gastroenterology;  Laterality: N/A;  . groin surgery     as a child  . PORTA CATH INSERTION N/A 06/28/2020   Procedure: PORTA CATH INSERTION;  Surgeon: Algernon Huxley, MD;  Location: [place] CV LAB;  Service: Cardiovascular;  Laterality: N/A;  . SHOULDER SURGERY      Social History   Socioeconomic History  . Marital status: Married    Spouse name: Not on file  . Number of children: 2  . Years of education: Not on file  . Highest education level: Not on file  Occupational History  . 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 Smoking status: Former Smoker    Types: Cigarettes    Quit date: 07/08/1988    Years since quitting: 32.1  . Smokeless tobacco: Never Used  Vaping Use  . Vaping Use: Never used  Substance and Sexual Activity  . Alcohol use: No    Comment: recovering alcoholic for 13 years  . Drug use: No  . Sexual activity: Not Currently  Other Topics Concern  . Not on file  Social History Narrative   ** Merged History Encounter **       Social Determinants of Health   Financial Resource Strain: Not on file  Food Insecurity: Not on file  Transportation Needs: Not on file  Physical Activity: Not on file  Stress: Not on file  Social Connections: Not on file  Intimate Partner Violence: Not on file    Family History  Problem Relation Age of Onset  . Stomach cancer Mother   . Diabetes Brother   . Hypertension Brother   . Hypertension Father   . Prostate cancer Father   . Colon cancer Sister   . Cancer Other        either cervical or ovarian, d. 29s  . Brain cancer Sister      Current Outpatient Medications:  .  dexamethasone (DECADRON) 4 MG tablet, Take 2 tablets (8 mg total) by mouth daily. Start the day after chemotherapy for 2 days. Take with food., Disp: 30 tablet, Rfl: 1 .  lidocaine-prilocaine (EMLA)  cream, Apply 1 application topically as needed. Apply small amount to port site at least 1 hour prior to it being accessed, cover with plastic wrap, Disp: 30 g, Rfl: 3 .  oxyCODONE (OXY IR/ROXICODONE) 5 MG immediate release tablet, Take 1 tablet (5 mg total) by mouth every 8 (eight) hours as needed for severe pain., Disp: 60 tablet, Rfl: 0 .  pantoprazole (PROTONIX) 40 MG tablet, Take 1 tablet (40 mg total) by mouth 2 (two) times daily before a meal., Disp: 60 tablet, Rfl: 3 .  LORazepam (ATIVAN) 0.5 MG tablet, Take 1 tablet (0.5 mg total) by mouth every 6 (six) hours as needed (Nausea or vomiting). (Patient not taking: No sig reported), Disp: 30 tablet, Rfl: 0 .  ondansetron (ZOFRAN) 8 MG tablet, Take 1 tablet (8 mg total) by mouth 2 (two) times daily as needed for refractory nausea / vomiting. Start on day 3 after chemotherapy. (Patient not taking: No sig reported), Disp: 30 tablet, Rfl: 1 .  prochlorperazine (COMPAZINE) 10 MG tablet, Take 1 tablet (10 mg total) by mouth every 6 (six) hours as needed (Nausea or  vomiting). (Patient not taking: No sig reported), Disp: 30 tablet, Rfl: 1 .  sucralfate (CARAFATE) 1 g tablet, Take 1 tablet (1 g total) by mouth 2 (two) times daily. (Patient not taking: Reported on 08/29/2020), Disp: 180 tablet, Rfl: 1 No current facility-administered medications for this visit.  Facility-Administered Medications Ordered in Other Visits:  .  fluorouracil (ADRUCIL) 5,000 mg in sodium chloride 0.9 % 150 mL chemo infusion, 2,358 mg/m2 (Treatment Plan Recorded), Intravenous, 1 day or 1 dose, [name], MD .  fluorouracil (ADRUCIL) chemo injection 850 mg, 400 mg/m2 (Treatment Plan Recorded), Intravenous, Once, [name], MD .  heparin lock flush 100 unit/mL, 500 Units, Intravenous, Once, [name], MD .  leucovorin 850 mg in dextrose 5 % 250 mL infusion, 401 mg/m2 (Treatment Plan Recorded), Intravenous, Once, [name], MD, Last Rate: 585 mL/hr at 08/29/20 1051,  850 mg at 08/29/20 1051  Physical exam:  Vitals:   08/29/20 0858  BP: (!) 149/77  Pulse: 72  Resp: 16  Temp: 97.9 F (36.6 C)  TempSrc: Oral  Weight: 188 lb 1.6 oz (85.3 kg)  Height: _0  (1.803 m)   Physical Exam Eyes:     Extraocular Movements: EOM normal.  Cardiovascular:     Rate and Rhythm: Normal rate and regular rhythm.     Heart sounds: Normal heart sounds.  Pulmonary:     Effort: Pulmonary effort is normal.     Breath sounds: Normal breath sounds.  Abdominal:     General: Bowel sounds are normal.     Palpations: Abdomen is soft.  Skin:    General: Skin is warm and dry.  Neurological:     Mental Status: He is alert and oriented to person, place, and time.      CMP Latest Ref Rng & Units 08/29/2020  Glucose 70 - 99 mg/dL 153(H)  BUN 8 - 23 mg/dL 10  Creatinine 0.61 - 1.24 mg/dL 0.87  Sodium 135 - 145 mmol/L 138  Potassium 3.5 - 5.1 mmol/L 3.7  Chloride 98 - 111 mmol/L 104  CO2 22 - 32 mmol/L 23  Calcium 8.9 - 10.3 mg/dL 8.8(L)  Total Protein 6.5 - 8.1 g/dL 6.4(L)  Total Bilirubin 0.3 - 1.2 mg/dL 1.2  Alkaline Phos 38 - 126 U/L 116  AST 15 - 41 U/L 31  ALT 0 - 44 U/L 33   CBC Latest Ref Rng & Units 08/29/2020  WBC 4.0 - 10.5 K/uL 5.2  Hemoglobin 13.0 - 17.0 g/dL 11.6(L)  Hematocrit 39.0 - 52.0 % 37.2(L)  Platelets 150 - 400 K/uL 95(L)    Assessment and plan- Patient is a 65 y.o. 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 He is here for on treatment assessment prior to cycle 5 of FOLFOX chemotherapy with trastuzumab   Platelet count was 112 last week and is down to 95 today.  Possibly secondary to oxaliplatin from direct toxicity versus immune mediated from oxaliplatin.  I will hold off on giving him oxaliplatin today but he will proceed with FOLFOX and trastuzumab today.  Pump DC on day 3 and gets Udenyca.  Return to clinic in 2 weeks.  Port labs: CBC with differential, CMP and CEA.  See covering MD.  Get  5-FU chemotherapy along with trastuzumab.  If platelet counts are more than 100 he can receive oxaliplatin.  I will see him back in 4 weeks with port labs and gets 5-FU trastuzumab and Keytruda plus  minus oxaliplatin.  Plan to repeat scans after 6 treatments.  CEA went up from 102 08/12/1945 and then came down to 37.8.  Continue to monitor  Neoplasm related pain: Has as needed oxycodone but has not really required it recently  Patient also has a CD 9 NRG fusion mutation for which he has a targetable drug which can be used potentially down the line in case of progression.   Visit Diagnosis 1. Encounter for antineoplastic chemotherapy   2. Encounter for monoclonal antibody treatment for malignancy   3. Esophageal adenocarcinoma ([place])      Dr. Randa Evens, MD, MPH [facility] at [facility] [phone] 08/29/2020 11:02 AM

## 2020-08-29 NOTE — Progress Notes (Signed)
Pt doing well, no pain, eating good

## 2020-08-31 ENCOUNTER — Inpatient Hospital Stay: Payer: Medicare Other

## 2020-08-31 VITALS — BP 141/69 | HR 67 | Temp 97.3°F | Resp 18

## 2020-08-31 DIAGNOSIS — Z5112 Encounter for antineoplastic immunotherapy: Secondary | ICD-10-CM | POA: Diagnosis not present

## 2020-08-31 DIAGNOSIS — C159 Malignant neoplasm of esophagus, unspecified: Secondary | ICD-10-CM

## 2020-08-31 MED ORDER — HEPARIN SOD (PORK) LOCK FLUSH 100 UNIT/ML IV SOLN
500.0000 [IU] | Freq: Once | INTRAVENOUS | Status: AC | PRN
  Administered 2020-08-31: 500 [IU]
  Filled 2020-08-31: qty 5

## 2020-08-31 MED ORDER — SODIUM CHLORIDE 0.9% FLUSH
10.0000 mL | INTRAVENOUS | Status: DC | PRN
  Administered 2020-08-31: 10 mL
  Filled 2020-08-31: qty 10

## 2020-08-31 MED ORDER — HEPARIN SOD (PORK) LOCK FLUSH 100 UNIT/ML IV SOLN
INTRAVENOUS | Status: AC
  Filled 2020-08-31: qty 5

## 2020-08-31 MED ORDER — PEGFILGRASTIM-CBQV 6 MG/0.6ML ~~LOC~~ SOSY
6.0000 mg | PREFILLED_SYRINGE | Freq: Once | SUBCUTANEOUS | Status: AC
  Administered 2020-08-31: 6 mg via SUBCUTANEOUS
  Filled 2020-08-31: qty 0.6

## 2020-09-07 ENCOUNTER — Telehealth: Payer: Self-pay | Admitting: Licensed Clinical Social Worker

## 2020-09-07 ENCOUNTER — Encounter: Payer: Self-pay | Admitting: Licensed Clinical Social Worker

## 2020-09-07 ENCOUNTER — Ambulatory Visit: Payer: Self-pay | Admitting: Licensed Clinical Social Worker

## 2020-09-07 DIAGNOSIS — C159 Malignant neoplasm of esophagus, unspecified: Secondary | ICD-10-CM

## 2020-09-07 DIAGNOSIS — Z800 Family history of malignant neoplasm of digestive organs: Secondary | ICD-10-CM

## 2020-09-07 DIAGNOSIS — Z1379 Encounter for other screening for genetic and chromosomal anomalies: Secondary | ICD-10-CM | POA: Insufficient documentation

## 2020-09-07 DIAGNOSIS — Z86010 Personal history of colonic polyps: Secondary | ICD-10-CM

## 2020-09-07 DIAGNOSIS — Z808 Family history of malignant neoplasm of other organs or systems: Secondary | ICD-10-CM

## 2020-09-07 NOTE — Telephone Encounter (Signed)
Revealed negative genetic testing. This normal result is reassuring and indicates that it is unlikely Cory [name]'s cancer is due to a hereditary cause.  It is unlikely that there is an increased risk of another cancer due to a mutation in one of these genes.  However, genetic testing is not perfect, and cannot definitively rule out a hereditary cause.  It will be important for him to keep in contact with genetics to learn if any additional testing may be needed in the future.

## 2020-09-07 NOTE — Progress Notes (Signed)
HPI:  [name] was previously seen in the [name] clinic due to a personal and family history of cancer and concerns regarding a hereditary predisposition to cancer. Please refer to our prior cancer genetics clinic note for more information regarding our discussion, assessment and recommendations, at the time. [name] recent genetic test results were disclosed to him, as were recommendations warranted by these results. These results and recommendations are discussed in more detail below.  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Cory [name])  06/23/2020 Initial Diagnosis   Esophageal adenocarcinoma (Cory [name])   06/28/2020 Cancer Staging   Staging form: Esophagus - Adenocarcinoma, AJCC 8th Edition - Clinical stage from 06/28/2020: Stage IVB (cTX, cN1, cM1) - Signed by [name], MD on 06/28/2020   07/03/2020 -  Chemotherapy    Patient is on Treatment Plan: GASTROESOPHAGEAL FOLFOX Q14D X 6 CYCLES   Patient is on Antibody Plan: GE JUNCTION TRASTUZUMAB Q14D    07/18/2020 -  Chemotherapy    Patient is on Treatment Plan: GASTROESOPHAGEAL FOLFOX Q14D X 6 CYCLES   Patient is on Antibody Plan: GE JUNCTION TRASTUZUMAB Q14D      FAMILY HISTORY:  We obtained a detailed, 4-generation family history.  Significant diagnoses are listed below: Family History  Problem Relation Age of Onset  . Stomach cancer Mother   . Diabetes Brother   . Hypertension Brother   . Hypertension Father   . Prostate cancer Father   . Colon cancer Sister   . Cancer Other        either cervical or ovarian, d. 28s  . Brain cancer Sister     [name] has 2 daughters and 1 son. One of his daughters has had melanoma at 109, and a possible second melanoma she is currently being worked up for. Patient has a full brother and sister, no cancers. His brother has a daughter who died of either ovarian or cervical cancer in her 16s. Patient also has 2 paternal half sisters. One had colon  cancer in her 60s-70s and passed away, and the other had brain cancer in her 46s.   [name] mother had stomach cancer at 36 and died at 9. No other known cancers on this side of the family.  [name] father had prostate cancer in his 3s and died of another cause at 75. No other known cancers on this side of the family.   [name] is unaware of previous family history of genetic testing for hereditary cancer risks. Patient's maternal ancestors are of unknown descent, and paternal ancestors are of unknown descent. There is no reported Ashkenazi Jewish ancestry. There is no known consanguinity.    GENETIC TEST RESULTS: Genetic testing reported out on 09/06/2020 through the Cory [name]- cancer panel found no pathogenic mutations.   The Multi-Cancer Panel+RNA offered by [facility] includes sequencing and/or deletion duplication testing of the following 84 genes: AIP, ALK, APC, ATM, AXIN2,BAP1,  BARD1, BLM, BMPR1A, BRCA1, BRCA2, BRIP1, CASR, CDC73, CDH1, CDK4, CDKN1B, CDKN1C, CDKN2A (p14ARF), CDKN2A (p16INK4a), CEBPA, CHEK2, CTNNA1, DICER1, DIS3L2, EGFR (c.2369C>T, p.Thr790Met variant only), EPCAM (Deletion/duplication testing only), FH, FLCN, GATA2, GPC3, GREM1 (Promoter region deletion/duplication testing only), HOXB13 (c.251G>A, p.Gly84Glu), HRAS, KIT, MAX, MEN1, MET, MITF (c.952G>A, p.Glu318Lys variant only), MLH1, MSH2, MSH3, MSH6, MUTYH, NBN, NF1, NF2, NTHL1, PALB2, PDGFRA, PHOX2B, PMS2, POLD1, POLE, POT1, PRKAR1A, PTCH1, PTEN, RAD50, RAD51C, RAD51D, RB1, RECQL4, RET,  RUNX1, SDHAF2, SDHA (sequence changes only), SDHB, SDHC, SDHD, SMAD4, SMARCA4, SMARCB1, SMARCE1, STK11, SUFU, TERC,  TERT, TMEM127, TP53, TSC1, TSC2, VHL, WRN and WT1.   The test report has been scanned into EPIC and is located under the Molecular Pathology section of the Results Review tab.  A portion of the result report is included below for reference.     We discussed with [name] that because current genetic testing  is not perfect, it is possible there may be a gene mutation in one of these genes that current testing cannot detect, but that chance is small.  We also discussed, that there could be another gene that has not yet been discovered, or that we have not yet tested, that is responsible for the cancer diagnoses in the family. It is also possible there is a hereditary cause for the cancer in the family that [name] did not inherit and therefore was not identified in his testing.  Therefore, it is important to remain in touch with cancer genetics in the future so that we can continue to offer [name] the most up to date genetic testing.   ADDITIONAL GENETIC TESTING: We discussed with [name] that his genetic testing was fairly extensive.  If there are genes identified to increase cancer risk that can be analyzed in the future, we would be happy to discuss and coordinate this testing at that time.    CANCER SCREENING RECOMMENDATIONS: [name] test result is considered negative (normal).  This means that we have not identified a hereditary cause for his  personal and family history of cancer at this time. Most cancers happen by chance and this negative test suggests that his cancer may fall into this category.    While reassuring, this does not definitively rule out a hereditary predisposition to cancer. It is still possible that there could be genetic mutations that are undetectable by current technology. There could be genetic mutations in genes that have not been tested or identified to increase cancer risk.  Therefore, it is recommended he continue to follow the cancer management and screening guidelines provided by his oncology and primary healthcare provider.   An individual's cancer risk and medical management are not determined by genetic test results alone. Overall cancer risk assessment incorporates additional factors, including personal medical history, family history, and any available genetic  information that may result in a personalized plan for cancer prevention and surveillance.  RECOMMENDATIONS FOR FAMILY MEMBERS:  Relatives in this family might be at some increased risk of developing cancer, over the general population risk, simply due to the family history of cancer.  We recommended male relatives in this family have a yearly mammogram beginning at age 93, or 72 years younger than the earliest onset of cancer, an annual clinical breast exam, and perform monthly breast self-exams. Male relatives in this family should also have a gynecological exam as recommended by their primary provider.  All family members should be referred for colonoscopy starting at age 41.   FOLLOW-UP: Lastly, we discussed with [name] that cancer genetics is a rapidly advancing field and it is possible that new genetic tests will be appropriate for him and/or his family members in the future. We encouraged him to remain in contact with cancer genetics on an annual basis so we can update his personal and family histories and let him know of advances in cancer genetics that may benefit this family.   Our contact number was provided. [name] questions were answered to his satisfaction, and he knows he is welcome to call us at anytime  with additional questions or concerns.   Faith Rogue, MS, [facility] Genetic Counselor [email or web address] Phone: [phone]

## 2020-09-11 ENCOUNTER — Ambulatory Visit
Admission: RE | Admit: 2020-09-11 | Discharge: 2020-09-11 | Disposition: A | Discharge status: Home / Self Care | Payer: Medicare Other | Source: Ambulatory Visit | Attending: Radiation Oncology | Admitting: Radiation Oncology

## 2020-09-11 VITALS — Wt 192.1 lb

## 2020-09-11 DIAGNOSIS — C155 Malignant neoplasm of lower third of esophagus: Secondary | ICD-10-CM

## 2020-09-11 NOTE — Progress Notes (Signed)
Radiation Oncology Follow up Note  Name: Cory [name]   Date:   09/11/2020 MRN:  [id number] DOB: [birth year]    This 64 y.o. male presents to the clinic today for 1 month follow-up status post hypofractionated course of palliative radiation therapy to his esophagus in patient with known stage IV esophageal carcinoma of the distal esophagus.  REFERRING PROVIDER: Venia Carbon, MD  HPI: Patient is a 65 year old male now at 1 month having completed palliative radiation therapy to his esophagus for stage IV adenocarcinoma the distal esophagus seen today in follow-up he is doing well.  He specifically denies worsening dysphagia cough or hemoptysis.  He did have a little swallowing problem last night eating up a piece of steak which she describes is probably too large.Marland Kitchen  He has completed cycle 5 of FOLFOX chemotherapy with trastuzumab.  COMPLICATIONS OF TREATMENT: none  FOLLOW UP COMPLIANCE: keeps appointments   PHYSICAL EXAM:  Wt 192 lb 1.6 oz (87.1 kg)   BMI 26.79 kg/m  Well-developed well-nourished patient in NAD. HEENT reveals PERLA, EOMI, discs not visualized.  Oral cavity is clear. No oral mucosal lesions are identified. Neck is clear without evidence of cervical or supraclavicular adenopathy. Lungs are clear to A&P. Cardiac examination is essentially unremarkable with regular rate and rhythm without murmur rub or thrill. Abdomen is benign with no organomegaly or masses noted. Motor sensory and DTR levels are equal and symmetric in the upper and lower extremities. Cranial nerves II through XII are grossly intact. Proprioception is intact. No peripheral adenopathy or edema is identified. No motor or sensory levels are noted. Crude visual fields are within normal range.  RADIOLOGY RESULTS: No current films for review  PLAN: Present time patient has had excellent palliative benefit from radiation therapy.  Continues on immunotherapy and chemotherapy under Dr. Elroy Channel direction.  I have asked  to see him back in 4 to 5 months for follow-up.  We will be happy to reevaluate patient anytime should further palliative treatment be indicated.  I would like to take this opportunity to thank you for allowing me to participate in the care of your patient.Noreene Filbert, MD

## 2020-09-12 ENCOUNTER — Inpatient Hospital Stay: Payer: Medicare Other

## 2020-09-12 ENCOUNTER — Encounter: Payer: Self-pay | Admitting: Oncology

## 2020-09-12 ENCOUNTER — Inpatient Hospital Stay: Payer: Medicare Other | Attending: Nurse Practitioner

## 2020-09-12 ENCOUNTER — Inpatient Hospital Stay (HOSPITAL_BASED_OUTPATIENT_CLINIC_OR_DEPARTMENT_OTHER): Payer: Medicare Other | Admitting: Nurse Practitioner

## 2020-09-12 ENCOUNTER — Other Ambulatory Visit: Payer: Self-pay

## 2020-09-12 VITALS — BP 138/61 | HR 75 | Temp 97.8°F | Resp 17 | Wt 194.1 lb

## 2020-09-12 DIAGNOSIS — Z8049 Family history of malignant neoplasm of other genital organs: Secondary | ICD-10-CM | POA: Diagnosis not present

## 2020-09-12 DIAGNOSIS — Z833 Family history of diabetes mellitus: Secondary | ICD-10-CM | POA: Insufficient documentation

## 2020-09-12 DIAGNOSIS — Z5112 Encounter for antineoplastic immunotherapy: Secondary | ICD-10-CM | POA: Insufficient documentation

## 2020-09-12 DIAGNOSIS — Z5111 Encounter for antineoplastic chemotherapy: Secondary | ICD-10-CM

## 2020-09-12 DIAGNOSIS — Z800 Family history of malignant neoplasm of digestive organs: Secondary | ICD-10-CM | POA: Insufficient documentation

## 2020-09-12 DIAGNOSIS — G893 Neoplasm related pain (acute) (chronic): Secondary | ICD-10-CM | POA: Diagnosis not present

## 2020-09-12 DIAGNOSIS — Z87891 Personal history of nicotine dependence: Secondary | ICD-10-CM | POA: Insufficient documentation

## 2020-09-12 DIAGNOSIS — C159 Malignant neoplasm of esophagus, unspecified: Secondary | ICD-10-CM

## 2020-09-12 DIAGNOSIS — Z808 Family history of malignant neoplasm of other organs or systems: Secondary | ICD-10-CM | POA: Insufficient documentation

## 2020-09-12 DIAGNOSIS — Z86010 Personal history of colonic polyps: Secondary | ICD-10-CM | POA: Diagnosis not present

## 2020-09-12 DIAGNOSIS — Z8249 Family history of ischemic heart disease and other diseases of the circulatory system: Secondary | ICD-10-CM | POA: Diagnosis not present

## 2020-09-12 DIAGNOSIS — R1310 Dysphagia, unspecified: Secondary | ICD-10-CM | POA: Insufficient documentation

## 2020-09-12 DIAGNOSIS — Z79899 Other long term (current) drug therapy: Secondary | ICD-10-CM | POA: Insufficient documentation

## 2020-09-12 DIAGNOSIS — Z8042 Family history of malignant neoplasm of prostate: Secondary | ICD-10-CM | POA: Diagnosis not present

## 2020-09-12 DIAGNOSIS — C787 Secondary malignant neoplasm of liver and intrahepatic bile duct: Secondary | ICD-10-CM | POA: Insufficient documentation

## 2020-09-12 DIAGNOSIS — Z5189 Encounter for other specified aftercare: Secondary | ICD-10-CM | POA: Insufficient documentation

## 2020-09-12 DIAGNOSIS — C160 Malignant neoplasm of cardia: Secondary | ICD-10-CM | POA: Diagnosis present

## 2020-09-12 LAB — COMPREHENSIVE METABOLIC PANEL
ALT: 24 U/L (ref 0–44)
AST: 25 U/L (ref 15–41)
Albumin: 3.7 g/dL (ref 3.5–5.0)
Alkaline Phosphatase: 113 U/L (ref 38–126)
Anion gap: 9 (ref 5–15)
BUN: 19 mg/dL (ref 8–23)
CO2: 24 mmol/L (ref 22–32)
Calcium: 8.8 mg/dL — ABNORMAL LOW (ref 8.9–10.3)
Chloride: 106 mmol/L (ref 98–111)
Creatinine, Ser: 0.72 mg/dL (ref 0.61–1.24)
GFR, Estimated: >60 mL/min (ref >60)
Glucose, Bld: 139 mg/dL — ABNORMAL HIGH (ref 70–99)
Potassium: 3.8 mmol/L (ref 3.5–5.1)
Sodium: 139 mmol/L (ref 135–145)
Total Bilirubin: 0.9 mg/dL (ref 0.3–1.2)
Total Protein: 6.6 g/dL (ref 6.5–8.1)

## 2020-09-12 LAB — CBC WITH DIFFERENTIAL/PLATELET
Abs Immature Granulocytes: 0.09 10*3/uL — ABNORMAL HIGH (ref 0.00–0.07)
Basophils Absolute: 0 10*3/uL (ref 0.0–0.1)
Basophils Relative: 1 %
Eosinophils Absolute: 0.3 10*3/uL (ref 0.0–0.5)
Eosinophils Relative: 4 %
HCT: 38.3 % — ABNORMAL LOW (ref 39.0–52.0)
Hemoglobin: 12.1 g/dL — ABNORMAL LOW (ref 13.0–17.0)
Immature Granulocytes: 1 %
Lymphocytes Relative: 13 %
Lymphs Abs: 1 10*3/uL (ref 0.7–4.0)
MCH: 28.5 pg (ref 26.0–34.0)
MCHC: 31.6 g/dL (ref 30.0–36.0)
MCV: 90.3 fL (ref 80.0–100.0)
Monocytes Absolute: 0.5 10*3/uL (ref 0.1–1.0)
Monocytes Relative: 7 %
Neutro Abs: 5.7 10*3/uL (ref 1.7–7.7)
Neutrophils Relative %: 74 %
Platelets: 152 10*3/uL (ref 150–400)
RBC: 4.24 MIL/uL (ref 4.22–5.81)
RDW: 22.5 % — ABNORMAL HIGH (ref 11.5–15.5)
WBC: 7.6 10*3/uL (ref 4.0–10.5)
nRBC: 0 % (ref 0.0–0.2)

## 2020-09-12 MED ORDER — DIPHENHYDRAMINE HCL 50 MG/ML IJ SOLN
50.0000 mg | Freq: Once | INTRAMUSCULAR | Status: AC
  Administered 2020-09-12: 50 mg via INTRAVENOUS
  Filled 2020-09-12: qty 1

## 2020-09-12 MED ORDER — PALONOSETRON HCL INJECTION 0.25 MG/5ML
0.2500 mg | Freq: Once | INTRAVENOUS | Status: AC
  Administered 2020-09-12: 0.25 mg via INTRAVENOUS
  Filled 2020-09-12: qty 5

## 2020-09-12 MED ORDER — FLUOROURACIL CHEMO INJECTION 2.5 GM/50ML
400.0000 mg/m2 | Freq: Once | INTRAVENOUS | Status: AC
Start: 2020-09-12 — End: 2020-09-12
  Administered 2020-09-12: 850 mg via INTRAVENOUS
  Filled 2020-09-12: qty 17

## 2020-09-12 MED ORDER — TRASTUZUMAB-ANNS CHEMO 150 MG IV SOLR
4.0000 mg/kg | Freq: Once | INTRAVENOUS | Status: AC
  Administered 2020-09-12: 357 mg via INTRAVENOUS
  Filled 2020-09-12: qty 17

## 2020-09-12 MED ORDER — LEUCOVORIN CALCIUM INJECTION 350 MG
850.0000 mg | Freq: Once | INTRAVENOUS | Status: AC
  Administered 2020-09-12: 850 mg via INTRAVENOUS
  Filled 2020-09-12: qty 42.5

## 2020-09-12 MED ORDER — SODIUM CHLORIDE 0.9 % IV SOLN
10.0000 mg | Freq: Once | INTRAVENOUS | Status: AC
  Administered 2020-09-12: 10 mg via INTRAVENOUS
  Filled 2020-09-12: qty 10

## 2020-09-12 MED ORDER — ACETAMINOPHEN 325 MG PO TABS
650.0000 mg | ORAL_TABLET | Freq: Once | ORAL | Status: AC
  Administered 2020-09-12: 650 mg via ORAL
  Filled 2020-09-12: qty 2

## 2020-09-12 MED ORDER — SODIUM CHLORIDE 0.9 % IV SOLN
INTRAVENOUS | Status: DC
  Filled 2020-09-12: qty 250

## 2020-09-12 MED ORDER — DEXTROSE 5 % IV SOLN
Freq: Once | INTRAVENOUS | Status: AC
  Filled 2020-09-12: qty 250

## 2020-09-12 MED ORDER — LEUCOVORIN CALCIUM INJECTION 350 MG: 400.0000 mg/m2 | Freq: Once | INTRAVENOUS | Status: DC

## 2020-09-12 MED ORDER — FLUOROURACIL CHEMO INJECTION 5 GM/100ML
5000.0000 mg | INTRAVENOUS | Status: DC
  Administered 2020-09-12: 5000 mg via INTRAVENOUS
  Filled 2020-09-12: qty 100

## 2020-09-12 MED ORDER — DEXTROSE 5 % IV SOLN
65.0000 mg/m2 | Freq: Once | INTRAVENOUS | Status: AC
  Administered 2020-09-12: 140 mg via INTRAVENOUS
  Filled 2020-09-12: qty 10

## 2020-09-12 NOTE — Progress Notes (Signed)
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 Vicente Males which showed a large nearly obstructing mass in the lower third of the esophagus 35 cm from incisors. This was biopsied and was consistent with adenocarcinoma.HER-2 positive. PD-L1/CPS/NGS pending.   Presently patient reports dysphagia but is able to eat cereal or soft food. He has lost about 13 pounds in the last month itself. Denies any significant pain. He recently retired after many years of service and is otherwise independent of his ADLs and IADLs.  PET CT scan on 06/26/2020 showed circumferential distal esophageal mass with an SUV of 13.3 extending over 5.5 cm. Right lower paratracheal node is 0.6 cm with an SUV of 3.1. Hypermetabolic right gastric lymph nodes including 1.1 cm node with an SUV of 5.8. Numerous hypermetabolic lesions throughout the liver somewhat confluent around the periphery which were hypermetabolic between 8 and 9. Faintly hypermetabolic left adrenal gland  NGS testing showed high tumor mutational burden CPS score less than 1. CD9-NRG1 fusion.BRAF 581L, CCN E1 gain.  EMLA for fusion of BX [id number], FGF 23 gain, FGF 6 gain, MET R988C, T p53 S1 10F.ERBB2 gain but no other actionable mutations   Interval history- Tolerating treatment well without significant side effects.  No trouble swallowing. Weight is up. Energy is stable. No abnormal bleeding or bruising. Pain is well controlled.   ECOG PS- 1 Pain scale- 0 Opioid associated constipation- no  Review of systems- Review of Systems  Constitutional: Negative for chills, fever, malaise/fatigue and weight loss.  HENT: Negative for congestion, ear discharge and nosebleeds.   Eyes: Negative for blurred vision.  Respiratory: Negative for cough, hemoptysis, sputum production, shortness of breath and wheezing.   Cardiovascular: Negative for chest pain, palpitations, orthopnea and claudication.  Gastrointestinal: Negative for abdominal pain, blood in stool, constipation, diarrhea, heartburn, melena, nausea and vomiting.  Genitourinary: Negative for dysuria, flank pain, frequency, hematuria and urgency.  Musculoskeletal: Negative for back pain, joint pain and myalgias.  Skin: Negative for rash.  Neurological: Negative for dizziness, tingling, focal weakness, seizures, weakness and headaches.  Endo/Heme/Allergies: Does not bruise/bleed easily.  Psychiatric/Behavioral: Negative for depression and suicidal ideas. The patient does not have insomnia.      No Known Allergies   Past Medical History:  Diagnosis Date  . Esophageal adenocarcinoma ([place]) 06/23/2020  . Family history of brain cancer   . Family history of colon cancer   . Family history of melanoma   . Family history of stomach cancer   . GERD (gastroesophageal reflux disease)   . History of kidney stones   . Nephrolithiasis    [facility]  . Pancreatitis, acute   . Personal history of colonic polyps    Dr Nicolasa Ducking  . Psoriasis      Past Surgical History:  Procedure Laterality Date  . COLONOSCOPY    . COLONOSCOPY WITH PROPOFOL N/A 03/31/2020   Procedure: COLONOSCOPY WITH PROPOFOL;  Surgeon: Jonathon Bellows, MD;  Location: [facility] ENDOSCOPY;  Service: Gastroenterology;  Laterality: N/A;  . ERCP    . ESOPHAGOGASTRODUODENOSCOPY (EGD) WITH PROPOFOL N/A  06/19/2020   Procedure: ESOPHAGOGASTRODUODENOSCOPY (EGD) WITH PROPOFOL;  Surgeon: Jonathon Bellows, MD;  Location: [facility] ENDOSCOPY;  Service: Gastroenterology;  Laterality: N/A;  . groin surgery     as a child  . PORTA CATH INSERTION N/A 06/28/2020   Procedure: PORTA CATH INSERTION;  Surgeon: Algernon Huxley, MD;  Location: [place] CV LAB;  Service: Cardiovascular;  Laterality: N/A;  . SHOULDER SURGERY      Social History   Socioeconomic History  . Marital status: Married    Spouse name: Not on file  . Number of children: 2  . Years of education: Not on file  . Highest education level: Not on file  Occupational History  . 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 Smoking status: Former Smoker    Types: Cigarettes    Quit date: 07/08/1988    Years since quitting: 32.2  . Smokeless tobacco: Never Used  Vaping Use  . Vaping Use: Never used  Substance and Sexual Activity  . Alcohol use: No    Comment: recovering alcoholic for 13 years  . Drug use: No  . Sexual activity: Not Currently  Other Topics Concern  . Not on file  Social History Narrative   ** Merged History Encounter **       Social Determinants of Health   Financial Resource Strain: Not on file  Food Insecurity: Not on file  Transportation Needs: Not on file  Physical Activity: Not on file  Stress: Not on file  Social Connections: Not on file  Intimate Partner Violence: Not on file    Family History  Problem Relation Age of Onset  . Stomach cancer Mother   . Diabetes Brother   . Hypertension Brother   . Hypertension Father   . Prostate cancer Father   . Colon cancer Sister   . Cancer Other        either cervical or ovarian, d. 21s  . Brain cancer Sister      Current Outpatient Medications:  .  dexamethasone (DECADRON) 4 MG tablet, Take 2 tablets (8 mg total) by mouth daily. Start the day after chemotherapy for 2 days. Take with food., Disp: 30 tablet, Rfl: 1 .   lidocaine-prilocaine (EMLA) cream, Apply 1 application topically as needed. Apply small amount to port site at least 1 hour prior to it being accessed, cover with plastic wrap, Disp: 30 g, Rfl: 3 .  oxyCODONE (OXY IR/ROXICODONE) 5 MG immediate release tablet, Take 1 tablet (5 mg total) by mouth every 8 (eight) hours as needed for severe pain., Disp: 60 tablet, Rfl: 0 .  pantoprazole (PROTONIX) 40 MG tablet, Take 1 tablet (40 mg total) by mouth 2 (two) times daily before a meal., Disp: 60 tablet, Rfl: 3 .  LORazepam (ATIVAN) 0.5 MG tablet, Take 1 tablet (0.5 mg total) by mouth every 6 (six) hours as needed (Nausea or vomiting). (Patient not taking: No sig reported), Disp: 30 tablet, Rfl: 0 .  ondansetron (ZOFRAN) 8 MG tablet, Take 1 tablet (8 mg total) by mouth 2 (two) times daily as needed for refractory nausea / vomiting. Start on day 3 after chemotherapy. (Patient not taking: No sig reported), Disp: 30 tablet, Rfl: 1 .  prochlorperazine (COMPAZINE) 10 MG tablet, Take 1 tablet (10 mg total) by mouth every 6 (six) hours as needed (  Nausea or vomiting). (Patient not taking: No sig reported), Disp: 30 tablet, Rfl: 1 .  sucralfate (CARAFATE) 1 g tablet, Take 1 tablet (1 g total) by mouth 2 (two) times daily. (Patient not taking: No sig reported), Disp: 180 tablet, Rfl: 1  Physical exam:  Vitals:   09/12/20 0928  BP: 138/61  Pulse: 75  Resp: 17  Temp: 97.8 F (36.6 C)  TempSrc: Tympanic  SpO2: 99%  Weight: 194 lb 1 oz (88 kg)   Physical Exam Cardiovascular:     Rate and Rhythm: Normal rate and regular rhythm.     Heart sounds: Normal heart sounds.  Pulmonary:     Effort: Pulmonary effort is normal.     Breath sounds: Normal breath sounds.  Abdominal:     General: Bowel sounds are normal.     Palpations: Abdomen is soft.  Skin:    General: Skin is warm and dry.  Neurological:     Mental Status: He is alert and oriented to person, place, and time.  Psychiatric:        Mood and Affect:  Mood normal.        Behavior: Behavior normal.      CMP Latest Ref Rng & Units 09/12/2020  Glucose 70 - 99 mg/dL 139(H)  BUN 8 - 23 mg/dL 19  Creatinine 0.61 - 1.24 mg/dL 0.72  Sodium 135 - 145 mmol/L 139  Potassium 3.5 - 5.1 mmol/L 3.8  Chloride 98 - 111 mmol/L 106  CO2 22 - 32 mmol/L 24  Calcium 8.9 - 10.3 mg/dL 8.8(L)  Total Protein 6.5 - 8.1 g/dL 6.6  Total Bilirubin 0.3 - 1.2 mg/dL 0.9  Alkaline Phos 38 - 126 U/L 113  AST 15 - 41 U/L 25  ALT 0 - 44 U/L 24   CBC Latest Ref Rng & Units 09/12/2020  WBC 4.0 - 10.5 K/uL 7.6  Hemoglobin 13.0 - 17.0 g/dL 12.1(L)  Hematocrit 39.0 - 52.0 % 38.3(L)  Platelets 150 - 400 K/uL 152    Assessment and plan- Patient is a 65 y.o. 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 He is here for on treatment assessment prior to cycle 6 of FOLFOX chemotherapy with trastuzumab   Oxaliplatin was held with cycle 5 due to thrombocytopenia.  Platelet count was 95, down from 112.  Today, improved to 152.  No abnormal bleeding or bruising. CEA went up from 102 to 547 then back down to 37.8. Continue to monitor. Labs reviewed and acceptable to proceed with cycle 6 of FOLFOX chemotherapy with trastuzumab. Given that platelets are > 100, include oxaliplatin.  Pump off on day 3 and plan for Udenyca support. Patient also has a CD 9 NRG fusion mutation for which he has a targetable drug which can be used potentially down the line in case of progression.  Follow-up with Dr. [name] in 2 weeks with port, labs, 5-FU trastuzumab and Keytruda plus minus oxaliplatin.  Plan to repeat scans after 6 treatments.    Neoplasm related pain:  Has as needed oxycodone but has not really required it recently  Proceed with cycle 6 of FOLFOX chemotherapy with trastuzumab. Pump off on day 3 and plan for Udenyca support.   Visit Diagnosis 1. Encounter for antineoplastic chemotherapy   2. Encounter for monoclonal antibody treatment for malignancy    3. Esophageal adenocarcinoma ([place])     Beckey Rutter, DNP, AGNP-C [place] at [facility] [phone] (clinic)

## 2020-09-14 ENCOUNTER — Inpatient Hospital Stay: Payer: Medicare Other

## 2020-09-14 ENCOUNTER — Other Ambulatory Visit: Payer: Self-pay | Admitting: Oncology

## 2020-09-14 VITALS — BP 148/73 | HR 59 | Temp 97.5°F | Resp 17

## 2020-09-14 DIAGNOSIS — Z5112 Encounter for antineoplastic immunotherapy: Secondary | ICD-10-CM | POA: Diagnosis not present

## 2020-09-14 DIAGNOSIS — C159 Malignant neoplasm of esophagus, unspecified: Secondary | ICD-10-CM

## 2020-09-14 MED ORDER — SODIUM CHLORIDE 0.9% FLUSH
10.0000 mL | INTRAVENOUS | Status: DC | PRN
  Administered 2020-09-14: 10 mL
  Filled 2020-09-14: qty 10

## 2020-09-14 MED ORDER — HEPARIN SOD (PORK) LOCK FLUSH 100 UNIT/ML IV SOLN
500.0000 [IU] | Freq: Once | INTRAVENOUS | Status: AC | PRN
  Administered 2020-09-14: 500 [IU]
  Filled 2020-09-14: qty 5

## 2020-09-14 MED ORDER — PEGFILGRASTIM-CBQV 6 MG/0.6ML ~~LOC~~ SOSY
6.0000 mg | PREFILLED_SYRINGE | Freq: Once | SUBCUTANEOUS | Status: AC
Start: 2020-09-14 — End: 2020-09-14
  Administered 2020-09-14: 6 mg via SUBCUTANEOUS
  Filled 2020-09-14: qty 0.6

## 2020-09-26 ENCOUNTER — Other Ambulatory Visit: Payer: Self-pay

## 2020-09-26 ENCOUNTER — Inpatient Hospital Stay (HOSPITAL_BASED_OUTPATIENT_CLINIC_OR_DEPARTMENT_OTHER): Payer: Medicare Other | Admitting: Oncology

## 2020-09-26 ENCOUNTER — Inpatient Hospital Stay: Payer: Medicare Other

## 2020-09-26 ENCOUNTER — Encounter: Payer: Self-pay | Admitting: Oncology

## 2020-09-26 VITALS — BP 144/77 | HR 72 | Temp 96.8°F | Resp 18 | Wt 195.3 lb

## 2020-09-26 DIAGNOSIS — C159 Malignant neoplasm of esophagus, unspecified: Secondary | ICD-10-CM

## 2020-09-26 DIAGNOSIS — Z5112 Encounter for antineoplastic immunotherapy: Secondary | ICD-10-CM

## 2020-09-26 DIAGNOSIS — Z5111 Encounter for antineoplastic chemotherapy: Secondary | ICD-10-CM | POA: Diagnosis not present

## 2020-09-26 LAB — CBC WITH DIFFERENTIAL/PLATELET
Abs Immature Granulocytes: 0.09 10*3/uL — ABNORMAL HIGH (ref 0.00–0.07)
Basophils Absolute: 0.1 10*3/uL (ref 0.0–0.1)
Basophils Relative: 1 %
Eosinophils Absolute: 0.3 10*3/uL (ref 0.0–0.5)
Eosinophils Relative: 5 %
HCT: 37.4 % — ABNORMAL LOW (ref 39.0–52.0)
Hemoglobin: 12.2 g/dL — ABNORMAL LOW (ref 13.0–17.0)
Immature Granulocytes: 1 %
Lymphocytes Relative: 13 %
Lymphs Abs: 0.8 10*3/uL (ref 0.7–4.0)
MCH: 29.6 pg (ref 26.0–34.0)
MCHC: 32.6 g/dL (ref 30.0–36.0)
MCV: 90.8 fL (ref 80.0–100.0)
Monocytes Absolute: 0.6 10*3/uL (ref 0.1–1.0)
Monocytes Relative: 10 %
Neutro Abs: 4.5 10*3/uL (ref 1.7–7.7)
Neutrophils Relative %: 70 %
Platelets: 134 10*3/uL — ABNORMAL LOW (ref 150–400)
RBC: 4.12 MIL/uL — ABNORMAL LOW (ref 4.22–5.81)
RDW: 20.6 % — ABNORMAL HIGH (ref 11.5–15.5)
WBC: 6.4 10*3/uL (ref 4.0–10.5)
nRBC: 0 % (ref 0.0–0.2)

## 2020-09-26 LAB — COMPREHENSIVE METABOLIC PANEL
ALT: 27 U/L (ref 0–44)
AST: 29 U/L (ref 15–41)
Albumin: 3.8 g/dL (ref 3.5–5.0)
Alkaline Phosphatase: 135 U/L — ABNORMAL HIGH (ref 38–126)
Anion gap: 7 (ref 5–15)
BUN: 17 mg/dL (ref 8–23)
CO2: 25 mmol/L (ref 22–32)
Calcium: 9 mg/dL (ref 8.9–10.3)
Chloride: 106 mmol/L (ref 98–111)
Creatinine, Ser: 0.89 mg/dL (ref 0.61–1.24)
GFR, Estimated: >60 mL/min (ref >60)
Glucose, Bld: 118 mg/dL — ABNORMAL HIGH (ref 70–99)
Potassium: 3.7 mmol/L (ref 3.5–5.1)
Sodium: 138 mmol/L (ref 135–145)
Total Bilirubin: 0.9 mg/dL (ref 0.3–1.2)
Total Protein: 6.7 g/dL (ref 6.5–8.1)

## 2020-09-26 MED ORDER — SODIUM CHLORIDE 0.9% FLUSH
10.0000 mL | Freq: Once | INTRAVENOUS | Status: AC
  Administered 2020-09-26: 10 mL via INTRAVENOUS
  Filled 2020-09-26: qty 10

## 2020-09-26 MED ORDER — TRASTUZUMAB-ANNS CHEMO 150 MG IV SOLR
4.0000 mg/kg | Freq: Once | INTRAVENOUS | Status: AC
  Administered 2020-09-26: 357 mg via INTRAVENOUS
  Filled 2020-09-26: qty 17

## 2020-09-26 MED ORDER — PALONOSETRON HCL INJECTION 0.25 MG/5ML
0.2500 mg | Freq: Once | INTRAVENOUS | Status: AC
  Administered 2020-09-26: 0.25 mg via INTRAVENOUS
  Filled 2020-09-26: qty 5

## 2020-09-26 MED ORDER — SODIUM CHLORIDE 0.9 % IV SOLN
Freq: Once | INTRAVENOUS | Status: AC
  Filled 2020-09-26: qty 250

## 2020-09-26 MED ORDER — OXALIPLATIN CHEMO INJECTION 100 MG/20ML
65.0000 mg/m2 | Freq: Once | INTRAVENOUS | Status: AC
  Administered 2020-09-26: 140 mg via INTRAVENOUS
  Filled 2020-09-26: qty 20

## 2020-09-26 MED ORDER — FLUOROURACIL CHEMO INJECTION 2.5 GM/50ML
400.0000 mg/m2 | Freq: Once | INTRAVENOUS | Status: AC
  Administered 2020-09-26: 850 mg via INTRAVENOUS
  Filled 2020-09-26: qty 17

## 2020-09-26 MED ORDER — ACETAMINOPHEN 325 MG PO TABS
650.0000 mg | ORAL_TABLET | Freq: Once | ORAL | Status: AC
  Administered 2020-09-26: 650 mg via ORAL
  Filled 2020-09-26: qty 2

## 2020-09-26 MED ORDER — SODIUM CHLORIDE 0.9 % IV SOLN
5000.0000 mg | INTRAVENOUS | Status: DC
  Administered 2020-09-26: 5000 mg via INTRAVENOUS
  Filled 2020-09-26: qty 100

## 2020-09-26 MED ORDER — SODIUM CHLORIDE 0.9 % IV SOLN
10.0000 mg | Freq: Once | INTRAVENOUS | Status: AC
  Administered 2020-09-26: 10 mg via INTRAVENOUS
  Filled 2020-09-26: qty 10

## 2020-09-26 MED ORDER — DEXTROSE 5 % IV SOLN
Freq: Once | INTRAVENOUS | Status: AC
  Filled 2020-09-26: qty 250

## 2020-09-26 MED ORDER — SODIUM CHLORIDE 0.9 % IV SOLN
400.0000 mg | Freq: Once | INTRAVENOUS | Status: AC
  Administered 2020-09-26: 400 mg via INTRAVENOUS
  Filled 2020-09-26: qty 16

## 2020-09-26 MED ORDER — LEUCOVORIN CALCIUM INJECTION 350 MG
850.0000 mg | Freq: Once | INTRAVENOUS | Status: AC
  Administered 2020-09-26: 850 mg via INTRAVENOUS
  Filled 2020-09-26: qty 25

## 2020-09-26 MED ORDER — DIPHENHYDRAMINE HCL 50 MG/ML IJ SOLN
50.0000 mg | Freq: Once | INTRAMUSCULAR | Status: AC
  Administered 2020-09-26: 50 mg via INTRAVENOUS
  Filled 2020-09-26: qty 1

## 2020-09-26 NOTE — Progress Notes (Signed)
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 Vicente Males which showed a large nearly obstructing mass in the lower third of the esophagus 35 cm from incisors. This was biopsied and was consistent with adenocarcinoma.HER-2 positive. PD-l1 <1 %, TMB HIGH  Presently patient reports dysphagia but is able to eat cereal or soft food. He has lost about 13 pounds in the last month itself. Denies any significant pain. He recently retired after many years of service and is otherwise independent of his ADLs and IADLs.  PET CT scan on 06/26/2020 showed circumferential distal esophageal mass with an SUV of 13.3 extending over 5.5 cm. Right lower paratracheal node is 0.6 cm with an SUV of 3.1. Hypermetabolic right gastric lymph nodes including 1.1 cm node with an SUV of 5.8. Numerous hypermetabolic lesions throughout the liver somewhat confluent around the periphery which were hypermetabolic between 8 and 9. Faintly hypermetabolic left adrenal gland  NGS testing showed high tumor mutational burden CPS score less than 1. CD9-NRG1 fusion.BRAF 581L, CCN E1 gain.  EMLA for fusion of BX [id number], FGF 23 gain, FGF 6 gain, MET R988C, T p53 S1 58F.ERBB2 gain but no other actionable mutations  Interval history-patient reports doing well and denies any  complaints at this time.  Appetite and weight have improved and he is able to eat normal foods except for harder foods like steak with sometimes do not go down well.  ECOG PS- 1 Pain scale- 0   Review of systems- Review of Systems  Constitutional: Negative for chills, fever, malaise/fatigue and weight loss.  HENT: Negative for congestion, ear discharge and nosebleeds.   Eyes: Negative for blurred vision.  Respiratory: Negative for cough, hemoptysis, sputum production, shortness of breath and wheezing.   Cardiovascular: Negative for chest pain, palpitations, orthopnea and claudication.  Gastrointestinal: Negative for abdominal pain, blood in stool, constipation, diarrhea, heartburn, melena, nausea and vomiting.  Genitourinary: Negative for dysuria, flank pain, frequency, hematuria and urgency.  Musculoskeletal: Negative for back pain, joint pain and myalgias.  Skin: Negative for rash.  Neurological: Negative for dizziness, tingling, focal weakness, seizures, weakness and headaches.  Endo/Heme/Allergies: Does not bruise/bleed easily.  Psychiatric/Behavioral: Negative for depression and suicidal ideas. The patient does not have insomnia.       No Known Allergies   Past Medical History:  Diagnosis Date  . Esophageal adenocarcinoma ([place]) 06/23/2020  . Family history of brain cancer   . Family history of colon cancer   . Family history of melanoma   . Family history of stomach cancer   . GERD (gastroesophageal reflux disease)   . History of kidney stones   . Nephrolithiasis    [facility]  . Pancreatitis, acute   . Personal history of colonic polyps    Dr Nicolasa Ducking  . Psoriasis      Past Surgical History:  Procedure Laterality Date  . COLONOSCOPY    . COLONOSCOPY WITH PROPOFOL  N/A 03/31/2020   Procedure: COLONOSCOPY WITH PROPOFOL;  Surgeon: Jonathon Bellows, MD;  Location: [facility] ENDOSCOPY;  Service: Gastroenterology;  Laterality: N/A;  . ERCP    . ESOPHAGOGASTRODUODENOSCOPY (EGD) WITH  PROPOFOL N/A 06/19/2020   Procedure: ESOPHAGOGASTRODUODENOSCOPY (EGD) WITH PROPOFOL;  Surgeon: Jonathon Bellows, MD;  Location[facility] ENDOSCOPY;  Service: Gastroenterology;  Laterality: N/A;  . groin surgery     as a child  . PORTA CATH INSERTION N/A 06/28/2020   Procedure: PORTA CATH INSERTION;  Surgeon: Algernon Huxley, MD;  Location: [place] CV LAB;  Service: Cardiovascular;  Laterality: N/A;  . SHOULDER SURGERY      Social History   Socioeconomic History  . Marital status: Married    Spouse name: Not on file  . Number of children: 2  . Years of education: Not on file  . Highest education level: Not on file  Occupational History  . 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 Smoking status: Former Smoker    Types: Cigarettes    Quit date: 07/08/1988    Years since quitting: 32.2  . Smokeless tobacco: Never Used  Vaping Use  . Vaping Use: Never used  Substance and Sexual Activity  . Alcohol use: No    Comment: recovering alcoholic for 13 years  . Drug use: No  . Sexual activity: Not Currently  Other Topics Concern  . Not on file  Social History Narrative   ** Merged History Encounter **       Social Determinants of Health   Financial Resource Strain: Not on file  Food Insecurity: Not on file  Transportation Needs: Not on file  Physical Activity: Not on file  Stress: Not on file  Social Connections: Not on file  Intimate Partner Violence: Not on file    Family History  Problem Relation Age of Onset  . Stomach cancer Mother   . Diabetes Brother   . Hypertension Brother   . Hypertension Father   . Prostate cancer Father   . Colon cancer Sister   . Cancer Other        either cervical or ovarian, d. 3s  . Brain cancer Sister      Current Outpatient Medications:  .  dexamethasone (DECADRON) 4 MG tablet, Take 2 tablets (8 mg total) by mouth daily. Start the day after chemotherapy for 2 days. Take with food., Disp: 30 tablet,  Rfl: 1 .  lidocaine-prilocaine (EMLA) cream, Apply 1 application topically as needed. Apply small amount to port site at least 1 hour prior to it being accessed, cover with plastic wrap, Disp: 30 g, Rfl: 3 .  pantoprazole (PROTONIX) 40 MG tablet, Take 1 tablet (40 mg total) by mouth 2 (two) times daily before a meal., Disp: 60 tablet, Rfl: 3 .  LORazepam (ATIVAN) 0.5 MG tablet, Take 1 tablet (0.5 mg total) by mouth every 6 (six) hours as needed (Nausea or vomiting). (Patient not taking: No sig reported), Disp: 30 tablet, Rfl: 0 .  ondansetron (ZOFRAN) 8 MG tablet, Take 1 tablet (8 mg total) by mouth 2 (two) times daily as needed for refractory nausea / vomiting. Start on day 3 after chemotherapy. (Patient not taking: No sig reported), Disp: 30 tablet, Rfl: 1 .  oxyCODONE (OXY IR/ROXICODONE) 5 MG immediate release tablet, Take 1 tablet (5 mg total) by mouth every 8 (eight) hours as needed for severe pain. (Patient not taking: Reported on 09/26/2020), Disp: 60 tablet, Rfl: 0 .  prochlorperazine (COMPAZINE) 10 MG tablet, Take 1 tablet (10 mg total) by mouth every 6 (six) hours as needed (Nausea or vomiting). (Patient not taking: No sig reported), Disp: 30 tablet, Rfl: 1 .  sucralfate (CARAFATE) 1 g tablet, Take 1 tablet (1 g total) by mouth 2 (two) times daily. (Patient not taking: No sig reported), Disp: 180 tablet, Rfl: 1 No current facility-administered medications for this visit.  Facility-Administered Medications Ordered in Other Visits:  .  fluorouracil (ADRUCIL) 5,000 mg in sodium chloride 0.9 % 150 mL chemo infusion, 5,000 mg, Intravenous, 1 day or 1 dose, [name], MD .  fluorouracil (ADRUCIL) chemo injection 850 mg, 400 mg/m2 (Treatment Plan Recorded), Intravenous, Once, [name], MD .  leucovorin 850 mg in dextrose 5 % 250 mL infusion, 850 mg, Intravenous, Once, [name], MD, Last Rate: 146 mL/hr at 09/26/20 1203, 850 mg at 09/26/20 1203 .  oxaliplatin (ELOXATIN) 140 mg in  dextrose 5 % 500 mL chemo infusion, 65 mg/m2 (Treatment Plan Recorded), Intravenous, Once, [name], MD, Last Rate: 264 mL/hr at 09/26/20 1202, 140 mg at 09/26/20 1202  Physical exam:  Vitals:   09/26/20 0858  BP: (!) 144/77  Pulse: 72  Resp: 18  Temp: (!) 96.8 F (36 C)  TempSrc: Tympanic  SpO2: 99%  Weight: 195 lb 4.8 oz (88.6 kg)   Physical Exam Constitutional:      General: He is not in acute distress. Cardiovascular:     Rate and Rhythm: Normal rate.     Heart sounds: Normal heart sounds.  Pulmonary:     Effort: Pulmonary effort is normal.  Skin:    General: Skin is warm and dry.  Neurological:     Mental Status: He is alert and oriented to person, place, and time.      CMP Latest Ref Rng & Units 09/26/2020  Glucose 70 - 99 mg/dL 118(H)  BUN 8 - 23 mg/dL 17  Creatinine 0.61 - 1.24 mg/dL 0.89  Sodium 135 - 145 mmol/L 138  Potassium 3.5 - 5.1 mmol/L 3.7  Chloride 98 - 111 mmol/L 106  CO2 22 - 32 mmol/L 25  Calcium 8.9 - 10.3 mg/dL 9.0  Total Protein 6.5 - 8.1 g/dL 6.7  Total Bilirubin 0.3 - 1.2 mg/dL 0.9  Alkaline Phos 38 - 126 U/L 135(H)  AST 15 - 41 U/L 29  ALT 0 - 44 U/L 27   CBC Latest Ref Rng & Units 09/26/2020  WBC 4.0 - 10.5 K/uL 6.4  Hemoglobin 13.0 - 17.0 g/dL 12.2(L)  Hematocrit 39.0 - 52.0 % 37.4(L)  Platelets 150 - 400 K/uL 134(L)      Assessment and plan- Patient is a 65 y.o. 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  He is here for on treatment assessment prior to cycle 7 of FOLFOX chemotherapy with trastuzumab and Keytruda  Counts okay to proceed with FOLFOX trastuzumab and Keytruda today.  Platelet counts improved to 134.  Hemoglobin remaining stable around 12.  He will come back on day 3 for pump disconnect and receive Udenyca  Repeat CT chest abdomen and pelvis with contrast in 10 days.  I will see him back in 2 weeks for FOLFOX trastuzumab.  Needs echocardiogram 3 to 4 weeks from now    Visit Diagnosis 1. Esophageal adenocarcinoma ([place])   2. Encounter for antineoplastic chemotherapy   3. Encounter for monoclonal antibody treatment for malignancy   4. Encounter for antineoplastic  immunotherapy      Dr. Randa Evens, MD, MPH [facility] at [facility] [phone] 09/26/2020 12:55 PM

## 2020-09-26 NOTE — Progress Notes (Signed)
Pt In for follow up, reports has had some dizziness over the last week.

## 2020-09-28 ENCOUNTER — Inpatient Hospital Stay: Payer: Medicare Other

## 2020-09-28 VITALS — BP 141/75 | HR 73 | Temp 97.8°F

## 2020-09-28 DIAGNOSIS — C159 Malignant neoplasm of esophagus, unspecified: Secondary | ICD-10-CM

## 2020-09-28 DIAGNOSIS — Z5112 Encounter for antineoplastic immunotherapy: Secondary | ICD-10-CM | POA: Diagnosis not present

## 2020-09-28 MED ORDER — HEPARIN SOD (PORK) LOCK FLUSH 100 UNIT/ML IV SOLN
INTRAVENOUS | Status: AC
  Filled 2020-09-28: qty 5

## 2020-09-28 MED ORDER — HEPARIN SOD (PORK) LOCK FLUSH 100 UNIT/ML IV SOLN
500.0000 [IU] | Freq: Once | INTRAVENOUS | Status: AC | PRN
  Administered 2020-09-28: 500 [IU]
  Filled 2020-09-28: qty 5

## 2020-09-28 MED ORDER — SODIUM CHLORIDE 0.9% FLUSH
10.0000 mL | INTRAVENOUS | Status: DC | PRN
  Administered 2020-09-28: 10 mL
  Filled 2020-09-28: qty 10

## 2020-09-28 MED ORDER — PEGFILGRASTIM-CBQV 6 MG/0.6ML ~~LOC~~ SOSY
6.0000 mg | PREFILLED_SYRINGE | Freq: Once | SUBCUTANEOUS | Status: AC
  Administered 2020-09-28: 6 mg via SUBCUTANEOUS
  Filled 2020-09-28: qty 0.6

## 2020-10-10 ENCOUNTER — Encounter: Payer: Self-pay | Admitting: Oncology

## 2020-10-10 ENCOUNTER — Inpatient Hospital Stay: Payer: Medicare Other | Attending: Oncology

## 2020-10-10 ENCOUNTER — Inpatient Hospital Stay: Payer: Medicare Other

## 2020-10-10 ENCOUNTER — Inpatient Hospital Stay (HOSPITAL_BASED_OUTPATIENT_CLINIC_OR_DEPARTMENT_OTHER): Payer: Medicare Other | Admitting: Oncology

## 2020-10-10 VITALS — BP 147/76 | HR 63 | Resp 16

## 2020-10-10 VITALS — BP 137/73 | HR 70 | Temp 97.6°F | Resp 20 | Wt 193.3 lb

## 2020-10-10 DIAGNOSIS — Z808 Family history of malignant neoplasm of other organs or systems: Secondary | ICD-10-CM | POA: Diagnosis not present

## 2020-10-10 DIAGNOSIS — Z79899 Other long term (current) drug therapy: Secondary | ICD-10-CM | POA: Insufficient documentation

## 2020-10-10 DIAGNOSIS — C159 Malignant neoplasm of esophagus, unspecified: Secondary | ICD-10-CM

## 2020-10-10 DIAGNOSIS — Z5111 Encounter for antineoplastic chemotherapy: Secondary | ICD-10-CM

## 2020-10-10 DIAGNOSIS — Z5189 Encounter for other specified aftercare: Secondary | ICD-10-CM | POA: Insufficient documentation

## 2020-10-10 DIAGNOSIS — Z8042 Family history of malignant neoplasm of prostate: Secondary | ICD-10-CM | POA: Diagnosis not present

## 2020-10-10 DIAGNOSIS — R1310 Dysphagia, unspecified: Secondary | ICD-10-CM | POA: Diagnosis not present

## 2020-10-10 DIAGNOSIS — Z833 Family history of diabetes mellitus: Secondary | ICD-10-CM | POA: Insufficient documentation

## 2020-10-10 DIAGNOSIS — Z8049 Family history of malignant neoplasm of other genital organs: Secondary | ICD-10-CM | POA: Diagnosis not present

## 2020-10-10 DIAGNOSIS — C778 Secondary and unspecified malignant neoplasm of lymph nodes of multiple regions: Secondary | ICD-10-CM | POA: Diagnosis not present

## 2020-10-10 DIAGNOSIS — Z800 Family history of malignant neoplasm of digestive organs: Secondary | ICD-10-CM | POA: Insufficient documentation

## 2020-10-10 DIAGNOSIS — Z87891 Personal history of nicotine dependence: Secondary | ICD-10-CM | POA: Diagnosis not present

## 2020-10-10 DIAGNOSIS — Z5112 Encounter for antineoplastic immunotherapy: Secondary | ICD-10-CM | POA: Diagnosis present

## 2020-10-10 DIAGNOSIS — Z86010 Personal history of colonic polyps: Secondary | ICD-10-CM | POA: Diagnosis not present

## 2020-10-10 DIAGNOSIS — Z87442 Personal history of urinary calculi: Secondary | ICD-10-CM | POA: Diagnosis not present

## 2020-10-10 DIAGNOSIS — C160 Malignant neoplasm of cardia: Secondary | ICD-10-CM | POA: Insufficient documentation

## 2020-10-10 DIAGNOSIS — C787 Secondary malignant neoplasm of liver and intrahepatic bile duct: Secondary | ICD-10-CM | POA: Insufficient documentation

## 2020-10-10 DIAGNOSIS — R12 Heartburn: Secondary | ICD-10-CM | POA: Insufficient documentation

## 2020-10-10 DIAGNOSIS — C7989 Secondary malignant neoplasm of other specified sites: Secondary | ICD-10-CM

## 2020-10-10 DIAGNOSIS — I700 Atherosclerosis of aorta: Secondary | ICD-10-CM | POA: Insufficient documentation

## 2020-10-10 LAB — CBC WITH DIFFERENTIAL/PLATELET
Abs Immature Granulocytes: 0.25 10*3/uL — ABNORMAL HIGH (ref 0.00–0.07)
Basophils Absolute: 0.1 10*3/uL (ref 0.0–0.1)
Basophils Relative: 1 %
Eosinophils Absolute: 0.3 10*3/uL (ref 0.0–0.5)
Eosinophils Relative: 5 %
HCT: 40.5 % (ref 39.0–52.0)
Hemoglobin: 13.1 g/dL (ref 13.0–17.0)
Immature Granulocytes: 4 %
Lymphocytes Relative: 13 %
Lymphs Abs: 0.8 10*3/uL (ref 0.7–4.0)
MCH: 29.8 pg (ref 26.0–34.0)
MCHC: 32.3 g/dL (ref 30.0–36.0)
MCV: 92 fL (ref 80.0–100.0)
Monocytes Absolute: 0.4 10*3/uL (ref 0.1–1.0)
Monocytes Relative: 7 %
Neutro Abs: 4.6 10*3/uL (ref 1.7–7.7)
Neutrophils Relative %: 70 %
Platelets: 95 10*3/uL — ABNORMAL LOW (ref 150–400)
RBC: 4.4 MIL/uL (ref 4.22–5.81)
RDW: 18.5 % — ABNORMAL HIGH (ref 11.5–15.5)
WBC: 6.4 10*3/uL (ref 4.0–10.5)
nRBC: 0 % (ref 0.0–0.2)

## 2020-10-10 LAB — COMPREHENSIVE METABOLIC PANEL
ALT: 27 U/L (ref 0–44)
AST: 33 U/L (ref 15–41)
Albumin: 3.7 g/dL (ref 3.5–5.0)
Alkaline Phosphatase: 151 U/L — ABNORMAL HIGH (ref 38–126)
Anion gap: 10 (ref 5–15)
BUN: 13 mg/dL (ref 8–23)
CO2: 23 mmol/L (ref 22–32)
Calcium: 9 mg/dL (ref 8.9–10.3)
Chloride: 104 mmol/L (ref 98–111)
Creatinine, Ser: 0.88 mg/dL (ref 0.61–1.24)
GFR, Estimated: >60 mL/min (ref >60)
Glucose, Bld: 170 mg/dL — ABNORMAL HIGH (ref 70–99)
Potassium: 3.8 mmol/L (ref 3.5–5.1)
Sodium: 137 mmol/L (ref 135–145)
Total Bilirubin: 0.8 mg/dL (ref 0.3–1.2)
Total Protein: 6.6 g/dL (ref 6.5–8.1)

## 2020-10-10 MED ORDER — SODIUM CHLORIDE 0.9 % IV SOLN
Freq: Once | INTRAVENOUS | Status: AC
  Filled 2020-10-10: qty 250

## 2020-10-10 MED ORDER — SODIUM CHLORIDE 0.9 % IV SOLN
10.0000 mg | Freq: Once | INTRAVENOUS | Status: AC
  Administered 2020-10-10: 10 mg via INTRAVENOUS
  Filled 2020-10-10: qty 10

## 2020-10-10 MED ORDER — TRASTUZUMAB-ANNS CHEMO 150 MG IV SOLR
4.0000 mg/kg | Freq: Once | INTRAVENOUS | Status: AC
  Administered 2020-10-10: 357 mg via INTRAVENOUS
  Filled 2020-10-10: qty 17

## 2020-10-10 MED ORDER — DIPHENHYDRAMINE HCL 50 MG/ML IJ SOLN
50.0000 mg | Freq: Once | INTRAMUSCULAR | Status: AC
  Administered 2020-10-10: 50 mg via INTRAVENOUS
  Filled 2020-10-10: qty 1

## 2020-10-10 MED ORDER — FLUOROURACIL CHEMO INJECTION 2.5 GM/50ML
400.0000 mg/m2 | Freq: Once | INTRAVENOUS | Status: AC
  Administered 2020-10-10: 850 mg via INTRAVENOUS
  Filled 2020-10-10: qty 17

## 2020-10-10 MED ORDER — SODIUM CHLORIDE 0.9 % IV SOLN: 400.0000 mg | Freq: Once | INTRAVENOUS | Status: DC

## 2020-10-10 MED ORDER — SODIUM CHLORIDE 0.9 % IV SOLN
5000.0000 mg | INTRAVENOUS | Status: DC
  Administered 2020-10-10: 5000 mg via INTRAVENOUS
  Filled 2020-10-10: qty 100

## 2020-10-10 MED ORDER — PALONOSETRON HCL INJECTION 0.25 MG/5ML
0.2500 mg | Freq: Once | INTRAVENOUS | Status: AC
  Administered 2020-10-10: 0.25 mg via INTRAVENOUS
  Filled 2020-10-10: qty 5

## 2020-10-10 MED ORDER — ACETAMINOPHEN 325 MG PO TABS
650.0000 mg | ORAL_TABLET | Freq: Once | ORAL | Status: AC
Start: 2020-10-10 — End: 2020-10-10
  Administered 2020-10-10: 650 mg via ORAL
  Filled 2020-10-10: qty 2

## 2020-10-10 MED ORDER — LEUCOVORIN CALCIUM INJECTION 350 MG
850.0000 mg | Freq: Once | INTRAVENOUS | Status: AC
  Administered 2020-10-10: 850 mg via INTRAVENOUS
  Filled 2020-10-10: qty 42.5

## 2020-10-10 NOTE — Progress Notes (Signed)
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 Vicente Males which showed a large nearly obstructing mass in the lower third of the esophagus 35 cm from incisors. This was biopsied and was consistent with adenocarcinoma.HER-2 positive. PD-l1 <1 %, TMB HIGH  Presently patient reports dysphagia but is able to eat cereal or soft food. He has lost about 13 pounds in the last month itself. Denies any significant pain. He recently retired after many years of service and is otherwise independent of his ADLs and IADLs.  PET CT scan on 06/26/2020 showed circumferential distal esophageal mass with an SUV of 13.3 extending over 5.5 cm. Right lower paratracheal node is 0.6 cm with an SUV of 3.1. Hypermetabolic right gastric lymph nodes including 1.1 cm node with an SUV of 5.8. Numerous hypermetabolic lesions throughout the liver somewhat confluent around the periphery which were hypermetabolic between 8 and 9. Faintly hypermetabolic left adrenal gland  NGS testing showed high tumor mutational burden CPS score less than 1.CD9-NRG1 fusion.BRAF 581L, CCN E1 gain. EMLA for fusion of BX [id number], FGF 23 gain, FGF 6 gain, MET R988C, T p53 S1 56F.ERBB2 gain but no other actionable mutations  Interval history-patient reports doing well and denies any complaints at this  time.  Tolerating treatment well without any significant peripheral neuropathy.  Appetite and weight have remained stable.  ECOG PS- 1 Pain scale- 0   Review of systems- Review of Systems  Constitutional: Negative for chills, fever, malaise/fatigue and weight loss.  HENT: Negative for congestion, ear discharge and nosebleeds.   Eyes: Negative for blurred vision.  Respiratory: Negative for cough, hemoptysis, sputum production, shortness of breath and wheezing.   Cardiovascular: Negative for chest pain, palpitations, orthopnea and claudication.  Gastrointestinal: Negative for abdominal pain, blood in stool, constipation, diarrhea, heartburn, melena, nausea and vomiting.  Genitourinary: Negative for dysuria, flank pain, frequency, hematuria and urgency.  Musculoskeletal: Negative for back pain, joint pain and myalgias.  Skin: Negative for rash.  Neurological: Negative for dizziness, tingling, focal weakness, seizures, weakness and headaches.  Endo/Heme/Allergies: Does not bruise/bleed easily.  Psychiatric/Behavioral: Negative for depression and suicidal ideas. The patient does not have insomnia.        No Known Allergies   Past Medical History:  Diagnosis Date  . Esophageal adenocarcinoma ([place]) 06/23/2020  . Family history of brain cancer   . Family history of colon cancer   . Family history of melanoma   . Family history of stomach cancer   . GERD (gastroesophageal reflux disease)   . History of kidney stones   . Nephrolithiasis    [facility]  . Pancreatitis, acute   . Personal history of colonic polyps    Dr Nicolasa Ducking  . Psoriasis      Past Surgical History:  Procedure Laterality Date  . COLONOSCOPY    . COLONOSCOPY WITH PROPOFOL N/A 03/31/2020   Procedure: COLONOSCOPY WITH PROPOFOL;  Surgeon: Jonathon Bellows, MD;  Location: [facility] ENDOSCOPY;  Service: Gastroenterology;  Laterality: N/A;  . ERCP    . ESOPHAGOGASTRODUODENOSCOPY (EGD) WITH PROPOFOL N/A 06/19/2020   Procedure:  ESOPHAGOGASTRODUODENOSCOPY (EGD) WITH PROPOFOL;  Surgeon: Jonathon Bellows, MD;  Location[facility] ENDOSCOPY;  Service: Gastroenterology;  Laterality: N/A;  . groin surgery     as a child  . PORTA CATH INSERTION N/A 06/28/2020   Procedure: PORTA CATH INSERTION;  Surgeon: Algernon Huxley, MD;  Location: [place] CV LAB;  Service: Cardiovascular;  Laterality: N/A;  . SHOULDER SURGERY      Social History   Socioeconomic History  . Marital status: Married    Spouse name: Not on file  . Number of children: 2  . Years of education: Not on file  . Highest education level: Not on file  Occupational History  . 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 Smoking status: Former Smoker    Types: Cigarettes    Quit date: 07/08/1988    Years since quitting: 32.2  . Smokeless tobacco: Never Used  Vaping Use  . Vaping Use: Never used  Substance and Sexual Activity  . Alcohol use: No    Comment: recovering alcoholic for 13 years  . Drug use: No  . Sexual activity: Not Currently  Other Topics Concern  . Not on file  Social History Narrative   ** Merged History Encounter **       Social Determinants of Health   Financial Resource Strain: Not on file  Food Insecurity: Not on file  Transportation Needs: Not on file  Physical Activity: Not on file  Stress: Not on file  Social Connections: Not on file  Intimate Partner Violence: Not on file    Family History  Problem Relation Age of Onset  . Stomach cancer Mother   . Diabetes Brother   . Hypertension Brother   . Hypertension Father   . Prostate cancer Father   . Colon cancer Sister   . Cancer Other        either cervical or ovarian, d. 7s  . Brain cancer Sister      Current Outpatient Medications:  .  dexamethasone (DECADRON) 4 MG tablet, Take 2 tablets (8 mg total) by mouth daily. Start the day after chemotherapy for 2 days. Take with food., Disp: 30 tablet, Rfl: 1 .  lidocaine-prilocaine (EMLA)  cream, Apply 1 application topically as needed. Apply small amount to port site at least 1 hour prior to it being accessed, cover with plastic wrap, Disp: 30 g, Rfl: 3 .  pantoprazole (PROTONIX) 40 MG tablet, Take 1 tablet (40 mg total) by mouth 2 (two) times daily before a meal., Disp: 60 tablet, Rfl: 3 .  LORazepam (ATIVAN) 0.5 MG tablet, Take 1 tablet (0.5 mg total) by mouth every 6 (six) hours as needed (Nausea or vomiting). (Patient not taking: No sig reported), Disp: 30 tablet, Rfl: 0 .  ondansetron (ZOFRAN) 8 MG tablet, Take 1 tablet (8 mg total) by mouth 2 (two) times daily as needed for refractory nausea / vomiting. Start on day 3 after chemotherapy. (Patient not taking: No sig reported), Disp: 30 tablet, Rfl: 1 .  oxyCODONE (OXY IR/ROXICODONE) 5 MG immediate release tablet, Take 1 tablet (5 mg total) by mouth every 8 (eight) hours as needed for severe pain. (Patient not taking: No sig reported), Disp: 60 tablet, Rfl: 0 .  prochlorperazine (COMPAZINE) 10 MG tablet, Take 1 tablet (10 mg total) by mouth  every 6 (six) hours as needed (Nausea or vomiting). (Patient not taking: No sig reported), Disp: 30 tablet, Rfl: 1 .  sucralfate (CARAFATE) 1 g tablet, Take 1 tablet (1 g total) by mouth 2 (two) times daily. (Patient not taking: No sig reported), Disp: 180 tablet, Rfl: 1  Physical exam:  Vitals:   10/10/20 0845  BP: 137/73  Pulse: 70  Resp: 20  Temp: 97.6 F (36.4 C)  TempSrc: Tympanic  SpO2: 99%  Weight: 193 lb 4.8 oz (87.7 kg)   Physical Exam Constitutional:      General: He is not in acute distress. Cardiovascular:     Rate and Rhythm: Normal rate.  Pulmonary:     Effort: Pulmonary effort is normal.  Skin:    General: Skin is warm and dry.  Neurological:     Mental Status: He is alert and oriented to person, place, and time.      CMP Latest Ref Rng & Units 10/10/2020  Glucose 70 - 99 mg/dL 170(H)  BUN 8 - 23 mg/dL 13  Creatinine 0.61 - 1.24 mg/dL 0.88  Sodium 135 - 145  mmol/L 137  Potassium 3.5 - 5.1 mmol/L 3.8  Chloride 98 - 111 mmol/L 104  CO2 22 - 32 mmol/L 23  Calcium 8.9 - 10.3 mg/dL 9.0  Total Protein 6.5 - 8.1 g/dL 6.6  Total Bilirubin 0.3 - 1.2 mg/dL 0.8  Alkaline Phos 38 - 126 U/L 151(H)  AST 15 - 41 U/L 33  ALT 0 - 44 U/L 27   CBC Latest Ref Rng & Units 10/10/2020  WBC 4.0 - 10.5 K/uL 6.4  Hemoglobin 13.0 - 17.0 g/dL 13.1  Hematocrit 39.0 - 52.0 % 40.5  Platelets 150 - 400 K/uL 95(L)     Assessment and plan- Patient is a 65 y.o. 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    He is here for on treatment assessment prior to cycle 8 of FOLFOX chemotherapy with trastuzumab  Counts okay to proceed with cycle 8 of 5-FU trastuzumab today.  Pump DC on day 3 and receives [place].  Given that his platelets are less than 100 I will hold off on giving him oxaliplatin today.  He has repeat CT chest abdomen and pelvis with contrast doing a couple of days.  I will see him back in 2 weeks with CBC with differential and CMP for cycle 9 of FOLFOX chemotherapy with trastuzumab.  He will receive Keytruda with cycle 10  Patient also has a repeat echocardiogram scheduled this month.   Visit Diagnosis 1. Encounter for antineoplastic chemotherapy   2. Encounter for monoclonal antibody treatment for malignancy   3. Esophageal adenocarcinoma ([place])      Dr. Randa Evens, MD, MPH [facility] [phone] 10/10/2020 9:20 AM

## 2020-10-10 NOTE — Progress Notes (Signed)
Platelets 95. Per Dr. [name], patient is to proceed with treatment but is to not receive Oxaliplatin today.

## 2020-10-10 NOTE — Addendum Note (Signed)
Addended by: Kern Alberta on: 10/10/2020 03:33 PM   Modules accepted: Orders

## 2020-10-11 ENCOUNTER — Other Ambulatory Visit: Payer: Self-pay

## 2020-10-11 ENCOUNTER — Ambulatory Visit
Admission: RE | Admit: 2020-10-11 | Discharge: 2020-10-11 | Disposition: A | Discharge status: Home / Self Care | Payer: Medicare Other | Source: Ambulatory Visit | Attending: Oncology | Admitting: Oncology

## 2020-10-11 DIAGNOSIS — Z5111 Encounter for antineoplastic chemotherapy: Secondary | ICD-10-CM | POA: Diagnosis present

## 2020-10-11 DIAGNOSIS — C159 Malignant neoplasm of esophagus, unspecified: Secondary | ICD-10-CM | POA: Insufficient documentation

## 2020-10-11 LAB — CEA: CEA: 5.2 ng/mL — ABNORMAL HIGH (ref 0.0–4.7)

## 2020-10-11 MED ORDER — IOHEXOL 300 MG/ML  SOLN
100.0000 mL | Freq: Once | INTRAMUSCULAR | Status: AC | PRN
  Administered 2020-10-11: 100 mL via INTRAVENOUS

## 2020-10-12 ENCOUNTER — Inpatient Hospital Stay: Payer: Medicare Other

## 2020-10-12 VITALS — BP 153/76 | HR 67 | Temp 97.0°F | Resp 16

## 2020-10-12 DIAGNOSIS — C159 Malignant neoplasm of esophagus, unspecified: Secondary | ICD-10-CM

## 2020-10-12 DIAGNOSIS — Z5112 Encounter for antineoplastic immunotherapy: Secondary | ICD-10-CM | POA: Diagnosis not present

## 2020-10-12 MED ORDER — HEPARIN SOD (PORK) LOCK FLUSH 100 UNIT/ML IV SOLN
INTRAVENOUS | Status: AC
  Filled 2020-10-12: qty 5

## 2020-10-12 MED ORDER — PEGFILGRASTIM-CBQV 6 MG/0.6ML ~~LOC~~ SOSY
6.0000 mg | PREFILLED_SYRINGE | Freq: Once | SUBCUTANEOUS | Status: AC
  Administered 2020-10-12: 6 mg via SUBCUTANEOUS
  Filled 2020-10-12: qty 0.6

## 2020-10-12 MED ORDER — HEPARIN SOD (PORK) LOCK FLUSH 100 UNIT/ML IV SOLN
500.0000 [IU] | Freq: Once | INTRAVENOUS | Status: AC | PRN
  Administered 2020-10-12: 500 [IU]
  Filled 2020-10-12: qty 5

## 2020-10-16 ENCOUNTER — Ambulatory Visit
Admission: RE | Admit: 2020-10-16 | Discharge: 2020-10-16 | Disposition: A | Discharge status: Home / Self Care | Payer: Medicare Other | Source: Ambulatory Visit | Attending: Oncology | Admitting: Oncology

## 2020-10-16 ENCOUNTER — Other Ambulatory Visit: Payer: Self-pay

## 2020-10-16 DIAGNOSIS — C159 Malignant neoplasm of esophagus, unspecified: Secondary | ICD-10-CM | POA: Diagnosis not present

## 2020-10-16 DIAGNOSIS — E119 Type 2 diabetes mellitus without complications: Secondary | ICD-10-CM | POA: Insufficient documentation

## 2020-10-16 DIAGNOSIS — I080 Rheumatic disorders of both mitral and aortic valves: Secondary | ICD-10-CM | POA: Insufficient documentation

## 2020-10-16 DIAGNOSIS — I10 Essential (primary) hypertension: Secondary | ICD-10-CM | POA: Diagnosis not present

## 2020-10-16 DIAGNOSIS — Z0189 Encounter for other specified special examinations: Secondary | ICD-10-CM | POA: Diagnosis not present

## 2020-10-16 DIAGNOSIS — Z5112 Encounter for antineoplastic immunotherapy: Secondary | ICD-10-CM

## 2020-10-16 DIAGNOSIS — Z5111 Encounter for antineoplastic chemotherapy: Secondary | ICD-10-CM | POA: Diagnosis not present

## 2020-10-16 LAB — ECHOCARDIOGRAM COMPLETE
AR max vel: 2.38 cm2
AV Area VTI: 2.58 cm2
AV Area mean vel: 2.26 cm2
AV Mean grad: 4 mmHg
AV Peak grad: 6.5 mmHg
Ao pk vel: 1.27 m/s
Area-P 1/2: 3.05 cm2
S' Lateral: 3.77 cm

## 2020-10-16 NOTE — Progress Notes (Signed)
*  PRELIMINARY RESULTS* Echocardiogram 2D Echocardiogram has been performed.  Cory [name] 10/16/2020, 10:21 AM

## 2020-10-23 ENCOUNTER — Other Ambulatory Visit: Payer: Self-pay | Admitting: Internal Medicine

## 2020-10-24 ENCOUNTER — Inpatient Hospital Stay (HOSPITAL_BASED_OUTPATIENT_CLINIC_OR_DEPARTMENT_OTHER): Payer: Medicare Other | Admitting: Oncology

## 2020-10-24 ENCOUNTER — Inpatient Hospital Stay: Payer: Medicare Other

## 2020-10-24 ENCOUNTER — Encounter: Payer: Self-pay | Admitting: Oncology

## 2020-10-24 VITALS — BP 130/69 | HR 67 | Temp 97.8°F | Resp 16 | Ht 71.0 in | Wt 198.0 lb

## 2020-10-24 DIAGNOSIS — Z5112 Encounter for antineoplastic immunotherapy: Secondary | ICD-10-CM

## 2020-10-24 DIAGNOSIS — C7989 Secondary malignant neoplasm of other specified sites: Secondary | ICD-10-CM

## 2020-10-24 DIAGNOSIS — C159 Malignant neoplasm of esophagus, unspecified: Secondary | ICD-10-CM

## 2020-10-24 DIAGNOSIS — Z5111 Encounter for antineoplastic chemotherapy: Secondary | ICD-10-CM

## 2020-10-24 LAB — COMPREHENSIVE METABOLIC PANEL
ALT: 23 U/L (ref 0–44)
AST: 29 U/L (ref 15–41)
Albumin: 3.7 g/dL (ref 3.5–5.0)
Alkaline Phosphatase: 143 U/L — ABNORMAL HIGH (ref 38–126)
Anion gap: 7 (ref 5–15)
BUN: 12 mg/dL (ref 8–23)
CO2: 24 mmol/L (ref 22–32)
Calcium: 8.9 mg/dL (ref 8.9–10.3)
Chloride: 108 mmol/L (ref 98–111)
Creatinine, Ser: 0.96 mg/dL (ref 0.61–1.24)
GFR, Estimated: >60 mL/min (ref >60)
Glucose, Bld: 132 mg/dL — ABNORMAL HIGH (ref 70–99)
Potassium: 3.8 mmol/L (ref 3.5–5.1)
Sodium: 139 mmol/L (ref 135–145)
Total Bilirubin: 1 mg/dL (ref 0.3–1.2)
Total Protein: 6.4 g/dL — ABNORMAL LOW (ref 6.5–8.1)

## 2020-10-24 LAB — CBC WITH DIFFERENTIAL/PLATELET
Abs Immature Granulocytes: 0.05 10*3/uL (ref 0.00–0.07)
Basophils Absolute: 0 10*3/uL (ref 0.0–0.1)
Basophils Relative: 1 %
Eosinophils Absolute: 0.5 10*3/uL (ref 0.0–0.5)
Eosinophils Relative: 8 %
HCT: 39.9 % (ref 39.0–52.0)
Hemoglobin: 12.7 g/dL — ABNORMAL LOW (ref 13.0–17.0)
Immature Granulocytes: 1 %
Lymphocytes Relative: 12 %
Lymphs Abs: 0.8 10*3/uL (ref 0.7–4.0)
MCH: 29.6 pg (ref 26.0–34.0)
MCHC: 31.8 g/dL (ref 30.0–36.0)
MCV: 93 fL (ref 80.0–100.0)
Monocytes Absolute: 0.5 10*3/uL (ref 0.1–1.0)
Monocytes Relative: 8 %
Neutro Abs: 4.9 10*3/uL (ref 1.7–7.7)
Neutrophils Relative %: 70 %
Platelets: 134 10*3/uL — ABNORMAL LOW (ref 150–400)
RBC: 4.29 MIL/uL (ref 4.22–5.81)
RDW: 17.2 % — ABNORMAL HIGH (ref 11.5–15.5)
WBC: 6.8 10*3/uL (ref 4.0–10.5)
nRBC: 0 % (ref 0.0–0.2)

## 2020-10-24 LAB — TSH: TSH: 1.688 u[IU]/mL (ref 0.350–4.500)

## 2020-10-24 MED ORDER — FLUOROURACIL CHEMO INJECTION 2.5 GM/50ML
400.0000 mg/m2 | Freq: Once | INTRAVENOUS | Status: AC
  Administered 2020-10-24: 850 mg via INTRAVENOUS
  Filled 2020-10-24: qty 17

## 2020-10-24 MED ORDER — SODIUM CHLORIDE 0.9 % IV SOLN
5000.0000 mg | INTRAVENOUS | Status: DC
  Administered 2020-10-24: 5000 mg via INTRAVENOUS
  Filled 2020-10-24: qty 100

## 2020-10-24 MED ORDER — SODIUM CHLORIDE 0.9% FLUSH
10.0000 mL | INTRAVENOUS | Status: DC | PRN
  Administered 2020-10-24: 10 mL via INTRAVENOUS
  Filled 2020-10-24: qty 10

## 2020-10-24 MED ORDER — LEUCOVORIN CALCIUM INJECTION 350 MG
850.0000 mg | Freq: Once | INTRAVENOUS | Status: AC
  Administered 2020-10-24: 850 mg via INTRAVENOUS
  Filled 2020-10-24: qty 42.5

## 2020-10-24 MED ORDER — PALONOSETRON HCL INJECTION 0.25 MG/5ML
0.2500 mg | Freq: Once | INTRAVENOUS | Status: AC
  Administered 2020-10-24: 0.25 mg via INTRAVENOUS
  Filled 2020-10-24: qty 5

## 2020-10-24 MED ORDER — TRASTUZUMAB-ANNS CHEMO 150 MG IV SOLR
4.0000 mg/kg | Freq: Once | INTRAVENOUS | Status: AC
  Administered 2020-10-24: 357 mg via INTRAVENOUS
  Filled 2020-10-24: qty 17

## 2020-10-24 MED ORDER — DEXTROSE 5 % IV SOLN
Freq: Once | INTRAVENOUS | Status: AC
  Filled 2020-10-24: qty 250

## 2020-10-24 MED ORDER — SODIUM CHLORIDE 0.9 % IV SOLN
Freq: Once | INTRAVENOUS | Status: AC
  Filled 2020-10-24: qty 250

## 2020-10-24 MED ORDER — SODIUM CHLORIDE 0.9 % IV SOLN
10.0000 mg | Freq: Once | INTRAVENOUS | Status: AC
  Administered 2020-10-24: 10 mg via INTRAVENOUS
  Filled 2020-10-24: qty 10

## 2020-10-24 MED ORDER — DIPHENHYDRAMINE HCL 50 MG/ML IJ SOLN
50.0000 mg | Freq: Once | INTRAMUSCULAR | Status: AC
  Administered 2020-10-24: 50 mg via INTRAVENOUS
  Filled 2020-10-24: qty 1

## 2020-10-24 MED ORDER — ACETAMINOPHEN 325 MG PO TABS
650.0000 mg | ORAL_TABLET | Freq: Once | ORAL | Status: AC
  Administered 2020-10-24: 650 mg via ORAL
  Filled 2020-10-24: qty 2

## 2020-10-24 MED ORDER — OXALIPLATIN CHEMO INJECTION 100 MG/20ML
65.0000 mg/m2 | Freq: Once | INTRAVENOUS | Status: AC
  Administered 2020-10-24: 140 mg via INTRAVENOUS
  Filled 2020-10-24: qty 20

## 2020-10-24 NOTE — Progress Notes (Signed)
Pt tries to drink water and gatorade and sometimes gets busy and does not get enough in. He says sometimes he gets dizzy from sitting to standing and bent over one time to pick something up and felt dizzy. He will set his phone to remind him to drink water so that time does not go by and pt has not drank enough.

## 2020-10-24 NOTE — Progress Notes (Signed)
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 Tobi Bastos which showed a large nearly obstructing mass in the lower third of the esophagus 35 cm from incisors. This was biopsied and was consistent with adenocarcinoma.HER-2 positive. PD-l1 <1 %, TMB HIGH  Presently patient reports dysphagia but is able to eat cereal or soft food. He has lost about 13 pounds in the last month itself. Denies any significant pain. He recently retired after many years of service and is otherwise independent of his ADLs and IADLs.  PET CT scan on 06/26/2020 showed circumferential distal esophageal mass with an SUV of 13.3 extending over 5.5 cm. Right lower paratracheal node is 0.6 cm with an SUV of 3.1. Hypermetabolic right gastric lymph nodes including 1.1 cm node with an SUV of 5.8. Numerous hypermetabolic lesions throughout the liver somewhat confluent around the periphery which were hypermetabolic between 8 and 9. Faintly hypermetabolic left adrenal gland  NGS testing showed high tumor mutational burden CPS score less than 1.CD9-NRG1 fusion.BRAF 581L, CCN E1 gain. EMLA for fusion of BX [id number], FGF 23 gain, FGF 6 gain, MET R988C, T p53 S1 29F.ERBB2 gain but no other actionable mutations  Patient currently on first-line FOLFOX/trastuzumab/Keytruda  Interval  history-patient reports doing well presently.  He reports occasional dizziness and sometimes does not keep up with his fluid intake.  Denies any difficulty swallowing.  ECOG PS- 1 Pain scale- 0   Review of systems- Review of Systems  Constitutional: Negative for chills, fever, malaise/fatigue and weight loss.  HENT: Negative for congestion, ear discharge and nosebleeds.   Eyes: Negative for blurred vision.  Respiratory: Negative for cough, hemoptysis, sputum production, shortness of breath and wheezing.   Cardiovascular: Negative for chest pain, palpitations, orthopnea and claudication.  Gastrointestinal: Negative for abdominal pain, blood in stool, constipation, diarrhea, heartburn, melena, nausea and vomiting.  Genitourinary: Negative for dysuria, flank pain, frequency, hematuria and urgency.  Musculoskeletal: Negative for back pain, joint pain and myalgias.  Skin: Negative for rash.  Neurological: Negative for dizziness, tingling, focal weakness, seizures, weakness and headaches.  Endo/Heme/Allergies: Does not bruise/bleed easily.  Psychiatric/Behavioral: Negative for depression and suicidal ideas. The patient does not have insomnia.       No Known Allergies   Past Medical History:  Diagnosis Date  . Esophageal adenocarcinoma (HCC) 06/23/2020  . Family history of brain cancer   . Family history of colon cancer   . Family history of melanoma   . Family history of stomach cancer   . GERD (gastroesophageal reflux disease)   . History of kidney stones   . Nephrolithiasis    [facility]  . Pancreatitis, acute   . Personal history of colonic polyps    Dr Maryruth Bun  . Psoriasis      Past Surgical History:  Procedure Laterality Date  . COLONOSCOPY    . COLONOSCOPY WITH PROPOFOL N/A 03/31/2020   Procedure: COLONOSCOPY WITH PROPOFOL;  Surgeon:  Wyline Mood, MD;  Location: [facility] ENDOSCOPY;  Service: Gastroenterology;  Laterality: N/A;  . ERCP    . ESOPHAGOGASTRODUODENOSCOPY (EGD) WITH  PROPOFOL N/A 06/19/2020   Procedure: ESOPHAGOGASTRODUODENOSCOPY (EGD) WITH PROPOFOL;  Surgeon: Wyline Mood, MD;  Location: [facility] ENDOSCOPY;  Service: Gastroenterology;  Laterality: N/A;  . groin surgery     as a child  . PORTA CATH INSERTION N/A 06/28/2020   Procedure: PORTA CATH INSERTION;  Surgeon: Annice Needy, MD;  Location: [facility] INVASIVE CV LAB;  Service: Cardiovascular;  Laterality: N/A;  . SHOULDER SURGERY      Social History   Socioeconomic History  . Marital status: Married    Spouse name: Not on file  . Number of children: 2  . Years of education: Not on file  . Highest education level: Not on file  Occupational History  . 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 Smoking status: Former Smoker    Types: Cigarettes    Quit date: 07/08/1988    Years since quitting: 32.3  . Smokeless tobacco: Never Used  Vaping Use  . Vaping Use: Never used  Substance and Sexual Activity  . Alcohol use: No    Comment: recovering alcoholic for 13 years  . Drug use: No  . Sexual activity: Not Currently  Other Topics Concern  . Not on file  Social History Narrative   ** Merged History Encounter **       Social Determinants of Health   Financial Resource Strain: Not on file  Food Insecurity: Not on file  Transportation Needs: Not on file  Physical Activity: Not on file  Stress: Not on file  Social Connections: Not on file  Intimate Partner Violence: Not on file    Family History  Problem Relation Age of Onset  . Stomach cancer Mother   . Diabetes Brother   . Hypertension Brother   . Hypertension Father   . Prostate cancer Father   . Colon cancer Sister   . Cancer Other        either cervical or ovarian, d. 40s  . Brain cancer Sister      Current Outpatient Medications:  .  dexamethasone (DECADRON) 4 MG tablet, Take 2 tablets (8 mg total) by mouth daily. Start the day after chemotherapy for 2 days. Take with food., Disp: 30 tablet,  Rfl: 1 .  lidocaine-prilocaine (EMLA) cream, Apply 1 application topically as needed. Apply small amount to port site at least 1 hour prior to it being accessed, cover with plastic wrap, Disp: 30 g, Rfl: 3 .  LORazepam (ATIVAN) 0.5 MG tablet, Take 1 tablet (0.5 mg total) by mouth every 6 (six) hours as needed (Nausea or vomiting). (Patient not taking: No sig reported), Disp: 30 tablet, Rfl: 0 .  ondansetron (ZOFRAN) 8 MG tablet, Take 1 tablet (8 mg total) by mouth 2 (two) times daily as needed for refractory nausea / vomiting. Start on day 3 after chemotherapy. (Patient not taking: No sig reported), Disp: 30 tablet, Rfl: 1 .  oxyCODONE (OXY IR/ROXICODONE) 5 MG immediate release tablet, Take 1 tablet (5 mg total) by mouth every 8 (eight) hours as needed for severe pain. (Patient not taking: No sig reported), Disp: 60 tablet, Rfl: 0 .  pantoprazole (PROTONIX) 40 MG tablet, TAKE 1 TABLET BY MOUTH TWICE A DAY BEFORE MEALS, Disp: 60 tablet, Rfl: 3 .  prochlorperazine (COMPAZINE) 10 MG tablet, Take 1 tablet (10 mg total) by mouth every  6 (six) hours as needed (Nausea or vomiting). (Patient not taking: No sig reported), Disp: 30 tablet, Rfl: 1 .  sucralfate (CARAFATE) 1 g tablet, Take 1 tablet (1 g total) by mouth 2 (two) times daily. (Patient not taking: No sig reported), Disp: 180 tablet, Rfl: 1 No current facility-administered medications for this visit.  Facility-Administered Medications Ordered in Other Visits:  .  sodium chloride flush (NS) 0.9 % injection 10 mL, 10 mL, Intravenous, PRN, Creig Hines, MD, 10 mL at 10/24/20 1610  Physical exam: There were no vitals filed for this visit. Physical Exam Cardiovascular:     Rate and Rhythm: Normal rate.  Pulmonary:     Effort: Pulmonary effort is normal.  Skin:    General: Skin is warm and dry.  Neurological:     Mental Status: He is alert and oriented to person, place, and time.      CMP Latest Ref Rng & Units 10/24/2020  Glucose 70 - 99 mg/dL  960(A)  BUN 8 - 23 mg/dL 12  Creatinine 5.40 - 9.81 mg/dL 1.91  Sodium 478 - 295 mmol/L 139  Potassium 3.5 - 5.1 mmol/L 3.8  Chloride 98 - 111 mmol/L 108  CO2 22 - 32 mmol/L 24  Calcium 8.9 - 10.3 mg/dL 8.9  Total Protein 6.5 - 8.1 g/dL 6.4(L)  Total Bilirubin 0.3 - 1.2 mg/dL 1.0  Alkaline Phos 38 - 126 U/L 143(H)  AST 15 - 41 U/L 29  ALT 0 - 44 U/L 23   CBC Latest Ref Rng & Units 10/24/2020  WBC 4.0 - 10.5 K/uL 6.8  Hemoglobin 13.0 - 17.0 g/dL 12.7(L)  Hematocrit 39.0 - 52.0 % 39.9  Platelets 150 - 400 K/uL 134(L)    No images are attached to the encounter.  CT CHEST ABDOMEN PELVIS W CONTRAST  Result Date: 10/12/2020 CLINICAL DATA:  Follow-up metastatic esophageal carcinoma. EXAM: CT CHEST, ABDOMEN, AND PELVIS WITH CONTRAST TECHNIQUE: Multidetector CT imaging of the chest, abdomen and pelvis was performed following the standard protocol during bolus administration of intravenous contrast. CONTRAST:  OMNIPAQUE IOHEXOL 300 MG/ML  SOLN COMPARISON:  PET-CT on 06/26/2020 FINDINGS: CT CHEST FINDINGS Cardiovascular: No acute findings. Aortic atherosclerotic calcification noted. Mediastinum/Lymph Nodes: Mild diffuse esophageal wall thickening is seen, however previously seen masslike wall thickening in the distal thoracic esophagus has nearly completely resolved. No pathologically enlarged lymph nodes identified. Lungs/Pleura: No suspicious pulmonary nodules or masses identified. No evidence of infiltrate or pleural effusion. Musculoskeletal:  No suspicious bone lesions identified. CT ABDOMEN AND PELVIS FINDINGS Hepatobiliary: Numerous small hypovascular masses are seen throughout the liver, consistent with diffuse liver metastases. Index lesion in the medial segment the left lobe measures 3.2 x 2.5 cm on image 63/2. These correspond to areas of hypermetabolism on previous PET scan, however the size and number of individual lesions cannot be compared to noncontrast CT images on prior PET as  they were not well visualized. 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 Gallbladder is unremarkable. No evidence of biliary ductal dilatation. Pancreas:  No mass or inflammatory changes. Spleen:  Within normal limits in size and appearance. Adrenals/Urinary tract: No masses identified. A few tiny less than 5 mm renal calculi are again seen bilaterally. No evidence of ureteral calculi or hydronephrosis. Stomach/Bowel: No evidence of obstruction, inflammatory process, or abnormal fluid collections. Vascular/Lymphatic: There has been interval resolution of previously seen lymphadenopathy in the gastrohepatic ligament. No pathologically enlarged lymph nodes identified  elsewhere within the abdomen or pelvis. No abdominal aortic aneurysm. Aortic atherosclerotic calcification noted. Reproductive:  No mass or other significant abnormality identified. Other:  None. Musculoskeletal:  No suspicious bone lesions identified. IMPRESSION: Near complete resolution of distal esophageal mass. Interval resolution of gastrohepatic ligament lymphadenopathy. Diffuse liver metastases. Comparison with previous PET-CT is limited due to poor visualization of individual lesions on previous noncontrast PET-CT images. No new sites of metastatic disease identified. Bilateral nephrolithiasis. No evidence of ureteral calculi or hydronephrosis. Aortic Atherosclerosis (ICD10-I70.0). Electronically Signed   By: Danae Orleans M.D.   On: 10/12/2020 17:19   ECHOCARDIOGRAM COMPLETE  Result Date: 10/16/2020    ECHOCARDIOGRAM REPORT   Patient Name:   JAMAUL FENDLEY Date of Exam: 10/16/2020 Medical Rec #:  [id number]      Height:       71.0 in Accession #:    [id number]     Weight:       193.3 lb Date of Birth:  January 23, 1956     BSA:          2.078 m Patient Age:    64 years       BP:           153/76 mmHg Patient Gender: M              HR:           67 bpm. Exam Location:  [facility]  Procedure: Cardiac Doppler, Color Doppler, 2D Echo and Strain Analysis Indications:     C15.9 Esophageal adenocarcinoma                  Z51.12 Encounter for antineoplastic chemotherapy  History:         Patient has prior history of Echocardiogram examinations, most                  recent 07/17/2020. Risk Factors:Hypertension and Diabetes.  Sonographer:     Cristela Blue RDCS (AE) Referring Phys:  [id number] Pete Glatter Korbyn Vanes Diagnosing Phys: Yvonne Kendall MD  Sonographer Comments: Global longitudinal strain was attempted. IMPRESSIONS  1. Left ventricular ejection fraction, by estimation, is 55 to 60%. The left ventricle has normal function. The left ventricle has no regional wall motion abnormalities. Left ventricular diastolic parameters are consistent with Grade I diastolic dysfunction (impaired relaxation). The average left ventricular global longitudinal strain is -11.7 %. The global longitudinal strain is abnormal.  2. Right ventricular systolic function is normal. The right ventricular size is normal.  3. The mitral valve is normal in structure. Mild to moderate mitral valve regurgitation. No evidence of mitral stenosis.  4. The aortic valve was not well visualized. Aortic valve regurgitation is not visualized. No aortic stenosis is present.  5. Aortic dilatation noted. There is borderline dilatation of the aortic root, measuring 38 mm. FINDINGS  Left Ventricle: Left ventricular ejection fraction, by estimation, is 55 to 60%. The left ventricle has normal function. The left ventricle has no regional wall motion abnormalities. The average left ventricular global longitudinal strain is -11.7 %. The global longitudinal strain is abnormal. The left ventricular internal cavity size was normal in size. There is borderline left ventricular hypertrophy. Left ventricular diastolic parameters are consistent with Grade I diastolic dysfunction (impaired relaxation). Right Ventricle: The right ventricular size is normal. No  increase in right ventricular wall thickness. Right ventricular systolic function is normal. Left Atrium: Left atrial size was normal in size. Right Atrium: Right atrial size was normal in size. Pericardium: There  is no evidence of pericardial effusion. Mitral Valve: The mitral valve is normal in structure. Mild to moderate mitral valve regurgitation. No evidence of mitral valve stenosis. Tricuspid Valve: The tricuspid valve is not well visualized. Tricuspid valve regurgitation is trivial. Aortic Valve: The aortic valve was not well visualized. Aortic valve regurgitation is not visualized. No aortic stenosis is present. Aortic valve mean gradient measures 4.0 mmHg. Aortic valve peak gradient measures 6.5 mmHg. Aortic valve area, by VTI measures 2.58 cm. Pulmonic Valve: The pulmonic valve was not well visualized. Pulmonic valve regurgitation is not visualized. No evidence of pulmonic stenosis. Aorta: Aortic dilatation noted. There is borderline dilatation of the aortic root, measuring 38 mm. Pulmonary Artery: The pulmonary artery is not well seen. IAS/Shunts: The interatrial septum was not well visualized.  LEFT VENTRICLE PLAX 2D LVIDd:         5.32 cm  Diastology LVIDs:         3.77 cm  LV e' medial:    5.98 cm/s LV PW:         1.05 cm  LV E/e' medial:  14.2 LV IVS:        0.87 cm  LV e' lateral:   9.46 cm/s LVOT diam:     2.20 cm  LV E/e' lateral: 9.0 LV SV:         62 LV SV Index:   30       2D Longitudinal Strain LVOT Area:     3.80 cm 2D Strain GLS Avg:     -11.7 %  RIGHT VENTRICLE RV Basal diam:  3.90 cm RV S prime:     16.10 cm/s TAPSE (M-mode): 3.2 cm LEFT ATRIUM             Index       RIGHT ATRIUM           Index LA diam:        2.80 cm 1.35 cm/m  RA Area:     16.90 cm LA Vol (A2C):   71.5 ml 34.40 ml/m RA Volume:   42.70 ml  20.55 ml/m LA Vol (A4C):   43.6 ml 20.98 ml/m LA Biplane Vol: 60.9 ml 29.30 ml/m  AORTIC VALVE                   PULMONIC VALVE AV Area (Vmax):    2.38 cm    PV Vmax:         0.76 m/s AV Area (Vmean):   2.26 cm    PV Peak grad:   2.3 mmHg AV Area (VTI):     2.58 cm    RVOT Peak grad: 4 mmHg AV Vmax:           127.00 cm/s AV Vmean:          87.100 cm/s AV VTI:            0.239 m AV Peak Grad:      6.5 mmHg AV Mean Grad:      4.0 mmHg LVOT Vmax:         79.60 cm/s LVOT Vmean:        51.800 cm/s LVOT VTI:          0.162 m LVOT/AV VTI ratio: 0.68  AORTA Ao Root diam: 3.60 cm MITRAL VALVE                TRICUSPID VALVE MV Area (PHT): 3.05 cm     TR Peak grad:  42.0 mmHg MV Decel Time: 249 msec     TR Vmax:        324.00 cm/s MV E velocity: 84.80 cm/s MV A velocity: 107.00 cm/s  SHUNTS MV E/A ratio:  0.79         Systemic VTI:  0.16 m                             Systemic Diam: 2.20 cm Yvonne Kendall MD Electronically signed by Yvonne Kendall MD Signature Date/Time: 10/16/2020/1:10:13 PM    Final      Assessment and plan- Patient is a 65 y.o. 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    Is here for on treatment assessment prior to cycle 9 of FOLFOX chemotherapy with trastuzumab  Counts okay to proceed with cycle 9 of FOLFOX chemotherapy with trastuzumab today.  I will see him back in 2 weeks for cycle 10 and he will get Keytruda at that time as well.I have reviewed CT chest abdomen and pelvis images independently and discussed findings with the patient.  Masslike thickening in the lower thoracic esophagus has resolved.  Multiple liver metastases were still seen but the actual number and size could not be compared to prior PET scan.  Resolution of previously seen adenopathy in the gastrohepatic ligament.  No new areas of metastatic disease.  Overall this indicates good response to treatment so far.  Also his CEA which was previously elevated at 547 is now down to 5.2.  Plan is to continue FOLFOX trastuzumab Keytruda until progression or toxicity.   Visit Diagnosis 1. Encounter for antineoplastic chemotherapy   2. Encounter for  monoclonal antibody treatment for malignancy   3. Esophageal adenocarcinoma (HCC)      Dr. Owens Shark, MD, MPH [facility] [phone] 10/24/2020 1:18 PM

## 2020-10-26 ENCOUNTER — Inpatient Hospital Stay: Payer: Medicare Other

## 2020-10-26 DIAGNOSIS — Z5112 Encounter for antineoplastic immunotherapy: Secondary | ICD-10-CM | POA: Diagnosis not present

## 2020-10-26 DIAGNOSIS — C159 Malignant neoplasm of esophagus, unspecified: Secondary | ICD-10-CM

## 2020-10-26 MED ORDER — SODIUM CHLORIDE 0.9% FLUSH
10.0000 mL | INTRAVENOUS | Status: DC | PRN
  Administered 2020-10-26: 10 mL
  Filled 2020-10-26: qty 10

## 2020-10-26 MED ORDER — HEPARIN SOD (PORK) LOCK FLUSH 100 UNIT/ML IV SOLN
500.0000 [IU] | Freq: Once | INTRAVENOUS | Status: AC | PRN
  Administered 2020-10-26: 500 [IU]
  Filled 2020-10-26: qty 5

## 2020-10-26 MED ORDER — PEGFILGRASTIM-CBQV 6 MG/0.6ML ~~LOC~~ SOSY
6.0000 mg | PREFILLED_SYRINGE | Freq: Once | SUBCUTANEOUS | Status: AC
  Administered 2020-10-26: 6 mg via SUBCUTANEOUS
  Filled 2020-10-26: qty 0.6

## 2020-11-07 ENCOUNTER — Inpatient Hospital Stay (HOSPITAL_BASED_OUTPATIENT_CLINIC_OR_DEPARTMENT_OTHER): Payer: Medicare Other | Admitting: Oncology

## 2020-11-07 ENCOUNTER — Encounter: Payer: Self-pay | Admitting: Oncology

## 2020-11-07 ENCOUNTER — Inpatient Hospital Stay: Payer: Medicare Other

## 2020-11-07 ENCOUNTER — Other Ambulatory Visit: Payer: Self-pay

## 2020-11-07 ENCOUNTER — Inpatient Hospital Stay: Payer: Medicare Other | Attending: Oncology

## 2020-11-07 VITALS — BP 144/73 | HR 72 | Temp 97.8°F | Resp 16 | Ht 71.0 in | Wt 197.6 lb

## 2020-11-07 DIAGNOSIS — C787 Secondary malignant neoplasm of liver and intrahepatic bile duct: Secondary | ICD-10-CM | POA: Diagnosis not present

## 2020-11-07 DIAGNOSIS — Z8042 Family history of malignant neoplasm of prostate: Secondary | ICD-10-CM | POA: Insufficient documentation

## 2020-11-07 DIAGNOSIS — T451X5A Adverse effect of antineoplastic and immunosuppressive drugs, initial encounter: Secondary | ICD-10-CM | POA: Insufficient documentation

## 2020-11-07 DIAGNOSIS — Z833 Family history of diabetes mellitus: Secondary | ICD-10-CM | POA: Insufficient documentation

## 2020-11-07 DIAGNOSIS — G620 Drug-induced polyneuropathy: Secondary | ICD-10-CM

## 2020-11-07 DIAGNOSIS — Z5112 Encounter for antineoplastic immunotherapy: Secondary | ICD-10-CM | POA: Diagnosis not present

## 2020-11-07 DIAGNOSIS — Z87891 Personal history of nicotine dependence: Secondary | ICD-10-CM | POA: Diagnosis not present

## 2020-11-07 DIAGNOSIS — Z5189 Encounter for other specified aftercare: Secondary | ICD-10-CM | POA: Insufficient documentation

## 2020-11-07 DIAGNOSIS — I700 Atherosclerosis of aorta: Secondary | ICD-10-CM | POA: Diagnosis not present

## 2020-11-07 DIAGNOSIS — Z800 Family history of malignant neoplasm of digestive organs: Secondary | ICD-10-CM | POA: Diagnosis not present

## 2020-11-07 DIAGNOSIS — Z8249 Family history of ischemic heart disease and other diseases of the circulatory system: Secondary | ICD-10-CM | POA: Diagnosis not present

## 2020-11-07 DIAGNOSIS — C159 Malignant neoplasm of esophagus, unspecified: Secondary | ICD-10-CM | POA: Diagnosis not present

## 2020-11-07 DIAGNOSIS — R1310 Dysphagia, unspecified: Secondary | ICD-10-CM | POA: Insufficient documentation

## 2020-11-07 DIAGNOSIS — Z5111 Encounter for antineoplastic chemotherapy: Secondary | ICD-10-CM | POA: Diagnosis not present

## 2020-11-07 DIAGNOSIS — D696 Thrombocytopenia, unspecified: Secondary | ICD-10-CM | POA: Insufficient documentation

## 2020-11-07 DIAGNOSIS — C160 Malignant neoplasm of cardia: Secondary | ICD-10-CM | POA: Insufficient documentation

## 2020-11-07 DIAGNOSIS — Z79899 Other long term (current) drug therapy: Secondary | ICD-10-CM | POA: Insufficient documentation

## 2020-11-07 DIAGNOSIS — Z808 Family history of malignant neoplasm of other organs or systems: Secondary | ICD-10-CM | POA: Insufficient documentation

## 2020-11-07 LAB — CBC WITH DIFFERENTIAL/PLATELET
Abs Immature Granulocytes: 0.05 10*3/uL (ref 0.00–0.07)
Basophils Absolute: 0.1 10*3/uL (ref 0.0–0.1)
Basophils Relative: 1 %
Eosinophils Absolute: 0.3 10*3/uL (ref 0.0–0.5)
Eosinophils Relative: 5 %
HCT: 40.7 % (ref 39.0–52.0)
Hemoglobin: 13.3 g/dL (ref 13.0–17.0)
Immature Granulocytes: 1 %
Lymphocytes Relative: 17 %
Lymphs Abs: 1 10*3/uL (ref 0.7–4.0)
MCH: 30.6 pg (ref 26.0–34.0)
MCHC: 32.7 g/dL (ref 30.0–36.0)
MCV: 93.8 fL (ref 80.0–100.0)
Monocytes Absolute: 0.6 10*3/uL (ref 0.1–1.0)
Monocytes Relative: 10 %
Neutro Abs: 4 10*3/uL (ref 1.7–7.7)
Neutrophils Relative %: 66 %
Platelets: 110 10*3/uL — ABNORMAL LOW (ref 150–400)
RBC: 4.34 MIL/uL (ref 4.22–5.81)
RDW: 15.8 % — ABNORMAL HIGH (ref 11.5–15.5)
WBC: 6 10*3/uL (ref 4.0–10.5)
nRBC: 0 % (ref 0.0–0.2)

## 2020-11-07 LAB — COMPREHENSIVE METABOLIC PANEL
ALT: 27 U/L (ref 0–44)
AST: 31 U/L (ref 15–41)
Albumin: 3.9 g/dL (ref 3.5–5.0)
Alkaline Phosphatase: 143 U/L — ABNORMAL HIGH (ref 38–126)
Anion gap: 10 (ref 5–15)
BUN: 14 mg/dL (ref 8–23)
CO2: 23 mmol/L (ref 22–32)
Calcium: 9 mg/dL (ref 8.9–10.3)
Chloride: 104 mmol/L (ref 98–111)
Creatinine, Ser: 0.93 mg/dL (ref 0.61–1.24)
GFR, Estimated: >60 mL/min (ref >60)
Glucose, Bld: 140 mg/dL — ABNORMAL HIGH (ref 70–99)
Potassium: 3.8 mmol/L (ref 3.5–5.1)
Sodium: 137 mmol/L (ref 135–145)
Total Bilirubin: 0.9 mg/dL (ref 0.3–1.2)
Total Protein: 6.7 g/dL (ref 6.5–8.1)

## 2020-11-07 MED ORDER — HEPARIN SOD (PORK) LOCK FLUSH 100 UNIT/ML IV SOLN
500.0000 [IU] | Freq: Once | INTRAVENOUS | Status: DC
  Filled 2020-11-07: qty 5

## 2020-11-07 MED ORDER — ACETAMINOPHEN 325 MG PO TABS
650.0000 mg | ORAL_TABLET | Freq: Once | ORAL | Status: AC
  Administered 2020-11-07: 650 mg via ORAL
  Filled 2020-11-07: qty 2

## 2020-11-07 MED ORDER — SODIUM CHLORIDE 0.9 % IV SOLN
400.0000 mg | Freq: Once | INTRAVENOUS | Status: AC
  Administered 2020-11-07: 400 mg via INTRAVENOUS
  Filled 2020-11-07: qty 16

## 2020-11-07 MED ORDER — DIPHENHYDRAMINE HCL 50 MG/ML IJ SOLN
50.0000 mg | Freq: Once | INTRAMUSCULAR | Status: AC
Start: 2020-11-07 — End: 2020-11-07
  Administered 2020-11-07: 50 mg via INTRAVENOUS
  Filled 2020-11-07: qty 1

## 2020-11-07 MED ORDER — PALONOSETRON HCL INJECTION 0.25 MG/5ML
0.2500 mg | Freq: Once | INTRAVENOUS | Status: AC
  Administered 2020-11-07: 0.25 mg via INTRAVENOUS
  Filled 2020-11-07: qty 5

## 2020-11-07 MED ORDER — LEUCOVORIN CALCIUM INJECTION 350 MG
850.0000 mg | Freq: Once | INTRAVENOUS | Status: AC
  Administered 2020-11-07: 850 mg via INTRAVENOUS
  Filled 2020-11-07: qty 42.5

## 2020-11-07 MED ORDER — SODIUM CHLORIDE 0.9% FLUSH
10.0000 mL | INTRAVENOUS | Status: DC | PRN
  Administered 2020-11-07: 10 mL via INTRAVENOUS
  Filled 2020-11-07: qty 10

## 2020-11-07 MED ORDER — PEMBROLIZUMAB CHEMO INJECTION 100 MG/4ML: 2.0000 mg/kg | Freq: Once | INTRAVENOUS | Status: DC

## 2020-11-07 MED ORDER — FLUOROURACIL CHEMO INJECTION 2.5 GM/50ML
400.0000 mg/m2 | Freq: Once | INTRAVENOUS | Status: AC
  Administered 2020-11-07: 850 mg via INTRAVENOUS
  Filled 2020-11-07: qty 17

## 2020-11-07 MED ORDER — SODIUM CHLORIDE 0.9 % IV SOLN
Freq: Once | INTRAVENOUS | Status: AC
  Filled 2020-11-07: qty 250

## 2020-11-07 MED ORDER — SODIUM CHLORIDE 0.9 % IV SOLN
10.0000 mg | Freq: Once | INTRAVENOUS | Status: AC
  Administered 2020-11-07: 10 mg via INTRAVENOUS
  Filled 2020-11-07: qty 10

## 2020-11-07 MED ORDER — SODIUM CHLORIDE 0.9 % IV SOLN
5000.0000 mg | INTRAVENOUS | Status: DC
  Administered 2020-11-07: 5000 mg via INTRAVENOUS
  Filled 2020-11-07: qty 100

## 2020-11-07 MED ORDER — OXALIPLATIN CHEMO INJECTION 100 MG/20ML
65.0000 mg/m2 | Freq: Once | INTRAVENOUS | Status: AC
  Administered 2020-11-07: 140 mg via INTRAVENOUS
  Filled 2020-11-07: qty 20

## 2020-11-07 MED ORDER — DEXTROSE 5 % IV SOLN
Freq: Once | INTRAVENOUS | Status: AC
  Filled 2020-11-07: qty 250

## 2020-11-07 MED ORDER — TRASTUZUMAB-ANNS CHEMO 150 MG IV SOLR
4.0000 mg/kg | Freq: Once | INTRAVENOUS | Status: AC
  Administered 2020-11-07: 357 mg via INTRAVENOUS
  Filled 2020-11-07: qty 17

## 2020-11-07 NOTE — Progress Notes (Signed)
Numbness of toes at times on both feet

## 2020-11-07 NOTE — Progress Notes (Signed)
Nutrition Assessment:  Esophageal cancer  65 year old male with stage IV adenocarcinoma of GE junction with liver mets.  Past medical history of GERD.  Patient receiving folfox, keytruda, trastuzumab-anns.    Met with patient during infusion.  Patient reports that his appetite is good.  Swallowing better than when initially diagnosed.  Typically eats eggs, grits, sausage and toast for breakfast.  Lunch is sandwich or burger or soup. Dinner is meat and couple side items.  Patient cooks all meals as wife has dementia. Church brings them meal on Wednesday evening.  Patient denies nutrition impact symptoms at this time. Patient reports that sometimes he has trouble swallowing steak but otherwise chews foods well and does not have problems.  Patient drinks boost usually after breakfast.      Medications: reviewed  Labs: reviewed  Anthropometrics:   Height: 71 inches Weight: 197 lb UBW: 230 lb Jan 2021 Lowest weight 188 lb 2/22 BMI: 27 Gaining weight recently  14% weight loss in the last year and half  Estimated Energy Needs  Kcals: 2250-2700 Protein: 112-135 g Fluid: 2.2 L  NUTRITION DIAGNOSIS: Inadequate oral intake related to cancer and cancer related symptoms (dysphagia) as evidenced by initial weight loss down to 188 lb improving with treatment    INTERVENTION:  Discussed with patient importance of well balanced diet. Encouraged good sources of protein.  We discussed protein foods. Continue boost shake q daily Contact information provided    MONITORING, EVALUATION, GOAL: weight trends, intake   NEXT VISIT: to be determined with treatment  Leisha Trinkle B. Zenia Resides, [place], [place] Registered Dietitian [phone] (mobile)

## 2020-11-07 NOTE — Progress Notes (Signed)
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 Gallbladder is unremarkable. No evidence of biliary ductal dilatation. Pancreas:  No mass or inflammatory changes. Spleen:  Within normal limits in size and appearance. Adrenals/Urinary tract: No masses identified. A few tiny less than 5 mm renal calculi are again seen bilaterally. No evidence of ureteral calculi or hydronephrosis. Stomach/Bowel: No evidence of obstruction, inflammatory process, or abnormal fluid collections. Vascular/Lymphatic: There has been interval resolution of previously seen lymphadenopathy in the gastrohepatic ligament. No pathologically enlarged lymph nodes identified elsewhere within the abdomen or pelvis. No abdominal aortic aneurysm. Aortic atherosclerotic calcification noted. Reproductive:  No mass or other significant abnormality identified. Other:  None. Musculoskeletal:  No suspicious bone lesions identified. IMPRESSION: Near complete resolution of distal esophageal mass. Interval resolution of gastrohepatic ligament lymphadenopathy. Diffuse liver metastases. Comparison with previous PET-CT is limited due to poor visualization of individual lesions on previous noncontrast PET-CT images. No new sites of metastatic disease identified. Bilateral nephrolithiasis. No evidence of ureteral calculi or hydronephrosis. Aortic Atherosclerosis (ICD10-I70.0).  Electronically Signed   By: Marlaine Hind M.D.   On: 10/12/2020 17:19   ECHOCARDIOGRAM COMPLETE  Result Date: 10/16/2020    ECHOCARDIOGRAM REPORT   Patient Name:   Cory [name] Date of Exam: 10/16/2020 Medical Rec #:  [id number]      Height:       71.0 in Accession #:    [id number]     Weight:       193.3 lb Date of Birth:  18-Aug-1955     BSA:          2.078 m Patient Age:    23 years       BP:           153/76 mmHg Patient Gender: M              HR:           67 bpm. Exam Location:  [facility] Procedure: Cardiac Doppler, Color Doppler, 2D Echo and Strain Analysis Indications:     C15.9 Esophageal adenocarcinoma                  Z51.12 Encounter for antineoplastic chemotherapy  History:         Patient has prior history of Echocardiogram examinations, most                  recent 07/17/2020. Risk Factors:Hypertension and Diabetes.  Sonographer:     Sherrie Sport RDCS (AE) Referring Phys:  [id number] Weston Anna Tomeshia Pizzi Diagnosing Phys: Nelva Bush MD  Sonographer Comments: Global longitudinal strain was attempted. IMPRESSIONS  1. Left ventricular ejection fraction, by estimation, is 55 to 60%. The left ventricle has normal function. The left ventricle has no regional wall motion abnormalities. Left ventricular diastolic parameters are consistent with Grade I diastolic dysfunction (impaired relaxation). The average left ventricular global longitudinal strain is -11.7 %. The global longitudinal strain is abnormal.  2. Right ventricular systolic function is normal. The right ventricular size is normal.  3. The mitral valve is normal in structure. Mild to moderate mitral valve regurgitation. No evidence of mitral stenosis.  4. The aortic valve was not well visualized. Aortic valve regurgitation is not visualized. No aortic stenosis is present.  5. Aortic dilatation noted. There is borderline dilatation of the aortic root, measuring 38 mm. FINDINGS  Left Ventricle: Left ventricular ejection fraction, by estimation, is 55 to 60%.  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 Vicente Males which showed a large nearly obstructing mass in the lower third of the esophagus 35 cm from incisors. This was biopsied and was consistent with adenocarcinoma.HER-2 positive. PD-l1 <1 %, TMB HIGH  Presently patient reports dysphagia but is able to eat cereal or soft food. He has lost about 13 pounds in the last month itself. Denies any significant pain. He recently retired after many years of service and is otherwise independent of his ADLs and IADLs.  PET CT scan on 06/26/2020 showed circumferential distal esophageal mass with an SUV of 13.3 extending over 5.5 cm. Right lower paratracheal node is 0.6 cm with an SUV of 3.1. Hypermetabolic right gastric lymph nodes including 1.1 cm node with an SUV of 5.8. Numerous hypermetabolic lesions throughout the liver somewhat confluent around the periphery which were hypermetabolic between 8 and 9. Faintly hypermetabolic left adrenal gland  NGS testing showed high tumor mutational burden CPS score less than 1.CD9-NRG1 fusion.BRAF 581L, CCN E1 gain. EMLA for fusion of BX [id number], FGF 23 gain, FGF 6 gain, MET R988C, T p53 S1 1F.ERBB2 gain but no other actionable  mutations  Patient currently on first-line FOLFOX/trastuzumab/Keytruda   Interval history-patient reports doing well.  He reports mild tingling numbness in his bilateral fingers which is intermittent.   Appetite and weight have remained stable.  Denies any difficulty swallowing.  Denies any nausea vomiting.  ECOG PS- 1 Pain scale- 0   Review of systems- Review of Systems  Constitutional: Negative for chills, fever, malaise/fatigue and weight loss.  HENT: Negative for congestion, ear discharge and nosebleeds.   Eyes: Negative for blurred vision.  Respiratory: Negative for cough, hemoptysis, sputum production, shortness of breath and wheezing.   Cardiovascular: Negative for chest pain, palpitations, orthopnea and claudication.  Gastrointestinal: Negative for abdominal pain, blood in stool, constipation, diarrhea, heartburn, melena, nausea and vomiting.  Genitourinary: Negative for dysuria, flank pain, frequency, hematuria and urgency.  Musculoskeletal: Negative for back pain, joint pain and myalgias.  Skin: Negative for rash.  Neurological: Positive for sensory change (Peripheral neuropathy). Negative for dizziness, tingling, focal weakness, seizures, weakness and headaches.  Endo/Heme/Allergies: Does not bruise/bleed easily.  Psychiatric/Behavioral: Negative for depression and suicidal ideas. The patient does not have insomnia.       No Known Allergies   Past Medical History:  Diagnosis Date  . Esophageal adenocarcinoma ([place]) 06/23/2020  . Family history of brain cancer   . Family history of colon cancer   . Family history of melanoma   . Family history of stomach cancer   . GERD (gastroesophageal reflux disease)   . History of kidney stones   . Nephrolithiasis    [facility]  . Pancreatitis, acute   . Personal history of colonic polyps    Dr Nicolasa Ducking  . 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 Vicente Males which showed a large nearly obstructing mass in the lower third of the esophagus 35 cm from incisors. This was biopsied and was consistent with adenocarcinoma.HER-2 positive. PD-l1 <1 %, TMB HIGH  Presently patient reports dysphagia but is able to eat cereal or soft food. He has lost about 13 pounds in the last month itself. Denies any significant pain. He recently retired after many years of service and is otherwise independent of his ADLs and IADLs.  PET CT scan on 06/26/2020 showed circumferential distal esophageal mass with an SUV of 13.3 extending over 5.5 cm. Right lower paratracheal node is 0.6 cm with an SUV of 3.1. Hypermetabolic right gastric lymph nodes including 1.1 cm node with an SUV of 5.8. Numerous hypermetabolic lesions throughout the liver somewhat confluent around the periphery which were hypermetabolic between 8 and 9. Faintly hypermetabolic left adrenal gland  NGS testing showed high tumor mutational burden CPS score less than 1.CD9-NRG1 fusion.BRAF 581L, CCN E1 gain. EMLA for fusion of BX [id number], FGF 23 gain, FGF 6 gain, MET R988C, T p53 S1 1F.ERBB2 gain but no other actionable  mutations  Patient currently on first-line FOLFOX/trastuzumab/Keytruda   Interval history-patient reports doing well.  He reports mild tingling numbness in his bilateral fingers which is intermittent.   Appetite and weight have remained stable.  Denies any difficulty swallowing.  Denies any nausea vomiting.  ECOG PS- 1 Pain scale- 0   Review of systems- Review of Systems  Constitutional: Negative for chills, fever, malaise/fatigue and weight loss.  HENT: Negative for congestion, ear discharge and nosebleeds.   Eyes: Negative for blurred vision.  Respiratory: Negative for cough, hemoptysis, sputum production, shortness of breath and wheezing.   Cardiovascular: Negative for chest pain, palpitations, orthopnea and claudication.  Gastrointestinal: Negative for abdominal pain, blood in stool, constipation, diarrhea, heartburn, melena, nausea and vomiting.  Genitourinary: Negative for dysuria, flank pain, frequency, hematuria and urgency.  Musculoskeletal: Negative for back pain, joint pain and myalgias.  Skin: Negative for rash.  Neurological: Positive for sensory change (Peripheral neuropathy). Negative for dizziness, tingling, focal weakness, seizures, weakness and headaches.  Endo/Heme/Allergies: Does not bruise/bleed easily.  Psychiatric/Behavioral: Negative for depression and suicidal ideas. The patient does not have insomnia.       No Known Allergies   Past Medical History:  Diagnosis Date  . Esophageal adenocarcinoma ([place]) 06/23/2020  . Family history of brain cancer   . Family history of colon cancer   . Family history of melanoma   . Family history of stomach cancer   . GERD (gastroesophageal reflux disease)   . History of kidney stones   . Nephrolithiasis    [facility]  . Pancreatitis, acute   . Personal history of colonic polyps    Dr Nicolasa Ducking  . 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 Vicente Males which showed a large nearly obstructing mass in the lower third of the esophagus 35 cm from incisors. This was biopsied and was consistent with adenocarcinoma.HER-2 positive. PD-l1 <1 %, TMB HIGH  Presently patient reports dysphagia but is able to eat cereal or soft food. He has lost about 13 pounds in the last month itself. Denies any significant pain. He recently retired after many years of service and is otherwise independent of his ADLs and IADLs.  PET CT scan on 06/26/2020 showed circumferential distal esophageal mass with an SUV of 13.3 extending over 5.5 cm. Right lower paratracheal node is 0.6 cm with an SUV of 3.1. Hypermetabolic right gastric lymph nodes including 1.1 cm node with an SUV of 5.8. Numerous hypermetabolic lesions throughout the liver somewhat confluent around the periphery which were hypermetabolic between 8 and 9. Faintly hypermetabolic left adrenal gland  NGS testing showed high tumor mutational burden CPS score less than 1.CD9-NRG1 fusion.BRAF 581L, CCN E1 gain. EMLA for fusion of BX [id number], FGF 23 gain, FGF 6 gain, MET R988C, T p53 S1 1F.ERBB2 gain but no other actionable  mutations  Patient currently on first-line FOLFOX/trastuzumab/Keytruda   Interval history-patient reports doing well.  He reports mild tingling numbness in his bilateral fingers which is intermittent.   Appetite and weight have remained stable.  Denies any difficulty swallowing.  Denies any nausea vomiting.  ECOG PS- 1 Pain scale- 0   Review of systems- Review of Systems  Constitutional: Negative for chills, fever, malaise/fatigue and weight loss.  HENT: Negative for congestion, ear discharge and nosebleeds.   Eyes: Negative for blurred vision.  Respiratory: Negative for cough, hemoptysis, sputum production, shortness of breath and wheezing.   Cardiovascular: Negative for chest pain, palpitations, orthopnea and claudication.  Gastrointestinal: Negative for abdominal pain, blood in stool, constipation, diarrhea, heartburn, melena, nausea and vomiting.  Genitourinary: Negative for dysuria, flank pain, frequency, hematuria and urgency.  Musculoskeletal: Negative for back pain, joint pain and myalgias.  Skin: Negative for rash.  Neurological: Positive for sensory change (Peripheral neuropathy). Negative for dizziness, tingling, focal weakness, seizures, weakness and headaches.  Endo/Heme/Allergies: Does not bruise/bleed easily.  Psychiatric/Behavioral: Negative for depression and suicidal ideas. The patient does not have insomnia.       No Known Allergies   Past Medical History:  Diagnosis Date  . Esophageal adenocarcinoma ([place]) 06/23/2020  . Family history of brain cancer   . Family history of colon cancer   . Family history of melanoma   . Family history of stomach cancer   . GERD (gastroesophageal reflux disease)   . History of kidney stones   . Nephrolithiasis    [facility]  . Pancreatitis, acute   . Personal history of colonic polyps    Dr Nicolasa Ducking  . 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 Vicente Males which showed a large nearly obstructing mass in the lower third of the esophagus 35 cm from incisors. This was biopsied and was consistent with adenocarcinoma.HER-2 positive. PD-l1 <1 %, TMB HIGH  Presently patient reports dysphagia but is able to eat cereal or soft food. He has lost about 13 pounds in the last month itself. Denies any significant pain. He recently retired after many years of service and is otherwise independent of his ADLs and IADLs.  PET CT scan on 06/26/2020 showed circumferential distal esophageal mass with an SUV of 13.3 extending over 5.5 cm. Right lower paratracheal node is 0.6 cm with an SUV of 3.1. Hypermetabolic right gastric lymph nodes including 1.1 cm node with an SUV of 5.8. Numerous hypermetabolic lesions throughout the liver somewhat confluent around the periphery which were hypermetabolic between 8 and 9. Faintly hypermetabolic left adrenal gland  NGS testing showed high tumor mutational burden CPS score less than 1.CD9-NRG1 fusion.BRAF 581L, CCN E1 gain. EMLA for fusion of BX [id number], FGF 23 gain, FGF 6 gain, MET R988C, T p53 S1 1F.ERBB2 gain but no other actionable  mutations  Patient currently on first-line FOLFOX/trastuzumab/Keytruda   Interval history-patient reports doing well.  He reports mild tingling numbness in his bilateral fingers which is intermittent.   Appetite and weight have remained stable.  Denies any difficulty swallowing.  Denies any nausea vomiting.  ECOG PS- 1 Pain scale- 0   Review of systems- Review of Systems  Constitutional: Negative for chills, fever, malaise/fatigue and weight loss.  HENT: Negative for congestion, ear discharge and nosebleeds.   Eyes: Negative for blurred vision.  Respiratory: Negative for cough, hemoptysis, sputum production, shortness of breath and wheezing.   Cardiovascular: Negative for chest pain, palpitations, orthopnea and claudication.  Gastrointestinal: Negative for abdominal pain, blood in stool, constipation, diarrhea, heartburn, melena, nausea and vomiting.  Genitourinary: Negative for dysuria, flank pain, frequency, hematuria and urgency.  Musculoskeletal: Negative for back pain, joint pain and myalgias.  Skin: Negative for rash.  Neurological: Positive for sensory change (Peripheral neuropathy). Negative for dizziness, tingling, focal weakness, seizures, weakness and headaches.  Endo/Heme/Allergies: Does not bruise/bleed easily.  Psychiatric/Behavioral: Negative for depression and suicidal ideas. The patient does not have insomnia.       No Known Allergies   Past Medical History:  Diagnosis Date  . Esophageal adenocarcinoma ([place]) 06/23/2020  . Family history of brain cancer   . Family history of colon cancer   . Family history of melanoma   . Family history of stomach cancer   . GERD (gastroesophageal reflux disease)   . History of kidney stones   . Nephrolithiasis    [facility]  . Pancreatitis, acute   . Personal history of colonic polyps    Dr Nicolasa Ducking  . 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 Gallbladder is unremarkable. No evidence of biliary ductal dilatation. Pancreas:  No mass or inflammatory changes. Spleen:  Within normal limits in size and appearance. Adrenals/Urinary tract: No masses identified. A few tiny less than 5 mm renal calculi are again seen bilaterally. No evidence of ureteral calculi or hydronephrosis. Stomach/Bowel: No evidence of obstruction, inflammatory process, or abnormal fluid collections. Vascular/Lymphatic: There has been interval resolution of previously seen lymphadenopathy in the gastrohepatic ligament. No pathologically enlarged lymph nodes identified elsewhere within the abdomen or pelvis. No abdominal aortic aneurysm. Aortic atherosclerotic calcification noted. Reproductive:  No mass or other significant abnormality identified. Other:  None. Musculoskeletal:  No suspicious bone lesions identified. IMPRESSION: Near complete resolution of distal esophageal mass. Interval resolution of gastrohepatic ligament lymphadenopathy. Diffuse liver metastases. Comparison with previous PET-CT is limited due to poor visualization of individual lesions on previous noncontrast PET-CT images. No new sites of metastatic disease identified. Bilateral nephrolithiasis. No evidence of ureteral calculi or hydronephrosis. Aortic Atherosclerosis (ICD10-I70.0).  Electronically Signed   By: Marlaine Hind M.D.   On: 10/12/2020 17:19   ECHOCARDIOGRAM COMPLETE  Result Date: 10/16/2020    ECHOCARDIOGRAM REPORT   Patient Name:   Cory [name] Date of Exam: 10/16/2020 Medical Rec #:  [id number]      Height:       71.0 in Accession #:    [id number]     Weight:       193.3 lb Date of Birth:  18-Aug-1955     BSA:          2.078 m Patient Age:    23 years       BP:           153/76 mmHg Patient Gender: M              HR:           67 bpm. Exam Location:  [facility] Procedure: Cardiac Doppler, Color Doppler, 2D Echo and Strain Analysis Indications:     C15.9 Esophageal adenocarcinoma                  Z51.12 Encounter for antineoplastic chemotherapy  History:         Patient has prior history of Echocardiogram examinations, most                  recent 07/17/2020. Risk Factors:Hypertension and Diabetes.  Sonographer:     Sherrie Sport RDCS (AE) Referring Phys:  [id number] Weston Anna Tomeshia Pizzi Diagnosing Phys: Nelva Bush MD  Sonographer Comments: Global longitudinal strain was attempted. IMPRESSIONS  1. Left ventricular ejection fraction, by estimation, is 55 to 60%. The left ventricle has normal function. The left ventricle has no regional wall motion abnormalities. Left ventricular diastolic parameters are consistent with Grade I diastolic dysfunction (impaired relaxation). The average left ventricular global longitudinal strain is -11.7 %. The global longitudinal strain is abnormal.  2. Right ventricular systolic function is normal. The right ventricular size is normal.  3. The mitral valve is normal in structure. Mild to moderate mitral valve regurgitation. No evidence of mitral stenosis.  4. The aortic valve was not well visualized. Aortic valve regurgitation is not visualized. No aortic stenosis is present.  5. Aortic dilatation noted. There is borderline dilatation of the aortic root, measuring 38 mm. FINDINGS  Left Ventricle: Left ventricular ejection fraction, by estimation, is 55 to 60%.  The left ventricle has

## 2020-11-07 NOTE — Patient Instructions (Signed)
[facility]  Discharge Instructions: Thank you for choosing [facility] to provide your oncology and hematology care.  If you have a lab appointment with the [facility], please go directly to the [facility] and check in at the registration area.  Wear comfortable clothing and clothing appropriate for easy access to any Portacath or PICC line.   We strive to give you quality time with your provider. You may need to reschedule your appointment if you arrive late (15 or more minutes).  Arriving late affects you and other patients whose appointments are after yours.  Also, if you miss three or more appointments without notifying the office, you may be dismissed from the clinic at the provider's discretion.      For prescription refill requests, have your pharmacy contact our office and allow 72 hours for refills to be completed.    Today you received the following chemotherapy and/or immunotherapy agents Keytruda, Kanjinti, Oxaliplatin, Leucovorin, Adrucil    To help prevent nausea and vomiting after your treatment, we encourage you to take your nausea medication as directed.  BELOW ARE SYMPTOMS THAT SHOULD BE REPORTED IMMEDIATELY: . *FEVER GREATER THAN 100.4 F (38 C) OR HIGHER . *CHILLS OR SWEATING . *NAUSEA AND VOMITING THAT IS NOT CONTROLLED WITH YOUR NAUSEA MEDICATION . *UNUSUAL SHORTNESS OF BREATH . *UNUSUAL BRUISING OR BLEEDING . *URINARY PROBLEMS (pain or burning when urinating, or frequent urination) . *BOWEL PROBLEMS (unusual diarrhea, constipation, pain near the anus) . TENDERNESS IN MOUTH AND THROAT WITH OR WITHOUT PRESENCE OF ULCERS (sore throat, sores in mouth, or a toothache) . UNUSUAL RASH, SWELLING OR PAIN  . UNUSUAL VAGINAL DISCHARGE OR ITCHING   Items with * indicate a potential emergency and should be followed up as soon as possible or go to the Emergency Department if any problems should occur.  Please show the  CHEMOTHERAPY ALERT CARD or IMMUNOTHERAPY ALERT CARD at check-in to the Emergency Department and triage nurse.  Should you have questions after your visit or need to cancel or reschedule your appointment, please contact [facility]  [phone] and follow the prompts.  Office hours are 8:00 a.m. to 4:30 p.m. Monday - Friday. Please note that voicemails left after 4:00 p.m. may not be returned until the following business day.  We are closed weekends and major holidays. You have access to a nurse at all times for urgent questions. Please call the main number to the clinic [phone] and follow the prompts.  For any non-urgent questions, you may also contact your provider using MyChart. We now offer e-Visits for anyone 1 and older to request care online for non-urgent symptoms. For details visit [email or web address].   Also download the MyChart app! Go to the app store, search "MyChart", open the app, select [facility], and log in with your MyChart username and password.  Due to Covid, a mask is required upon entering the hospital/clinic. If you do not have a mask, one will be given to you upon arrival. For doctor visits, patients may have 1 support person aged 72 or older with them. For treatment visits, patients cannot have anyone with them due to current Covid guidelines and our immunocompromised population.

## 2020-11-08 LAB — CEA: CEA: 5.6 ng/mL — ABNORMAL HIGH (ref 0.0–4.7)

## 2020-11-09 ENCOUNTER — Inpatient Hospital Stay: Payer: Medicare Other

## 2020-11-09 ENCOUNTER — Other Ambulatory Visit: Payer: Self-pay

## 2020-11-09 VITALS — BP 151/65 | HR 65 | Temp 97.7°F | Resp 18

## 2020-11-09 DIAGNOSIS — Z5112 Encounter for antineoplastic immunotherapy: Secondary | ICD-10-CM | POA: Diagnosis not present

## 2020-11-09 DIAGNOSIS — C159 Malignant neoplasm of esophagus, unspecified: Secondary | ICD-10-CM

## 2020-11-09 DIAGNOSIS — Z95828 Presence of other vascular implants and grafts: Secondary | ICD-10-CM

## 2020-11-09 MED ORDER — HEPARIN SOD (PORK) LOCK FLUSH 100 UNIT/ML IV SOLN
INTRAVENOUS | Status: AC
  Filled 2020-11-09: qty 5

## 2020-11-09 MED ORDER — HEPARIN SOD (PORK) LOCK FLUSH 100 UNIT/ML IV SOLN
500.0000 [IU] | Freq: Once | INTRAVENOUS | Status: AC
  Administered 2020-11-09: 500 [IU] via INTRAVENOUS
  Filled 2020-11-09: qty 5

## 2020-11-09 MED ORDER — PEGFILGRASTIM-CBQV 6 MG/0.6ML ~~LOC~~ SOSY
6.0000 mg | PREFILLED_SYRINGE | Freq: Once | SUBCUTANEOUS | Status: AC
  Administered 2020-11-09: 6 mg via SUBCUTANEOUS
  Filled 2020-11-09: qty 0.6

## 2020-11-09 NOTE — Patient Instructions (Signed)
[facility]  Discharge Instructions: Thank you for choosing [facility] to provide your oncology and hematology care.  If you have a lab appointment with the [facility], please go directly to the [facility] and check in at the registration area.  Wear comfortable clothing and clothing appropriate for easy access to any Portacath or PICC line.   We strive to give you quality time with your provider. You may need to reschedule your appointment if you arrive late (15 or more minutes).  Arriving late affects you and other patients whose appointments are after yours.  Also, if you miss three or more appointments without notifying the office, you may be dismissed from the clinic at the provider's discretion.      For prescription refill requests, have your pharmacy contact our office and allow 72 hours for refills to be completed.    Today you received the following chemotherapy and/or immunotherapy agents udenyca   To help prevent nausea and vomiting after your treatment, we encourage you to take your nausea medication as directed.  BELOW ARE SYMPTOMS THAT SHOULD BE REPORTED IMMEDIATELY: . *FEVER GREATER THAN 100.4 F (38 C) OR HIGHER . *CHILLS OR SWEATING . *NAUSEA AND VOMITING THAT IS NOT CONTROLLED WITH YOUR NAUSEA MEDICATION . *UNUSUAL SHORTNESS OF BREATH . *UNUSUAL BRUISING OR BLEEDING . *URINARY PROBLEMS (pain or burning when urinating, or frequent urination) . *BOWEL PROBLEMS (unusual diarrhea, constipation, pain near the anus) . TENDERNESS IN MOUTH AND THROAT WITH OR WITHOUT PRESENCE OF ULCERS (sore throat, sores in mouth, or a toothache) . UNUSUAL RASH, SWELLING OR PAIN  . UNUSUAL VAGINAL DISCHARGE OR ITCHING   Items with * indicate a potential emergency and should be followed up as soon as possible or go to the Emergency Department if any problems should occur.  Please show the CHEMOTHERAPY ALERT CARD or IMMUNOTHERAPY ALERT  CARD at check-in to the Emergency Department and triage nurse.  Should you have questions after your visit or need to cancel or reschedule your appointment, please contact [facility]  [phone] and follow the prompts.  Office hours are 8:00 a.m. to 4:30 p.m. Monday - Friday. Please note that voicemails left after 4:00 p.m. may not be returned until the following business day.  We are closed weekends and major holidays. You have access to a nurse at all times for urgent questions. Please call the main number to the clinic [phone] and follow the prompts.  For any non-urgent questions, you may also contact your provider using MyChart. We now offer e-Visits for anyone 31 and older to request care online for non-urgent symptoms. For details visit [email or web address].   Also download the MyChart app! Go to the app store, search "MyChart", open the app, select [facility], and log in with your MyChart username and password.  Due to Covid, a mask is required upon entering the hospital/clinic. If you do not have a mask, one will be given to you upon arrival. For doctor visits, patients may have 1 support person aged 65 or older with them. For treatment visits, patients cannot have anyone with them due to current Covid guidelines and our immunocompromised population.   Pegfilgrastim injection What is this medicine? PEGFILGRASTIM (PEG fil gra stim) is a long-acting granulocyte colony-stimulating factor that stimulates the growth of neutrophils, a type of white blood cell important in the body's fight against infection. It is used to reduce the incidence of fever and infection in  patients with certain types of cancer who are receiving chemotherapy that affects the bone marrow, and to increase survival after being exposed to high doses of radiation. This medicine may be used for other purposes; ask your health care provider or pharmacist if you have questions. COMMON  BRAND NAME(S): Rexene Edison, Ziextenzo What should I tell my health care provider before I take this medicine? They need to know if you have any of these conditions:  kidney disease  latex allergy  ongoing radiation therapy  sickle cell disease  skin reactions to acrylic adhesives (On-Body Injector only)  an unusual or allergic reaction to pegfilgrastim, filgrastim, other medicines, foods, dyes, or preservatives  pregnant or trying to get pregnant  breast-feeding How should I use this medicine? This medicine is for injection under the skin. If you get this medicine at home, you will be taught how to prepare and give the pre-filled syringe or how to use the On-body Injector. Refer to the patient Instructions for Use for detailed instructions. Use exactly as directed. Tell your healthcare provider immediately if you suspect that the On-body Injector may not have performed as intended or if you suspect the use of the On-body Injector resulted in a missed or partial dose. It is important that you put your used needles and syringes in a special sharps container. Do not put them in a trash can. If you do not have a sharps container, call your pharmacist or healthcare provider to get one. Talk to your pediatrician regarding the use of this medicine in children. While this drug may be prescribed for selected conditions, precautions do apply. Overdosage: If you think you have taken too much of this medicine contact a poison control center or emergency room at once. NOTE: This medicine is only for you. Do not share this medicine with others. What if I miss a dose? It is important not to miss your dose. Call your doctor or health care professional if you miss your dose. If you miss a dose due to an On-body Injector failure or leakage, a new dose should be administered as soon as possible using a single prefilled syringe for manual use. What may interact with this  medicine? Interactions have not been studied. This list may not describe all possible interactions. Give your health care provider a list of all the medicines, herbs, non-prescription drugs, or dietary supplements you use. Also tell them if you smoke, drink alcohol, or use illegal drugs. Some items may interact with your medicine. What should I watch for while using this medicine? Your condition will be monitored carefully while you are receiving this medicine. You may need blood work done while you are taking this medicine. Talk to your health care provider about your risk of cancer. You may be more at risk for certain types of cancer if you take this medicine. If you are going to need a MRI, CT scan, or other procedure, tell your doctor that you are using this medicine (On-Body Injector only). What side effects may I notice from receiving this medicine? Side effects that you should report to your doctor or health care professional as soon as possible:  allergic reactions (skin rash, itching or hives, swelling of the face, lips, or tongue)  back pain  dizziness  fever  pain, redness, or irritation at site where injected  pinpoint red spots on the skin  red or dark-brown urine  shortness of breath or breathing problems  stomach or side pain, or pain  at the shoulder  swelling  tiredness  trouble passing urine or change in the amount of urine  unusual bruising or bleeding Side effects that usually do not require medical attention (report to your doctor or health care professional if they continue or are bothersome):  bone pain  muscle pain This list may not describe all possible side effects. Call your doctor for medical advice about side effects. You may report side effects to FDA at 1-800-FDA-1088. Where should I keep my medicine? Keep out of the reach of children. If you are using this medicine at home, you will be instructed on how to store it. Throw away any unused  medicine after the expiration date on the label. NOTE: This sheet is a summary. It may not cover all possible information. If you have questions about this medicine, talk to your doctor, pharmacist, or health care provider.  2021 [employer]vier/Gold Standard (2019-07-16 13:20:51)

## 2020-11-21 ENCOUNTER — Inpatient Hospital Stay (HOSPITAL_BASED_OUTPATIENT_CLINIC_OR_DEPARTMENT_OTHER): Payer: Medicare Other | Admitting: Oncology

## 2020-11-21 ENCOUNTER — Encounter: Payer: Self-pay | Admitting: Oncology

## 2020-11-21 ENCOUNTER — Other Ambulatory Visit: Payer: Self-pay

## 2020-11-21 ENCOUNTER — Other Ambulatory Visit: Payer: Self-pay | Admitting: *Deleted

## 2020-11-21 ENCOUNTER — Inpatient Hospital Stay: Payer: Medicare Other

## 2020-11-21 VITALS — BP 130/73 | HR 72 | Temp 98.0°F | Resp 16 | Ht 71.0 in | Wt 195.0 lb

## 2020-11-21 DIAGNOSIS — Z5111 Encounter for antineoplastic chemotherapy: Secondary | ICD-10-CM

## 2020-11-21 DIAGNOSIS — D6959 Other secondary thrombocytopenia: Secondary | ICD-10-CM | POA: Diagnosis not present

## 2020-11-21 DIAGNOSIS — Z5112 Encounter for antineoplastic immunotherapy: Secondary | ICD-10-CM

## 2020-11-21 DIAGNOSIS — C159 Malignant neoplasm of esophagus, unspecified: Secondary | ICD-10-CM

## 2020-11-21 DIAGNOSIS — T451X5A Adverse effect of antineoplastic and immunosuppressive drugs, initial encounter: Secondary | ICD-10-CM

## 2020-11-21 LAB — CBC WITH DIFFERENTIAL/PLATELET
Abs Immature Granulocytes: 0.07 10*3/uL (ref 0.00–0.07)
Basophils Absolute: 0.1 10*3/uL (ref 0.0–0.1)
Basophils Relative: 1 %
Eosinophils Absolute: 0.3 10*3/uL (ref 0.0–0.5)
Eosinophils Relative: 5 %
HCT: 39.3 % (ref 39.0–52.0)
Hemoglobin: 12.9 g/dL — ABNORMAL LOW (ref 13.0–17.0)
Immature Granulocytes: 1 %
Lymphocytes Relative: 15 %
Lymphs Abs: 1 10*3/uL (ref 0.7–4.0)
MCH: 30.6 pg (ref 26.0–34.0)
MCHC: 32.8 g/dL (ref 30.0–36.0)
MCV: 93.1 fL (ref 80.0–100.0)
Monocytes Absolute: 0.5 10*3/uL (ref 0.1–1.0)
Monocytes Relative: 7 %
Neutro Abs: 4.7 10*3/uL (ref 1.7–7.7)
Neutrophils Relative %: 71 %
Platelets: 120 10*3/uL — ABNORMAL LOW (ref 150–400)
RBC: 4.22 MIL/uL (ref 4.22–5.81)
RDW: 15 % (ref 11.5–15.5)
WBC: 6.6 10*3/uL (ref 4.0–10.5)
nRBC: 0 % (ref 0.0–0.2)

## 2020-11-21 LAB — COMPREHENSIVE METABOLIC PANEL
ALT: 27 U/L (ref 0–44)
AST: 32 U/L (ref 15–41)
Albumin: 3.7 g/dL (ref 3.5–5.0)
Alkaline Phosphatase: 142 U/L — ABNORMAL HIGH (ref 38–126)
Anion gap: 9 (ref 5–15)
BUN: 12 mg/dL (ref 8–23)
CO2: 24 mmol/L (ref 22–32)
Calcium: 9 mg/dL (ref 8.9–10.3)
Chloride: 105 mmol/L (ref 98–111)
Creatinine, Ser: 0.79 mg/dL (ref 0.61–1.24)
GFR, Estimated: >60 mL/min (ref >60)
Glucose, Bld: 122 mg/dL — ABNORMAL HIGH (ref 70–99)
Potassium: 3.8 mmol/L (ref 3.5–5.1)
Sodium: 138 mmol/L (ref 135–145)
Total Bilirubin: 1.1 mg/dL (ref 0.3–1.2)
Total Protein: 6.4 g/dL — ABNORMAL LOW (ref 6.5–8.1)

## 2020-11-21 MED ORDER — PALONOSETRON HCL INJECTION 0.25 MG/5ML
0.2500 mg | Freq: Once | INTRAVENOUS | Status: AC
  Administered 2020-11-21: 0.25 mg via INTRAVENOUS
  Filled 2020-11-21: qty 5

## 2020-11-21 MED ORDER — FLUOROURACIL CHEMO INJECTION 2.5 GM/50ML
400.0000 mg/m2 | Freq: Once | INTRAVENOUS | Status: AC
  Administered 2020-11-21: 850 mg via INTRAVENOUS
  Filled 2020-11-21: qty 17

## 2020-11-21 MED ORDER — TRASTUZUMAB-ANNS CHEMO 150 MG IV SOLR
4.0000 mg/kg | Freq: Once | INTRAVENOUS | Status: AC
  Administered 2020-11-21: 357 mg via INTRAVENOUS
  Filled 2020-11-21: qty 17

## 2020-11-21 MED ORDER — SODIUM CHLORIDE 0.9 % IV SOLN
10.0000 mg | Freq: Once | INTRAVENOUS | Status: AC
  Administered 2020-11-21: 10 mg via INTRAVENOUS
  Filled 2020-11-21: qty 10

## 2020-11-21 MED ORDER — DIPHENHYDRAMINE HCL 50 MG/ML IJ SOLN
50.0000 mg | Freq: Once | INTRAMUSCULAR | Status: AC
  Administered 2020-11-21: 50 mg via INTRAVENOUS
  Filled 2020-11-21: qty 1

## 2020-11-21 MED ORDER — SODIUM CHLORIDE 0.9% FLUSH
10.0000 mL | Freq: Once | INTRAVENOUS | Status: AC
  Administered 2020-11-21: 10 mL via INTRAVENOUS
  Filled 2020-11-21: qty 10

## 2020-11-21 MED ORDER — LIDOCAINE-PRILOCAINE 2.5-2.5 % EX CREA: 1.0000 "application " | TOPICAL_CREAM | CUTANEOUS | 3 refills | Status: DC | PRN

## 2020-11-21 MED ORDER — OXALIPLATIN CHEMO INJECTION 100 MG/20ML
65.0000 mg/m2 | Freq: Once | INTRAVENOUS | Status: AC
  Administered 2020-11-21: 140 mg via INTRAVENOUS
  Filled 2020-11-21: qty 20

## 2020-11-21 MED ORDER — SODIUM CHLORIDE 0.9 % IV SOLN
Freq: Once | INTRAVENOUS | Status: AC
  Filled 2020-11-21: qty 250

## 2020-11-21 MED ORDER — HEPARIN SOD (PORK) LOCK FLUSH 100 UNIT/ML IV SOLN
500.0000 [IU] | Freq: Once | INTRAVENOUS | Status: DC
  Filled 2020-11-21: qty 5

## 2020-11-21 MED ORDER — LEUCOVORIN CALCIUM INJECTION 350 MG
850.0000 mg | Freq: Once | INTRAVENOUS | Status: AC
  Administered 2020-11-21: 850 mg via INTRAVENOUS
  Filled 2020-11-21: qty 42.5

## 2020-11-21 MED ORDER — SODIUM CHLORIDE 0.9 % IV SOLN
5000.0000 mg | INTRAVENOUS | Status: DC
  Administered 2020-11-21: 5000 mg via INTRAVENOUS
  Filled 2020-11-21: qty 100

## 2020-11-21 MED ORDER — ACETAMINOPHEN 325 MG PO TABS
650.0000 mg | ORAL_TABLET | Freq: Once | ORAL | Status: AC
Start: 2020-11-21 — End: 2020-11-21
  Administered 2020-11-21: 650 mg via ORAL
  Filled 2020-11-21: qty 2

## 2020-11-21 MED ORDER — DEXTROSE 5 % IV SOLN
Freq: Once | INTRAVENOUS | Status: AC
  Filled 2020-11-21: qty 250

## 2020-11-21 NOTE — Progress Notes (Signed)
Pt is doing good. He is making a Psychologist, sport and exercise. Eats and drinks good. No concerns.

## 2020-11-21 NOTE — Patient Instructions (Signed)
[facility]  Discharge Instructions: Thank you for choosing [facility] to provide your oncology and hematology care.  If you have a lab appointment with the [facility], please go directly to the [facility] and check in at the registration area.  Wear comfortable clothing and clothing appropriate for easy access to any Portacath or PICC line.   We strive to give you quality time with your provider. You may need to reschedule your appointment if you arrive late (15 or more minutes).  Arriving late affects you and other patients whose appointments are after yours.  Also, if you miss three or more appointments without notifying the office, you may be dismissed from the clinic at the provider's discretion.      For prescription refill requests, have your pharmacy contact our office and allow 72 hours for refills to be completed.    Today you received the following chemotherapy and/or immunotherapy agents - oxaliplatin, 5-FU, trastuzumab   To help prevent nausea and vomiting after your treatment, we encourage you to take your nausea medication as directed.  BELOW ARE SYMPTOMS THAT SHOULD BE REPORTED IMMEDIATELY: . *FEVER GREATER THAN 100.4 F (38 C) OR HIGHER . *CHILLS OR SWEATING . *NAUSEA AND VOMITING THAT IS NOT CONTROLLED WITH YOUR NAUSEA MEDICATION . *UNUSUAL SHORTNESS OF BREATH . *UNUSUAL BRUISING OR BLEEDING . *URINARY PROBLEMS (pain or burning when urinating, or frequent urination) . *BOWEL PROBLEMS (unusual diarrhea, constipation, pain near the anus) . TENDERNESS IN MOUTH AND THROAT WITH OR WITHOUT PRESENCE OF ULCERS (sore throat, sores in mouth, or a toothache) . UNUSUAL RASH, SWELLING OR PAIN  . UNUSUAL VAGINAL DISCHARGE OR ITCHING   Items with * indicate a potential emergency and should be followed up as soon as possible or go to the Emergency Department if any problems should occur.  Please show the CHEMOTHERAPY ALERT CARD  or IMMUNOTHERAPY ALERT CARD at check-in to the Emergency Department and triage nurse.  Should you have questions after your visit or need to cancel or reschedule your appointment, please contact [facility]  [phone] and follow the prompts.  Office hours are 8:00 a.m. to 4:30 p.m. Monday - Friday. Please note that voicemails left after 4:00 p.m. may not be returned until the following business day.  We are closed weekends and major holidays. You have access to a nurse at all times for urgent questions. Please call the main number to the clinic [phone] and follow the prompts.  For any non-urgent questions, you may also contact your provider using MyChart. We now offer e-Visits for anyone 82 and older to request care online for non-urgent symptoms. For details visit [email or web address].   Also download the MyChart app! Go to the app store, search "MyChart", open the app, select [facility], and log in with your MyChart username and password.  Due to Covid, a mask is required upon entering the hospital/clinic. If you do not have a mask, one will be given to you upon arrival. For doctor visits, patients may have 1 support person aged 19 or older with them. For treatment visits, patients cannot have anyone with them due to current Covid guidelines and our immunocompromised population.   Oxaliplatin Injection What is this medicine? OXALIPLATIN (ox AL i PLA tin) is a chemotherapy drug. It targets fast dividing cells, like cancer cells, and causes these cells to die. This medicine is used to treat cancers of the colon and rectum, and many other  cancers. This medicine may be used for other purposes; ask your health care provider or pharmacist if you have questions. COMMON BRAND NAME(S): Eloxatin What should I tell my health care provider before I take this medicine? They need to know if you have any of these conditions:  heart disease  history of irregular  heartbeat  liver disease  low blood counts, like white cells, platelets, or red blood cells  lung or breathing disease, like asthma  take medicines that treat or prevent blood clots  tingling of the fingers or toes, or other nerve disorder  an unusual or allergic reaction to oxaliplatin, other chemotherapy, other medicines, foods, dyes, or preservatives  pregnant or trying to get pregnant  breast-feeding How should I use this medicine? This drug is given as an infusion into a vein. It is administered in a hospital or clinic by a specially trained health care professional. Talk to your pediatrician regarding the use of this medicine in children. Special care may be needed. Overdosage: If you think you have taken too much of this medicine contact a poison control center or emergency room at once. NOTE: This medicine is only for you. Do not share this medicine with others. What if I miss a dose? It is important not to miss a dose. Call your doctor or health care professional if you are unable to keep an appointment. What may interact with this medicine? Do not take this medicine with any of the following medications:  cisapride  dronedarone  pimozide  thioridazine This medicine may also interact with the following medications:  aspirin and aspirin-like medicines  certain medicines that treat or prevent blood clots like warfarin, apixaban, dabigatran, and rivaroxaban  cisplatin  cyclosporine  diuretics  medicines for infection like acyclovir, adefovir, amphotericin B, bacitracin, cidofovir, foscarnet, ganciclovir, gentamicin, pentamidine, vancomycin  NSAIDs, medicines for pain and inflammation, like ibuprofen or naproxen  other medicines that prolong the QT interval (an abnormal heart rhythm)  pamidronate  zoledronic acid This list may not describe all possible interactions. Give your health care provider a list of all the medicines, herbs, non-prescription drugs,  or dietary supplements you use. Also tell them if you smoke, drink alcohol, or use illegal drugs. Some items may interact with your medicine. What should I watch for while using this medicine? Your condition will be monitored carefully while you are receiving this medicine. You may need blood work done while you are taking this medicine. This medicine may make you feel generally unwell. This is not uncommon as chemotherapy can affect healthy cells as well as cancer cells. Report any side effects. Continue your course of treatment even though you feel ill unless your healthcare professional tells you to stop. This medicine can make you more sensitive to cold. Do not drink cold drinks or use ice. Cover exposed skin before coming in contact with cold temperatures or cold objects. When out in cold weather wear warm clothing and cover your mouth and nose to warm the air that goes into your lungs. Tell your doctor if you get sensitive to the cold. Do not become pregnant while taking this medicine or for 9 months after stopping it. Women should inform their health care professional if they wish to become pregnant or think they might be pregnant. Men should not father a child while taking this medicine and for 6 months after stopping it. There is potential for serious side effects to an unborn child. Talk to your health care professional for more information.  Do not breast-feed a child while taking this medicine or for 3 months after stopping it. This medicine has caused ovarian failure in some women. This medicine may make it more difficult to get pregnant. Talk to your health care professional if you are concerned about your fertility. This medicine has caused decreased sperm counts in some men. This may make it more difficult to father a child. Talk to your health care professional if you are concerned about your fertility. This medicine may increase your risk of getting an infection. Call your health care  professional for advice if you get a fever, chills, or sore throat, or other symptoms of a cold or flu. Do not treat yourself. Try to avoid being around people who are sick. Avoid taking medicines that contain aspirin, acetaminophen, ibuprofen, naproxen, or ketoprofen unless instructed by your health care professional. These medicines may hide a fever. Be careful brushing or flossing your teeth or using a toothpick because you may get an infection or bleed more easily. If you have any dental work done, tell your dentist you are receiving this medicine. What side effects may I notice from receiving this medicine? Side effects that you should report to your doctor or health care professional as soon as possible:  allergic reactions like skin rash, itching or hives, swelling of the face, lips, or tongue  breathing problems  cough  low blood counts - this medicine may decrease the number of white blood cells, red blood cells, and platelets. You may be at increased risk for infections and bleeding  nausea, vomiting  pain, redness, or irritation at site where injected  pain, tingling, numbness in the hands or feet  signs and symptoms of bleeding such as bloody or black, tarry stools; red or dark brown urine; spitting up blood or brown material that looks like coffee grounds; red spots on the skin; unusual bruising or bleeding from the eyes, gums, or nose  signs and symptoms of a dangerous change in heartbeat or heart rhythm like chest pain; dizziness; fast, irregular heartbeat; palpitations; feeling faint or lightheaded; falls  signs and symptoms of infection like fever; chills; cough; sore throat; pain or trouble passing urine  signs and symptoms of liver injury like dark yellow or brown urine; general ill feeling or flu-like symptoms; light-colored stools; loss of appetite; nausea; right upper belly pain; unusually weak or tired; yellowing of the eyes or skin  signs and symptoms of low red  blood cells or anemia such as unusually weak or tired; feeling faint or lightheaded; falls  signs and symptoms of muscle injury like dark urine; trouble passing urine or change in the amount of urine; unusually weak or tired; muscle pain; back pain Side effects that usually do not require medical attention (report to your doctor or health care professional if they continue or are bothersome):  changes in taste  diarrhea  gas  hair loss  loss of appetite  mouth sores This list may not describe all possible side effects. Call your doctor for medical advice about side effects. You may report side effects to FDA at 1-800-FDA-1088. Where should I keep my medicine? This drug is given in a hospital or clinic and will not be stored at home. NOTE: This sheet is a summary. It may not cover all possible information. If you have questions about this medicine, talk to your doctor, pharmacist, or health care provider.  2021 Elsevier/Gold Standard (2018-11-11 12:20:35)  Fluorouracil, 5-FU injection What is this medicine? FLUOROURACIL, 5-FU (  flure oh YOOR a sil) is a chemotherapy drug. It slows the growth of cancer cells. This medicine is used to treat many types of cancer like breast cancer, colon or rectal cancer, pancreatic cancer, and stomach cancer. This medicine may be used for other purposes; ask your health care provider or pharmacist if you have questions. COMMON BRAND NAME(S): Adrucil What should I tell my health care provider before I take this medicine? They need to know if you have any of these conditions:  blood disorders  dihydropyrimidine dehydrogenase (DPD) deficiency  infection (especially a virus infection such as chickenpox, cold sores, or herpes)  kidney disease  liver disease  malnourished, poor nutrition  recent or ongoing radiation therapy  an unusual or allergic reaction to fluorouracil, other chemotherapy, other medicines, foods, dyes, or  preservatives  pregnant or trying to get pregnant  breast-feeding How should I use this medicine? This drug is given as an infusion or injection into a vein. It is administered in a hospital or clinic by a specially trained health care professional. Talk to your pediatrician regarding the use of this medicine in children. Special care may be needed. Overdosage: If you think you have taken too much of this medicine contact a poison control center or emergency room at once. NOTE: This medicine is only for you. Do not share this medicine with others. What if I miss a dose? It is important not to miss your dose. Call your doctor or health care professional if you are unable to keep an appointment. What may interact with this medicine? Do not take this medicine with any of the following medications:  live virus vaccines This medicine may also interact with the following medications:  medicines that treat or prevent blood clots like warfarin, enoxaparin, and dalteparin This list may not describe all possible interactions. Give your health care provider a list of all the medicines, herbs, non-prescription drugs, or dietary supplements you use. Also tell them if you smoke, drink alcohol, or use illegal drugs. Some items may interact with your medicine. What should I watch for while using this medicine? Visit your doctor for checks on your progress. This drug may make you feel generally unwell. This is not uncommon, as chemotherapy can affect healthy cells as well as cancer cells. Report any side effects. Continue your course of treatment even though you feel ill unless your doctor tells you to stop. In some cases, you may be given additional medicines to help with side effects. Follow all directions for their use. Call your doctor or health care professional for advice if you get a fever, chills or sore throat, or other symptoms of a cold or flu. Do not treat yourself. This drug decreases your body's  ability to fight infections. Try to avoid being around people who are sick. This medicine may increase your risk to bruise or bleed. Call your doctor or health care professional if you notice any unusual bleeding. Be careful brushing and flossing your teeth or using a toothpick because you may get an infection or bleed more easily. If you have any dental work done, tell your dentist you are receiving this medicine. Avoid taking products that contain aspirin, acetaminophen, ibuprofen, naproxen, or ketoprofen unless instructed by your doctor. These medicines may hide a fever. Do not become pregnant while taking this medicine. Women should inform their doctor if they wish to become pregnant or think they might be pregnant. There is a potential for serious side effects to an unborn child.  Talk to your health care professional or pharmacist for more information. Do not breast-feed an infant while taking this medicine. Men should inform their doctor if they wish to father a child. This medicine may lower sperm counts. Do not treat diarrhea with over the counter products. Contact your doctor if you have diarrhea that lasts more than 2 days or if it is severe and watery. This medicine can make you more sensitive to the sun. Keep out of the sun. If you cannot avoid being in the sun, wear protective clothing and use sunscreen. Do not use sun lamps or tanning beds/booths. What side effects may I notice from receiving this medicine? Side effects that you should report to your doctor or health care professional as soon as possible:  allergic reactions like skin rash, itching or hives, swelling of the face, lips, or tongue  low blood counts - this medicine may decrease the number of white blood cells, red blood cells and platelets. You may be at increased risk for infections and bleeding.  signs of infection - fever or chills, cough, sore throat, pain or difficulty passing urine  signs of decreased platelets or  bleeding - bruising, pinpoint red spots on the skin, black, tarry stools, blood in the urine  signs of decreased red blood cells - unusually weak or tired, fainting spells, lightheadedness  breathing problems  changes in vision  chest pain  mouth sores  nausea and vomiting  pain, swelling, redness at site where injected  pain, tingling, numbness in the hands or feet  redness, swelling, or sores on hands or feet  stomach pain  unusual bleeding Side effects that usually do not require medical attention (report to your doctor or health care professional if they continue or are bothersome):  changes in finger or toe nails  diarrhea  dry or itchy skin  hair loss  headache  loss of appetite  sensitivity of eyes to the light  stomach upset  unusually teary eyes This list may not describe all possible side effects. Call your doctor for medical advice about side effects. You may report side effects to FDA at 1-800-FDA-1088. Where should I keep my medicine? This drug is given in a hospital or clinic and will not be stored at home. NOTE: This sheet is a summary. It may not cover all possible information. If you have questions about this medicine, talk to your doctor, pharmacist, or health care provider.  2021 Elsevier/Gold Standard (2019-05-25 15:00:03)  Trastuzumab injection for infusion What is this medicine? TRASTUZUMAB (tras TOO zoo mab) is a monoclonal antibody. It is used to treat breast cancer and stomach cancer. This medicine may be used for other purposes; ask your health care provider or pharmacist if you have questions. COMMON BRAND NAME(S): Herceptin, Galvin Proffer, Trazimera What should I tell my health care provider before I take this medicine? They need to know if you have any of these conditions:  heart disease  heart failure  lung or breathing disease, like asthma  an unusual or allergic reaction to trastuzumab, benzyl  alcohol, or other medications, foods, dyes, or preservatives  pregnant or trying to get pregnant  breast-feeding How should I use this medicine? This drug is given as an infusion into a vein. It is administered in a hospital or clinic by a specially trained health care professional. Talk to your pediatrician regarding the use of this medicine in children. This medicine is not approved for use in children. Overdosage: If you think  you have taken too much of this medicine contact a poison control center or emergency room at once. NOTE: This medicine is only for you. Do not share this medicine with others. What if I miss a dose? It is important not to miss a dose. Call your doctor or health care professional if you are unable to keep an appointment. What may interact with this medicine? This medicine may interact with the following medications:  certain types of chemotherapy, such as daunorubicin, doxorubicin, epirubicin, and idarubicin This list may not describe all possible interactions. Give your health care provider a list of all the medicines, herbs, non-prescription drugs, or dietary supplements you use. Also tell them if you smoke, drink alcohol, or use illegal drugs. Some items may interact with your medicine. What should I watch for while using this medicine? Visit your doctor for checks on your progress. Report any side effects. Continue your course of treatment even though you feel ill unless your doctor tells you to stop. Call your doctor or health care professional for advice if you get a fever, chills or sore throat, or other symptoms of a cold or flu. Do not treat yourself. Try to avoid being around people who are sick. You may experience fever, chills and shaking during your first infusion. These effects are usually mild and can be treated with other medicines. Report any side effects during the infusion to your health care professional. Fever and chills usually do not happen with  later infusions. Do not become pregnant while taking this medicine or for 7 months after stopping it. Women should inform their doctor if they wish to become pregnant or think they might be pregnant. Women of child-bearing potential will need to have a negative pregnancy test before starting this medicine. There is a potential for serious side effects to an unborn child. Talk to your health care professional or pharmacist for more information. Do not breast-feed an infant while taking this medicine or for 7 months after stopping it. Women must use effective birth control with this medicine. What side effects may I notice from receiving this medicine? Side effects that you should report to your doctor or health care professional as soon as possible:  allergic reactions like skin rash, itching or hives, swelling of the face, lips, or tongue  chest pain or palpitations  cough  dizziness  feeling faint or lightheaded, falls  fever  general ill feeling or flu-like symptoms  signs of worsening heart failure like breathing problems; swelling in your legs and feet  unusually weak or tired Side effects that usually do not require medical attention (report to your doctor or health care professional if they continue or are bothersome):  bone pain  changes in taste  diarrhea  joint pain  nausea/vomiting  weight loss This list may not describe all possible side effects. Call your doctor for medical advice about side effects. You may report side effects to FDA at 1-800-FDA-1088. Where should I keep my medicine? This drug is given in a hospital or clinic and will not be stored at home. NOTE: This sheet is a summary. It may not cover all possible information. If you have questions about this medicine, talk to your doctor, pharmacist, or health care provider.  2021 Elsevier/Gold Standard (2016-06-18 14:37:52)

## 2020-11-21 NOTE — Addendum Note (Signed)
Addended by: Kern Alberta on: 11/21/2020 04:06 PM   Modules accepted: Orders

## 2020-11-21 NOTE — Progress Notes (Signed)
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 Vicente Males which showed a large nearly obstructing mass in the lower third of the esophagus 35 cm from incisors. This was biopsied and was consistent with adenocarcinoma.HER-2 positive. PD-l1 <1 %, TMB HIGH  Presently patient reports dysphagia but is able to eat cereal or soft food. He has lost about 13 pounds in the last month itself. Denies any significant pain. He recently retired after many years of service and is otherwise independent of his ADLs and IADLs.  PET CT scan on 06/26/2020 showed circumferential distal esophageal mass with an SUV of 13.3 extending over 5.5 cm. Right lower paratracheal node is 0.6 cm with an SUV of 3.1. Hypermetabolic right gastric lymph nodes including 1.1 cm node with an SUV of 5.8. Numerous hypermetabolic lesions throughout the liver somewhat confluent around the periphery which were hypermetabolic between 8 and 9. Faintly hypermetabolic left adrenal gland  NGS testing showed high tumor mutational burden CPS score less than 1.CD9-NRG1 fusion.BRAF 581L, CCN E1 gain. EMLA for fusion of BX [id number], FGF 23 gain, FGF 6 gain, MET R988C, T p53 S1 74F.ERBB2 gain but no other actionable  mutations  Patient currently on first-line FOLFOX/trastuzumab/Keytruda  Interval history-patient is tolerating chemotherapy very well without any significant side effects.  Denies any tingling numbness in his hands and feet.  Denies any significant difficulty swallowing  ECOG PS- 0 Pain scale- 0   Review of systems- Review of Systems  Constitutional: Negative for chills, fever, malaise/fatigue and weight loss.  HENT: Negative for congestion, ear discharge and nosebleeds.   Eyes: Negative for blurred vision.  Respiratory: Negative for cough, hemoptysis, sputum production, shortness of breath and wheezing.   Cardiovascular: Negative for chest pain, palpitations, orthopnea and claudication.  Gastrointestinal: Negative for abdominal pain, blood in stool, constipation, diarrhea, heartburn, melena, nausea and vomiting.  Genitourinary: Negative for dysuria, flank pain, frequency, hematuria and urgency.  Musculoskeletal: Negative for back pain, joint pain and myalgias.  Skin: Negative for rash.  Neurological: Negative for dizziness, tingling, focal weakness, seizures, weakness and headaches.  Endo/Heme/Allergies: Does not bruise/bleed easily.  Psychiatric/Behavioral: Negative for depression and suicidal ideas. The patient does not have insomnia.        No Known Allergies   Past Medical History:  Diagnosis Date  . Esophageal adenocarcinoma ([place]) 06/23/2020  . Family history of brain cancer   . Family history of colon cancer   . Family history of melanoma   . Family history of stomach cancer   . GERD (gastroesophageal reflux disease)   . History of kidney stones   . Nephrolithiasis    [facility]  . Pancreatitis, acute   . Personal history of colonic polyps    Dr Nicolasa Ducking  . Psoriasis      Past Surgical History:  Procedure Laterality Date  . COLONOSCOPY    .  COLONOSCOPY WITH PROPOFOL N/A 03/31/2020   Procedure: COLONOSCOPY WITH PROPOFOL;  Surgeon: Jonathon Bellows, MD;  Location: [facility]  ENDOSCOPY;  Service: Gastroenterology;  Laterality: N/A;  . ERCP    . ESOPHAGOGASTRODUODENOSCOPY (EGD) WITH PROPOFOL N/A 06/19/2020   Procedure: ESOPHAGOGASTRODUODENOSCOPY (EGD) WITH PROPOFOL;  Surgeon: Jonathon Bellows, MD;  Location: [facility] ENDOSCOPY;  Service: Gastroenterology;  Laterality: N/A;  . groin surgery     as a child  . PORTA CATH INSERTION N/A 06/28/2020   Procedure: PORTA CATH INSERTION;  Surgeon: Algernon Huxley, MD;  Location: [facility] CV LAB;  Service: Cardiovascular;  Laterality: N/A;  . SHOULDER SURGERY      Social History   Socioeconomic History  . Marital status: Married    Spouse name: Not on file  . Number of children: 2  . Years of education: Not on file  . Highest education level: Not on file  Occupational History  . 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 Smoking status: Former Smoker    Types: Cigarettes    Quit date: 07/08/1988    Years since quitting: 32.3  . Smokeless tobacco: Never Used  Vaping Use  . Vaping Use: Never used  Substance and Sexual Activity  . Alcohol use: No    Comment: recovering alcoholic for 13 years  . Drug use: No  . Sexual activity: Not Currently  Other Topics Concern  . Not on file  Social History Narrative   ** Merged History Encounter **       Social Determinants of Health   Financial Resource Strain: Not on file  Food Insecurity: Not on file  Transportation Needs: Not on file  Physical Activity: Not on file  Stress: Not on file  Social Connections: Not on file  Intimate Partner Violence: Not on file    Family History  Problem Relation Age of Onset  . Stomach cancer Mother   . Diabetes Brother   . Hypertension Brother   . Hypertension Father   . Prostate cancer Father   . Colon cancer Sister   . Cancer Other        either cervical or ovarian, d. 83s  . Brain cancer Sister      Current Outpatient Medications:  .  dexamethasone (DECADRON) 4 MG tablet, Take 2 tablets  (8 mg total) by mouth daily. Start the day after chemotherapy for 2 days. Take with food., Disp: 30 tablet, Rfl: 1 .  lidocaine-prilocaine (EMLA) cream, Apply 1 application topically as needed. Apply small amount to port site at least 1 hour prior to it being accessed, cover with plastic wrap, Disp: 30 g, Rfl: 3 .  LORazepam (ATIVAN) 0.5 MG tablet, Take 1 tablet (0.5 mg total) by mouth every 6 (six) hours as needed (Nausea or vomiting). (Patient not taking: No sig reported), Disp: 30 tablet, Rfl: 0 .  ondansetron (ZOFRAN) 8 MG tablet, Take 1 tablet (8 mg total) by mouth 2 (two) times daily as needed for refractory nausea / vomiting. Start on day 3 after chemotherapy. (Patient not taking: No sig reported), Disp: 30 tablet, Rfl: 1 .  oxyCODONE (OXY IR/ROXICODONE) 5 MG immediate release tablet, Take 1 tablet (5 mg total) by mouth every 8 (eight) hours as needed for severe pain. (Patient not taking: No sig reported), Disp: 60 tablet, Rfl: 0 .  pantoprazole (PROTONIX) 40 MG tablet, TAKE 1 TABLET BY MOUTH TWICE A DAY BEFORE MEALS, Disp: 60 tablet, Rfl: 3 .  prochlorperazine (  COMPAZINE) 10 MG tablet, Take 1 tablet (10 mg total) by mouth every 6 (six) hours as needed (Nausea or vomiting). (Patient not taking: No sig reported), Disp: 30 tablet, Rfl: 1 No current facility-administered medications for this visit.  Facility-Administered Medications Ordered in Other Visits:  .  heparin lock flush 100 unit/mL, 500 Units, Intravenous, Once, [name], MD  Physical exam:  Vitals:   11/21/20 0918  BP: 130/73  Pulse: 72  Resp: 16  Temp: 98 F (36.7 C)  TempSrc: Oral  Weight: 195 lb (88.5 kg)  Height: _0  (1.803 m)   Physical Exam Cardiovascular:     Rate and Rhythm: Normal rate.     Heart sounds: Normal heart sounds.  Pulmonary:     Effort: Pulmonary effort is normal.  Skin:    General: Skin is warm and dry.  Neurological:     Mental Status: He is alert and oriented to person, place, and time.       CMP Latest Ref Rng & Units 11/07/2020  Glucose 70 - 99 mg/dL 140(H)  BUN 8 - 23 mg/dL 14  Creatinine 0.61 - 1.24 mg/dL 0.93  Sodium 135 - 145 mmol/L 137  Potassium 3.5 - 5.1 mmol/L 3.8  Chloride 98 - 111 mmol/L 104  CO2 22 - 32 mmol/L 23  Calcium 8.9 - 10.3 mg/dL 9.0  Total Protein 6.5 - 8.1 g/dL 6.7  Total Bilirubin 0.3 - 1.2 mg/dL 0.9  Alkaline Phos 38 - 126 U/L 143(H)  AST 15 - 41 U/L 31  ALT 0 - 44 U/L 27   CBC Latest Ref Rng & Units 11/07/2020  WBC 4.0 - 10.5 K/uL 6.0  Hemoglobin 13.0 - 17.0 g/dL 13.3  Hematocrit 39.0 - 52.0 % 40.7  Platelets 150 - 400 K/uL 110(L)      Assessment and plan- Patient is a 65 y.o. 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   He is here for on treatment assessment prior to cycle 11 of palliative FOLFOX trastuzumab  Counts okay to proceed with cycle 11 of palliative FOLFOX trastuzumab chemotherapy today with pump DC on day 3.  He receives [place] on that day.  He will be seen by covering MD/NP in 2 weeks for cycle 12 and I will see him back in 4 weeks for cycle 13 when he will also receive Keytruda.  He has been receiving Keytruda every 6 weeks.  Recent scans in April 2022 showed continued response to treatment.  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  Continue to monitor.  Plan to discontinue oxaliplatin after 12 cycles   Visit Diagnosis 1. Esophageal adenocarcinoma ([place])   2. Encounter for antineoplastic chemotherapy   3. Encounter for monoclonal antibody treatment for malignancy   4. Chemotherapy-induced thrombocytopenia      Dr. Randa Evens, MD, MPH [facility] [phone] 11/21/2020 8:48 AM

## 2020-11-22 LAB — CEA: CEA: 5.5 ng/mL — ABNORMAL HIGH (ref 0.0–4.7)

## 2020-11-23 ENCOUNTER — Other Ambulatory Visit: Payer: Self-pay

## 2020-11-23 ENCOUNTER — Inpatient Hospital Stay: Payer: Medicare Other

## 2020-11-23 DIAGNOSIS — C159 Malignant neoplasm of esophagus, unspecified: Secondary | ICD-10-CM

## 2020-11-23 DIAGNOSIS — Z5112 Encounter for antineoplastic immunotherapy: Secondary | ICD-10-CM | POA: Diagnosis not present

## 2020-11-23 MED ORDER — HEPARIN SOD (PORK) LOCK FLUSH 100 UNIT/ML IV SOLN
500.0000 [IU] | Freq: Once | INTRAVENOUS | Status: AC | PRN
  Administered 2020-11-23: 500 [IU]
  Filled 2020-11-23: qty 5

## 2020-11-23 MED ORDER — HEPARIN SOD (PORK) LOCK FLUSH 100 UNIT/ML IV SOLN
INTRAVENOUS | Status: AC
  Filled 2020-11-23: qty 5

## 2020-11-23 MED ORDER — SODIUM CHLORIDE 0.9% FLUSH
10.0000 mL | INTRAVENOUS | Status: DC | PRN
  Administered 2020-11-23: 10 mL
  Filled 2020-11-23: qty 10

## 2020-11-23 MED ORDER — PEGFILGRASTIM-CBQV 6 MG/0.6ML ~~LOC~~ SOSY
6.0000 mg | PREFILLED_SYRINGE | Freq: Once | SUBCUTANEOUS | Status: AC
  Administered 2020-11-23: 6 mg via SUBCUTANEOUS
  Filled 2020-11-23: qty 0.6

## 2020-12-05 ENCOUNTER — Inpatient Hospital Stay: Payer: Medicare Other

## 2020-12-05 ENCOUNTER — Encounter: Payer: Self-pay | Admitting: Oncology

## 2020-12-05 ENCOUNTER — Other Ambulatory Visit: Payer: Self-pay

## 2020-12-05 ENCOUNTER — Inpatient Hospital Stay (HOSPITAL_BASED_OUTPATIENT_CLINIC_OR_DEPARTMENT_OTHER): Payer: Medicare Other | Admitting: Oncology

## 2020-12-05 VITALS — BP 147/79 | HR 73 | Temp 97.0°F | Resp 16 | Wt 194.0 lb

## 2020-12-05 DIAGNOSIS — Z5111 Encounter for antineoplastic chemotherapy: Secondary | ICD-10-CM

## 2020-12-05 DIAGNOSIS — T451X5A Adverse effect of antineoplastic and immunosuppressive drugs, initial encounter: Secondary | ICD-10-CM

## 2020-12-05 DIAGNOSIS — C159 Malignant neoplasm of esophagus, unspecified: Secondary | ICD-10-CM

## 2020-12-05 DIAGNOSIS — Z5112 Encounter for antineoplastic immunotherapy: Secondary | ICD-10-CM | POA: Diagnosis not present

## 2020-12-05 DIAGNOSIS — G620 Drug-induced polyneuropathy: Secondary | ICD-10-CM

## 2020-12-05 DIAGNOSIS — R21 Rash and other nonspecific skin eruption: Secondary | ICD-10-CM | POA: Diagnosis not present

## 2020-12-05 LAB — CBC WITH DIFFERENTIAL/PLATELET
Abs Immature Granulocytes: 0.08 10*3/uL — ABNORMAL HIGH (ref 0.00–0.07)
Basophils Absolute: 0 10*3/uL (ref 0.0–0.1)
Basophils Relative: 1 %
Eosinophils Absolute: 0.4 10*3/uL (ref 0.0–0.5)
Eosinophils Relative: 6 %
HCT: 39.2 % (ref 39.0–52.0)
Hemoglobin: 13 g/dL (ref 13.0–17.0)
Immature Granulocytes: 1 %
Lymphocytes Relative: 17 %
Lymphs Abs: 1 10*3/uL (ref 0.7–4.0)
MCH: 30.8 pg (ref 26.0–34.0)
MCHC: 33.2 g/dL (ref 30.0–36.0)
MCV: 92.9 fL (ref 80.0–100.0)
Monocytes Absolute: 0.6 10*3/uL (ref 0.1–1.0)
Monocytes Relative: 11 %
Neutro Abs: 3.7 10*3/uL (ref 1.7–7.7)
Neutrophils Relative %: 64 %
Platelets: 99 10*3/uL — ABNORMAL LOW (ref 150–400)
RBC: 4.22 MIL/uL (ref 4.22–5.81)
RDW: 15.5 % (ref 11.5–15.5)
WBC: 5.8 10*3/uL (ref 4.0–10.5)
nRBC: 0 % (ref 0.0–0.2)

## 2020-12-05 LAB — COMPREHENSIVE METABOLIC PANEL
ALT: 30 U/L (ref 0–44)
AST: 36 U/L (ref 15–41)
Albumin: 3.9 g/dL (ref 3.5–5.0)
Alkaline Phosphatase: 156 U/L — ABNORMAL HIGH (ref 38–126)
Anion gap: 10 (ref 5–15)
BUN: 10 mg/dL (ref 8–23)
CO2: 22 mmol/L (ref 22–32)
Calcium: 8.9 mg/dL (ref 8.9–10.3)
Chloride: 105 mmol/L (ref 98–111)
Creatinine, Ser: 0.86 mg/dL (ref 0.61–1.24)
GFR, Estimated: >60 mL/min (ref >60)
Glucose, Bld: 136 mg/dL — ABNORMAL HIGH (ref 70–99)
Potassium: 3.7 mmol/L (ref 3.5–5.1)
Sodium: 137 mmol/L (ref 135–145)
Total Bilirubin: 1.2 mg/dL (ref 0.3–1.2)
Total Protein: 6.6 g/dL (ref 6.5–8.1)

## 2020-12-05 MED ORDER — FLUOROURACIL CHEMO INJECTION 2.5 GM/50ML
400.0000 mg/m2 | Freq: Once | INTRAVENOUS | Status: AC
  Administered 2020-12-05: 850 mg via INTRAVENOUS
  Filled 2020-12-05: qty 17

## 2020-12-05 MED ORDER — ACETAMINOPHEN 325 MG PO TABS
650.0000 mg | ORAL_TABLET | Freq: Once | ORAL | Status: AC
Start: 2020-12-05 — End: 2020-12-05
  Administered 2020-12-05: 650 mg via ORAL
  Filled 2020-12-05: qty 2

## 2020-12-05 MED ORDER — PALONOSETRON HCL INJECTION 0.25 MG/5ML
0.2500 mg | Freq: Once | INTRAVENOUS | Status: AC
  Administered 2020-12-05: 0.25 mg via INTRAVENOUS
  Filled 2020-12-05: qty 5

## 2020-12-05 MED ORDER — SODIUM CHLORIDE 0.9% FLUSH
10.0000 mL | Freq: Once | INTRAVENOUS | Status: AC
  Administered 2020-12-05: 10 mL via INTRAVENOUS
  Filled 2020-12-05: qty 10

## 2020-12-05 MED ORDER — LEUCOVORIN CALCIUM INJECTION 350 MG
850.0000 mg | Freq: Once | INTRAVENOUS | Status: AC
  Administered 2020-12-05: 850 mg via INTRAVENOUS
  Filled 2020-12-05: qty 25

## 2020-12-05 MED ORDER — SODIUM CHLORIDE 0.9 % IV SOLN
5000.0000 mg | INTRAVENOUS | Status: DC
  Administered 2020-12-05: 5000 mg via INTRAVENOUS
  Filled 2020-12-05: qty 100

## 2020-12-05 MED ORDER — OXALIPLATIN CHEMO INJECTION 100 MG/20ML
48.0000 mg/m2 | Freq: Once | INTRAVENOUS | Status: AC
  Administered 2020-12-05: 100 mg via INTRAVENOUS
  Filled 2020-12-05: qty 20

## 2020-12-05 MED ORDER — SODIUM CHLORIDE 0.9 % IV SOLN
Freq: Once | INTRAVENOUS | Status: AC
Start: 2020-12-05 — End: 2020-12-05
  Filled 2020-12-05: qty 250

## 2020-12-05 MED ORDER — TRASTUZUMAB-ANNS CHEMO 150 MG IV SOLR
4.0000 mg/kg | Freq: Once | INTRAVENOUS | Status: AC
  Administered 2020-12-05: 357 mg via INTRAVENOUS
  Filled 2020-12-05: qty 17

## 2020-12-05 MED ORDER — SODIUM CHLORIDE 0.9 % IV SOLN
10.0000 mg | Freq: Once | INTRAVENOUS | Status: AC
  Administered 2020-12-05: 10 mg via INTRAVENOUS
  Filled 2020-12-05: qty 10

## 2020-12-05 MED ORDER — DEXTROSE 5 % IV SOLN
Freq: Once | INTRAVENOUS | Status: AC
Start: 2020-12-05 — End: 2020-12-05
  Filled 2020-12-05: qty 250

## 2020-12-05 MED ORDER — DIPHENHYDRAMINE HCL 50 MG/ML IJ SOLN
50.0000 mg | Freq: Once | INTRAMUSCULAR | Status: AC
  Administered 2020-12-05: 50 mg via INTRAVENOUS
  Filled 2020-12-05: qty 1

## 2020-12-05 NOTE — Patient Instructions (Signed)
[facility]    Discharge Instructions: Thank you for choosing [facility] to provide your oncology and hematology care.  If you have a lab appointment with the [facility], please go directly to the [facility] and check in at the registration area.  Wear comfortable clothing and clothing appropriate for easy access to any Portacath or PICC line.   We strive to give you quality time with your provider. You may need to reschedule your appointment if you arrive late (15 or more minutes).  Arriving late affects you and other patients whose appointments are after yours.  Also, if you miss three or more appointments without notifying the office, you may be dismissed from the clinic at the provider's discretion.      For prescription refill requests, have your pharmacy contact our office and allow 72 hours for refills to be completed.    Today you received the following chemotherapy and/or immunotherapy agents Oxaliplatin, Kanjinti, Leucovorin, Fluorouracil   Fluorouracil, 5-FU injection What is this medicine? FLUOROURACIL, 5-FU (flure oh YOOR a sil) is a chemotherapy drug. It slows the growth of cancer cells. This medicine is used to treat many types of cancer like breast cancer, colon or rectal cancer, pancreatic cancer, and stomach cancer. This medicine may be used for other purposes; ask your health care provider or pharmacist if you have questions. COMMON BRAND NAME(S): Adrucil What should I tell my health care provider before I take this medicine? They need to know if you have any of these conditions:  blood disorders  dihydropyrimidine dehydrogenase (DPD) deficiency  infection (especially a virus infection such as chickenpox, cold sores, or herpes)  kidney disease  liver disease  malnourished, poor nutrition  recent or ongoing radiation therapy  an unusual or allergic reaction to fluorouracil, other chemotherapy, other  medicines, foods, dyes, or preservatives  pregnant or trying to get pregnant  breast-feeding How should I use this medicine? This drug is given as an infusion or injection into a vein. It is administered in a hospital or clinic by a specially trained health care professional. Talk to your pediatrician regarding the use of this medicine in children. Special care may be needed. Overdosage: If you think you have taken too much of this medicine contact a poison control center or emergency room at once. NOTE: This medicine is only for you. Do not share this medicine with others. What if I miss a dose? It is important not to miss your dose. Call your doctor or health care professional if you are unable to keep an appointment. What may interact with this medicine? Do not take this medicine with any of the following medications:  live virus vaccines This medicine may also interact with the following medications:  medicines that treat or prevent blood clots like warfarin, enoxaparin, and dalteparin This list may not describe all possible interactions. Give your health care provider a list of all the medicines, herbs, non-prescription drugs, or dietary supplements you use. Also tell them if you smoke, drink alcohol, or use illegal drugs. Some items may interact with your medicine. What should I watch for while using this medicine? Visit your doctor for checks on your progress. This drug may make you feel generally unwell. This is not uncommon, as chemotherapy can affect healthy cells as well as cancer cells. Report any side effects. Continue your course of treatment even though you feel ill unless your doctor tells you to stop. In some cases, you may  be given additional medicines to help with side effects. Follow all directions for their use. Call your doctor or health care professional for advice if you get a fever, chills or sore throat, or other symptoms of a cold or flu. Do not treat yourself. This  drug decreases your body's ability to fight infections. Try to avoid being around people who are sick. This medicine may increase your risk to bruise or bleed. Call your doctor or health care professional if you notice any unusual bleeding. Be careful brushing and flossing your teeth or using a toothpick because you may get an infection or bleed more easily. If you have any dental work done, tell your dentist you are receiving this medicine. Avoid taking products that contain aspirin, acetaminophen, ibuprofen, naproxen, or ketoprofen unless instructed by your doctor. These medicines may hide a fever. Do not become pregnant while taking this medicine. Women should inform their doctor if they wish to become pregnant or think they might be pregnant. There is a potential for serious side effects to an unborn child. Talk to your health care professional or pharmacist for more information. Do not breast-feed an infant while taking this medicine. Men should inform their doctor if they wish to father a child. This medicine may lower sperm counts. Do not treat diarrhea with over the counter products. Contact your doctor if you have diarrhea that lasts more than 2 days or if it is severe and watery. This medicine can make you more sensitive to the sun. Keep out of the sun. If you cannot avoid being in the sun, wear protective clothing and use sunscreen. Do not use sun lamps or tanning beds/booths. What side effects may I notice from receiving this medicine? Side effects that you should report to your doctor or health care professional as soon as possible:  allergic reactions like skin rash, itching or hives, swelling of the face, lips, or tongue  low blood counts - this medicine may decrease the number of white blood cells, red blood cells and platelets. You may be at increased risk for infections and bleeding.  signs of infection - fever or chills, cough, sore throat, pain or difficulty passing urine  signs  of decreased platelets or bleeding - bruising, pinpoint red spots on the skin, black, tarry stools, blood in the urine  signs of decreased red blood cells - unusually weak or tired, fainting spells, lightheadedness  breathing problems  changes in vision  chest pain  mouth sores  nausea and vomiting  pain, swelling, redness at site where injected  pain, tingling, numbness in the hands or feet  redness, swelling, or sores on hands or feet  stomach pain  unusual bleeding Side effects that usually do not require medical attention (report to your doctor or health care professional if they continue or are bothersome):  changes in finger or toe nails  diarrhea  dry or itchy skin  hair loss  headache  loss of appetite  sensitivity of eyes to the light  stomach upset  unusually teary eyes This list may not describe all possible side effects. Call your doctor for medical advice about side effects. You may report side effects to FDA at 1-800-FDA-1088. Where should I keep my medicine? This drug is given in a hospital or clinic and will not be stored at home. NOTE: This sheet is a summary. It may not cover all possible information. If you have questions about this medicine, talk to your doctor, pharmacist, or health care provider.  2021 Elsevier/Gold Standard (2019-05-25 15:00:03) Leucovorin injection What is this medicine? LEUCOVORIN (loo koe VOR in) is used to prevent or treat the harmful effects of some medicines. This medicine is used to treat anemia caused by a low amount of folic acid in the body. It is also used with 5-fluorouracil (5-FU) to treat colon cancer. This medicine may be used for other purposes; ask your health care provider or pharmacist if you have questions. What should I tell my health care provider before I take this medicine? They need to know if you have any of these conditions:  anemia from low levels of vitamin B-12 in the blood  an unusual or  allergic reaction to leucovorin, folic acid, other medicines, foods, dyes, or preservatives  pregnant or trying to get pregnant  breast-feeding How should I use this medicine? This medicine is for injection into a muscle or into a vein. It is given by a health care professional in a hospital or clinic setting. Talk to your pediatrician regarding the use of this medicine in children. Special care may be needed. Overdosage: If you think you have taken too much of this medicine contact a poison control center or emergency room at once. NOTE: This medicine is only for you. Do not share this medicine with others. What if I miss a dose? This does not apply. What may interact with this medicine?  capecitabine  fluorouracil  phenobarbital  phenytoin  primidone  trimethoprim-sulfamethoxazole This list may not describe all possible interactions. Give your health care provider a list of all the medicines, herbs, non-prescription drugs, or dietary supplements you use. Also tell them if you smoke, drink alcohol, or use illegal drugs. Some items may interact with your medicine. What should I watch for while using this medicine? Your condition will be monitored carefully while you are receiving this medicine. This medicine may increase the side effects of 5-fluorouracil, 5-FU. Tell your doctor or health care professional if you have diarrhea or mouth sores that do not get better or that get worse. What side effects may I notice from receiving this medicine? Side effects that you should report to your doctor or health care professional as soon as possible:  allergic reactions like skin rash, itching or hives, swelling of the face, lips, or tongue  breathing problems  fever, infection  mouth sores  unusual bleeding or bruising  unusually weak or tired Side effects that usually do not require medical attention (report to your doctor or health care professional if they continue or are  bothersome):  constipation or diarrhea  loss of appetite  nausea, vomiting This list may not describe all possible side effects. Call your doctor for medical advice about side effects. You may report side effects to FDA at 1-800-FDA-1088. Where should I keep my medicine? This drug is given in a hospital or clinic and will not be stored at home. NOTE: This sheet is a summary. It may not cover all possible information. If you have questions about this medicine, talk to your doctor, pharmacist, or health care provider.  2021 Elsevier/Gold Standard (2007-12-29 16:50:29) Trastuzumab injection for infusion What is this medicine? TRASTUZUMAB (tras TOO zoo mab) is a monoclonal antibody. It is used to treat breast cancer and stomach cancer. This medicine may be used for other purposes; ask your health care provider or pharmacist if you have questions. COMMON BRAND NAME(S): Herceptin, Galvin Proffer, Trazimera What should I tell my health care provider before I take this medicine? They  need to know if you have any of these conditions:  heart disease  heart failure  lung or breathing disease, like asthma  an unusual or allergic reaction to trastuzumab, benzyl alcohol, or other medications, foods, dyes, or preservatives  pregnant or trying to get pregnant  breast-feeding How should I use this medicine? This drug is given as an infusion into a vein. It is administered in a hospital or clinic by a specially trained health care professional. Talk to your pediatrician regarding the use of this medicine in children. This medicine is not approved for use in children. Overdosage: If you think you have taken too much of this medicine contact a poison control center or emergency room at once. NOTE: This medicine is only for you. Do not share this medicine with others. What if I miss a dose? It is important not to miss a dose. Call your doctor or health care professional if you  are unable to keep an appointment. What may interact with this medicine? This medicine may interact with the following medications:  certain types of chemotherapy, such as daunorubicin, doxorubicin, epirubicin, and idarubicin This list may not describe all possible interactions. Give your health care provider a list of all the medicines, herbs, non-prescription drugs, or dietary supplements you use. Also tell them if you smoke, drink alcohol, or use illegal drugs. Some items may interact with your medicine. What should I watch for while using this medicine? Visit your doctor for checks on your progress. Report any side effects. Continue your course of treatment even though you feel ill unless your doctor tells you to stop. Call your doctor or health care professional for advice if you get a fever, chills or sore throat, or other symptoms of a cold or flu. Do not treat yourself. Try to avoid being around people who are sick. You may experience fever, chills and shaking during your first infusion. These effects are usually mild and can be treated with other medicines. Report any side effects during the infusion to your health care professional. Fever and chills usually do not happen with later infusions. Do not become pregnant while taking this medicine or for 7 months after stopping it. Women should inform their doctor if they wish to become pregnant or think they might be pregnant. Women of child-bearing potential will need to have a negative pregnancy test before starting this medicine. There is a potential for serious side effects to an unborn child. Talk to your health care professional or pharmacist for more information. Do not breast-feed an infant while taking this medicine or for 7 months after stopping it. Women must use effective birth control with this medicine. What side effects may I notice from receiving this medicine? Side effects that you should report to your doctor or health care  professional as soon as possible:  allergic reactions like skin rash, itching or hives, swelling of the face, lips, or tongue  chest pain or palpitations  cough  dizziness  feeling faint or lightheaded, falls  fever  general ill feeling or flu-like symptoms  signs of worsening heart failure like breathing problems; swelling in your legs and feet  unusually weak or tired Side effects that usually do not require medical attention (report to your doctor or health care professional if they continue or are bothersome):  bone pain  changes in taste  diarrhea  joint pain  nausea/vomiting  weight loss This list may not describe all possible side effects. Call your doctor for medical advice  about side effects. You may report side effects to FDA at 1-800-FDA-1088. Where should I keep my medicine? This drug is given in a hospital or clinic and will not be stored at home. NOTE: This sheet is a summary. It may not cover all possible information. If you have questions about this medicine, talk to your doctor, pharmacist, or health care provider.  2021 Elsevier/Gold Standard (2016-06-18 14:37:52) Oxaliplatin Injection What is this medicine? OXALIPLATIN (ox AL i PLA tin) is a chemotherapy drug. It targets fast dividing cells, like cancer cells, and causes these cells to die. This medicine is used to treat cancers of the colon and rectum, and many other cancers. This medicine may be used for other purposes; ask your health care provider or pharmacist if you have questions. COMMON BRAND NAME(S): Eloxatin What should I tell my health care provider before I take this medicine? They need to know if you have any of these conditions:  heart disease  history of irregular heartbeat  liver disease  low blood counts, like white cells, platelets, or red blood cells  lung or breathing disease, like asthma  take medicines that treat or prevent blood clots  tingling of the fingers or toes,  or other nerve disorder  an unusual or allergic reaction to oxaliplatin, other chemotherapy, other medicines, foods, dyes, or preservatives  pregnant or trying to get pregnant  breast-feeding How should I use this medicine? This drug is given as an infusion into a vein. It is administered in a hospital or clinic by a specially trained health care professional. Talk to your pediatrician regarding the use of this medicine in children. Special care may be needed. Overdosage: If you think you have taken too much of this medicine contact a poison control center or emergency room at once. NOTE: This medicine is only for you. Do not share this medicine with others. What if I miss a dose? It is important not to miss a dose. Call your doctor or health care professional if you are unable to keep an appointment. What may interact with this medicine? Do not take this medicine with any of the following medications:  cisapride  dronedarone  pimozide  thioridazine This medicine may also interact with the following medications:  aspirin and aspirin-like medicines  certain medicines that treat or prevent blood clots like warfarin, apixaban, dabigatran, and rivaroxaban  cisplatin  cyclosporine  diuretics  medicines for infection like acyclovir, adefovir, amphotericin B, bacitracin, cidofovir, foscarnet, ganciclovir, gentamicin, pentamidine, vancomycin  NSAIDs, medicines for pain and inflammation, like ibuprofen or naproxen  other medicines that prolong the QT interval (an abnormal heart rhythm)  pamidronate  zoledronic acid This list may not describe all possible interactions. Give your health care provider a list of all the medicines, herbs, non-prescription drugs, or dietary supplements you use. Also tell them if you smoke, drink alcohol, or use illegal drugs. Some items may interact with your medicine. What should I watch for while using this medicine? Your condition will be monitored  carefully while you are receiving this medicine. You may need blood work done while you are taking this medicine. This medicine may make you feel generally unwell. This is not uncommon as chemotherapy can affect healthy cells as well as cancer cells. Report any side effects. Continue your course of treatment even though you feel ill unless your healthcare professional tells you to stop. This medicine can make you more sensitive to cold. Do not drink cold drinks or use ice. Cover exposed skin  before coming in contact with cold temperatures or cold objects. When out in cold weather wear warm clothing and cover your mouth and nose to warm the air that goes into your lungs. Tell your doctor if you get sensitive to the cold. Do not become pregnant while taking this medicine or for 9 months after stopping it. Women should inform their health care professional if they wish to become pregnant or think they might be pregnant. Men should not father a child while taking this medicine and for 6 months after stopping it. There is potential for serious side effects to an unborn child. Talk to your health care professional for more information. Do not breast-feed a child while taking this medicine or for 3 months after stopping it. This medicine has caused ovarian failure in some women. This medicine may make it more difficult to get pregnant. Talk to your health care professional if you are concerned about your fertility. This medicine has caused decreased sperm counts in some men. This may make it more difficult to father a child. Talk to your health care professional if you are concerned about your fertility. This medicine may increase your risk of getting an infection. Call your health care professional for advice if you get a fever, chills, or sore throat, or other symptoms of a cold or flu. Do not treat yourself. Try to avoid being around people who are sick. Avoid taking medicines that contain aspirin,  acetaminophen, ibuprofen, naproxen, or ketoprofen unless instructed by your health care professional. These medicines may hide a fever. Be careful brushing or flossing your teeth or using a toothpick because you may get an infection or bleed more easily. If you have any dental work done, tell your dentist you are receiving this medicine. What side effects may I notice from receiving this medicine? Side effects that you should report to your doctor or health care professional as soon as possible:  allergic reactions like skin rash, itching or hives, swelling of the face, lips, or tongue  breathing problems  cough  low blood counts - this medicine may decrease the number of white blood cells, red blood cells, and platelets. You may be at increased risk for infections and bleeding  nausea, vomiting  pain, redness, or irritation at site where injected  pain, tingling, numbness in the hands or feet  signs and symptoms of bleeding such as bloody or black, tarry stools; red or dark brown urine; spitting up blood or brown material that looks like coffee grounds; red spots on the skin; unusual bruising or bleeding from the eyes, gums, or nose  signs and symptoms of a dangerous change in heartbeat or heart rhythm like chest pain; dizziness; fast, irregular heartbeat; palpitations; feeling faint or lightheaded; falls  signs and symptoms of infection like fever; chills; cough; sore throat; pain or trouble passing urine  signs and symptoms of liver injury like dark yellow or brown urine; general ill feeling or flu-like symptoms; light-colored stools; loss of appetite; nausea; right upper belly pain; unusually weak or tired; yellowing of the eyes or skin  signs and symptoms of low red blood cells or anemia such as unusually weak or tired; feeling faint or lightheaded; falls  signs and symptoms of muscle injury like dark urine; trouble passing urine or change in the amount of urine; unusually weak or  tired; muscle pain; back pain Side effects that usually do not require medical attention (report to your doctor or health care professional if they continue or  are bothersome):  changes in taste  diarrhea  gas  hair loss  loss of appetite  mouth sores This list may not describe all possible side effects. Call your doctor for medical advice about side effects. You may report side effects to FDA at 1-800-FDA-1088. Where should I keep my medicine? This drug is given in a hospital or clinic and will not be stored at home. NOTE: This sheet is a summary. It may not cover all possible information. If you have questions about this medicine, talk to your doctor, pharmacist, or health care provider.  2021 Elsevier/Gold Standard (2018-11-11 12:20:35)       To help prevent nausea and vomiting after your treatment, we encourage you to take your nausea medication as directed.  BELOW ARE SYMPTOMS THAT SHOULD BE REPORTED IMMEDIATELY: . *FEVER GREATER THAN 100.4 F (38 C) OR HIGHER . *CHILLS OR SWEATING . *NAUSEA AND VOMITING THAT IS NOT CONTROLLED WITH YOUR NAUSEA MEDICATION . *UNUSUAL SHORTNESS OF BREATH . *UNUSUAL BRUISING OR BLEEDING . *URINARY PROBLEMS (pain or burning when urinating, or frequent urination) . *BOWEL PROBLEMS (unusual diarrhea, constipation, pain near the anus) . TENDERNESS IN MOUTH AND THROAT WITH OR WITHOUT PRESENCE OF ULCERS (sore throat, sores in mouth, or a toothache) . UNUSUAL RASH, SWELLING OR PAIN  . UNUSUAL VAGINAL DISCHARGE OR ITCHING   Items with * indicate a potential emergency and should be followed up as soon as possible or go to the Emergency Department if any problems should occur.  Please show the CHEMOTHERAPY ALERT CARD or IMMUNOTHERAPY ALERT CARD at check-in to the Emergency Department and triage nurse.  Should you have questions after your visit or need to cancel or reschedule your appointment, please contact [facility]  [phone] and follow the prompts.  Office hours are 8:00 a.m. to 4:30 p.m. Monday - Friday. Please note that voicemails left after 4:00 p.m. may not be returned until the following business day.  We are closed weekends and major holidays. You have access to a nurse at all times for urgent questions. Please call the main number to the clinic [phone] and follow the prompts.  For any non-urgent questions, you may also contact your provider using MyChart. We now offer e-Visits for anyone 84 and older to request care online for non-urgent symptoms. For details visit [email or web address].   Also download the MyChart app! Go to the app store, search "MyChart", open the app, select [facility], and log in with your MyChart username and password.  Due to Covid, a mask is required upon entering the hospital/clinic. If you do not have a mask, one will be given to you upon arrival. For doctor visits, patients may have 1 support person aged 90 or older with them. For treatment visits, patients cannot have anyone with them due to current Covid guidelines and our immunocompromised population.

## 2020-12-05 NOTE — Progress Notes (Signed)
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 Vicente Males which showed a large nearly obstructing mass in the lower third of the esophagus 35 cm from incisors. This was biopsied and was consistent with adenocarcinoma.HER-2 positive. PD-l1 <1 %, TMB HIGH  Presently patient reports dysphagia but is able to eat cereal or soft food. He has lost about 13 pounds in the last month itself. Denies any significant pain. He recently retired after many years of service and is otherwise independent of his ADLs and IADLs.  PET CT scan on 06/26/2020 showed circumferential distal esophageal mass with an SUV of 13.3 extending over 5.5 cm. Right lower paratracheal node is 0.6 cm with an SUV of 3.1. Hypermetabolic right gastric lymph nodes including 1.1 cm node with an SUV of 5.8. Numerous hypermetabolic lesions throughout the liver somewhat confluent around the periphery which were hypermetabolic between 8 and 9. Faintly hypermetabolic left adrenal gland  NGS testing showed high tumor mutational burden CPS score less than 1.CD9-NRG1 fusion.BRAF 581L, CCN E1 gain. EMLA for fusion of BX [id number], FGF 23 gain, FGF 6 gain, MET R988C, T p53 S1 56F.ERBB2 gain but no other actionable mutations  Patient  currently on first-line FOLFOX/trastuzumab/Keytruda  Interval history-patient is tolerating chemotherapy well. He does have new BUE rash that is non itchy and non painful X 2-3 weeks. Feels its stable. He has tried Eucerin cream but it doesn't seem to help. He thinks it could be d/t working out in the sun. He has worsening peripheral neuropathy in upper and lower extremties and describes trouble picking up items at times.   ECOG PS- 0 Pain scale- 0   Review of systems- Review of Systems  Constitutional: Negative for chills, fever, malaise/fatigue and weight loss.  HENT: Negative for congestion, ear discharge and nosebleeds.   Eyes: Negative for blurred vision.  Respiratory: Negative for cough, hemoptysis, sputum production, shortness of breath and wheezing.   Cardiovascular: Negative for chest pain, palpitations, orthopnea and claudication.  Gastrointestinal: Negative for abdominal pain, blood in stool, constipation, diarrhea, heartburn, melena, nausea and vomiting.  Genitourinary: Negative for dysuria, flank pain, frequency, hematuria and urgency.  Musculoskeletal: Negative for back pain, joint pain and myalgias.  Skin: Positive for rash.  Neurological: Positive for sensory change. Negative for dizziness, tingling, focal weakness, seizures, weakness and headaches.  Endo/Heme/Allergies: Does not bruise/bleed easily.  Psychiatric/Behavioral: Negative for depression and suicidal ideas. The patient does not have insomnia.        No Known Allergies   Past Medical History:  Diagnosis Date  . Esophageal adenocarcinoma ([place]) 06/23/2020  . Family history of brain cancer   . Family history of colon cancer   . Family history of melanoma   . Family history of stomach cancer   . GERD (gastroesophageal reflux disease)   . History of kidney stones   . Nephrolithiasis  [facility]  . Pancreatitis, acute   . Personal history of colonic polyps    Dr Nicolasa Ducking  . Psoriasis      Past Surgical  History:  Procedure Laterality Date  . COLONOSCOPY    . COLONOSCOPY WITH PROPOFOL N/A 03/31/2020   Procedure: COLONOSCOPY WITH PROPOFOL;  Surgeon: Jonathon Bellows, MD;  Location: [facility] ENDOSCOPY;  Service: Gastroenterology;  Laterality: N/A;  . ERCP    . ESOPHAGOGASTRODUODENOSCOPY (EGD) WITH PROPOFOL N/A 06/19/2020   Procedure: ESOPHAGOGASTRODUODENOSCOPY (EGD) WITH PROPOFOL;  Surgeon: Jonathon Bellows, MD;  Location: [facility] ENDOSCOPY;  Service: Gastroenterology;  Laterality: N/A;  . groin surgery     as a child  . PORTA CATH INSERTION N/A 06/28/2020   Procedure: PORTA CATH INSERTION;  Surgeon: Algernon Huxley, MD;  Location: [place] CV LAB;  Service: Cardiovascular;  Laterality: N/A;  . SHOULDER SURGERY      Social History   Socioeconomic History  . Marital status: Married    Spouse name: Not on file  . Number of children: 2  . Years of education: Not on file  . Highest education level: Not on file  Occupational History  . 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 Smoking status: Former Smoker    Types: Cigarettes    Quit date: 07/08/1988    Years since quitting: 32.4  . Smokeless tobacco: Never Used  Vaping Use  . Vaping Use: Never used  Substance and Sexual Activity  . Alcohol use: No    Comment: recovering alcoholic for 13 years  . Drug use: No  . Sexual activity: Not Currently  Other Topics Concern  . Not on file  Social History Narrative   ** Merged History Encounter **       Social Determinants of Health   Financial Resource Strain: Not on file  Food Insecurity: Not on file  Transportation Needs: Not on file  Physical Activity: Not on file  Stress: Not on file  Social Connections: Not on file  Intimate Partner Violence: Not on file    Family History  Problem Relation Age of Onset  . Stomach cancer Mother   . Diabetes Brother   . Hypertension Brother   . Hypertension Father   . Prostate cancer Father   . Colon cancer Sister    . Cancer Other        either cervical or ovarian, d. 14s  . Brain cancer Sister      Current Outpatient Medications:  .  dexamethasone (DECADRON) 4 MG tablet, Take 2 tablets (8 mg total) by mouth daily. Start the day after chemotherapy for 2 days. Take with food., Disp: 30 tablet, Rfl: 1 .  lidocaine-prilocaine (EMLA) cream, Apply 1 application topically as needed. Apply small amount to port site at least 1 hour prior to it being accessed, cover with plastic wrap, Disp: 30 g, Rfl: 3 .  pantoprazole (PROTONIX) 40 MG tablet, TAKE 1 TABLET BY MOUTH TWICE A DAY BEFORE MEALS, Disp: 60 tablet, Rfl: 3 .  triamcinolone cream (KENALOG) 0.1 %, Apply 1 application topically 3 (three) times daily as needed., Disp: 300 each, Rfl: 1 .  LORazepam (ATIVAN) 0.5 MG tablet, Take 1 tablet (0.5 mg total) by mouth every 6 (six) hours as needed (Nausea or vomiting). (Patient not taking: No sig reported), Disp: 30 tablet, Rfl: 0 .  ondansetron (ZOFRAN) 8 MG tablet, Take 1 tablet (8 mg total) by mouth 2 (two) times daily as needed for  refractory nausea / vomiting. Start on day 3 after chemotherapy. (Patient not taking: No sig reported), Disp: 30 tablet, Rfl: 1 .  oxyCODONE (OXY IR/ROXICODONE) 5 MG immediate release tablet, Take 1 tablet (5 mg total) by mouth every 8 (eight) hours as needed for severe pain. (Patient not taking: No sig reported), Disp: 60 tablet, Rfl: 0 .  prochlorperazine (COMPAZINE) 10 MG tablet, Take 1 tablet (10 mg total) by mouth every 6 (six) hours as needed (Nausea or vomiting). (Patient not taking: No sig reported), Disp: 30 tablet, Rfl: 1  Physical exam:  Vitals:   12/05/20 0901  BP: (!) 147/79  Pulse: 73  Resp: 16  Temp: (!) 97 F (36.1 C)  TempSrc: Tympanic  SpO2: 100%  Weight: 194 lb (88 kg)   Physical Exam   CMP Latest Ref Rng & Units 12/05/2020  Glucose 70 - 99 mg/dL 136(H)  BUN 8 - 23 mg/dL 10  Creatinine 0.61 - 1.24 mg/dL 0.86  Sodium 135 - 145 mmol/L 137  Potassium 3.5 -  5.1 mmol/L 3.7  Chloride 98 - 111 mmol/L 105  CO2 22 - 32 mmol/L 22  Calcium 8.9 - 10.3 mg/dL 8.9  Total Protein 6.5 - 8.1 g/dL 6.6  Total Bilirubin 0.3 - 1.2 mg/dL 1.2  Alkaline Phos 38 - 126 U/L 156(H)  AST 15 - 41 U/L 36  ALT 0 - 44 U/L 30   CBC Latest Ref Rng & Units 12/05/2020  WBC 4.0 - 10.5 K/uL 5.8  Hemoglobin 13.0 - 17.0 g/dL 13.0  Hematocrit 39.0 - 52.0 % 39.2  Platelets 150 - 400 K/uL 99(L)    Assessment and plan- Patient is a 65 y.o. 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  He is here for on treatment assessment prior to cycle 12 of palliative FOLFOX trastuzumab.  Counts okay to proceed with cycle 12 of palliative FOLFOX trastuzumab chemotherapy today with pump DC on day 3.  He receives [place] on that day.  Consulted with Dr. Tasia Catchings and given worsening peripheral neuropathy we will dose reduce oxaliplatin by 25%.  This is his last cycle with oxaliplatin.  Plan is to discontinue after 12 cycles.  Rash: Appears to be limited to bilateral upper extremities.  It is not itchy and not painful.  There are no open sores.  Recommend long sleeves when outside while on treatment and Eucerin/triamcinolone cream to see if this is helpful.  If it worsens, patient to call clinic.  He is scheduled to return to clinic in 2 weeks for cycle 13 (add keytruda, d/c oxaliplatin) to see Dr. [name] and have lab work.  Repeat scans in July 2022.  Greater than 50% was spent in counseling and coordination of care with this patient including but not limited to discussion of the relevant topics above (See A&P) including, but not limited to diagnosis and management of acute and chronic medical conditions.     Visit Diagnosis 1. Esophageal adenocarcinoma ([place])   2. Chemotherapy-induced peripheral neuropathy ([place])   3. Rash and nonspecific skin eruption     Faythe Casa, NP 12/07/2020 11:11 AM

## 2020-12-05 NOTE — Progress Notes (Addendum)
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  Patient receiving folfox, keytruda, trastuzumab.  Met with patient during infusion. Patient reports that appetite has been fairly good but weight dropping.  Reports that he mainly eats 2 meals per day.  Yesterday ate egg, bacon, sausage, grits and toast for breakfast.  Lunch was pack of nabs. Supper was steak and baked potato. Drank 1 boost shake (20 g protein, ?? 240 calories).      Medications: reviewed  Labs: reviewed  Anthropometrics:   Weight 194 lb today  197 lb on 5/3 230 lb Jan 2021   NUTRITION DIAGNOSIS:  Inadequate oral intake continues with weight loss   INTERVENTION:  Recommend increased boost BID and switch to boost plus (360 calories, 14 g protein). Sample of boost plus given to try. Add more food items at lunch time meal.      MONITORING, EVALUATION, GOAL: weight trends, intake   NEXT VISIT: Tuesday, June 14 during infusion  Cory Bradly B. Zenia Resides, [place], [place] Registered Dietitian [phone] (mobile)

## 2020-12-05 NOTE — Progress Notes (Signed)
Labs reviewed with [name], NP and treatment team. Per NP to proceed with treatment. Treatment team and pt updated.   Cory [name] [employer]

## 2020-12-05 NOTE — Progress Notes (Signed)
Pt having rash on his arms only. He has hx psoriasis but feels that it could be the sun with his chemo. It started 3 weeks ago

## 2020-12-05 NOTE — Progress Notes (Signed)
Oxaliplatin dose reduced by 25% per NP

## 2020-12-06 ENCOUNTER — Telehealth: Payer: Self-pay | Admitting: *Deleted

## 2020-12-06 LAB — CEA: CEA: 5.8 ng/mL — ABNORMAL HIGH (ref 0.0–4.7)

## 2020-12-06 NOTE — Telephone Encounter (Signed)
Patient states that he was to have had a prescription sent to pharmacy for a rash on his arms yesterday, but nothing has been received by pharmacy for it. Please advise

## 2020-12-07 ENCOUNTER — Encounter: Payer: Self-pay | Admitting: Oncology

## 2020-12-07 ENCOUNTER — Inpatient Hospital Stay: Payer: Medicare Other | Attending: Oncology

## 2020-12-07 VITALS — BP 142/71 | HR 61 | Temp 97.5°F | Resp 18

## 2020-12-07 DIAGNOSIS — Z8249 Family history of ischemic heart disease and other diseases of the circulatory system: Secondary | ICD-10-CM | POA: Diagnosis not present

## 2020-12-07 DIAGNOSIS — Z5189 Encounter for other specified aftercare: Secondary | ICD-10-CM | POA: Insufficient documentation

## 2020-12-07 DIAGNOSIS — Z8042 Family history of malignant neoplasm of prostate: Secondary | ICD-10-CM | POA: Insufficient documentation

## 2020-12-07 DIAGNOSIS — R1310 Dysphagia, unspecified: Secondary | ICD-10-CM | POA: Insufficient documentation

## 2020-12-07 DIAGNOSIS — C787 Secondary malignant neoplasm of liver and intrahepatic bile duct: Secondary | ICD-10-CM | POA: Diagnosis not present

## 2020-12-07 DIAGNOSIS — Z833 Family history of diabetes mellitus: Secondary | ICD-10-CM | POA: Diagnosis not present

## 2020-12-07 DIAGNOSIS — Z808 Family history of malignant neoplasm of other organs or systems: Secondary | ICD-10-CM | POA: Diagnosis not present

## 2020-12-07 DIAGNOSIS — Z79899 Other long term (current) drug therapy: Secondary | ICD-10-CM | POA: Diagnosis not present

## 2020-12-07 DIAGNOSIS — C160 Malignant neoplasm of cardia: Secondary | ICD-10-CM | POA: Insufficient documentation

## 2020-12-07 DIAGNOSIS — Z5112 Encounter for antineoplastic immunotherapy: Secondary | ICD-10-CM | POA: Diagnosis not present

## 2020-12-07 DIAGNOSIS — R5383 Other fatigue: Secondary | ICD-10-CM | POA: Insufficient documentation

## 2020-12-07 DIAGNOSIS — Z5111 Encounter for antineoplastic chemotherapy: Secondary | ICD-10-CM | POA: Insufficient documentation

## 2020-12-07 DIAGNOSIS — Z800 Family history of malignant neoplasm of digestive organs: Secondary | ICD-10-CM | POA: Insufficient documentation

## 2020-12-07 DIAGNOSIS — C159 Malignant neoplasm of esophagus, unspecified: Secondary | ICD-10-CM

## 2020-12-07 MED ORDER — SODIUM CHLORIDE 0.9% FLUSH
10.0000 mL | INTRAVENOUS | Status: DC | PRN
  Administered 2020-12-07: 10 mL
  Filled 2020-12-07: qty 10

## 2020-12-07 MED ORDER — HEPARIN SOD (PORK) LOCK FLUSH 100 UNIT/ML IV SOLN
INTRAVENOUS | Status: AC
  Filled 2020-12-07: qty 5

## 2020-12-07 MED ORDER — TRIAMCINOLONE ACETONIDE 0.1 % EX CREA: 1.0000 "application " | TOPICAL_CREAM | Freq: Three times a day (TID) | CUTANEOUS | 1 refills | Status: DC | PRN

## 2020-12-07 MED ORDER — HEPARIN SOD (PORK) LOCK FLUSH 100 UNIT/ML IV SOLN
500.0000 [IU] | Freq: Once | INTRAVENOUS | Status: AC | PRN
  Administered 2020-12-07: 500 [IU]
  Filled 2020-12-07: qty 5

## 2020-12-07 MED ORDER — PEGFILGRASTIM-CBQV 6 MG/0.6ML ~~LOC~~ SOSY
6.0000 mg | PREFILLED_SYRINGE | Freq: Once | SUBCUTANEOUS | Status: AC
  Administered 2020-12-07: 6 mg via SUBCUTANEOUS
  Filled 2020-12-07: qty 0.6

## 2020-12-07 NOTE — Telephone Encounter (Signed)
Prescription sent by Lorretta Harp, NP

## 2020-12-19 ENCOUNTER — Inpatient Hospital Stay: Payer: Medicare Other

## 2020-12-19 ENCOUNTER — Encounter: Payer: Self-pay | Admitting: Oncology

## 2020-12-19 ENCOUNTER — Inpatient Hospital Stay (HOSPITAL_BASED_OUTPATIENT_CLINIC_OR_DEPARTMENT_OTHER): Payer: Medicare Other | Admitting: Oncology

## 2020-12-19 VITALS — BP 136/70 | HR 71 | Temp 98.0°F | Resp 16 | Ht 71.0 in | Wt 191.6 lb

## 2020-12-19 DIAGNOSIS — T451X5A Adverse effect of antineoplastic and immunosuppressive drugs, initial encounter: Secondary | ICD-10-CM | POA: Diagnosis not present

## 2020-12-19 DIAGNOSIS — L568 Other specified acute skin changes due to ultraviolet radiation: Secondary | ICD-10-CM | POA: Diagnosis not present

## 2020-12-19 DIAGNOSIS — C159 Malignant neoplasm of esophagus, unspecified: Secondary | ICD-10-CM | POA: Diagnosis not present

## 2020-12-19 DIAGNOSIS — Z5111 Encounter for antineoplastic chemotherapy: Secondary | ICD-10-CM

## 2020-12-19 DIAGNOSIS — D6959 Other secondary thrombocytopenia: Secondary | ICD-10-CM | POA: Diagnosis not present

## 2020-12-19 DIAGNOSIS — Z5112 Encounter for antineoplastic immunotherapy: Secondary | ICD-10-CM | POA: Diagnosis not present

## 2020-12-19 LAB — CBC WITH DIFFERENTIAL/PLATELET
Abs Immature Granulocytes: 0.03 10*3/uL (ref 0.00–0.07)
Basophils Absolute: 0 10*3/uL (ref 0.0–0.1)
Basophils Relative: 1 %
Eosinophils Absolute: 0.2 10*3/uL (ref 0.0–0.5)
Eosinophils Relative: 4 %
HCT: 39.4 % (ref 39.0–52.0)
Hemoglobin: 13.2 g/dL (ref 13.0–17.0)
Immature Granulocytes: 1 %
Lymphocytes Relative: 16 %
Lymphs Abs: 0.9 10*3/uL (ref 0.7–4.0)
MCH: 31.1 pg (ref 26.0–34.0)
MCHC: 33.5 g/dL (ref 30.0–36.0)
MCV: 92.9 fL (ref 80.0–100.0)
Monocytes Absolute: 0.6 10*3/uL (ref 0.1–1.0)
Monocytes Relative: 11 %
Neutro Abs: 3.7 10*3/uL (ref 1.7–7.7)
Neutrophils Relative %: 67 %
Platelets: 107 10*3/uL — ABNORMAL LOW (ref 150–400)
RBC: 4.24 MIL/uL (ref 4.22–5.81)
RDW: 15.7 % — ABNORMAL HIGH (ref 11.5–15.5)
WBC: 5.5 10*3/uL (ref 4.0–10.5)
nRBC: 0 % (ref 0.0–0.2)

## 2020-12-19 LAB — COMPREHENSIVE METABOLIC PANEL
ALT: 29 U/L (ref 0–44)
AST: 32 U/L (ref 15–41)
Albumin: 3.9 g/dL (ref 3.5–5.0)
Alkaline Phosphatase: 148 U/L — ABNORMAL HIGH (ref 38–126)
Anion gap: 9 (ref 5–15)
BUN: 12 mg/dL (ref 8–23)
CO2: 25 mmol/L (ref 22–32)
Calcium: 8.9 mg/dL (ref 8.9–10.3)
Chloride: 104 mmol/L (ref 98–111)
Creatinine, Ser: 0.88 mg/dL (ref 0.61–1.24)
GFR, Estimated: >60 mL/min (ref >60)
Glucose, Bld: 125 mg/dL — ABNORMAL HIGH (ref 70–99)
Potassium: 3.9 mmol/L (ref 3.5–5.1)
Sodium: 138 mmol/L (ref 135–145)
Total Bilirubin: 1.2 mg/dL (ref 0.3–1.2)
Total Protein: 6.8 g/dL (ref 6.5–8.1)

## 2020-12-19 MED ORDER — SODIUM CHLORIDE 0.9% FLUSH
10.0000 mL | Freq: Once | INTRAVENOUS | Status: AC
  Administered 2020-12-19: 10 mL via INTRAVENOUS
  Filled 2020-12-19: qty 10

## 2020-12-19 MED ORDER — DIPHENHYDRAMINE HCL 50 MG/ML IJ SOLN
50.0000 mg | Freq: Once | INTRAMUSCULAR | Status: AC
  Administered 2020-12-19: 50 mg via INTRAVENOUS
  Filled 2020-12-19: qty 1

## 2020-12-19 MED ORDER — PALONOSETRON HCL INJECTION 0.25 MG/5ML
0.2500 mg | Freq: Once | INTRAVENOUS | Status: AC
  Administered 2020-12-19: 0.25 mg via INTRAVENOUS
  Filled 2020-12-19: qty 5

## 2020-12-19 MED ORDER — TRASTUZUMAB-ANNS CHEMO 150 MG IV SOLR
4.0000 mg/kg | Freq: Once | INTRAVENOUS | Status: AC
  Administered 2020-12-19: 357 mg via INTRAVENOUS
  Filled 2020-12-19: qty 17

## 2020-12-19 MED ORDER — HEPARIN SOD (PORK) LOCK FLUSH 100 UNIT/ML IV SOLN
500.0000 [IU] | Freq: Once | INTRAVENOUS | Status: DC
  Filled 2020-12-19: qty 5

## 2020-12-19 MED ORDER — SODIUM CHLORIDE 0.9 % IV SOLN
5000.0000 mg | INTRAVENOUS | Status: DC
  Administered 2020-12-19: 5000 mg via INTRAVENOUS
  Filled 2020-12-19: qty 100

## 2020-12-19 MED ORDER — SODIUM CHLORIDE 0.9 % IV SOLN: 2.0000 mg/kg | Freq: Once | INTRAVENOUS | Status: DC

## 2020-12-19 MED ORDER — DEXTROSE 5 % IV SOLN
850.0000 mg | Freq: Once | INTRAVENOUS | Status: AC
  Administered 2020-12-19: 850 mg via INTRAVENOUS
  Filled 2020-12-19: qty 17.5

## 2020-12-19 MED ORDER — ACETAMINOPHEN 325 MG PO TABS
650.0000 mg | ORAL_TABLET | Freq: Once | ORAL | Status: AC
  Administered 2020-12-19: 650 mg via ORAL
  Filled 2020-12-19: qty 2

## 2020-12-19 MED ORDER — SODIUM CHLORIDE 0.9 % IV SOLN
400.0000 mg | Freq: Once | INTRAVENOUS | Status: AC
  Administered 2020-12-19: 400 mg via INTRAVENOUS
  Filled 2020-12-19: qty 16

## 2020-12-19 MED ORDER — FLUOROURACIL CHEMO INJECTION 2.5 GM/50ML
400.0000 mg/m2 | Freq: Once | INTRAVENOUS | Status: AC
  Administered 2020-12-19: 850 mg via INTRAVENOUS
  Filled 2020-12-19: qty 17

## 2020-12-19 MED ORDER — SODIUM CHLORIDE 0.9 % IV SOLN
Freq: Once | INTRAVENOUS | Status: AC
  Filled 2020-12-19: qty 250

## 2020-12-19 MED ORDER — SODIUM CHLORIDE 0.9 % IV SOLN
10.0000 mg | Freq: Once | INTRAVENOUS | Status: AC
  Administered 2020-12-19: 10 mg via INTRAVENOUS
  Filled 2020-12-19: qty 10

## 2020-12-19 NOTE — Progress Notes (Signed)
MD made aware of the red area that is directly on pt.'s port. Pt states that the red area on his port has been there for a while and does not cause any discomfort. No signs of infection or discoloration. Pt.'s port accessed with no complications. No new orders given at this time. Pt updated and all questions answered at this time.   Javeah Loeza [employer]

## 2020-12-19 NOTE — Patient Instructions (Signed)
[facility]   Discharge Instructions: Thank you for choosing [facility] to provide your oncology and hematology care.  If you have a lab appointment with the [facility], please go directly to the [facility] and check in at the registration area.  Wear comfortable clothing and clothing appropriate for easy access to any Portacath or PICC line.   We strive to give you quality time with your provider. You may need to reschedule your appointment if you arrive late (15 or more minutes).  Arriving late affects you and other patients whose appointments are after yours.  Also, if you miss three or more appointments without notifying the office, you may be dismissed from the clinic at the provider's discretion.      For prescription refill requests, have your pharmacy contact our office and allow 72 hours for refills to be completed.    Today you received the following chemotherapy and/or immunotherapy agents keytruda, trastuzumab-anns, leucovorin, adrucil   Fluorouracil, 5-FU injection What is this medication? FLUOROURACIL, 5-FU (flure oh YOOR a sil) is a chemotherapy drug. It slows the growth of cancer cells. This medicine is used to treat many types of cancer like breast cancer, colon or rectal cancer, pancreatic cancer, and stomachcancer. This medicine may be used for other purposes; ask your health care provider orpharmacist if you have questions. COMMON BRAND NAME(S): Adrucil What should I tell my care team before I take this medication? They need to know if you have any of these conditions: blood disorders dihydropyrimidine dehydrogenase (DPD) deficiency infection (especially a virus infection such as chickenpox, cold sores, or herpes) kidney disease liver disease malnourished, poor nutrition recent or ongoing radiation therapy an unusual or allergic reaction to fluorouracil, other chemotherapy, other medicines, foods, dyes, or  preservatives pregnant or trying to get pregnant breast-feeding How should I use this medication? This drug is given as an infusion or injection into a vein. It is administeredin a hospital or clinic by a specially trained health care professional. Talk to your pediatrician regarding the use of this medicine in children.Special care may be needed. Overdosage: If you think you have taken too much of this medicine contact apoison control center or emergency room at once. NOTE: This medicine is only for you. Do not share this medicine with others. What if I miss a dose? It is important not to miss your dose. Call your doctor or health careprofessional if you are unable to keep an appointment. What may interact with this medication? Do not take this medicine with any of the following medications: live virus vaccines This medicine may also interact with the following medications: medicines that treat or prevent blood clots like warfarin, enoxaparin, and dalteparin This list may not describe all possible interactions. Give your health care provider a list of all the medicines, herbs, non-prescription drugs, or dietary supplements you use. Also tell them if you smoke, drink alcohol, or use illegaldrugs. Some items may interact with your medicine. What should I watch for while using this medication? Visit your doctor for checks on your progress. This drug may make you feel generally unwell. This is not uncommon, as chemotherapy can affect healthy cells as well as cancer cells. Report any side effects. Continue your course oftreatment even though you feel ill unless your doctor tells you to stop. In some cases, you may be given additional medicines to help with side effects.Follow all directions for their use. Call your doctor or health care professional for  advice if you get a fever, chills or sore throat, or other symptoms of a cold or flu. Do not treat yourself. This drug decreases your body's ability  to fight infections. Try toavoid being around people who are sick. This medicine may increase your risk to bruise or bleed. Call your doctor orhealth care professional if you notice any unusual bleeding. Be careful brushing and flossing your teeth or using a toothpick because you may get an infection or bleed more easily. If you have any dental work done,tell your dentist you are receiving this medicine. Avoid taking products that contain aspirin, acetaminophen, ibuprofen, naproxen, or ketoprofen unless instructed by your doctor. These medicines may hide afever. Do not become pregnant while taking this medicine. Women should inform their doctor if they wish to become pregnant or think they might be pregnant. There is a potential for serious side effects to an unborn child. Talk to your health care professional or pharmacist for more information. Do not breast-feed aninfant while taking this medicine. Men should inform their doctor if they wish to father a child. This medicinemay lower sperm counts. Do not treat diarrhea with over the counter products. Contact your doctor ifyou have diarrhea that lasts more than 2 days or if it is severe and watery. This medicine can make you more sensitive to the sun. Keep out of the sun. If you cannot avoid being in the sun, wear protective clothing and use sunscreen.Do not use sun lamps or tanning beds/booths. What side effects may I notice from receiving this medication? Side effects that you should report to your doctor or health care professionalas soon as possible: allergic reactions like skin rash, itching or hives, swelling of the face, lips, or tongue low blood counts - this medicine may decrease the number of white blood cells, red blood cells and platelets. You may be at increased risk for infections and bleeding. signs of infection - fever or chills, cough, sore throat, pain or difficulty passing urine signs of decreased platelets or bleeding - bruising,  pinpoint red spots on the skin, black, tarry stools, blood in the urine signs of decreased red blood cells - unusually weak or tired, fainting spells, lightheadedness breathing problems changes in vision chest pain mouth sores nausea and vomiting pain, swelling, redness at site where injected pain, tingling, numbness in the hands or feet redness, swelling, or sores on hands or feet stomach pain unusual bleeding Side effects that usually do not require medical attention (report to yourdoctor or health care professional if they continue or are bothersome): changes in finger or toe nails diarrhea dry or itchy skin hair loss headache loss of appetite sensitivity of eyes to the light stomach upset unusually teary eyes This list may not describe all possible side effects. Call your doctor for medical advice about side effects. You may report side effects to FDA at1-800-FDA-1088. Where should I keep my medication? This drug is given in a hospital or clinic and will not be stored at home. NOTE: This sheet is a summary. It may not cover all possible information. If you have questions about this medicine, talk to your doctor, pharmacist, orhealth care provider.  2022 Elsevier/Gold Standard (2019-05-25 15:00:03) Trastuzumab; Hyaluronidase injection What is this medication? TRASTUZUMAB; HYALURONIDASE (tras TOO zoo mab / hye al ur ON i dase) is used to treat breast cancer and stomach cancer. Trastuzumab is a monoclonal antibody.Hyaluronidase is used to improve the effects of trastuzumab. This medicine may be used for other purposes; ask your health  care provider orpharmacist if you have questions. COMMON BRAND NAME(S): HERCEPTIN HYLECTA What should I tell my care team before I take this medication? They need to know if you have any of these conditions: heart disease heart failure lung or breathing disease, like asthma an unusual or allergic reaction to trastuzumab, or other medications,  foods, dyes, or preservatives pregnant or trying to get pregnant breast-feeding How should I use this medication? This medicine is for injection under the skin. It is given by a health careprofessional in a hospital or clinic setting. Talk to your pediatrician regarding the use of this medicine in children. Thismedicine is not approved for use in children. Overdosage: If you think you have taken too much of this medicine contact apoison control center or emergency room at once. NOTE: This medicine is only for you. Do not share this medicine with others. What if I miss a dose? It is important not to miss a dose. Call your doctor or health careprofessional if you are unable to keep an appointment. What may interact with this medication? This medicine may interact with the following medications: certain types of chemotherapy, such as daunorubicin, doxorubicin, epirubicin, and idarubicin This list may not describe all possible interactions. Give your health care provider a list of all the medicines, herbs, non-prescription drugs, or dietary supplements you use. Also tell them if you smoke, drink alcohol, or use illegaldrugs. Some items may interact with your medicine. What should I watch for while using this medication? Visit your doctor for checks on your progress. Report any side effects. Continue your course of treatment even though you feel ill unless your doctortells you to stop. Call your doctor or health care professional for advice if you get a fever, chills or sore throat, or other symptoms of a cold or flu. Do not treatyourself. Try to avoid being around people who are sick. You may experience fever, chills and shaking during your first infusion. These effects are usually mild and can be treated with other medicines. Report any side effects during the infusion to your health care professional. Fever andchills usually do not happen with later infusions. Do not become pregnant while taking this  medicine or for 7 months after stopping it. Women should inform their doctor if they wish to become pregnant or think they might be pregnant. Women of child-bearing potential will need to have a negative pregnancy test before starting this medicine. There is a potential for serious side effects to an unborn child. Talk to your health care professional or pharmacist for more information. Do not breast-feed an infantwhile taking this medicine or for 7 months after stopping it. What side effects may I notice from receiving this medication? Side effects that you should report to your doctor or health care professionalas soon as possible: allergic reactions like skin rash, itching or hives, swelling of the face, lips, or tongue breathing problems chest pain or palpitations cough fever general ill feeling or flu-like symptoms signs of worsening heart failure like breathing problems; swelling in your legs and feet Side effects that usually do not require medical attention (report these toyour doctor or health care professional if they continue or are bothersome): bone pain changes in taste diarrhea joint pain nausea/vomiting unusually weak or tired weight loss This list may not describe all possible side effects. Call your doctor for medical advice about side effects. You may report side effects to FDA at1-800-FDA-1088. Where should I keep my medication? This drug is given in a hospital or  clinic and will not be stored at home. NOTE: This sheet is a summary. It may not cover all possible information. If you have questions about this medicine, talk to your doctor, pharmacist, orhealth care provider.  2022 Elsevier/Gold Standard (2017-09-12 21:54:17) Pembrolizumab injection What is this medication? PEMBROLIZUMAB (pem broe liz ue mab) is a monoclonal antibody. It is used totreat certain types of cancer. This medicine may be used for other purposes; ask your health care provider orpharmacist if you  have questions. COMMON BRAND NAME(S): Keytruda What should I tell my care team before I take this medication? They need to know if you have any of these conditions: autoimmune diseases like Crohn's disease, ulcerative colitis, or lupus have had or planning to have an allogeneic stem cell transplant (uses someone else's stem cells) history of organ transplant history of chest radiation nervous system problems like myasthenia gravis or Guillain-Barre syndrome an unusual or allergic reaction to pembrolizumab, other medicines, foods, dyes, or preservatives pregnant or trying to get pregnant breast-feeding How should I use this medication? This medicine is for infusion into a vein. It is given by a health careprofessional in a hospital or clinic setting. A special MedGuide will be given to you before each treatment. Be sure to readthis information carefully each time. Talk to your pediatrician regarding the use of this medicine in children. While this drug may be prescribed for children as young as 6 months for selectedconditions, precautions do apply. Overdosage: If you think you have taken too much of this medicine contact apoison control center or emergency room at once. NOTE: This medicine is only for you. Do not share this medicine with others. What if I miss a dose? It is important not to miss your dose. Call your doctor or health careprofessional if you are unable to keep an appointment. What may interact with this medication? Interactions have not been studied. This list may not describe all possible interactions. Give your health care provider a list of all the medicines, herbs, non-prescription drugs, or dietary supplements you use. Also tell them if you smoke, drink alcohol, or use illegaldrugs. Some items may interact with your medicine. What should I watch for while using this medication? Your condition will be monitored carefully while you are receiving thismedicine. You may need  blood work done while you are taking this medicine. Do not become pregnant while taking this medicine or for 4 months after stopping it. Women should inform their doctor if they wish to become pregnant or think they might be pregnant. There is a potential for serious side effects to an unborn child. Talk to your health care professional or pharmacist for more information. Do not breast-feed an infant while taking this medicine orfor 4 months after the last dose. What side effects may I notice from receiving this medication? Side effects that you should report to your doctor or health care professionalas soon as possible: allergic reactions like skin rash, itching or hives, swelling of the face, lips, or tongue bloody or black, tarry breathing problems changes in vision chest pain chills confusion constipation cough diarrhea dizziness or feeling faint or lightheaded fast or irregular heartbeat fever flushing joint pain low blood counts - this medicine may decrease the number of white blood cells, red blood cells and platelets. You may be at increased risk for infections and bleeding. muscle pain muscle weakness pain, tingling, numbness in the hands or feet persistent headache redness, blistering, peeling or loosening of the skin, including inside the mouth signs  and symptoms of high blood sugar such as dizziness; dry mouth; dry skin; fruity breath; nausea; stomach pain; increased hunger or thirst; increased urination signs and symptoms of kidney injury like trouble passing urine or change in the amount of urine signs and symptoms of liver injury like dark urine, light-colored stools, loss of appetite, nausea, right upper belly pain, yellowing of the eyes or skin sweating swollen lymph nodes weight loss Side effects that usually do not require medical attention (report to yourdoctor or health care professional if they continue or are bothersome): decreased appetite hair  loss tiredness This list may not describe all possible side effects. Call your doctor for medical advice about side effects. You may report side effects to FDA at1-800-FDA-1088. Where should I keep my medication? This drug is given in a hospital or clinic and will not be stored at home. NOTE: This sheet is a summary. It may not cover all possible information. If you have questions about this medicine, talk to your doctor, pharmacist, orhealth care provider.  2022 Elsevier/Gold Standard (2019-05-26 21:44:53)       To help prevent nausea and vomiting after your treatment, we encourage you to take your nausea medication as directed.  BELOW ARE SYMPTOMS THAT SHOULD BE REPORTED IMMEDIATELY: *FEVER GREATER THAN 100.4 F (38 C) OR HIGHER *CHILLS OR SWEATING *NAUSEA AND VOMITING THAT IS NOT CONTROLLED WITH YOUR NAUSEA MEDICATION *UNUSUAL SHORTNESS OF BREATH *UNUSUAL BRUISING OR BLEEDING *URINARY PROBLEMS (pain or burning when urinating, or frequent urination) *BOWEL PROBLEMS (unusual diarrhea, constipation, pain near the anus) TENDERNESS IN MOUTH AND THROAT WITH OR WITHOUT PRESENCE OF ULCERS (sore throat, sores in mouth, or a toothache) UNUSUAL RASH, SWELLING OR PAIN  UNUSUAL VAGINAL DISCHARGE OR ITCHING   Items with * indicate a potential emergency and should be followed up as soon as possible or go to the Emergency Department if any problems should occur.  Please show the CHEMOTHERAPY ALERT CARD or IMMUNOTHERAPY ALERT CARD at check-in to the Emergency Department and triage nurse.  Should you have questions after your visit or need to cancel or reschedule your appointment, please contact [facility]  [phone] and follow the prompts.  Office hours are 8:00 a.m. to 4:30 p.m. Monday - Friday. Please note that voicemails left after 4:00 p.m. may not be returned until the following business day.  We are closed weekends and major holidays. You have access to a  nurse at all times for urgent questions. Please call the main number to the clinic [phone] and follow the prompts.  For any non-urgent questions, you may also contact your provider using MyChart. We now offer e-Visits for anyone 36 and older to request care online for non-urgent symptoms. For details visit [email or web address].   Also download the MyChart app! Go to the app store, search "MyChart", open the app, select [facility], and log in with your MyChart username and password.  Due to Covid, a mask is required upon entering the hospital/clinic. If you do not have a mask, one will be given to you upon arrival. For doctor visits, patients may have 1 support person aged 1 or older with them. For treatment visits, patients cannot have anyone with them due to current Covid guidelines and our immunocompromised population.

## 2020-12-19 NOTE — Progress Notes (Signed)
Pt has a sore on left lower leg from a piece of metal hitting him. He is putting an oil on it and atb cream to it. The spots on his arms are still there and sometimes itching. Neuropathy of hands and feet

## 2020-12-19 NOTE — Progress Notes (Signed)
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  He underwent EGD on 06/20/2020 By Dr. Vicente Males which showed a large nearly obstructing mass in the lower third of the esophagus 35 cm from incisors.  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  PD-l1 <1 %, TMB HIGH    Presently patient reports dysphagia but is able to eat cereal or soft food.  He has lost about 13 pounds in the last month itself.  Denies any significant pain.  He recently retired after many years of service and is otherwise independent of his ADLs and IADLs.     PET CT scan on 06/26/2020 showed circumferential distal esophageal mass with an SUV of 13.3 extending over 5.5 cm. Right lower paratracheal node is 0.6 cm with an SUV of 3.1. Hypermetabolic right gastric lymph nodes including 1.1 cm node with an SUV of 5.8. Numerous hypermetabolic lesions throughout the liver somewhat confluent around the periphery which were hypermetabolic between 8 and 9. Faintly hypermetabolic left adrenal gland   NGS testing showed high tumor mutational burden CPS score less than 1. CD9-NRG1 fusion. BRAF 581L, CCN E1 gain.  EMLA for fusion of BX [id number], FGF 23 gain, FGF 6 gain, MET R988C, T p53 S1 21F. ERBB2 gain but no other actionable mutations   Patient currently  on first-line FOLFOX/trastuzumab/Keytruda  Interval history-patient continues to have rash over his bilateral forearms which is no better or no worse.  He has used topical steroid cream in the past which has not helped him much.Does not have a rash in other areas.  Appetite is fair and he does not report any significant difficulty swallowing  ECOG PS- 1 Pain scale- 0   Review of systems- Review of Systems  Constitutional:  Positive for malaise/fatigue. Negative for chills, fever and weight loss.  HENT:  Negative for congestion, ear discharge and nosebleeds.   Eyes:  Negative for blurred vision.  Respiratory:  Negative for cough, hemoptysis, sputum production, shortness of breath and wheezing.   Cardiovascular:  Negative for chest pain, palpitations, orthopnea and claudication.  Gastrointestinal:  Negative for abdominal pain, blood in stool, constipation, diarrhea, heartburn, melena, nausea and vomiting.  Genitourinary:  Negative for dysuria, flank pain, frequency, hematuria and urgency.  Musculoskeletal:  Negative for back pain, joint pain and myalgias.  Skin:  Positive for rash.  Neurological:  Negative for dizziness, tingling, focal weakness, seizures, weakness and headaches.  Endo/Heme/Allergies:  Does not bruise/bleed easily.  Psychiatric/Behavioral:  Negative for depression and suicidal ideas. The patient does not have insomnia.     No Known Allergies   Past Medical History:  Diagnosis Date   Esophageal adenocarcinoma ([place]) 06/23/2020   Family history of brain cancer    Family history of colon cancer    Family history of melanoma    Family history of stomach cancer    GERD (gastroesophageal reflux disease)  History of kidney stones    Nephrolithiasis    [facility]   Pancreatitis, acute    Personal history of colonic polyps    Dr Nicolasa Ducking   Psoriasis      Past Surgical History:  Procedure Laterality Date   COLONOSCOPY     COLONOSCOPY WITH PROPOFOL N/A 03/31/2020    Procedure: COLONOSCOPY WITH PROPOFOL;  Surgeon: Jonathon Bellows, MD;  Location: [facility] ENDOSCOPY;  Service: Gastroenterology;  Laterality: N/A;   ERCP     ESOPHAGOGASTRODUODENOSCOPY (EGD) WITH PROPOFOL N/A 06/19/2020   Procedure: ESOPHAGOGASTRODUODENOSCOPY (EGD) WITH PROPOFOL;  Surgeon: Jonathon Bellows, MD;  Location: [facility] ENDOSCOPY;  Service: Gastroenterology;  Laterality: N/A;   groin surgery     as a child   PORTA CATH INSERTION N/A 06/28/2020   Procedure: PORTA CATH INSERTION;  Surgeon: Algernon Huxley, MD;  Location: [facility] CV LAB;  Service: Cardiovascular;  Laterality: N/A;   SHOULDER SURGERY      Social History   Socioeconomic History   Marital status: Married    Spouse name: Not on file   Number of children: 2   Years of education: Not on file   Highest education level: Not on file  Occupational History   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  Tobacco Use   Smoking status: Former    Pack years: 0.00    Types: Cigarettes    Quit date: 07/08/1988    Years since quitting: 32.4   Smokeless tobacco: Never  Vaping Use   Vaping Use: Never used  Substance and Sexual Activity   Alcohol use: No    Comment: recovering alcoholic for 13 years   Drug use: No   Sexual activity: Not Currently  Other Topics Concern   Not on file  Social History Narrative   ** Merged History Encounter **       Social Determinants of Health   Financial Resource Strain: Not on file  Food Insecurity: Not on file  Transportation Needs: Not on file  Physical Activity: Not on file  Stress: Not on file  Social Connections: Not on file  Intimate Partner Violence: Not on file    Family History  Problem Relation Age of Onset   Stomach cancer Mother    Diabetes Brother    Hypertension Brother    Hypertension Father    Prostate cancer Father    Colon cancer Sister    Cancer Other        either cervical or ovarian, d. 57s   Brain cancer Sister      Current Outpatient  Medications:    dexamethasone (DECADRON) 4 MG tablet, Take 2 tablets (8 mg total) by mouth daily. Start the day after chemotherapy for 2 days. Take with food., Disp: 30 tablet, Rfl: 1   lidocaine-prilocaine (EMLA) cream, Apply 1 application topically as needed. Apply small amount to port site at least 1 hour prior to it being accessed, cover with plastic wrap, Disp: 30 g, Rfl: 3   pantoprazole (PROTONIX) 40 MG tablet, TAKE 1 TABLET BY MOUTH TWICE A DAY BEFORE MEALS, Disp: 60 tablet, Rfl: 3   triamcinolone cream (KENALOG) 0.1 %, Apply 1 application topically 3 (three) times daily as needed., Disp: 300 each, Rfl: 1   LORazepam (ATIVAN) 0.5 MG tablet, Take 1 tablet (0.5 mg total) by mouth every 6 (six) hours as needed (Nausea or vomiting). (Patient not taking: No sig reported), Disp: 30 tablet, Rfl: 0   ondansetron (ZOFRAN) 8 MG tablet,  Take 1 tablet (8 mg total) by mouth 2 (two) times daily as needed for refractory nausea / vomiting. Start on day 3 after chemotherapy. (Patient not taking: No sig reported), Disp: 30 tablet, Rfl: 1   oxyCODONE (OXY IR/ROXICODONE) 5 MG immediate release tablet, Take 1 tablet (5 mg total) by mouth every 8 (eight) hours as needed for severe pain. (Patient not taking: No sig reported), Disp: 60 tablet, Rfl: 0   prochlorperazine (COMPAZINE) 10 MG tablet, Take 1 tablet (10 mg total) by mouth every 6 (six) hours as needed (Nausea or vomiting). (Patient not taking: No sig reported), Disp: 30 tablet, Rfl: 1 No current facility-administered medications for this visit.  Facility-Administered Medications Ordered in Other Visits:    fluorouracil (ADRUCIL) 5,000 mg in sodium chloride 0.9 % 150 mL chemo infusion, 5,000 mg, Intravenous, 1 day or 1 dose, [name], MD   fluorouracil (ADRUCIL) chemo injection 850 mg, 400 mg/m2 (Treatment Plan Recorded), Intravenous, Once, [name], MD   heparin lock flush 100 unit/mL, 500 Units, Intravenous, Once, [name], MD   leucovorin  850 mg in dextrose 5 % 250 mL infusion, 850 mg, Intravenous, Once, [name], MD   trastuzumab-anns [facility]) 357 mg in sodium chloride 0.9 % 250 mL chemo infusion, 4 mg/kg (Treatment Plan Recorded), Intravenous, Once, [name], MD, Last Rate: 534 mL/hr at 12/19/20 1121, 357 mg at 12/19/20 1121  Physical exam:  Vitals:   12/19/20 0921  BP: 136/70  Pulse: 71  Resp: 16  Temp: 98 F (36.7 C)  TempSrc: Oral  Weight: 191 lb 9.6 oz (86.9 kg)  Height: _0  (1.803 m)   Physical Exam Cardiovascular:     Rate and Rhythm: Normal rate and regular rhythm.  Pulmonary:     Effort: Pulmonary effort is normal.  Skin:    General: Skin is warm and dry.     Comments: Scattered areas of erythematous maculopapular rash noted over bilateral forearms  Neurological:     Mental Status: He is alert and oriented to person, place, and time.     CMP Latest Ref Rng & Units 12/19/2020  Glucose 70 - 99 mg/dL 125(H)  BUN 8 - 23 mg/dL 12  Creatinine 0.61 - 1.24 mg/dL 0.88  Sodium 135 - 145 mmol/L 138  Potassium 3.5 - 5.1 mmol/L 3.9  Chloride 98 - 111 mmol/L 104  CO2 22 - 32 mmol/L 25  Calcium 8.9 - 10.3 mg/dL 8.9  Total Protein 6.5 - 8.1 g/dL 6.8  Total Bilirubin 0.3 - 1.2 mg/dL 1.2  Alkaline Phos 38 - 126 U/L 148(H)  AST 15 - 41 U/L 32  ALT 0 - 44 U/L 29   CBC Latest Ref Rng & Units 12/19/2020  WBC 4.0 - 10.5 K/uL 5.5  Hemoglobin 13.0 - 17.0 g/dL 13.2  Hematocrit 39.0 - 52.0 % 39.4  Platelets 150 - 400 K/uL 107(L)    Assessment and plan- Patient is a 65 y.o. 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  He is here for on treatment assessment prior to cycle 13 of 5-FU trastuzumab Keytruda chemotherapy  Counts are okay to proceed with cycle 13 of 5-FU trastuzumab chemotherapy today.  He will also receive Keytruda today which she gets every 6 weeks.  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  Plan to repeat scans next month.  Chemo induced thrombocytopenia likely secondary to oxaliplatin.  Oxaliplatin will not be given at this time.  Continue to monitor  Skin rash: Clinically this appears to be a photosensitivity rash on exposed surfaces on his forearms although he does not have a rash over his bilateral legs.  He has topical steroid cream which she will use as needed.  Continue to monitor rash does not appear to be a typical drug-induced skin rash.   Visit Diagnosis 1. Esophageal adenocarcinoma ([place])   2. Encounter for antineoplastic chemotherapy   3. Encounter for antineoplastic immunotherapy   4. Photosensitivity dermatitis   5. Chemotherapy-induced thrombocytopenia      Dr. Randa Evens, MD, MPH [facility] at [facility] [phone] 12/19/2020 11:50 AM

## 2020-12-19 NOTE — Progress Notes (Signed)
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  Patient receiving.  Patient receiving fluorouracil, leucovorin, tanstuzumab-anns today, no oxaliplatin.    Met with patient today during infusion.  Patient reports that his appetite is good. Denies any trouble swallowing.  Son was able to order 2 cases of boost plus shakes for him (360 calorie).  He has been drinking 1 shake per day.  Reports yesterday was able to eat hamburger for breakfast and chicken and dumplings with 2 orders of carrots from [employer] last night for dinner.   Reports that he has been working more outside and needs to drink more fluids.   Medications: reviewed  Labs: reviewed  Anthropometrics:   Weight 191 lb 9.6 oz  194 lb on 5/31 197 lb on 5/3 230 lb Jan 2021    NUTRITION DIAGNOSIS: Inadequate oral intake continues with weight loss    INTERVENTION:  Reviewed importance of increasing shakes to 2-3 times per day in addition to eating solid foods (boost plus) for more nutrition to prevent weight from decreasing further.  Discussed adding more food at lunch time.     MONITORING, EVALUATION, GOAL: weight trends, intake   NEXT VISIT: Tuesday, June 28 during infusion  Cory [name], [place], [place] Registered Dietitian [phone] (mobile)

## 2020-12-20 LAB — CEA: CEA: 6.2 ng/mL — ABNORMAL HIGH (ref 0.0–4.7)

## 2020-12-21 ENCOUNTER — Inpatient Hospital Stay: Payer: Medicare Other

## 2020-12-21 VITALS — BP 151/77 | HR 75

## 2020-12-21 DIAGNOSIS — C159 Malignant neoplasm of esophagus, unspecified: Secondary | ICD-10-CM

## 2020-12-21 DIAGNOSIS — Z5112 Encounter for antineoplastic immunotherapy: Secondary | ICD-10-CM | POA: Diagnosis not present

## 2020-12-21 MED ORDER — PEGFILGRASTIM-CBQV 6 MG/0.6ML ~~LOC~~ SOSY
6.0000 mg | PREFILLED_SYRINGE | Freq: Once | SUBCUTANEOUS | Status: AC
Start: 2020-12-21 — End: 2020-12-21
  Administered 2020-12-21: 6 mg via SUBCUTANEOUS
  Filled 2020-12-21: qty 0.6

## 2020-12-21 MED ORDER — SODIUM CHLORIDE 0.9% FLUSH
10.0000 mL | INTRAVENOUS | Status: DC | PRN
  Administered 2020-12-21: 10 mL
  Filled 2020-12-21: qty 10

## 2020-12-21 MED ORDER — HEPARIN SOD (PORK) LOCK FLUSH 100 UNIT/ML IV SOLN
500.0000 [IU] | Freq: Once | INTRAVENOUS | Status: AC | PRN
  Administered 2020-12-21: 500 [IU]
  Filled 2020-12-21: qty 5

## 2020-12-21 MED ORDER — HEPARIN SOD (PORK) LOCK FLUSH 100 UNIT/ML IV SOLN
INTRAVENOUS | Status: AC
  Filled 2020-12-21: qty 5

## 2021-01-02 ENCOUNTER — Other Ambulatory Visit: Payer: Self-pay

## 2021-01-02 ENCOUNTER — Inpatient Hospital Stay: Payer: Medicare Other

## 2021-01-02 ENCOUNTER — Inpatient Hospital Stay (HOSPITAL_BASED_OUTPATIENT_CLINIC_OR_DEPARTMENT_OTHER): Payer: Medicare Other | Admitting: Oncology

## 2021-01-02 VITALS — BP 143/74 | HR 74 | Temp 97.2°F | Resp 17 | Wt 195.0 lb

## 2021-01-02 DIAGNOSIS — Z5111 Encounter for antineoplastic chemotherapy: Secondary | ICD-10-CM | POA: Diagnosis not present

## 2021-01-02 DIAGNOSIS — Z1329 Encounter for screening for other suspected endocrine disorder: Secondary | ICD-10-CM

## 2021-01-02 DIAGNOSIS — C159 Malignant neoplasm of esophagus, unspecified: Secondary | ICD-10-CM

## 2021-01-02 DIAGNOSIS — Z5112 Encounter for antineoplastic immunotherapy: Secondary | ICD-10-CM | POA: Diagnosis not present

## 2021-01-02 DIAGNOSIS — Z95828 Presence of other vascular implants and grafts: Secondary | ICD-10-CM

## 2021-01-02 LAB — CBC WITH DIFFERENTIAL/PLATELET
Abs Immature Granulocytes: 0.04 10*3/uL (ref 0.00–0.07)
Basophils Absolute: 0 10*3/uL (ref 0.0–0.1)
Basophils Relative: 1 %
Eosinophils Absolute: 0.4 10*3/uL (ref 0.0–0.5)
Eosinophils Relative: 5 %
HCT: 40.6 % (ref 39.0–52.0)
Hemoglobin: 13 g/dL (ref 13.0–17.0)
Immature Granulocytes: 1 %
Lymphocytes Relative: 12 %
Lymphs Abs: 0.9 10*3/uL (ref 0.7–4.0)
MCH: 30.8 pg (ref 26.0–34.0)
MCHC: 32 g/dL (ref 30.0–36.0)
MCV: 96.2 fL (ref 80.0–100.0)
Monocytes Absolute: 0.6 10*3/uL (ref 0.1–1.0)
Monocytes Relative: 8 %
Neutro Abs: 5.5 10*3/uL (ref 1.7–7.7)
Neutrophils Relative %: 73 %
Platelets: 120 10*3/uL — ABNORMAL LOW (ref 150–400)
RBC: 4.22 MIL/uL (ref 4.22–5.81)
RDW: 15.7 % — ABNORMAL HIGH (ref 11.5–15.5)
WBC: 7.4 10*3/uL (ref 4.0–10.5)
nRBC: 0 % (ref 0.0–0.2)

## 2021-01-02 LAB — COMPREHENSIVE METABOLIC PANEL
ALT: 24 U/L (ref 0–44)
AST: 30 U/L (ref 15–41)
Albumin: 3.8 g/dL (ref 3.5–5.0)
Alkaline Phosphatase: 151 U/L — ABNORMAL HIGH (ref 38–126)
Anion gap: 8 (ref 5–15)
BUN: 13 mg/dL (ref 8–23)
CO2: 25 mmol/L (ref 22–32)
Calcium: 8.9 mg/dL (ref 8.9–10.3)
Chloride: 103 mmol/L (ref 98–111)
Creatinine, Ser: 0.8 mg/dL (ref 0.61–1.24)
GFR, Estimated: >60 mL/min (ref >60)
Glucose, Bld: 158 mg/dL — ABNORMAL HIGH (ref 70–99)
Potassium: 3.9 mmol/L (ref 3.5–5.1)
Sodium: 136 mmol/L (ref 135–145)
Total Bilirubin: 1.1 mg/dL (ref 0.3–1.2)
Total Protein: 6.5 g/dL (ref 6.5–8.1)

## 2021-01-02 LAB — TSH: TSH: 2.033 u[IU]/mL (ref 0.350–4.500)

## 2021-01-02 MED ORDER — SODIUM CHLORIDE 0.9% FLUSH
10.0000 mL | Freq: Once | INTRAVENOUS | Status: AC
  Administered 2021-01-02: 10 mL via INTRAVENOUS
  Filled 2021-01-02: qty 10

## 2021-01-02 MED ORDER — ACETAMINOPHEN 325 MG PO TABS
650.0000 mg | ORAL_TABLET | Freq: Once | ORAL | Status: AC
  Administered 2021-01-02: 650 mg via ORAL
  Filled 2021-01-02: qty 2

## 2021-01-02 MED ORDER — SODIUM CHLORIDE 0.9 % IV SOLN
Freq: Once | INTRAVENOUS | Status: AC
  Filled 2021-01-02: qty 250

## 2021-01-02 MED ORDER — FLUOROURACIL CHEMO INJECTION 2.5 GM/50ML
400.0000 mg/m2 | Freq: Once | INTRAVENOUS | Status: AC
  Administered 2021-01-02: 850 mg via INTRAVENOUS
  Filled 2021-01-02: qty 17

## 2021-01-02 MED ORDER — SODIUM CHLORIDE 0.9 % IV SOLN
10.0000 mg | Freq: Once | INTRAVENOUS | Status: AC
  Administered 2021-01-02: 10 mg via INTRAVENOUS
  Filled 2021-01-02: qty 10

## 2021-01-02 MED ORDER — LEUCOVORIN CALCIUM INJECTION 350 MG
850.0000 mg | Freq: Once | INTRAVENOUS | Status: AC
  Administered 2021-01-02: 850 mg via INTRAVENOUS
  Filled 2021-01-02: qty 42.5

## 2021-01-02 MED ORDER — DIPHENHYDRAMINE HCL 50 MG/ML IJ SOLN
50.0000 mg | Freq: Once | INTRAMUSCULAR | Status: AC
  Administered 2021-01-02: 50 mg via INTRAVENOUS
  Filled 2021-01-02: qty 1

## 2021-01-02 MED ORDER — SODIUM CHLORIDE 0.9 % IV SOLN
5000.0000 mg | INTRAVENOUS | Status: DC
  Administered 2021-01-02: 5000 mg via INTRAVENOUS
  Filled 2021-01-02: qty 100

## 2021-01-02 MED ORDER — TRASTUZUMAB-ANNS CHEMO 150 MG IV SOLR
4.0000 mg/kg | Freq: Once | INTRAVENOUS | Status: AC
  Administered 2021-01-02: 357 mg via INTRAVENOUS
  Filled 2021-01-02: qty 17

## 2021-01-02 MED ORDER — PALONOSETRON HCL INJECTION 0.25 MG/5ML
0.2500 mg | Freq: Once | INTRAVENOUS | Status: AC
  Administered 2021-01-02: 0.25 mg via INTRAVENOUS
  Filled 2021-01-02: qty 5

## 2021-01-02 NOTE — Progress Notes (Signed)
Continues to have a rash on bilateral arms. Does complain of neuropathy in feet and hands. No other issues today.

## 2021-01-02 NOTE — Patient Instructions (Signed)
[facility] ONCOLOGY  Discharge Instructions: Thank you for choosing [facility] to provide your oncology and hematology care.  If you have a lab appointment with the [facility], please go directly to the [facility] and check in at the registration area.  Wear comfortable clothing and clothing appropriate for easy access to any Portacath or PICC line.   We strive to give you quality time with your provider. You may need to reschedule your appointment if you arrive late (15 or more minutes).  Arriving late affects you and other patients whose appointments are after yours.  Also, if you miss three or more appointments without notifying the office, you may be dismissed from the clinic at the provider's discretion.      For prescription refill requests, have your pharmacy contact our office and allow 72 hours for refills to be completed.    Today you received the following chemotherapy and/or immunotherapy agents Kanjinti, leucovorin, adrucil   To help prevent nausea and vomiting after your treatment, we encourage you to take your nausea medication as directed.  BELOW ARE SYMPTOMS THAT SHOULD BE REPORTED IMMEDIATELY: *FEVER GREATER THAN 100.4 F (38 C) OR HIGHER *CHILLS OR SWEATING *NAUSEA AND VOMITING THAT IS NOT CONTROLLED WITH YOUR NAUSEA MEDICATION *UNUSUAL SHORTNESS OF BREATH *UNUSUAL BRUISING OR BLEEDING *URINARY PROBLEMS (pain or burning when urinating, or frequent urination) *BOWEL PROBLEMS (unusual diarrhea, constipation, pain near the anus) TENDERNESS IN MOUTH AND THROAT WITH OR WITHOUT PRESENCE OF ULCERS (sore throat, sores in mouth, or a toothache) UNUSUAL RASH, SWELLING OR PAIN  UNUSUAL VAGINAL DISCHARGE OR ITCHING   Items with * indicate a potential emergency and should be followed up as soon as possible or go to the Emergency Department if any problems should occur.  Please show the CHEMOTHERAPY ALERT CARD or IMMUNOTHERAPY ALERT  CARD at check-in to the Emergency Department and triage nurse.  Should you have questions after your visit or need to cancel or reschedule your appointment, please contact [facility]  [phone] and follow the prompts.  Office hours are 8:00 a.m. to 4:30 p.m. Monday - Friday. Please note that voicemails left after 4:00 p.m. may not be returned until the following business day.  We are closed weekends and major holidays. You have access to a nurse at all times for urgent questions. Please call the main number to the clinic [phone] and follow the prompts.  For any non-urgent questions, you may also contact your provider using MyChart. We now offer e-Visits for anyone 84 and older to request care online for non-urgent symptoms. For details visit [email or web address].   Also download the MyChart app! Go to the app store, search "MyChart", open the app, select [facility], and log in with your MyChart username and password.  Due to Covid, a mask is required upon entering the hospital/clinic. If you do not have a mask, one will be given to you upon arrival. For doctor visits, patients may have 1 support person aged 98 or older with them. For treatment visits, patients cannot have anyone with them due to current Covid guidelines and our immunocompromised population.

## 2021-01-02 NOTE — Progress Notes (Signed)
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  He underwent EGD on 06/20/2020 By Dr. Vicente Males which showed a large nearly obstructing mass in the lower third of the esophagus 35 cm from incisors.  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  PD-l1 <1 %, TMB HIGH    Presently patient reports dysphagia but is able to eat cereal or soft food.  He has lost about 13 pounds in the last month itself.  Denies any significant pain.  He recently retired after many years of service and is otherwise independent of his ADLs and IADLs.     PET CT scan on 06/26/2020 showed circumferential distal esophageal mass with an SUV of 13.3 extending over 5.5 cm. Right lower paratracheal node is 0.6 cm with an SUV of 3.1. Hypermetabolic right gastric lymph nodes including 1.1 cm node with an SUV of 5.8. Numerous hypermetabolic lesions throughout the liver somewhat confluent around the periphery which were hypermetabolic between 8 and 9. Faintly hypermetabolic left adrenal gland   NGS testing showed high tumor mutational burden CPS score less than 1. CD9-NRG1 fusion. BRAF 581L, CCN E1 gain.  EMLA for fusion of BX [id number], FGF 23 gain, FGF 6 gain, MET R988C, T p53 S1 50F. ERBB2 gain but no other actionable mutations   Patient  currently on first-line FOLFOX/trastuzumab/Keytruda  Interval history-patient currently reports doing well and denies any specific complaints at this time.  He does have bilateral rash over his forearms which has remained stable overall.  He states that it does not itch and bother him significantly.  Denies any significant difficulty swallowing food unless he is eating foods like steak.  Appetite and weight have remained stable.  Denies any pain  ECOG PS- 1 Pain scale- 0  Review of systems- Review of Systems  Constitutional:  Negative for chills, fever and weight loss.  HENT:  Negative for congestion, ear discharge and nosebleeds.   Eyes:  Negative for blurred vision.  Respiratory:  Negative for cough, hemoptysis, sputum production, shortness of breath and wheezing.   Cardiovascular:  Negative for chest pain, palpitations, orthopnea and claudication.  Gastrointestinal:  Negative for abdominal pain, blood in stool, constipation, diarrhea, heartburn, melena, nausea and vomiting.  Genitourinary:  Negative for dysuria, flank pain, frequency, hematuria and urgency.  Musculoskeletal:  Negative for back pain, joint pain and myalgias.  Skin:  Negative for rash.  Neurological:  Negative for dizziness, tingling, focal weakness, seizures, weakness and headaches.  Endo/Heme/Allergies:  Does not bruise/bleed easily.  Psychiatric/Behavioral:  Negative for depression and suicidal ideas. The patient does not have insomnia.       No Known Allergies   Past Medical History:  Diagnosis Date   Esophageal adenocarcinoma ([place]) 06/23/2020   Family history of brain cancer    Family history of colon cancer    Family history of melanoma    Family  history of stomach cancer    GERD (gastroesophageal reflux disease)    History of kidney stones    Nephrolithiasis    [facility]   Pancreatitis, acute    Personal history of colonic polyps    Dr Nicolasa Ducking   Psoriasis      Past Surgical History:  Procedure  Laterality Date   COLONOSCOPY     COLONOSCOPY WITH PROPOFOL N/A 03/31/2020   Procedure: COLONOSCOPY WITH PROPOFOL;  Surgeon: Jonathon Bellows, MD;  Location[facility] ENDOSCOPY;  Service: Gastroenterology;  Laterality: N/A;   ERCP     ESOPHAGOGASTRODUODENOSCOPY (EGD) WITH PROPOFOL N/A 06/19/2020   Procedure: ESOPHAGOGASTRODUODENOSCOPY (EGD) WITH PROPOFOL;  Surgeon: Jonathon Bellows, MD;  Location: [facility] ENDOSCOPY;  Service: Gastroenterology;  Laterality: N/A;   groin surgery     as a child   PORTA CATH INSERTION N/A 06/28/2020   Procedure: PORTA CATH INSERTION;  Surgeon: Algernon Huxley, MD;  Location: [place] [facility] LAB;  Service: Cardiovascular;  Laterality: N/A;   SHOULDER SURGERY      Social History   Socioeconomic History   Marital status: Married    Spouse name: Not on file   Number of children: 2   Years of education: Not on file   Highest education level: Not on file  Occupational History   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  Tobacco Use   Smoking status: Former    Pack years: 0.00    Types: Cigarettes    Quit date: 07/08/1988    Years since quitting: 32.5   Smokeless tobacco: Never  Vaping Use   Vaping Use: Never used  Substance and Sexual Activity   Alcohol use: No    Comment: recovering alcoholic for 13 years   Drug use: No   Sexual activity: Not Currently  Other Topics Concern   Not on file  Social History Narrative   ** Merged History Encounter **       Social Determinants of Health   Financial Resource Strain: Not on file  Food Insecurity: Not on file  Transportation Needs: Not on file  Physical Activity: Not on file  Stress: Not on file  Social Connections: Not on file  Intimate Partner Violence: Not on file    Family History  Problem Relation Age of Onset   Stomach cancer Mother    Diabetes Brother    Hypertension Brother    Hypertension Father    Prostate cancer Father    Colon cancer Sister    Cancer Other        either  cervical or ovarian, d. 40s   Brain cancer Sister      Current Outpatient Medications:    dexamethasone (DECADRON) 4 MG tablet, Take 2 tablets (8 mg total) by mouth daily. Start the day after chemotherapy for 2 days. Take with food., Disp: 30 tablet, Rfl: 1   lidocaine-prilocaine (EMLA) cream, Apply 1 application topically as needed. Apply small amount to port site at least 1 hour prior to it being accessed, cover with plastic wrap, Disp: 30 g, Rfl: 3   pantoprazole (PROTONIX) 40 MG tablet, TAKE 1 TABLET BY MOUTH TWICE A DAY BEFORE MEALS, Disp: 60 tablet, Rfl: 3   triamcinolone cream (KENALOG) 0.1 %, Apply 1 application topically 3 (three) times daily as needed., Disp: 300 each, Rfl: 1   LORazepam (ATIVAN) 0.5 MG tablet, Take 1 tablet (0.5 mg total) by mouth every 6 (six) hours as needed (Nausea or vomiting). (Patient not taking: No  sig reported), Disp: 30 tablet, Rfl: 0   ondansetron (ZOFRAN) 8 MG tablet, Take 1 tablet (8 mg total) by mouth 2 (two) times daily as needed for refractory nausea / vomiting. Start on day 3 after chemotherapy. (Patient not taking: No sig reported), Disp: 30 tablet, Rfl: 1   oxyCODONE (OXY IR/ROXICODONE) 5 MG immediate release tablet, Take 1 tablet (5 mg total) by mouth every 8 (eight) hours as needed for severe pain. (Patient not taking: No sig reported), Disp: 60 tablet, Rfl: 0   prochlorperazine (COMPAZINE) 10 MG tablet, Take 1 tablet (10 mg total) by mouth every 6 (six) hours as needed (Nausea or vomiting). (Patient not taking: No sig reported), Disp: 30 tablet, Rfl: 1 No current facility-administered medications for this visit.  Facility-Administered Medications Ordered in Other Visits:    fluorouracil (ADRUCIL) 5,000 mg in sodium chloride 0.9 % 150 mL chemo infusion, 5,000 mg, Intravenous, 1 day or 1 dose, [name], MD, 5,000 mg at 01/02/21 1143  Physical exam:  Vitals:   01/02/21 0847  BP: (!) 143/74  Pulse: 74  Resp: 17  Temp: (!) 97.2 F (36.2 C)   TempSrc: Tympanic  SpO2: 100%  Weight: 195 lb (88.5 kg)   Physical Exam Constitutional:      General: He is not in acute distress. Cardiovascular:     Rate and Rhythm: Normal rate and regular rhythm.     Heart sounds: Normal heart sounds.  Pulmonary:     Effort: Pulmonary effort is normal.     Breath sounds: Normal breath sounds.  Abdominal:     General: Bowel sounds are normal.     Palpations: Abdomen is soft.  Musculoskeletal:     Comments: Scattered maculopapular erythematous skin rash noted over bilateral forearms  Skin:    General: Skin is warm and dry.  Neurological:     Mental Status: He is alert and oriented to person, place, and time.     CMP Latest Ref Rng & Units 01/02/2021  Glucose 70 - 99 mg/dL 158(H)  BUN 8 - 23 mg/dL 13  Creatinine 0.61 - 1.24 mg/dL 0.80  Sodium 135 - 145 mmol/L 136  Potassium 3.5 - 5.1 mmol/L 3.9  Chloride 98 - 111 mmol/L 103  CO2 22 - 32 mmol/L 25  Calcium 8.9 - 10.3 mg/dL 8.9  Total Protein 6.5 - 8.1 g/dL 6.5  Total Bilirubin 0.3 - 1.2 mg/dL 1.1  Alkaline Phos 38 - 126 U/L 151(H)  AST 15 - 41 U/L 30  ALT 0 - 44 U/L 24   CBC Latest Ref Rng & Units 01/02/2021  WBC 4.0 - 10.5 K/uL 7.4  Hemoglobin 13.0 - 17.0 g/dL 13.0  Hematocrit 39.0 - 52.0 % 40.6  Platelets 150 - 400 K/uL 120(L)      Assessment and plan- Patient is a 65 y.o. 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  He is here for on treatment assessment prior to cycle 14 of 5-FU trastuzumab chemotherapy  Counts okay to proceed with cycle 14 of 5-FU trastuzumab chemotherapy today.  He received Keytruda 3 weeks ago and will therefore receive next dose with cycle 17.  He has repeat scans due next week.  Thrombocytopenia: Likely secondary to chemotherapy and prior use of oxaliplatin.  Stable continue to monitor  Skin rash over bilateral forearms: Overall it has remained stable.  Patient does have a prior history of psoriasis which was  under control for a while.  Patient reports  that his rash somewhat resembles rash from psoriasis that he had in the past.  He is currently on topical steroids.  I would like him to see his dermatologist again for any topical remedies for his skin rash.  If rash worsens and there is a concern for autoimmune flare I will have a low threshold to stop Keytruda.  He has not had any other autoimmune side effects so far and TSH presently is within normal limits.   Visit Diagnosis 1. Encounter for antineoplastic chemotherapy   2. Esophageal adenocarcinoma ([place])   3. Thyroid disorder screening      Dr. Randa Evens, MD, MPH [facility] [phone] 01/02/2021 12:53 PM

## 2021-01-02 NOTE — Addendum Note (Signed)
Addended by: Oneida Arenas on: 01/02/2021 09:15 AM   Modules accepted: Orders

## 2021-01-02 NOTE — Progress Notes (Signed)
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  Patient receiving fluorouracil and kanjinti.    Met with patient during infusion.  Patient reports that appetite has been good.  Denies nutrition impact symptoms.  Reports sometimes will have trouble swallowing piece of steak but otherwise denies trouble swallowing.  Patient reports eats a good breakfast every morning (eggs, grits, sausage, milk, waffle). Lunch maybe soup or sandwich.  Dinner meat (pork chop, chicken, beef stew) and vegetables.  Last night chicken legs, green beans, corn.      Medications: reviewed  Labs: reviewed  Anthropometrics:   Weight 195 lb today  191 lb on 6/14 194 lb on 5/31 197 lb on 5/3 230 lb on Jan 2021    NUTRITION DIAGNOSIS: Inadequate oral intake continues   INTERVENTION:  Patient to continue to drink oral nutrition supplements for added calories and protein Continue high calories and protein foods for weight maintenance.      MONITORING, EVALUATION, GOAL: weight trends, intake   NEXT VISIT: Tuesday, July 12 during infusion  Cory [name], [place], [place] Registered Dietitian [phone] (mobile)

## 2021-01-03 LAB — CEA: CEA: 5.2 ng/mL — ABNORMAL HIGH (ref 0.0–4.7)

## 2021-01-04 ENCOUNTER — Inpatient Hospital Stay: Payer: Medicare Other

## 2021-01-04 ENCOUNTER — Other Ambulatory Visit: Payer: Self-pay

## 2021-01-04 VITALS — BP 152/70 | HR 70 | Temp 96.0°F | Resp 12

## 2021-01-04 DIAGNOSIS — Z5112 Encounter for antineoplastic immunotherapy: Secondary | ICD-10-CM | POA: Diagnosis not present

## 2021-01-04 DIAGNOSIS — C159 Malignant neoplasm of esophagus, unspecified: Secondary | ICD-10-CM

## 2021-01-04 MED ORDER — HEPARIN SOD (PORK) LOCK FLUSH 100 UNIT/ML IV SOLN
500.0000 [IU] | Freq: Once | INTRAVENOUS | Status: AC | PRN
  Administered 2021-01-04: 500 [IU]
  Filled 2021-01-04: qty 5

## 2021-01-04 MED ORDER — SODIUM CHLORIDE 0.9% FLUSH
10.0000 mL | INTRAVENOUS | Status: DC | PRN
  Administered 2021-01-04: 10 mL
  Filled 2021-01-04: qty 10

## 2021-01-04 MED ORDER — PEGFILGRASTIM-CBQV 6 MG/0.6ML ~~LOC~~ SOSY
6.0000 mg | PREFILLED_SYRINGE | Freq: Once | SUBCUTANEOUS | Status: AC
  Administered 2021-01-04: 6 mg via SUBCUTANEOUS
  Filled 2021-01-04: qty 0.6

## 2021-01-04 MED ORDER — HEPARIN SOD (PORK) LOCK FLUSH 100 UNIT/ML IV SOLN
INTRAVENOUS | Status: AC
  Filled 2021-01-04: qty 5

## 2021-01-04 NOTE — Patient Instructions (Signed)
[facility] CENTER [place] [facility]  Discharge Instructions: Thank you for choosing [facility] to provide your oncology and hematology care.  If you have a lab appointment with the Cancer Center, please go directly to the Cancer Center and check in at the registration area.  Wear comfortable clothing and clothing appropriate for easy access to any Portacath or PICC line.   We strive to give you quality time with your provider. You may need to reschedule your appointment if you arrive late (15 or more minutes).  Arriving late affects you and other patients whose appointments are after yours.  Also, if you miss three or more appointments without notifying the office, you may be dismissed from the clinic at the provider's discretion.      For prescription refill requests, have your pharmacy contact our office and allow 72 hours for refills to be completed.    Today you received udenyca    To help prevent nausea and vomiting after your treatment, we encourage you to take your nausea medication as directed.  BELOW ARE SYMPTOMS THAT SHOULD BE REPORTED IMMEDIATELY: *FEVER GREATER THAN 100.4 F (38 C) OR HIGHER *CHILLS OR SWEATING *NAUSEA AND VOMITING THAT IS NOT CONTROLLED WITH YOUR NAUSEA MEDICATION *UNUSUAL SHORTNESS OF BREATH *UNUSUAL BRUISING OR BLEEDING *URINARY PROBLEMS (pain or burning when urinating, or frequent urination) *BOWEL PROBLEMS (unusual diarrhea, constipation, pain near the anus) TENDERNESS IN MOUTH AND THROAT WITH OR WITHOUT PRESENCE OF ULCERS (sore throat, sores in mouth, or a toothache) UNUSUAL RASH, SWELLING OR PAIN  UNUSUAL VAGINAL DISCHARGE OR ITCHING   Items with * indicate a potential emergency and should be followed up as soon as possible or go to the Emergency Department if any problems should occur.  Please show the CHEMOTHERAPY ALERT CARD or IMMUNOTHERAPY ALERT CARD at check-in to the Emergency Department and triage nurse.  Should you  have questions after your visit or need to cancel or reschedule your appointment, please contact [facility] C[facility] [place] [facility]  [phone] and follow the prompts.  Office hours are 8:00 a.m. to 4:30 p.m. Monday - Friday. Please note that voicemails left after 4:00 p.m. may not be returned until the following business day.  We are closed weekends and major holidays. You have access to a nurse at all times for urgent questions. Please call the main number to the clinic [phone] and follow the prompts.  For any non-urgent questions, you may also contact your provider using MyChart. We now offer e-Visits for anyone 18 and older to request care online for non-urgent symptoms. For details visit [email or web address].   Also download the MyChart app! Go to the app store, search "MyChart", open the app, select [place], and log in with your MyChart username and password.  Due to Covid, a mask is required upon entering the hospital/clinic. If you do not have a mask, one will be given to you upon arrival. For doctor visits, patients may have 1 support person aged 18 or older with them. For treatment visits, patients cannot have anyone with them due to current Covid guidelines and our immunocompromised population.  

## 2021-01-09 ENCOUNTER — Ambulatory Visit
Admission: RE | Admit: 2021-01-09 | Discharge: 2021-01-09 | Disposition: A | Discharge status: Home / Self Care | Payer: Medicare Other | Source: Ambulatory Visit | Attending: Oncology | Admitting: Oncology

## 2021-01-09 ENCOUNTER — Other Ambulatory Visit: Payer: Self-pay

## 2021-01-09 DIAGNOSIS — Z5111 Encounter for antineoplastic chemotherapy: Secondary | ICD-10-CM | POA: Diagnosis present

## 2021-01-09 DIAGNOSIS — C159 Malignant neoplasm of esophagus, unspecified: Secondary | ICD-10-CM | POA: Insufficient documentation

## 2021-01-09 MED ORDER — IOHEXOL 300 MG/ML  SOLN
75.0000 mL | Freq: Once | INTRAMUSCULAR | Status: AC | PRN
  Administered 2021-01-09: 75 mL via INTRAVENOUS

## 2021-01-16 ENCOUNTER — Other Ambulatory Visit: Payer: Self-pay | Admitting: *Deleted

## 2021-01-16 ENCOUNTER — Inpatient Hospital Stay: Payer: Medicare Other

## 2021-01-16 ENCOUNTER — Encounter: Payer: Self-pay | Admitting: Oncology

## 2021-01-16 ENCOUNTER — Inpatient Hospital Stay (HOSPITAL_BASED_OUTPATIENT_CLINIC_OR_DEPARTMENT_OTHER): Payer: Medicare Other | Admitting: Oncology

## 2021-01-16 ENCOUNTER — Inpatient Hospital Stay: Payer: Medicare Other | Attending: Oncology

## 2021-01-16 ENCOUNTER — Other Ambulatory Visit: Payer: Self-pay

## 2021-01-16 VITALS — BP 135/65 | HR 69 | Temp 98.0°F | Resp 16 | Wt 197.1 lb

## 2021-01-16 DIAGNOSIS — Z8371 Family history of colonic polyps: Secondary | ICD-10-CM | POA: Insufficient documentation

## 2021-01-16 DIAGNOSIS — R1310 Dysphagia, unspecified: Secondary | ICD-10-CM | POA: Insufficient documentation

## 2021-01-16 DIAGNOSIS — Z800 Family history of malignant neoplasm of digestive organs: Secondary | ICD-10-CM | POA: Insufficient documentation

## 2021-01-16 DIAGNOSIS — K4090 Unilateral inguinal hernia, without obstruction or gangrene, not specified as recurrent: Secondary | ICD-10-CM | POA: Insufficient documentation

## 2021-01-16 DIAGNOSIS — C159 Malignant neoplasm of esophagus, unspecified: Secondary | ICD-10-CM | POA: Diagnosis not present

## 2021-01-16 DIAGNOSIS — Z5189 Encounter for other specified aftercare: Secondary | ICD-10-CM | POA: Insufficient documentation

## 2021-01-16 DIAGNOSIS — Z808 Family history of malignant neoplasm of other organs or systems: Secondary | ICD-10-CM | POA: Diagnosis not present

## 2021-01-16 DIAGNOSIS — Z8042 Family history of malignant neoplasm of prostate: Secondary | ICD-10-CM | POA: Insufficient documentation

## 2021-01-16 DIAGNOSIS — C160 Malignant neoplasm of cardia: Secondary | ICD-10-CM

## 2021-01-16 DIAGNOSIS — Z5112 Encounter for antineoplastic immunotherapy: Secondary | ICD-10-CM

## 2021-01-16 DIAGNOSIS — C787 Secondary malignant neoplasm of liver and intrahepatic bile duct: Secondary | ICD-10-CM | POA: Diagnosis not present

## 2021-01-16 DIAGNOSIS — K5730 Diverticulosis of large intestine without perforation or abscess without bleeding: Secondary | ICD-10-CM | POA: Insufficient documentation

## 2021-01-16 DIAGNOSIS — G620 Drug-induced polyneuropathy: Secondary | ICD-10-CM | POA: Insufficient documentation

## 2021-01-16 DIAGNOSIS — T451X5A Adverse effect of antineoplastic and immunosuppressive drugs, initial encounter: Secondary | ICD-10-CM

## 2021-01-16 DIAGNOSIS — Z8249 Family history of ischemic heart disease and other diseases of the circulatory system: Secondary | ICD-10-CM | POA: Insufficient documentation

## 2021-01-16 DIAGNOSIS — D6959 Other secondary thrombocytopenia: Secondary | ICD-10-CM

## 2021-01-16 DIAGNOSIS — Z79899 Other long term (current) drug therapy: Secondary | ICD-10-CM | POA: Diagnosis not present

## 2021-01-16 DIAGNOSIS — I700 Atherosclerosis of aorta: Secondary | ICD-10-CM | POA: Diagnosis not present

## 2021-01-16 DIAGNOSIS — N200 Calculus of kidney: Secondary | ICD-10-CM | POA: Diagnosis not present

## 2021-01-16 DIAGNOSIS — Z87891 Personal history of nicotine dependence: Secondary | ICD-10-CM | POA: Insufficient documentation

## 2021-01-16 DIAGNOSIS — C799 Secondary malignant neoplasm of unspecified site: Secondary | ICD-10-CM

## 2021-01-16 DIAGNOSIS — Z5111 Encounter for antineoplastic chemotherapy: Secondary | ICD-10-CM | POA: Insufficient documentation

## 2021-01-16 DIAGNOSIS — Z833 Family history of diabetes mellitus: Secondary | ICD-10-CM | POA: Insufficient documentation

## 2021-01-16 LAB — CBC WITH DIFFERENTIAL/PLATELET
Abs Immature Granulocytes: 0.02 10*3/uL (ref 0.00–0.07)
Basophils Absolute: 0 10*3/uL (ref 0.0–0.1)
Basophils Relative: 0 %
Eosinophils Absolute: 0.3 10*3/uL (ref 0.0–0.5)
Eosinophils Relative: 4 %
HCT: 38.2 % — ABNORMAL LOW (ref 39.0–52.0)
Hemoglobin: 12.4 g/dL — ABNORMAL LOW (ref 13.0–17.0)
Immature Granulocytes: 0 %
Lymphocytes Relative: 12 %
Lymphs Abs: 0.7 10*3/uL (ref 0.7–4.0)
MCH: 30.9 pg (ref 26.0–34.0)
MCHC: 32.5 g/dL (ref 30.0–36.0)
MCV: 95.3 fL (ref 80.0–100.0)
Monocytes Absolute: 0.5 10*3/uL (ref 0.1–1.0)
Monocytes Relative: 8 %
Neutro Abs: 4.5 10*3/uL (ref 1.7–7.7)
Neutrophils Relative %: 76 %
Platelets: 128 10*3/uL — ABNORMAL LOW (ref 150–400)
RBC: 4.01 MIL/uL — ABNORMAL LOW (ref 4.22–5.81)
RDW: 15.9 % — ABNORMAL HIGH (ref 11.5–15.5)
WBC: 6 10*3/uL (ref 4.0–10.5)
nRBC: 0 % (ref 0.0–0.2)

## 2021-01-16 LAB — COMPREHENSIVE METABOLIC PANEL
ALT: 25 U/L (ref 0–44)
AST: 27 U/L (ref 15–41)
Albumin: 3.9 g/dL (ref 3.5–5.0)
Alkaline Phosphatase: 142 U/L — ABNORMAL HIGH (ref 38–126)
Anion gap: 7 (ref 5–15)
BUN: 18 mg/dL (ref 8–23)
CO2: 25 mmol/L (ref 22–32)
Calcium: 8.9 mg/dL (ref 8.9–10.3)
Chloride: 105 mmol/L (ref 98–111)
Creatinine, Ser: 0.86 mg/dL (ref 0.61–1.24)
GFR, Estimated: >60 mL/min (ref >60)
Glucose, Bld: 117 mg/dL — ABNORMAL HIGH (ref 70–99)
Potassium: 3.7 mmol/L (ref 3.5–5.1)
Sodium: 137 mmol/L (ref 135–145)
Total Bilirubin: 1.3 mg/dL — ABNORMAL HIGH (ref 0.3–1.2)
Total Protein: 6.5 g/dL (ref 6.5–8.1)

## 2021-01-16 MED ORDER — DIPHENHYDRAMINE HCL 50 MG/ML IJ SOLN
50.0000 mg | Freq: Once | INTRAMUSCULAR | Status: AC
  Administered 2021-01-16: 50 mg via INTRAVENOUS
  Filled 2021-01-16: qty 1

## 2021-01-16 MED ORDER — TRASTUZUMAB-ANNS CHEMO 150 MG IV SOLR
4.0000 mg/kg | Freq: Once | INTRAVENOUS | Status: AC
  Administered 2021-01-16: 357 mg via INTRAVENOUS
  Filled 2021-01-16: qty 17

## 2021-01-16 MED ORDER — LEUCOVORIN CALCIUM INJECTION 350 MG
850.0000 mg | Freq: Once | INTRAVENOUS | Status: AC
  Administered 2021-01-16: 850 mg via INTRAVENOUS
  Filled 2021-01-16: qty 35

## 2021-01-16 MED ORDER — SODIUM CHLORIDE 0.9 % IV SOLN
5000.0000 mg | INTRAVENOUS | Status: DC
  Administered 2021-01-16: 5000 mg via INTRAVENOUS
  Filled 2021-01-16: qty 100

## 2021-01-16 MED ORDER — PALONOSETRON HCL INJECTION 0.25 MG/5ML
0.2500 mg | Freq: Once | INTRAVENOUS | Status: AC
Start: 2021-01-16 — End: 2021-01-16
  Administered 2021-01-16: 0.25 mg via INTRAVENOUS
  Filled 2021-01-16: qty 5

## 2021-01-16 MED ORDER — HEPARIN SOD (PORK) LOCK FLUSH 100 UNIT/ML IV SOLN
500.0000 [IU] | Freq: Once | INTRAVENOUS | Status: DC | PRN
  Filled 2021-01-16: qty 5

## 2021-01-16 MED ORDER — FLUOROURACIL CHEMO INJECTION 2.5 GM/50ML
400.0000 mg/m2 | Freq: Once | INTRAVENOUS | Status: AC
  Administered 2021-01-16: 850 mg via INTRAVENOUS
  Filled 2021-01-16: qty 17

## 2021-01-16 MED ORDER — SODIUM CHLORIDE 0.9 % IV SOLN
10.0000 mg | Freq: Once | INTRAVENOUS | Status: AC
  Administered 2021-01-16: 10 mg via INTRAVENOUS
  Filled 2021-01-16: qty 10

## 2021-01-16 MED ORDER — ACETAMINOPHEN 325 MG PO TABS
650.0000 mg | ORAL_TABLET | Freq: Once | ORAL | Status: AC
Start: 2021-01-16 — End: 2021-01-16
  Administered 2021-01-16: 650 mg via ORAL
  Filled 2021-01-16: qty 2

## 2021-01-16 MED ORDER — SODIUM CHLORIDE 0.9 % IV SOLN
Freq: Once | INTRAVENOUS | Status: AC
  Filled 2021-01-16: qty 250

## 2021-01-16 NOTE — Progress Notes (Signed)
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  He underwent EGD on 06/20/2020 By Dr. Vicente Males which showed a large nearly obstructing mass in the lower third of the esophagus 35 cm from incisors.  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  PD-l1 <1 %, TMB HIGH    Presently patient reports dysphagia but is able to eat cereal or soft food.  He has lost about 13 pounds in the last month itself.  Denies any significant pain.  He recently retired after many years of service and is otherwise independent of his ADLs and IADLs.     PET CT scan on 06/26/2020 showed circumferential distal esophageal mass with an SUV of 13.3 extending over 5.5 cm. Right lower paratracheal node is 0.6 cm with an SUV of 3.1. Hypermetabolic right gastric lymph nodes including 1.1 cm node with an SUV of 5.8. Numerous hypermetabolic lesions throughout the liver somewhat confluent around the periphery which were hypermetabolic between 8 and 9. Faintly hypermetabolic left adrenal gland   NGS testing showed high tumor mutational burden CPS score less than 1. CD9-NRG1 fusion. BRAF 581L, CCN E1 gain.  EMLA for fusion of BX [id number], FGF 23 gain, FGF 6 gain, MET R988C, T p53 S1 46F. ERBB2 gain but no other actionable mutations   Patient  currently on first-line FOLFOX/trastuzumab/Keytruda    Interval history-patient reports doing well overall and denies any specific complaints at this time.  He is able to swallow foods like chicken and steak without significant swallowing difficulty.  Denies any abdominal pain  ECOG PS- 1 Pain scale- 0   Review of systems- Review of Systems  Constitutional:  Negative for chills, fever, malaise/fatigue and weight loss.  HENT:  Negative for congestion, ear discharge and nosebleeds.   Eyes:  Negative for blurred vision.  Respiratory:  Negative for cough, hemoptysis, sputum production, shortness of breath and wheezing.   Cardiovascular:  Negative for chest pain, palpitations, orthopnea and claudication.  Gastrointestinal:  Negative for abdominal pain, blood in stool, constipation, diarrhea, heartburn, melena, nausea and vomiting.  Genitourinary:  Negative for dysuria, flank pain, frequency, hematuria and urgency.  Musculoskeletal:  Negative for back pain, joint pain and myalgias.  Skin:  Negative for rash.  Neurological:  Negative for dizziness, tingling, focal weakness, seizures, weakness and headaches.  Endo/Heme/Allergies:  Does not bruise/bleed easily.  Psychiatric/Behavioral:  Negative for depression and suicidal ideas. The patient does not have insomnia.      No Known Allergies   Past Medical History:  Diagnosis Date   Esophageal adenocarcinoma ([place]) 06/23/2020   Family history of brain cancer    Family history of colon cancer    Family history of melanoma    Family history of stomach cancer    GERD (gastroesophageal reflux disease)    History of kidney stones    Nephrolithiasis    [facility]   Pancreatitis,  acute    Personal history of colonic polyps    Dr Nicolasa Ducking   Psoriasis      Past Surgical History:  Procedure Laterality Date   COLONOSCOPY     COLONOSCOPY WITH PROPOFOL N/A 03/31/2020   Procedure: COLONOSCOPY WITH PROPOFOL;  Surgeon: Jonathon Bellows, MD;  Location: [facility]  ENDOSCOPY;  Service: Gastroenterology;  Laterality: N/A;   ERCP     ESOPHAGOGASTRODUODENOSCOPY (EGD) WITH PROPOFOL N/A 06/19/2020   Procedure: ESOPHAGOGASTRODUODENOSCOPY (EGD) WITH PROPOFOL;  Surgeon: Jonathon Bellows, MD;  Location: [facility] ENDOSCOPY;  Service: Gastroenterology;  Laterality: N/A;   groin surgery     as a child   PORTA CATH INSERTION N/A 06/28/2020   Procedure: PORTA CATH INSERTION;  Surgeon: Algernon Huxley, MD;  Location: [facility];  Service: Cardiovascular;  Laterality: N/A;   SHOULDER SURGERY      Social History   Socioeconomic History   Marital status: Married    Spouse name: Not on file   Number of children: 2   Years of education: Not on file   Highest education level: Not on file  Occupational History   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  Tobacco Use   Smoking status: Former    Pack years: 0.00    Types: Cigarettes    Quit date: 07/08/1988    Years since quitting: 32.5   Smokeless tobacco: Never  Vaping Use   Vaping Use: Never used  Substance and Sexual Activity   Alcohol use: No    Comment: recovering alcoholic for 13 years   Drug use: No   Sexual activity: Not Currently  Other Topics Concern   Not on file  Social History Narrative   ** Merged History Encounter **       Social Determinants of Health   Financial Resource Strain: Not on file  Food Insecurity: Not on file  Transportation Needs: Not on file  Physical Activity: Not on file  Stress: Not on file  Social Connections: Not on file  Intimate Partner Violence: Not on file    Family History  Problem Relation Age of Onset   Stomach cancer Mother    Diabetes Brother    Hypertension Brother    Hypertension Father    Prostate cancer Father    Colon cancer Sister    Cancer Other        either cervical or ovarian, d. 67s   Brain cancer Sister      Current Outpatient Medications:    dexamethasone (DECADRON) 4 MG tablet, Take 2 tablets (8 mg total) by  mouth daily. Start the day after chemotherapy for 2 days. Take with food., Disp: 30 tablet, Rfl: 1   lidocaine-prilocaine (EMLA) cream, Apply 1 application topically as needed. Apply small amount to port site at least 1 hour prior to it being accessed, cover with plastic wrap, Disp: 30 g, Rfl: 3   ondansetron (ZOFRAN) 8 MG tablet, Take 1 tablet (8 mg total) by mouth 2 (two) times daily as needed for refractory nausea / vomiting. Start on day 3 after chemotherapy., Disp: 30 tablet, Rfl: 1   pantoprazole (PROTONIX) 40 MG tablet, TAKE 1 TABLET BY MOUTH TWICE A DAY BEFORE MEALS, Disp: 60 tablet, Rfl: 3   triamcinolone cream (KENALOG) 0.1 %, Apply 1 application topically 3 (three) times daily as needed., Disp: 300 each, Rfl: 1   LORazepam (ATIVAN) 0.5 MG tablet, Take 1 tablet (0.5 mg total) by mouth every 6 (six) hours as  needed (Nausea or vomiting). (Patient not taking: No sig reported), Disp: 30 tablet, Rfl: 0   oxyCODONE (OXY IR/ROXICODONE) 5 MG immediate release tablet, Take 1 tablet (5 mg total) by mouth every 8 (eight) hours as needed for severe pain. (Patient not taking: No sig reported), Disp: 60 tablet, Rfl: 0   prochlorperazine (COMPAZINE) 10 MG tablet, Take 1 tablet (10 mg total) by mouth every 6 (six) hours as needed (Nausea or vomiting). (Patient not taking: No sig reported), Disp: 30 tablet, Rfl: 1 No current facility-administered medications for this visit.  Facility-Administered Medications Ordered in Other Visits:    fluorouracil (ADRUCIL) 5,000 mg in sodium chloride 0.9 % 150 mL chemo infusion, 5,000 mg, Intravenous, 1 day or 1 dose, [name], MD, 5,000 mg at 01/16/21 1220   heparin lock flush 100 unit/mL, 500 Units, Intracatheter, Once PRN, [name], MD   sodium chloride flush (NS) 0.9 % injection 10 mL, 10 mL, Intracatheter, PRN, [name], MD, 10 mL at 01/04/21 1149  Physical exam:  Vitals:   01/16/21 0922  BP: 135/65  Pulse: 69  Resp: 16  Temp: 98 F (36.7 C)   TempSrc: Oral  SpO2: 98%  Weight: 197 lb 2 oz (89.4 kg)   Physical Exam Constitutional:      General: He is not in acute distress. Cardiovascular:     Rate and Rhythm: Normal rate and regular rhythm.     Heart sounds: Normal heart sounds.  Pulmonary:     Effort: Pulmonary effort is normal.     Breath sounds: Normal breath sounds.  Skin:    General: Skin is warm and dry.  Neurological:     Mental Status: He is alert and oriented to person, place, and time.     CMP Latest Ref Rng & Units 01/16/2021  Glucose 70 - 99 mg/dL 117(H)  BUN 8 - 23 mg/dL 18  Creatinine 0.61 - 1.24 mg/dL 0.86  Sodium 135 - 145 mmol/L 137  Potassium 3.5 - 5.1 mmol/L 3.7  Chloride 98 - 111 mmol/L 105  CO2 22 - 32 mmol/L 25  Calcium 8.9 - 10.3 mg/dL 8.9  Total Protein 6.5 - 8.1 g/dL 6.5  Total Bilirubin 0.3 - 1.2 mg/dL 1.3(H)  Alkaline Phos 38 - 126 U/L 142(H)  AST 15 - 41 U/L 27  ALT 0 - 44 U/L 25   CBC Latest Ref Rng & Units 01/16/2021  WBC 4.0 - 10.5 K/uL 6.0  Hemoglobin 13.0 - 17.0 g/dL 12.4(L)  Hematocrit 39.0 - 52.0 % 38.2(L)  Platelets 150 - 400 K/uL 128(L)    No images are attached to the encounter.  CT CHEST ABDOMEN PELVIS W CONTRAST  Result Date: 01/09/2021 CLINICAL DATA:  Follow-up metastatic soft a GIA carcinoma. EXAM: CT CHEST, ABDOMEN, AND PELVIS WITH CONTRAST TECHNIQUE: Multidetector CT imaging of the chest, abdomen and pelvis was performed following the standard protocol during bolus administration of intravenous contrast. CONTRAST:  85m OMNIPAQUE IOHEXOL 300 MG/ML  SOLN COMPARISON:  Multiple priors including most recent CT October 11, 2020 FINDINGS: CT CHEST FINDINGS Cardiovascular: Right chest wall Port-A-Cath with tip in the SVC. Aortic atherosclerosis without aneurysmal dilation. No central pulmonary embolus. Normal size heart. No significant pericardial effusion/thickening. Mediastinum/Nodes: Similar mild distal esophageal wall thickening, for instance on image 50/2. No pathologically  enlarged mediastinal, hilar or axillary lymph nodes. No discrete suspicious thyroid nodule. No pathologically enlarged supraclavicular lymph nodes visualized. Lungs/Pleura: Nodular consolidation with adjacent ground-glass opacities and architectural distortion in the  paramedian right lower lobe, with the nodularity measuring up to 10 mm on image 136/6. No pleural effusion. No pneumothorax. Musculoskeletal: Multilevel degenerative changes spine. No aggressive lytic or blastic lesion of bone. CT ABDOMEN PELVIS FINDINGS Hepatobiliary: Decreased in size and conspicuity of the numerous small hypoenhancing hepatic metastases, indexed lesion in the medial segment of the left lobe is much harder to characterize blending in with background liver parenchyma now measuring 2.4 x 1.1 cm previously 3.2 x 2.5 cm. No new suspicious hepatic lesions. 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 Gallbladder is unremarkable. No biliary ductal dilation. Pancreas: Within normal limits. Spleen: Within normal limits. Adrenals/Urinary Tract: Bilateral adrenal glands are unremarkable. Nonobstructive punctate bilateral renal calculi. No hydronephrosis. No solid enhancing renal lesions. Urinary bladder is grossly unremarkable for degree of distension. Stomach/Bowel: Radiopaque enteric contrast traverses the distal small bowel. Stomach is grossly unremarkable. No pathologic dilation of small bowel. The appendix is not definitely visualized however there is no pericecal inflammation. Colonic diverticulosis without findings of acute diverticulitis. Vascular/Lymphatic: Aortic atherosclerosis without aneurysmal dilation. The portal, splenic and superior mesenteric veins are patent. Hepatic veins appear patent. No pathologically enlarged abdominal or pelvic lymph nodes. Reproductive: Prostate is unremarkable. Other: No abdominopelvic ascites. Small fat containing left  inguinal hernia. Musculoskeletal: Multilevel degenerative change of the spine with large Schmorl's nodes. Mild S-shaped curvature of the thoracolumbar spine. Degenerative changes bilateral hips and SI joints. No aggressive lytic or blastic lesion of bone identified. IMPRESSION: 1. Similar mild distal esophageal wall thickening with decreased in size and conspicuity of the numerous small hepatic metastases. No evidence of new or progressive disease in the abdomen or pelvis. 2. Confluent nodular consolidations with adjacent ground-glass opacities and architectural distortion in the paramedian right lower lobe, findings which nonspecific but favored to represent post treatment/inflammatory change . Attention on follow-up imaging suggested. 3. Nonobstructive punctate bilateral renal calculi. 4. Colonic diverticulosis without findings of acute diverticulitis. 5.  Aortic Atherosclerosis (ICD10-I70.0). Electronically Signed   By: Dahlia Bailiff MD   On: 01/09/2021 10:13     Assessment and plan- Patient is a 65 y.o. 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  He is here for on treatment assessment prior to cycle 15 of 5-FU trastuzumab chemotherapy  Counts okay to proceed with cycle 15 of 5-FU trastuzumab chemotherapy today.  I will see him back in 2 weeks for cycle 16 when he receives 5-FU trastuzumab and Keytruda.  I have reviewed CT chest abdomen and pelvis images independently and discussed findings with the patient.  There continues to be good treatment response and shrinkage in the size of his liver lesions consistent with treatment response.  No signs of progressive disease.  Plan is to continue 5-FU trastuzumab Keytruda until progression or toxicity.  Chemo induced thrombocytopenia: Continue to monitor oxaliplatin has now been withheld   Visit Diagnosis 1. Esophageal adenocarcinoma ([place])   2. Encounter for antineoplastic chemotherapy   3. Encounter for  monoclonal antibody treatment for malignancy   4. Chemotherapy-induced thrombocytopenia      Dr. Randa Evens, MD, MPH [facility] [phone] 01/16/2021 5:05 PM

## 2021-01-16 NOTE — Progress Notes (Signed)
Nutrition Follow-up:    Patient with stage IV adenocarcinoma of GE junction with liver mets.  Patient receiving fluorouracil and kanjinti.    Met with patient during infusion.  Patient reports appetite is good and continuing to drink boost shakes. Denies nutrition impact symptoms.      Medications: reviewed  Labs: reviewed  Anthropometrics:   Weight 197 lb 2 oz today.    195 lb on 6/28 191 lb on 6/14 194 lb on 5/31 197 lb on 5/3 230 lb on Jan 2021   NUTRITION DIAGNOSIS: Inadequate oral intake continues    INTERVENTION:  Continue high calorie, high protein foods and shakes to prevent weight loss    MONITORING, EVALUATION, GOAL: weight trends, intake   NEXT VISIT: to be scheduled with treatment in ~4-6 weeks  Kerly Rigsbee B. Zenia Resides, [place], [place] Registered Dietitian [phone] (mobile)

## 2021-01-16 NOTE — Patient Instructions (Signed)
[facility]  Discharge Instructions: Thank you for choosing [facility] to provide your oncology and hematology care.  If you have a lab appointment with the [facility], please go directly to the [facility] and check in at the registration area.  Wear comfortable clothing and clothing appropriate for easy access to any Portacath or PICC line.   We strive to give you quality time with your provider. You may need to reschedule your appointment if you arrive late (15 or more minutes).  Arriving late affects you and other patients whose appointments are after yours.  Also, if you miss three or more appointments without notifying the office, you may be dismissed from the clinic at the provider's discretion.      For prescription refill requests, have your pharmacy contact our office and allow 72 hours for refills to be completed.    Today you received the following chemotherapy and/or immunotherapy agents Kanjinti, Leucovorin, & 5FU      To help prevent nausea and vomiting after your treatment, we encourage you to take your nausea medication as directed.  BELOW ARE SYMPTOMS THAT SHOULD BE REPORTED IMMEDIATELY: *FEVER GREATER THAN 100.4 F (38 C) OR HIGHER *CHILLS OR SWEATING *NAUSEA AND VOMITING THAT IS NOT CONTROLLED WITH YOUR NAUSEA MEDICATION *UNUSUAL SHORTNESS OF BREATH *UNUSUAL BRUISING OR BLEEDING *URINARY PROBLEMS (pain or burning when urinating, or frequent urination) *BOWEL PROBLEMS (unusual diarrhea, constipation, pain near the anus) TENDERNESS IN MOUTH AND THROAT WITH OR WITHOUT PRESENCE OF ULCERS (sore throat, sores in mouth, or a toothache) UNUSUAL RASH, SWELLING OR PAIN  UNUSUAL VAGINAL DISCHARGE OR ITCHING   Items with * indicate a potential emergency and should be followed up as soon as possible or go to the Emergency Department if any problems should occur.  Please show the CHEMOTHERAPY ALERT CARD or IMMUNOTHERAPY ALERT  CARD at check-in to the Emergency Department and triage nurse.  Should you have questions after your visit or need to cancel or reschedule your appointment, please contact [facility]  [phone] and follow the prompts.  Office hours are 8:00 a.m. to 4:30 p.m. Monday - Friday. Please note that voicemails left after 4:00 p.m. may not be returned until the following business day.  We are closed weekends and major holidays. You have access to a nurse at all times for urgent questions. Please call the main number to the clinic [phone] and follow the prompts.  For any non-urgent questions, you may also contact your provider using MyChart. We now offer e-Visits for anyone 90 and older to request care online for non-urgent symptoms. For details visit [email or web address].   Also download the MyChart app! Go to the app store, search "MyChart", open the app, select [facility], and log in with your MyChart username and password.  Due to Covid, a mask is required upon entering the hospital/clinic. If you do not have a mask, one will be given to you upon arrival. For doctor visits, patients may have 1 support person aged 86 or older with them. For treatment visits, patients cannot have anyone with them due to current Covid guidelines and our immunocompromised population.

## 2021-01-17 LAB — CEA: CEA: 6.5 ng/mL — ABNORMAL HIGH (ref 0.0–4.7)

## 2021-01-18 ENCOUNTER — Inpatient Hospital Stay: Payer: Medicare Other

## 2021-01-18 ENCOUNTER — Other Ambulatory Visit: Payer: Self-pay

## 2021-01-18 VITALS — BP 147/72 | HR 73 | Temp 97.8°F | Resp 18

## 2021-01-18 DIAGNOSIS — Z5112 Encounter for antineoplastic immunotherapy: Secondary | ICD-10-CM | POA: Diagnosis not present

## 2021-01-18 DIAGNOSIS — C159 Malignant neoplasm of esophagus, unspecified: Secondary | ICD-10-CM

## 2021-01-18 MED ORDER — HEPARIN SOD (PORK) LOCK FLUSH 100 UNIT/ML IV SOLN
500.0000 [IU] | Freq: Once | INTRAVENOUS | Status: AC | PRN
  Administered 2021-01-18: 500 [IU]
  Filled 2021-01-18: qty 5

## 2021-01-18 MED ORDER — PEGFILGRASTIM-CBQV 6 MG/0.6ML ~~LOC~~ SOSY
6.0000 mg | PREFILLED_SYRINGE | Freq: Once | SUBCUTANEOUS | Status: AC
  Administered 2021-01-18: 6 mg via SUBCUTANEOUS
  Filled 2021-01-18: qty 0.6

## 2021-01-18 MED ORDER — SODIUM CHLORIDE 0.9% FLUSH
10.0000 mL | INTRAVENOUS | Status: DC | PRN
  Administered 2021-01-18: 10 mL
  Filled 2021-01-18: qty 10

## 2021-01-18 MED ORDER — HEPARIN SOD (PORK) LOCK FLUSH 100 UNIT/ML IV SOLN
INTRAVENOUS | Status: AC
  Filled 2021-01-18: qty 5

## 2021-01-30 ENCOUNTER — Inpatient Hospital Stay: Payer: Medicare Other

## 2021-01-30 ENCOUNTER — Inpatient Hospital Stay (HOSPITAL_BASED_OUTPATIENT_CLINIC_OR_DEPARTMENT_OTHER): Payer: Medicare Other | Admitting: Oncology

## 2021-01-30 ENCOUNTER — Other Ambulatory Visit: Payer: Self-pay | Admitting: *Deleted

## 2021-01-30 ENCOUNTER — Other Ambulatory Visit: Payer: Self-pay

## 2021-01-30 ENCOUNTER — Encounter: Payer: Self-pay | Admitting: Oncology

## 2021-01-30 VITALS — BP 141/69 | HR 81 | Temp 98.5°F | Resp 16 | Ht 71.0 in | Wt 196.7 lb

## 2021-01-30 DIAGNOSIS — Z5111 Encounter for antineoplastic chemotherapy: Secondary | ICD-10-CM

## 2021-01-30 DIAGNOSIS — C159 Malignant neoplasm of esophagus, unspecified: Secondary | ICD-10-CM

## 2021-01-30 DIAGNOSIS — Z5112 Encounter for antineoplastic immunotherapy: Secondary | ICD-10-CM | POA: Diagnosis not present

## 2021-01-30 DIAGNOSIS — C160 Malignant neoplasm of cardia: Secondary | ICD-10-CM

## 2021-01-30 DIAGNOSIS — C799 Secondary malignant neoplasm of unspecified site: Secondary | ICD-10-CM | POA: Diagnosis not present

## 2021-01-30 LAB — COMPREHENSIVE METABOLIC PANEL
ALT: 22 U/L (ref 0–44)
AST: 26 U/L (ref 15–41)
Albumin: 3.9 g/dL (ref 3.5–5.0)
Alkaline Phosphatase: 133 U/L — ABNORMAL HIGH (ref 38–126)
Anion gap: 7 (ref 5–15)
BUN: 16 mg/dL (ref 8–23)
CO2: 24 mmol/L (ref 22–32)
Calcium: 8.9 mg/dL (ref 8.9–10.3)
Chloride: 105 mmol/L (ref 98–111)
Creatinine, Ser: 0.83 mg/dL (ref 0.61–1.24)
GFR, Estimated: >60 mL/min (ref >60)
Glucose, Bld: 145 mg/dL — ABNORMAL HIGH (ref 70–99)
Potassium: 4.1 mmol/L (ref 3.5–5.1)
Sodium: 136 mmol/L (ref 135–145)
Total Bilirubin: 0.8 mg/dL (ref 0.3–1.2)
Total Protein: 6.6 g/dL (ref 6.5–8.1)

## 2021-01-30 LAB — CBC WITH DIFFERENTIAL/PLATELET
Abs Immature Granulocytes: 0.05 10*3/uL (ref 0.00–0.07)
Basophils Absolute: 0 10*3/uL (ref 0.0–0.1)
Basophils Relative: 1 %
Eosinophils Absolute: 0.3 10*3/uL (ref 0.0–0.5)
Eosinophils Relative: 4 %
HCT: 38.6 % — ABNORMAL LOW (ref 39.0–52.0)
Hemoglobin: 12.6 g/dL — ABNORMAL LOW (ref 13.0–17.0)
Immature Granulocytes: 1 %
Lymphocytes Relative: 12 %
Lymphs Abs: 0.8 10*3/uL (ref 0.7–4.0)
MCH: 31.3 pg (ref 26.0–34.0)
MCHC: 32.6 g/dL (ref 30.0–36.0)
MCV: 96 fL (ref 80.0–100.0)
Monocytes Absolute: 0.7 10*3/uL (ref 0.1–1.0)
Monocytes Relative: 9 %
Neutro Abs: 5.2 10*3/uL (ref 1.7–7.7)
Neutrophils Relative %: 73 %
Platelets: 137 10*3/uL — ABNORMAL LOW (ref 150–400)
RBC: 4.02 MIL/uL — ABNORMAL LOW (ref 4.22–5.81)
RDW: 15.1 % (ref 11.5–15.5)
WBC: 7.1 10*3/uL (ref 4.0–10.5)
nRBC: 0 % (ref 0.0–0.2)

## 2021-01-30 MED ORDER — HEPARIN SOD (PORK) LOCK FLUSH 100 UNIT/ML IV SOLN
500.0000 [IU] | Freq: Once | INTRAVENOUS | Status: DC
  Filled 2021-01-30: qty 5

## 2021-01-30 MED ORDER — PALONOSETRON HCL INJECTION 0.25 MG/5ML
0.2500 mg | Freq: Once | INTRAVENOUS | Status: AC
  Administered 2021-01-30: 0.25 mg via INTRAVENOUS
  Filled 2021-01-30: qty 5

## 2021-01-30 MED ORDER — FLUOROURACIL CHEMO INJECTION 5 GM/100ML
5000.0000 mg | INTRAVENOUS | Status: DC
  Administered 2021-01-30: 5000 mg via INTRAVENOUS
  Filled 2021-01-30: qty 100

## 2021-01-30 MED ORDER — FLUOROURACIL CHEMO INJECTION 2.5 GM/50ML
400.0000 mg/m2 | Freq: Once | INTRAVENOUS | Status: AC
  Administered 2021-01-30: 850 mg via INTRAVENOUS
  Filled 2021-01-30: qty 17

## 2021-01-30 MED ORDER — SODIUM CHLORIDE 0.9 % IV SOLN
400.0000 mg | Freq: Once | INTRAVENOUS | Status: AC
  Administered 2021-01-30: 400 mg via INTRAVENOUS
  Filled 2021-01-30: qty 16

## 2021-01-30 MED ORDER — ACETAMINOPHEN 325 MG PO TABS
650.0000 mg | ORAL_TABLET | Freq: Once | ORAL | Status: AC
  Administered 2021-01-30: 650 mg via ORAL
  Filled 2021-01-30: qty 2

## 2021-01-30 MED ORDER — SODIUM CHLORIDE 0.9 % IV SOLN
INTRAVENOUS | Status: DC
  Filled 2021-01-30: qty 250

## 2021-01-30 MED ORDER — SODIUM CHLORIDE 0.9% FLUSH
10.0000 mL | Freq: Once | INTRAVENOUS | Status: AC
  Administered 2021-01-30: 10 mL via INTRAVENOUS
  Filled 2021-01-30: qty 10

## 2021-01-30 MED ORDER — SODIUM CHLORIDE 0.9 % IV SOLN
10.0000 mg | Freq: Once | INTRAVENOUS | Status: AC
  Administered 2021-01-30: 10 mg via INTRAVENOUS
  Filled 2021-01-30: qty 10

## 2021-01-30 MED ORDER — LEUCOVORIN CALCIUM INJECTION 350 MG
850.0000 mg | Freq: Once | INTRAVENOUS | Status: AC
  Administered 2021-01-30: 850 mg via INTRAVENOUS
  Filled 2021-01-30: qty 25

## 2021-01-30 MED ORDER — DIPHENHYDRAMINE HCL 50 MG/ML IJ SOLN
50.0000 mg | Freq: Once | INTRAMUSCULAR | Status: AC
  Administered 2021-01-30: 50 mg via INTRAVENOUS
  Filled 2021-01-30: qty 1

## 2021-01-30 MED ORDER — TRASTUZUMAB-ANNS CHEMO 150 MG IV SOLR
4.0000 mg/kg | Freq: Once | INTRAVENOUS | Status: AC
  Administered 2021-01-30: 357 mg via INTRAVENOUS
  Filled 2021-01-30: qty 17

## 2021-01-30 MED ORDER — DEXAMETHASONE 4 MG PO TABS: 8.0000 mg | ORAL_TABLET | Freq: Every day | ORAL | 1 refills | Status: DC

## 2021-01-30 NOTE — Progress Notes (Signed)
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  He underwent EGD on 06/20/2020 By Dr. Vicente Males which showed a large nearly obstructing mass in the lower third of the esophagus 35 cm from incisors.  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  PD-l1 <1 %, TMB HIGH    Presently patient reports dysphagia but is able to eat cereal or soft food.  He has lost about 13 pounds in the last month itself.  Denies any significant pain.  He recently retired after many years of service and is otherwise independent of his ADLs and IADLs.     PET CT scan on 06/26/2020 showed circumferential distal esophageal mass with an SUV of 13.3 extending over 5.5 cm. Right lower paratracheal node is 0.6 cm with an SUV of 3.1. Hypermetabolic right gastric lymph nodes including 1.1 cm node with an SUV of 5.8. Numerous hypermetabolic lesions throughout the liver somewhat confluent around the periphery which were hypermetabolic between 8 and 9. Faintly hypermetabolic left adrenal gland   NGS testing showed high tumor mutational burden CPS score less than 1. CD9-NRG1 fusion. BRAF 581L, CCN E1 gain.  EMLA for fusion of BX [id number], FGF 23 gain, FGF 6 gain, MET R988C, T p53 S1 1F. ERBB2 gain but no other actionable mutations   Patient  currently on first-line FOLFOX/trastuzumab/Keytruda    Interval history-patient is tolerating his treatments well.  He does report some tingling numbness in his hands and feet but it has not been affecting his quality of life significantly.  He is still able to do his work with tools.  Has not had any falls.  Denies any difficulty swallowing  ECOG PS- 1 Pain scale- 0   Review of systems- Review of Systems  Constitutional:  Negative for chills, fever, malaise/fatigue and weight loss.  HENT:  Negative for congestion, ear discharge and nosebleeds.   Eyes:  Negative for blurred vision.  Respiratory:  Negative for cough, hemoptysis, sputum production, shortness of breath and wheezing.   Cardiovascular:  Negative for chest pain, palpitations, orthopnea and claudication.  Gastrointestinal:  Negative for abdominal pain, blood in stool, constipation, diarrhea, heartburn, melena, nausea and vomiting.  Genitourinary:  Negative for dysuria, flank pain, frequency, hematuria and urgency.  Musculoskeletal:  Negative for back pain, joint pain and myalgias.  Skin:  Negative for rash.  Neurological:  Positive for sensory change (Peripheral neuropathy). Negative for dizziness, tingling, focal weakness, seizures, weakness and headaches.  Endo/Heme/Allergies:  Does not bruise/bleed easily.  Psychiatric/Behavioral:  Negative for depression and suicidal ideas. The patient does not have insomnia.       No Known Allergies   Past Medical History:  Diagnosis Date   Esophageal adenocarcinoma ([place]) 06/23/2020   Family history of brain cancer    Family history of colon cancer    Family history of melanoma    Family history of stomach cancer  GERD (gastroesophageal reflux disease)    History of kidney stones    Nephrolithiasis    [facility]   Pancreatitis, acute    Personal history of colonic polyps    Dr Nicolasa Ducking   Psoriasis      Past Surgical History:  Procedure Laterality Date   COLONOSCOPY      COLONOSCOPY WITH PROPOFOL N/A 03/31/2020   Procedure: COLONOSCOPY WITH PROPOFOL;  Surgeon: Jonathon Bellows, MD;  Location: [facility] ENDOSCOPY;  Service: Gastroenterology;  Laterality: N/A;   ERCP     ESOPHAGOGASTRODUODENOSCOPY (EGD) WITH PROPOFOL N/A 06/19/2020   Procedure: ESOPHAGOGASTRODUODENOSCOPY (EGD) WITH PROPOFOL;  Surgeon: Jonathon Bellows, MD;  Location: [facility] ENDOSCOPY;  Service: Gastroenterology;  Laterality: N/A;   groin surgery     as a child   PORTA CATH INSERTION N/A 06/28/2020   Procedure: PORTA CATH INSERTION;  Surgeon: Algernon Huxley, MD;  Location: [facility] LAB;  Service: Cardiovascular;  Laterality: N/A;   SHOULDER SURGERY      Social History   Socioeconomic History   Marital status: Married    Spouse name: Not on file   Number of children: 2   Years of education: Not on file   Highest education level: Not on file  Occupational History   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  Tobacco Use   Smoking status: Former    Types: Cigarettes    Quit date: 07/08/1988    Years since quitting: 32.5   Smokeless tobacco: Never  Vaping Use   Vaping Use: Never used  Substance and Sexual Activity   Alcohol use: No    Comment: recovering alcoholic for 13 years   Drug use: No   Sexual activity: Not Currently  Other Topics Concern   Not on file  Social History Narrative   ** Merged History Encounter **       Social Determinants of Health   Financial Resource Strain: Not on file  Food Insecurity: Not on file  Transportation Needs: Not on file  Physical Activity: Not on file  Stress: Not on file  Social Connections: Not on file  Intimate Partner Violence: Not on file    Family History  Problem Relation Age of Onset   Stomach cancer Mother    Diabetes Brother    Hypertension Brother    Hypertension Father    Prostate cancer Father    Colon cancer Sister    Cancer Other        either cervical or ovarian, d. 69s   Brain cancer Sister       Current Outpatient Medications:    lidocaine-prilocaine (EMLA) cream, Apply 1 application topically as needed. Apply small amount to port site at least 1 hour prior to it being accessed, cover with plastic wrap, Disp: 30 g, Rfl: 3   pantoprazole (PROTONIX) 40 MG tablet, TAKE 1 TABLET BY MOUTH TWICE A DAY BEFORE MEALS, Disp: 60 tablet, Rfl: 3   triamcinolone cream (KENALOG) 0.1 %, Apply 1 application topically 3 (three) times daily as needed., Disp: 300 each, Rfl: 1   dexamethasone (DECADRON) 4 MG tablet, Take 2 tablets (8 mg total) by mouth daily. Start the day after chemotherapy for 2 days. Take with food., Disp: 30 tablet, Rfl: 1   LORazepam (ATIVAN) 0.5 MG tablet, Take 1 tablet (0.5 mg total) by mouth every 6 (six) hours as needed (Nausea or vomiting). (Patient not taking: No sig reported), Disp: 30 tablet, Rfl: 0   ondansetron (ZOFRAN) 8 MG  tablet, Take 1 tablet (8 mg total) by mouth 2 (two) times daily as needed for refractory nausea / vomiting. Start on day 3 after chemotherapy. (Patient not taking: Reported on 01/30/2021), Disp: 30 tablet, Rfl: 1   oxyCODONE (OXY IR/ROXICODONE) 5 MG immediate release tablet, Take 1 tablet (5 mg total) by mouth every 8 (eight) hours as needed for severe pain. (Patient not taking: No sig reported), Disp: 60 tablet, Rfl: 0   prochlorperazine (COMPAZINE) 10 MG tablet, Take 1 tablet (10 mg total) by mouth every 6 (six) hours as needed (Nausea or vomiting). (Patient not taking: No sig reported), Disp: 30 tablet, Rfl: 1 No current facility-administered medications for this visit.  Facility-Administered Medications Ordered in Other Visits:    0.9 %  sodium chloride infusion, , Intravenous, Continuous, Earlie Server, MD, Last Rate: 20 mL/hr at 01/30/21 1132, New Bag at 01/30/21 1132   fluorouracil (ADRUCIL) 5,000 mg in sodium chloride 0.9 % 150 mL chemo infusion, 5,000 mg, Intravenous, 1 day or 1 dose, [name], MD   fluorouracil (ADRUCIL) chemo injection 850 mg,  400 mg/m2 (Treatment Plan Recorded), Intravenous, Once, [name], MD   heparin lock flush 100 unit/mL, 500 Units, Intravenous, Once, [name], MD   leucovorin 850 mg in dextrose 5 % 250 mL infusion, 850 mg, Intravenous, Once, [name], MD, Last Rate: 585 mL/hr at 01/30/21 1337, 850 mg at 01/30/21 1337   sodium chloride flush (NS) 0.9 % injection 10 mL, 10 mL, Intracatheter, PRN, [name], MD, 10 mL at 01/04/21 1149  Physical exam:  Vitals:   01/30/21 1124  BP: (!) 141/69  Pulse: 81  Resp: 16  Temp: 98.5 F (36.9 C)  TempSrc: Tympanic  Weight: 196 lb 11.2 oz (89.2 kg)  Height: _0  (1.803 m)   Physical Exam Constitutional:      General: He is not in acute distress. Cardiovascular:     Rate and Rhythm: Normal rate and regular rhythm.     Heart sounds: Normal heart sounds.  Pulmonary:     Effort: Pulmonary effort is normal.     Breath sounds: Normal breath sounds.  Abdominal:     General: Bowel sounds are normal.     Palpations: Abdomen is soft.  Skin:    General: Skin is warm and dry.  Neurological:     Mental Status: He is alert and oriented to person, place, and time.     CMP Latest Ref Rng & Units 01/30/2021  Glucose 70 - 99 mg/dL 145(H)  BUN 8 - 23 mg/dL 16  Creatinine 0.61 - 1.24 mg/dL 0.83  Sodium 135 - 145 mmol/L 136  Potassium 3.5 - 5.1 mmol/L 4.1  Chloride 98 - 111 mmol/L 105  CO2 22 - 32 mmol/L 24  Calcium 8.9 - 10.3 mg/dL 8.9  Total Protein 6.5 - 8.1 g/dL 6.6  Total Bilirubin 0.3 - 1.2 mg/dL 0.8  Alkaline Phos 38 - 126 U/L 133(H)  AST 15 - 41 U/L 26  ALT 0 - 44 U/L 22   CBC Latest Ref Rng & Units 01/30/2021  WBC 4.0 - 10.5 K/uL 7.1  Hemoglobin 13.0 - 17.0 g/dL 12.6(L)  Hematocrit 39.0 - 52.0 % 38.6(L)  Platelets 150 - 400 K/uL 137(L)    No images are attached to the encounter.  CT CHEST ABDOMEN PELVIS W CONTRAST  Result Date: 01/09/2021 CLINICAL DATA:  Follow-up metastatic soft a GIA carcinoma. EXAM: CT CHEST, ABDOMEN, AND  PELVIS WITH CONTRAST TECHNIQUE:  Multidetector CT imaging of the chest, abdomen and pelvis was performed following the standard protocol during bolus administration of intravenous contrast. CONTRAST:  97m OMNIPAQUE IOHEXOL 300 MG/ML  SOLN COMPARISON:  Multiple priors including most recent CT October 11, 2020 FINDINGS: CT CHEST FINDINGS Cardiovascular: Right chest wall Port-A-Cath with tip in the SVC. Aortic atherosclerosis without aneurysmal dilation. No central pulmonary embolus. Normal size heart. No significant pericardial effusion/thickening. Mediastinum/Nodes: Similar mild distal esophageal wall thickening, for instance on image 50/2. No pathologically enlarged mediastinal, hilar or axillary lymph nodes. No discrete suspicious thyroid nodule. No pathologically enlarged supraclavicular lymph nodes visualized. Lungs/Pleura: Nodular consolidation with adjacent ground-glass opacities and architectural distortion in the paramedian right lower lobe, with the nodularity measuring up to 10 mm on image 136/6. No pleural effusion. No pneumothorax. Musculoskeletal: Multilevel degenerative changes spine. No aggressive lytic or blastic lesion of bone. CT ABDOMEN PELVIS FINDINGS Hepatobiliary: Decreased in size and conspicuity of the numerous small hypoenhancing hepatic metastases, indexed lesion in the medial segment of the left lobe is much harder to characterize blending in with background liver parenchyma now measuring 2.4 x 1.1 cm previously 3.2 x 2.5 cm. No new suspicious hepatic lesions. 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 Gallbladder is unremarkable. No biliary ductal dilation. Pancreas: Within normal limits. Spleen: Within normal limits. Adrenals/Urinary Tract: Bilateral adrenal glands are unremarkable. Nonobstructive punctate bilateral renal calculi. No hydronephrosis. No solid enhancing renal lesions. Urinary bladder is grossly  unremarkable for degree of distension. Stomach/Bowel: Radiopaque enteric contrast traverses the distal small bowel. Stomach is grossly unremarkable. No pathologic dilation of small bowel. The appendix is not definitely visualized however there is no pericecal inflammation. Colonic diverticulosis without findings of acute diverticulitis. Vascular/Lymphatic: Aortic atherosclerosis without aneurysmal dilation. The portal, splenic and superior mesenteric veins are patent. Hepatic veins appear patent. No pathologically enlarged abdominal or pelvic lymph nodes. Reproductive: Prostate is unremarkable. Other: No abdominopelvic ascites. Small fat containing left inguinal hernia. Musculoskeletal: Multilevel degenerative change of the spine with large Schmorl's nodes. Mild S-shaped curvature of the thoracolumbar spine. Degenerative changes bilateral hips and SI joints. No aggressive lytic or blastic lesion of bone identified. IMPRESSION: 1. Similar mild distal esophageal wall thickening with decreased in size and conspicuity of the numerous small hepatic metastases. No evidence of new or progressive disease in the abdomen or pelvis. 2. Confluent nodular consolidations with adjacent ground-glass opacities and architectural distortion in the paramedian right lower lobe, findings which nonspecific but favored to represent post treatment/inflammatory change . Attention on follow-up imaging suggested. 3. Nonobstructive punctate bilateral renal calculi. 4. Colonic diverticulosis without findings of acute diverticulitis. 5.  Aortic Atherosclerosis (ICD10-I70.0). Electronically Signed   By: JDahlia BailiffMD   On: 01/09/2021 10:13     Assessment and plan- Patient is a 65y.o. 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  He is here for on treatment assessment prior to cycle 16 of 5-FU trastuzumab Keytruda chemotherapy  Counts okay to proceed with cycle 16 of 5-FU trastuzumab and Keytruda  chemotherapy today.  I will see him in 2 weeks for 5-FU trastuzumab alone.  He will need an echocardiogram prior.  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  Symptoms are overall mild grade 1.  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 Esophageal adenocarcinoma ([place])   3. Encounter for monoclonal antibody treatment for malignancy   4. Encounter for antineoplastic chemotherapy   5. Encounter for antineoplastic immunotherapy      Dr. Randa Evens, MD, MPH [facility] [phone] 01/30/2021 1:41 PM

## 2021-01-30 NOTE — Patient Instructions (Signed)
[facility]  Discharge Instructions: Thank you for choosing [facility] to provide your oncology and hematology care.  If you have a lab appointment with the [facility], please go directly to the [facility] and check in at the registration area.  Wear comfortable clothing and clothing appropriate for easy access to any Portacath or PICC line.   We strive to give you quality time with your provider. You may need to reschedule your appointment if you arrive late (15 or more minutes).  Arriving late affects you and other patients whose appointments are after yours.  Also, if you miss three or more appointments without notifying the office, you may be dismissed from the clinic at the provider's discretion.      For prescription refill requests, have your pharmacy contact our office and allow 72 hours for refills to be completed.    Today you received the following chemotherapy and/or immunotherapy agents Keytruda, Kanjinti, Leucovorin, 5Fu      To help prevent nausea and vomiting after your treatment, we encourage you to take your nausea medication as directed.  BELOW ARE SYMPTOMS THAT SHOULD BE REPORTED IMMEDIATELY: *FEVER GREATER THAN 100.4 F (38 C) OR HIGHER *CHILLS OR SWEATING *NAUSEA AND VOMITING THAT IS NOT CONTROLLED WITH YOUR NAUSEA MEDICATION *UNUSUAL SHORTNESS OF BREATH *UNUSUAL BRUISING OR BLEEDING *URINARY PROBLEMS (pain or burning when urinating, or frequent urination) *BOWEL PROBLEMS (unusual diarrhea, constipation, pain near the anus) TENDERNESS IN MOUTH AND THROAT WITH OR WITHOUT PRESENCE OF ULCERS (sore throat, sores in mouth, or a toothache) UNUSUAL RASH, SWELLING OR PAIN  UNUSUAL VAGINAL DISCHARGE OR ITCHING   Items with * indicate a potential emergency and should be followed up as soon as possible or go to the Emergency Department if any problems should occur.  Please show the CHEMOTHERAPY ALERT CARD or  IMMUNOTHERAPY ALERT CARD at check-in to the Emergency Department and triage nurse.  Should you have questions after your visit or need to cancel or reschedule your appointment, please contact [facility]  [phone] and follow the prompts.  Office hours are 8:00 a.m. to 4:30 p.m. Monday - Friday. Please note that voicemails left after 4:00 p.m. may not be returned until the following business day.  We are closed weekends and major holidays. You have access to a nurse at all times for urgent questions. Please call the main number to the clinic [phone] and follow the prompts.  For any non-urgent questions, you may also contact your provider using MyChart. We now offer e-Visits for anyone 1 and older to request care online for non-urgent symptoms. For details visit [email or web address].   Also download the MyChart app! Go to the app store, search "MyChart", open the app, select [facility], and log in with your MyChart username and password.  Due to Covid, a mask is required upon entering the hospital/clinic. If you do not have a mask, one will be given to you upon arrival. For doctor visits, patients may have 1 support person aged 25 or older with them. For treatment visits, patients cannot have anyone with them due to current Covid guidelines and our immunocompromised population.   Pembrolizumab injection What is this medication? PEMBROLIZUMAB (pem broe liz ue mab) is a monoclonal antibody. It is used totreat certain types of cancer. This medicine may be used for other purposes; ask your health care provider orpharmacist if you have questions. COMMON BRAND NAME(S): Keytruda What should I tell my care  team before I take this medication? They need to know if you have any of these conditions: autoimmune diseases like Crohn's disease, ulcerative colitis, or lupus have had or planning to have an allogeneic stem cell transplant (uses someone else's stem  cells) history of organ transplant history of chest radiation nervous system problems like myasthenia gravis or Guillain-Barre syndrome an unusual or allergic reaction to pembrolizumab, other medicines, foods, dyes, or preservatives pregnant or trying to get pregnant breast-feeding How should I use this medication? This medicine is for infusion into a vein. It is given by a health careprofessional in a hospital or clinic setting. A special MedGuide will be given to you before each treatment. Be sure to readthis information carefully each time. Talk to your pediatrician regarding the use of this medicine in children. While this drug may be prescribed for children as young as 6 months for selectedconditions, precautions do apply. Overdosage: If you think you have taken too much of this medicine contact apoison control center or emergency room at once. NOTE: This medicine is only for you. Do not share this medicine with others. What if I miss a dose? It is important not to miss your dose. Call your doctor or health careprofessional if you are unable to keep an appointment. What may interact with this medication? Interactions have not been studied. This list may not describe all possible interactions. Give your health care provider a list of all the medicines, herbs, non-prescription drugs, or dietary supplements you use. Also tell them if you smoke, drink alcohol, or use illegaldrugs. Some items may interact with your medicine. What should I watch for while using this medication? Your condition will be monitored carefully while you are receiving thismedicine. You may need blood work done while you are taking this medicine. Do not become pregnant while taking this medicine or for 4 months after stopping it. Women should inform their doctor if they wish to become pregnant or think they might be pregnant. There is a potential for serious side effects to an unborn child. Talk to your health care  professional or pharmacist for more information. Do not breast-feed an infant while taking this medicine orfor 4 months after the last dose. What side effects may I notice from receiving this medication? Side effects that you should report to your doctor or health care professionalas soon as possible: allergic reactions like skin rash, itching or hives, swelling of the face, lips, or tongue bloody or black, tarry breathing problems changes in vision chest pain chills confusion constipation cough diarrhea dizziness or feeling faint or lightheaded fast or irregular heartbeat fever flushing joint pain low blood counts - this medicine may decrease the number of white blood cells, red blood cells and platelets. You may be at increased risk for infections and bleeding. muscle pain muscle weakness pain, tingling, numbness in the hands or feet persistent headache redness, blistering, peeling or loosening of the skin, including inside the mouth signs and symptoms of high blood sugar such as dizziness; dry mouth; dry skin; fruity breath; nausea; stomach pain; increased hunger or thirst; increased urination signs and symptoms of kidney injury like trouble passing urine or change in the amount of urine signs and symptoms of liver injury like dark urine, light-colored stools, loss of appetite, nausea, right upper belly pain, yellowing of the eyes or skin sweating swollen lymph nodes weight loss Side effects that usually do not require medical attention (report to yourdoctor or health care professional if they continue or are bothersome):  decreased appetite hair loss tiredness This list may not describe all possible side effects. Call your doctor for medical advice about side effects. You may report side effects to FDA at1-800-FDA-1088. Where should I keep my medication? This drug is given in a hospital or clinic and will not be stored at home. NOTE: This sheet is a summary. It may not cover  all possible information. If you have questions about this medicine, talk to your doctor, pharmacist, orhealth care provider.  2022 Elsevier/Gold Standard (2019-05-26 21:44:53)   Trastuzumab injection for infusion What is this medication? TRASTUZUMAB (tras TOO zoo mab) is a monoclonal antibody. It is used to treatbreast cancer and stomach cancer. This medicine may be used for other purposes; ask your health care provider orpharmacist if you have questions. COMMON BRAND NAME(S): Herceptin, Janae Bridgeman, Ontruzant, Trazimera What should I tell my care team before I take this medication? They need to know if you have any of these conditions: heart disease heart failure lung or breathing disease, like asthma an unusual or allergic reaction to trastuzumab, benzyl alcohol, or other medications, foods, dyes, or preservatives pregnant or trying to get pregnant breast-feeding How should I use this medication? This drug is given as an infusion into a vein. It is administered in a hospitalor clinic by a specially trained health care professional. Talk to your pediatrician regarding the use of this medicine in children. Thismedicine is not approved for use in children. Overdosage: If you think you have taken too much of this medicine contact apoison control center or emergency room at once. NOTE: This medicine is only for you. Do not share this medicine with others. What if I miss a dose? It is important not to miss a dose. Call your doctor or health careprofessional if you are unable to keep an appointment. What may interact with this medication? This medicine may interact with the following medications: certain types of chemotherapy, such as daunorubicin, doxorubicin, epirubicin, and idarubicin This list may not describe all possible interactions. Give your health care provider a list of all the medicines, herbs, non-prescription drugs, or dietary supplements you use. Also tell them if  you smoke, drink alcohol, or use illegaldrugs. Some items may interact with your medicine. What should I watch for while using this medication? Visit your doctor for checks on your progress. Report any side effects. Continue your course of treatment even though you feel ill unless your doctortells you to stop. Call your doctor or health care professional for advice if you get a fever, chills or sore throat, or other symptoms of a cold or flu. Do not treatyourself. Try to avoid being around people who are sick. You may experience fever, chills and shaking during your first infusion. These effects are usually mild and can be treated with other medicines. Report any side effects during the infusion to your health care professional. Fever andchills usually do not happen with later infusions. Do not become pregnant while taking this medicine or for 7 months after stopping it. Women should inform their doctor if they wish to become pregnant or think they might be pregnant. Women of child-bearing potential will need to have a negative pregnancy test before starting this medicine. There is a potential for serious side effects to an unborn child. Talk to your health care professional or pharmacist for more information. Do not breast-feed an infantwhile taking this medicine or for 7 months after stopping it. Women must use effective birth control with this medicine. What side effects  may I notice from receiving this medication? Side effects that you should report to your doctor or health care professionalas soon as possible: allergic reactions like skin rash, itching or hives, swelling of the face, lips, or tongue chest pain or palpitations cough dizziness feeling faint or lightheaded, falls fever general ill feeling or flu-like symptoms signs of worsening heart failure like breathing problems; swelling in your legs and feet unusually weak or tired Side effects that usually do not require medical attention  (report to yourdoctor or health care professional if they continue or are bothersome): bone pain changes in taste diarrhea joint pain nausea/vomiting weight loss This list may not describe all possible side effects. Call your doctor for medical advice about side effects. You may report side effects to FDA at1-800-FDA-1088. Where should I keep my medication? This drug is given in a hospital or clinic and will not be stored at home. NOTE: This sheet is a summary. It may not cover all possible information. If you have questions about this medicine, talk to your doctor, pharmacist, orhealth care provider.  2022 Elsevier/Gold Standard (2016-06-18 14:37:52)  Leucovorin injection What is this medication? LEUCOVORIN (loo koe VOR in) is used to prevent or treat the harmful effects of some medicines. This medicine is used to treat anemia caused by a low amount of folic acid in the body. It is also used with 5-fluorouracil (5-FU) to treatcolon cancer. This medicine may be used for other purposes; ask your health care provider orpharmacist if you have questions. What should I tell my care team before I take this medication? They need to know if you have any of these conditions: anemia from low levels of vitamin B-12 in the blood an unusual or allergic reaction to leucovorin, folic acid, other medicines, foods, dyes, or preservatives pregnant or trying to get pregnant breast-feeding How should I use this medication? This medicine is for injection into a muscle or into a vein. It is given by ahealth care professional in a hospital or clinic setting. Talk to your pediatrician regarding the use of this medicine in children.Special care may be needed. Overdosage: If you think you have taken too much of this medicine contact apoison control center or emergency room at once. NOTE: This medicine is only for you. Do not share this medicine with others. What if I miss a dose? This does not apply. What may  interact with this medication? capecitabine fluorouracil phenobarbital phenytoin primidone trimethoprim-sulfamethoxazole This list may not describe all possible interactions. Give your health care provider a list of all the medicines, herbs, non-prescription drugs, or dietary supplements you use. Also tell them if you smoke, drink alcohol, or use illegaldrugs. Some items may interact with your medicine. What should I watch for while using this medication? Your condition will be monitored carefully while you are receiving thismedicine. This medicine may increase the side effects of 5-fluorouracil, 5-FU. Tell your doctor or health care professional if you have diarrhea or mouth sores that donot get better or that get worse. What side effects may I notice from receiving this medication? Side effects that you should report to your doctor or health care professionalas soon as possible: allergic reactions like skin rash, itching or hives, swelling of the face, lips, or tongue breathing problems fever, infection mouth sores unusual bleeding or bruising unusually weak or tired Side effects that usually do not require medical attention (report to yourdoctor or health care professional if they continue or are bothersome): constipation or diarrhea loss of appetite  nausea, vomiting This list may not describe all possible side effects. Call your doctor for medical advice about side effects. You may report side effects to FDA at1-800-FDA-1088. Where should I keep my medication? This drug is given in a hospital or clinic and will not be stored at home. NOTE: This sheet is a summary. It may not cover all possible information. If you have questions about this medicine, talk to your doctor, pharmacist, orhealth care provider.  2022 Elsevier/Gold Standard (2007-12-29 16:50:29)   Fluorouracil, 5-FU injection What is this medication? FLUOROURACIL, 5-FU (flure oh YOOR a sil) is a chemotherapy drug. It  slows the growth of cancer cells. This medicine is used to treat many types of cancer like breast cancer, colon or rectal cancer, pancreatic cancer, and stomachcancer. This medicine may be used for other purposes; ask your health care provider orpharmacist if you have questions. COMMON BRAND NAME(S): Adrucil What should I tell my care team before I take this medication? They need to know if you have any of these conditions: blood disorders dihydropyrimidine dehydrogenase (DPD) deficiency infection (especially a virus infection such as chickenpox, cold sores, or herpes) kidney disease liver disease malnourished, poor nutrition recent or ongoing radiation therapy an unusual or allergic reaction to fluorouracil, other chemotherapy, other medicines, foods, dyes, or preservatives pregnant or trying to get pregnant breast-feeding How should I use this medication? This drug is given as an infusion or injection into a vein. It is administeredin a hospital or clinic by a specially trained health care professional. Talk to your pediatrician regarding the use of this medicine in children.Special care may be needed. Overdosage: If you think you have taken too much of this medicine contact apoison control center or emergency room at once. NOTE: This medicine is only for you. Do not share this medicine with others. What if I miss a dose? It is important not to miss your dose. Call your doctor or health careprofessional if you are unable to keep an appointment. What may interact with this medication? Do not take this medicine with any of the following medications: live virus vaccines This medicine may also interact with the following medications: medicines that treat or prevent blood clots like warfarin, enoxaparin, and dalteparin This list may not describe all possible interactions. Give your health care provider a list of all the medicines, herbs, non-prescription drugs, or dietary supplements you use.  Also tell them if you smoke, drink alcohol, or use illegaldrugs. Some items may interact with your medicine. What should I watch for while using this medication? Visit your doctor for checks on your progress. This drug may make you feel generally unwell. This is not uncommon, as chemotherapy can affect healthy cells as well as cancer cells. Report any side effects. Continue your course oftreatment even though you feel ill unless your doctor tells you to stop. In some cases, you may be given additional medicines to help with side effects.Follow all directions for their use. Call your doctor or health care professional for advice if you get a fever, chills or sore throat, or other symptoms of a cold or flu. Do not treat yourself. This drug decreases your body's ability to fight infections. Try toavoid being around people who are sick. This medicine may increase your risk to bruise or bleed. Call your doctor orhealth care professional if you notice any unusual bleeding. Be careful brushing and flossing your teeth or using a toothpick because you may get an infection or bleed more easily. If you have  any dental work Psychologist, sport and exercise you are receiving this medicine. Avoid taking products that contain aspirin, acetaminophen, ibuprofen, naproxen, or ketoprofen unless instructed by your doctor. These medicines may hide afever. Do not become pregnant while taking this medicine. Women should inform their doctor if they wish to become pregnant or think they might be pregnant. There is a potential for serious side effects to an unborn child. Talk to your health care professional or pharmacist for more information. Do not breast-feed aninfant while taking this medicine. Men should inform their doctor if they wish to father a child. This medicinemay lower sperm counts. Do not treat diarrhea with over the counter products. Contact your doctor ifyou have diarrhea that lasts more than 2 days or if it is severe and  watery. This medicine can make you more sensitive to the sun. Keep out of the sun. If you cannot avoid being in the sun, wear protective clothing and use sunscreen.Do not use sun lamps or tanning beds/booths. What side effects may I notice from receiving this medication? Side effects that you should report to your doctor or health care professionalas soon as possible: allergic reactions like skin rash, itching or hives, swelling of the face, lips, or tongue low blood counts - this medicine may decrease the number of white blood cells, red blood cells and platelets. You may be at increased risk for infections and bleeding. signs of infection - fever or chills, cough, sore throat, pain or difficulty passing urine signs of decreased platelets or bleeding - bruising, pinpoint red spots on the skin, black, tarry stools, blood in the urine signs of decreased red blood cells - unusually weak or tired, fainting spells, lightheadedness breathing problems changes in vision chest pain mouth sores nausea and vomiting pain, swelling, redness at site where injected pain, tingling, numbness in the hands or feet redness, swelling, or sores on hands or feet stomach pain unusual bleeding Side effects that usually do not require medical attention (report to yourdoctor or health care professional if they continue or are bothersome): changes in finger or toe nails diarrhea dry or itchy skin hair loss headache loss of appetite sensitivity of eyes to the light stomach upset unusually teary eyes This list may not describe all possible side effects. Call your doctor for medical advice about side effects. You may report side effects to FDA at1-800-FDA-1088. Where should I keep my medication? This drug is given in a hospital or clinic and will not be stored at home. NOTE: This sheet is a summary. It may not cover all possible information. If you have questions about this medicine, talk to your doctor,  pharmacist, orhealth care provider.  2022 Elsevier/Gold Standard (2019-05-25 15:00:03)

## 2021-01-30 NOTE — Progress Notes (Signed)
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

## 2021-01-30 NOTE — Patient Instructions (Signed)
[facility]  Discharge Instructions: Thank you for choosing [facility] to provide your oncology and hematology care.  If you have a lab appointment with the [facility], please go directly to the [facility] and check in at the registration area.  Wear comfortable clothing and clothing appropriate for easy access to any Portacath or PICC line.   We strive to give you quality time with your provider. You may need to reschedule your appointment if you arrive late (15 or more minutes).  Arriving late affects you and other patients whose appointments are after yours.  Also, if you miss three or more appointments without notifying the office, you may be dismissed from the clinic at the provider's discretion.      For prescription refill requests, have your pharmacy contact our office and allow 72 hours for refills to be completed.    Today you received the following chemotherapy and/or immunotherapy agents Keytruda, kanjinti, Leucovorin, 5 fu       To help prevent nausea and vomiting after your treatment, we encourage you to take your nausea medication as directed.  BELOW ARE SYMPTOMS THAT SHOULD BE REPORTED IMMEDIATELY: *FEVER GREATER THAN 100.4 F (38 C) OR HIGHER *CHILLS OR SWEATING *NAUSEA AND VOMITING THAT IS NOT CONTROLLED WITH YOUR NAUSEA MEDICATION *UNUSUAL SHORTNESS OF BREATH *UNUSUAL BRUISING OR BLEEDING *URINARY PROBLEMS (pain or burning when urinating, or frequent urination) *BOWEL PROBLEMS (unusual diarrhea, constipation, pain near the anus) TENDERNESS IN MOUTH AND THROAT WITH OR WITHOUT PRESENCE OF ULCERS (sore throat, sores in mouth, or a toothache) UNUSUAL RASH, SWELLING OR PAIN  UNUSUAL VAGINAL DISCHARGE OR ITCHING   Items with * indicate a potential emergency and should be followed up as soon as possible or go to the Emergency Department if any problems should occur.  Please show the CHEMOTHERAPY ALERT CARD or  IMMUNOTHERAPY ALERT CARD at check-in to the Emergency Department and triage nurse.  Should you have questions after your visit or need to cancel or reschedule your appointment, please contact [facility]  [phone] and follow the prompts.  Office hours are 8:00 a.m. to 4:30 p.m. Monday - Friday. Please note that voicemails left after 4:00 p.m. may not be returned until the following business day.  We are closed weekends and major holidays. You have access to a nurse at all times for urgent questions. Please call the main number to the clinic [phone] and follow the prompts.  For any non-urgent questions, you may also contact your provider using MyChart. We now offer e-Visits for anyone 80 and older to request care online for non-urgent symptoms. For details visit [email or web address].   Also download the MyChart app! Go to the app store, search "MyChart", open the app, select [facility], and log in with your MyChart username and password.  Due to Covid, a mask is required upon entering the hospital/clinic. If you do not have a mask, one will be given to you upon arrival. For doctor visits, patients may have 1 support person aged 51 or older with them. For treatment visits, patients cannot have anyone with them due to current Covid guidelines and our immunocompromised population.

## 2021-02-01 ENCOUNTER — Inpatient Hospital Stay: Payer: Medicare Other

## 2021-02-01 ENCOUNTER — Other Ambulatory Visit: Payer: Self-pay

## 2021-02-01 VITALS — BP 152/70 | HR 74 | Resp 16

## 2021-02-01 DIAGNOSIS — C159 Malignant neoplasm of esophagus, unspecified: Secondary | ICD-10-CM

## 2021-02-01 DIAGNOSIS — Z5112 Encounter for antineoplastic immunotherapy: Secondary | ICD-10-CM | POA: Diagnosis not present

## 2021-02-01 MED ORDER — HEPARIN SOD (PORK) LOCK FLUSH 100 UNIT/ML IV SOLN
500.0000 [IU] | Freq: Once | INTRAVENOUS | Status: AC | PRN
  Administered 2021-02-01: 500 [IU]
  Filled 2021-02-01: qty 5

## 2021-02-01 MED ORDER — PEGFILGRASTIM-CBQV 6 MG/0.6ML ~~LOC~~ SOSY
6.0000 mg | PREFILLED_SYRINGE | Freq: Once | SUBCUTANEOUS | Status: AC
  Administered 2021-02-01: 6 mg via SUBCUTANEOUS
  Filled 2021-02-01: qty 0.6

## 2021-02-01 MED ORDER — SODIUM CHLORIDE 0.9% FLUSH
10.0000 mL | INTRAVENOUS | Status: DC | PRN
  Administered 2021-02-01: 10 mL
  Filled 2021-02-01: qty 10

## 2021-02-06 ENCOUNTER — Ambulatory Visit (INDEPENDENT_AMBULATORY_CARE_PROVIDER_SITE_OTHER): Payer: Medicare Other | Admitting: Internal Medicine

## 2021-02-06 ENCOUNTER — Encounter: Payer: Self-pay | Admitting: Internal Medicine

## 2021-02-06 VITALS — BP 122/82 | HR 84 | Temp 98.0°F | Ht 69.75 in | Wt 188.0 lb

## 2021-02-06 DIAGNOSIS — K2100 Gastro-esophageal reflux disease with esophagitis, without bleeding: Secondary | ICD-10-CM | POA: Diagnosis not present

## 2021-02-06 DIAGNOSIS — C159 Malignant neoplasm of esophagus, unspecified: Secondary | ICD-10-CM

## 2021-02-06 DIAGNOSIS — M79675 Pain in left toe(s): Secondary | ICD-10-CM | POA: Diagnosis not present

## 2021-02-06 DIAGNOSIS — Z0000 Encounter for general adult medical examination without abnormal findings: Secondary | ICD-10-CM | POA: Diagnosis not present

## 2021-02-06 DIAGNOSIS — Z23 Encounter for immunization: Secondary | ICD-10-CM | POA: Diagnosis not present

## 2021-02-06 DIAGNOSIS — Z7189 Other specified counseling: Secondary | ICD-10-CM | POA: Diagnosis not present

## 2021-02-06 MED ORDER — CEPHALEXIN 500 MG PO CAPS: 500.0000 mg | ORAL_CAPSULE | Freq: Three times a day (TID) | ORAL | 1 refills | Status: DC

## 2021-02-06 NOTE — Assessment & Plan Note (Signed)
I have personally reviewed the Medicare Annual Wellness questionnaire and have noted  1. The patient's medical and social history  2. Their use of alcohol, tobacco or illicit drugs  3. Their current medications and supplements  4. The patient's functional ability including ADL's, fall risks, home safety risks and hearing or visual              impairment.  5. Diet and physical activities  6. Evidence for depression or mood disorders  The patients weight, height, BMI and visual acuity have been recorded in the chart  I have made referrals, counseling and provided education to the patient based review of the above and I have provided the pt with a written personalized care plan for preventive services.   I have provided you with a copy of your personalized plan for preventive services. Please take the time to review along with your updated medication list.  Initial EKG---sinus at 88. PACs. Possible LVH by voltage. No ischemic changes. Normal axis. No comparison  Recent colon and normal PSA last year Prevnar 20 today Needs COVID booster Discussed exercise

## 2021-02-06 NOTE — Assessment & Plan Note (Signed)
Quiet and swallowing better now Continues on protonix

## 2021-02-06 NOTE — Progress Notes (Signed)
Hearing Screening  Method: Audiometry   '500Hz'$  '1000Hz'$  '2000Hz'$  '4000Hz'$   Right ear '20 20 20 '$ 0  Left ear '20 20 20 '$ 0   Vision Screening   Right eye Left eye Both eyes  Without correction     With correction 20/15 20/20 20/15

## 2021-02-06 NOTE — Progress Notes (Signed)
Subjective:    Patient ID: Cory [name], male    DOB: 1955-12-02, 65 y.o.   MRN: [id number]  HPI Here for Welcome to Medicare visit and follow up of chronic health conditions This visit occurred during the SARS-CoV-2 public health emergency.  Safety protocols were in place, including screening questions prior to the visit, additional usage of staff PPE, and extensive cleaning of exam room while observing appropriate contact time as indicated for disinfecting solutions.   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 Start the day after chemotherapy for 2 days. Take with food. 30 tablet 1   lidocaine-prilocaine (EMLA) cream Apply 1 application topically as needed. Apply small amount to port site at least 1 hour prior to it being accessed, cover with plastic wrap 30 g 3   LORazepam (ATIVAN) 0.5 MG tablet Take 1 tablet (0.5 mg total) by mouth every 6 (six) hours as needed (Nausea or vomiting). 30 tablet 0   ondansetron (ZOFRAN) 8 MG tablet Take 1 tablet (8 mg total) by mouth 2 (two) times daily as needed for refractory nausea / vomiting. Start on day 3 after chemotherapy. 30 tablet 1   oxyCODONE (OXY IR/ROXICODONE) 5 MG immediate release tablet Take 1 tablet (5 mg  total) by mouth every 8 (eight) hours as needed for severe pain. 60 tablet 0   pantoprazole (PROTONIX) 40 MG tablet TAKE 1 TABLET BY MOUTH TWICE A DAY BEFORE MEALS 60 tablet 3   prochlorperazine (COMPAZINE) 10 MG tablet Take 1 tablet (10 mg total) by mouth every 6 (six) hours as needed (Nausea or vomiting). 30 tablet 1   triamcinolone cream (KENALOG) 0.1 % Apply 1 application topically 3 (three) times daily as needed. 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   Family history of brain cancer    Family history of colon cancer    Family history of melanoma    Family history of stomach cancer    GERD (gastroesophageal reflux disease)    History of kidney stones    Nephrolithiasis    [facility]   Pancreatitis, acute    Personal history of colonic polyps    Dr Nicolasa Ducking   Psoriasis     Past Surgical History:  Procedure Laterality Date   COLONOSCOPY     COLONOSCOPY WITH PROPOFOL N/A 03/31/2020   Procedure: COLONOSCOPY WITH PROPOFOL;  Surgeon: Jonathon Bellows, MD;  Location: [facility] ENDOSCOPY;  Service: Gastroenterology;  Laterality: N/A;  ERCP     ESOPHAGOGASTRODUODENOSCOPY (EGD) WITH PROPOFOL N/A 06/19/2020   Procedure: ESOPHAGOGASTRODUODENOSCOPY (EGD) WITH PROPOFOL;  Surgeon: Jonathon Bellows, MD;  Location: [facility] ENDOSCOPY;  Service: Gastroenterology;  Laterality: N/A;   groin surgery     as a child   PORTA CATH INSERTION N/A 06/28/2020   Procedure: PORTA CATH INSERTION;  Surgeon: Algernon Huxley, MD;  Location: [facility] LAB;  Service: Cardiovascular;  Laterality: N/A;   SHOULDER SURGERY      Family History  Problem Relation Age of Onset   Stomach cancer Mother     Diabetes Brother    Hypertension Brother    Hypertension Father    Prostate cancer Father    Colon cancer Sister    Cancer Other        either cervical or ovarian, d. 26s   Brain cancer Sister     Social History   Socioeconomic History   Marital status: Married    Spouse name: Not on file   Number of children: 2   Years of education: Not on file   Highest education level: Not on file  Occupational History   Occupation: Filtration and duct work    Comment: [employer]  Tobacco Use   Smoking status: Former    Types: Cigarettes    Quit date: 07/08/1988    Years since quitting: 32.6   Smokeless tobacco: Never  Vaping Use   Vaping Use: Never used  Substance and Sexual Activity   Alcohol use: No    Comment: recovering alcoholic for 13 years   Drug use: No   Sexual activity: Not Currently  Other Topics Concern   Not on file  Social History Narrative   Has living will--needs to get notarized   Daughter should be health care POA (wife with dementia)   Would accept resuscitation   Would accept tube feeds depending on the circumstance   Social Determinants of Health   Financial Resource Strain: Not on file  Food Insecurity: Not on file  Transportation Needs: Not on file  Physical Activity: Not on file  Stress: Not on file  Social Connections: Not on file  Intimate Partner Violence: Not on file   Review of Systems Appetite is fair Sleep is difficult for 2 days on the chemo--other than that he does well Wears seat belt Full dentures--no problems No skin lesions---does have an appt with dermatologist (past red spots--TAC helped eventually) No chest pain or SOB Some dizziness 2 days ago (and had low grade fever). May be related to overdoing it in the yard the day before No sig back or joint pains Bowels move fine--no blood Voids okay---slight dribbling if delayed. Nocturia frequent (2-4)     Objective:   Physical Exam Constitutional:      Appearance: Normal  appearance.  HENT:     Mouth/Throat:     Comments: No lesions Eyes:     Conjunctiva/sclera: Conjunctivae normal.     Pupils: Pupils are equal, round, and reactive to light.  Cardiovascular:     Rate and Rhythm: Normal rate and regular rhythm.     Pulses: Normal pulses.     Heart sounds: No murmur heard.   No gallop.  Pulmonary:     Effort: Pulmonary effort is normal.     Breath sounds: Normal breath sounds. No wheezing or rales.     Comments: Right subclavian port a cath Abdominal:     Palpations: Abdomen is soft.     Tenderness: There is  no abdominal tenderness.  Musculoskeletal:     Cervical back: Neck supple.     Right lower leg: No edema.     Left lower leg: No edema.     Comments: No inflammation/tenderness left 1st MTP  Lymphadenopathy:     Cervical: No cervical adenopathy.  Skin:    General: Skin is warm.     Comments: Skin break with scab on plantar left great toe---some redness along medial border (no sig tenderness)  Neurological:     Mental Status: He is alert and oriented to person, place, and time.     Comments: President---"Biden, Trump, Obama" 100-97? Not good at spelling (only did 8th grade) Recall 2/3  Psychiatric:        Mood and Affect: Mood normal.        Behavior: Behavior normal.           Assessment & Plan:

## 2021-02-06 NOTE — Assessment & Plan Note (Signed)
See social history 

## 2021-02-06 NOTE — Assessment & Plan Note (Signed)
Continues on chemotherapy

## 2021-02-06 NOTE — Addendum Note (Signed)
Addended by: Pilar Grammes on: 02/06/2021 09:24 AM   Modules accepted: Orders

## 2021-02-06 NOTE — Assessment & Plan Note (Signed)
Not gout ?early cellulitis Will try cephalexin

## 2021-02-13 ENCOUNTER — Encounter: Payer: Self-pay | Admitting: Oncology

## 2021-02-13 ENCOUNTER — Inpatient Hospital Stay: Payer: Medicare Other | Attending: Oncology

## 2021-02-13 ENCOUNTER — Inpatient Hospital Stay (HOSPITAL_BASED_OUTPATIENT_CLINIC_OR_DEPARTMENT_OTHER): Payer: Medicare Other | Admitting: Oncology

## 2021-02-13 ENCOUNTER — Inpatient Hospital Stay: Payer: Medicare Other

## 2021-02-13 VITALS — BP 134/57 | HR 72 | Temp 97.5°F | Wt 194.2 lb

## 2021-02-13 DIAGNOSIS — Z833 Family history of diabetes mellitus: Secondary | ICD-10-CM | POA: Diagnosis not present

## 2021-02-13 DIAGNOSIS — D649 Anemia, unspecified: Secondary | ICD-10-CM | POA: Insufficient documentation

## 2021-02-13 DIAGNOSIS — C159 Malignant neoplasm of esophagus, unspecified: Secondary | ICD-10-CM

## 2021-02-13 DIAGNOSIS — D6481 Anemia due to antineoplastic chemotherapy: Secondary | ICD-10-CM

## 2021-02-13 DIAGNOSIS — C787 Secondary malignant neoplasm of liver and intrahepatic bile duct: Secondary | ICD-10-CM | POA: Insufficient documentation

## 2021-02-13 DIAGNOSIS — C160 Malignant neoplasm of cardia: Secondary | ICD-10-CM | POA: Diagnosis present

## 2021-02-13 DIAGNOSIS — Z5112 Encounter for antineoplastic immunotherapy: Secondary | ICD-10-CM | POA: Insufficient documentation

## 2021-02-13 DIAGNOSIS — M79675 Pain in left toe(s): Secondary | ICD-10-CM | POA: Insufficient documentation

## 2021-02-13 DIAGNOSIS — R1310 Dysphagia, unspecified: Secondary | ICD-10-CM | POA: Insufficient documentation

## 2021-02-13 DIAGNOSIS — M7989 Other specified soft tissue disorders: Secondary | ICD-10-CM | POA: Diagnosis not present

## 2021-02-13 DIAGNOSIS — K219 Gastro-esophageal reflux disease without esophagitis: Secondary | ICD-10-CM | POA: Insufficient documentation

## 2021-02-13 DIAGNOSIS — Z5111 Encounter for antineoplastic chemotherapy: Secondary | ICD-10-CM

## 2021-02-13 DIAGNOSIS — T451X5A Adverse effect of antineoplastic and immunosuppressive drugs, initial encounter: Secondary | ICD-10-CM

## 2021-02-13 DIAGNOSIS — Z87891 Personal history of nicotine dependence: Secondary | ICD-10-CM | POA: Diagnosis not present

## 2021-02-13 DIAGNOSIS — R21 Rash and other nonspecific skin eruption: Secondary | ICD-10-CM | POA: Insufficient documentation

## 2021-02-13 DIAGNOSIS — Z800 Family history of malignant neoplasm of digestive organs: Secondary | ICD-10-CM | POA: Insufficient documentation

## 2021-02-13 DIAGNOSIS — Z87442 Personal history of urinary calculi: Secondary | ICD-10-CM | POA: Diagnosis not present

## 2021-02-13 DIAGNOSIS — Z86010 Personal history of colonic polyps: Secondary | ICD-10-CM | POA: Insufficient documentation

## 2021-02-13 DIAGNOSIS — Z8049 Family history of malignant neoplasm of other genital organs: Secondary | ICD-10-CM | POA: Diagnosis not present

## 2021-02-13 DIAGNOSIS — C799 Secondary malignant neoplasm of unspecified site: Secondary | ICD-10-CM

## 2021-02-13 DIAGNOSIS — Z79899 Other long term (current) drug therapy: Secondary | ICD-10-CM | POA: Insufficient documentation

## 2021-02-13 DIAGNOSIS — Z8249 Family history of ischemic heart disease and other diseases of the circulatory system: Secondary | ICD-10-CM | POA: Diagnosis not present

## 2021-02-13 DIAGNOSIS — Z808 Family history of malignant neoplasm of other organs or systems: Secondary | ICD-10-CM | POA: Insufficient documentation

## 2021-02-13 LAB — COMPREHENSIVE METABOLIC PANEL
ALT: 21 U/L (ref 0–44)
AST: 27 U/L (ref 15–41)
Albumin: 3.6 g/dL (ref 3.5–5.0)
Alkaline Phosphatase: 122 U/L (ref 38–126)
Anion gap: 9 (ref 5–15)
BUN: 15 mg/dL (ref 8–23)
CO2: 25 mmol/L (ref 22–32)
Calcium: 8.6 mg/dL — ABNORMAL LOW (ref 8.9–10.3)
Chloride: 102 mmol/L (ref 98–111)
Creatinine, Ser: 0.9 mg/dL (ref 0.61–1.24)
GFR, Estimated: >60 mL/min (ref >60)
Glucose, Bld: 129 mg/dL — ABNORMAL HIGH (ref 70–99)
Potassium: 4.1 mmol/L (ref 3.5–5.1)
Sodium: 136 mmol/L (ref 135–145)
Total Bilirubin: 0.6 mg/dL (ref 0.3–1.2)
Total Protein: 6.4 g/dL — ABNORMAL LOW (ref 6.5–8.1)

## 2021-02-13 LAB — CBC WITH DIFFERENTIAL/PLATELET
Abs Immature Granulocytes: 0.07 10*3/uL (ref 0.00–0.07)
Basophils Absolute: 0 10*3/uL (ref 0.0–0.1)
Basophils Relative: 1 %
Eosinophils Absolute: 0.2 10*3/uL (ref 0.0–0.5)
Eosinophils Relative: 3 %
HCT: 36.4 % — ABNORMAL LOW (ref 39.0–52.0)
Hemoglobin: 11.9 g/dL — ABNORMAL LOW (ref 13.0–17.0)
Immature Granulocytes: 1 %
Lymphocytes Relative: 14 %
Lymphs Abs: 0.9 10*3/uL (ref 0.7–4.0)
MCH: 30.9 pg (ref 26.0–34.0)
MCHC: 32.7 g/dL (ref 30.0–36.0)
MCV: 94.5 fL (ref 80.0–100.0)
Monocytes Absolute: 0.6 10*3/uL (ref 0.1–1.0)
Monocytes Relative: 9 %
Neutro Abs: 4.7 10*3/uL (ref 1.7–7.7)
Neutrophils Relative %: 72 %
Platelets: 234 10*3/uL (ref 150–400)
RBC: 3.85 MIL/uL — ABNORMAL LOW (ref 4.22–5.81)
RDW: 14.9 % (ref 11.5–15.5)
Smear Review: NORMAL
WBC: 6.5 10*3/uL (ref 4.0–10.5)
nRBC: 0 % (ref 0.0–0.2)

## 2021-02-13 MED ORDER — PALONOSETRON HCL INJECTION 0.25 MG/5ML
0.2500 mg | Freq: Once | INTRAVENOUS | Status: AC
  Administered 2021-02-13: 0.25 mg via INTRAVENOUS
  Filled 2021-02-13: qty 5

## 2021-02-13 MED ORDER — DIPHENHYDRAMINE HCL 50 MG/ML IJ SOLN
50.0000 mg | Freq: Once | INTRAMUSCULAR | Status: AC
  Administered 2021-02-13: 50 mg via INTRAVENOUS
  Filled 2021-02-13: qty 1

## 2021-02-13 MED ORDER — SODIUM CHLORIDE 0.9 % IV SOLN
10.0000 mg | Freq: Once | INTRAVENOUS | Status: AC
  Administered 2021-02-13: 10 mg via INTRAVENOUS
  Filled 2021-02-13: qty 10

## 2021-02-13 MED ORDER — HEPARIN SOD (PORK) LOCK FLUSH 100 UNIT/ML IV SOLN
500.0000 [IU] | Freq: Once | INTRAVENOUS | Status: DC | PRN
  Filled 2021-02-13: qty 5

## 2021-02-13 MED ORDER — SODIUM CHLORIDE 0.9% FLUSH
10.0000 mL | INTRAVENOUS | Status: DC | PRN
  Administered 2021-02-13: 10 mL via INTRAVENOUS
  Filled 2021-02-13: qty 10

## 2021-02-13 MED ORDER — SODIUM CHLORIDE 0.9 % IV SOLN
5000.0000 mg | INTRAVENOUS | Status: DC
  Administered 2021-02-13: 5000 mg via INTRAVENOUS
  Filled 2021-02-13: qty 100

## 2021-02-13 MED ORDER — LEUCOVORIN CALCIUM INJECTION 350 MG
850.0000 mg | Freq: Once | INTRAVENOUS | Status: AC
  Administered 2021-02-13: 850 mg via INTRAVENOUS
  Filled 2021-02-13: qty 42.5

## 2021-02-13 MED ORDER — FLUOROURACIL CHEMO INJECTION 2.5 GM/50ML
400.0000 mg/m2 | Freq: Once | INTRAVENOUS | Status: AC
  Administered 2021-02-13: 850 mg via INTRAVENOUS
  Filled 2021-02-13: qty 17

## 2021-02-13 MED ORDER — DEXTROSE 5 % IV SOLN
Freq: Once | INTRAVENOUS | Status: AC
  Filled 2021-02-13: qty 250

## 2021-02-13 MED ORDER — TRASTUZUMAB-ANNS CHEMO 150 MG IV SOLR
4.0000 mg/kg | Freq: Once | INTRAVENOUS | Status: AC
  Administered 2021-02-13: 357 mg via INTRAVENOUS
  Filled 2021-02-13: qty 17

## 2021-02-13 MED ORDER — ACETAMINOPHEN 325 MG PO TABS
650.0000 mg | ORAL_TABLET | Freq: Once | ORAL | Status: AC
  Administered 2021-02-13: 650 mg via ORAL
  Filled 2021-02-13: qty 2

## 2021-02-13 NOTE — Patient Instructions (Signed)
[facility]   Discharge Instructions: Thank you for choosing [facility] to provide your oncology and hematology care.  If you have a lab appointment with the [facility], please go directly to the [facility] and check in at the registration area.  Wear comfortable clothing and clothing appropriate for easy access to any Portacath or PICC line.   We strive to give you quality time with your provider. You may need to reschedule your appointment if you arrive late (15 or more minutes).  Arriving late affects you and other patients whose appointments are after yours.  Also, if you miss three or more appointments without notifying the office, you may be dismissed from the clinic at the provider's discretion.      For prescription refill requests, have your pharmacy contact our office and allow 72 hours for refills to be completed.    Today you received the following chemotherapy and/or immunotherapy agents - Kanjinti, 5FU      To help prevent nausea and vomiting after your treatment, we encourage you to take your nausea medication as directed.  BELOW ARE SYMPTOMS THAT SHOULD BE REPORTED IMMEDIATELY: *FEVER GREATER THAN 100.4 F (38 C) OR HIGHER *CHILLS OR SWEATING *NAUSEA AND VOMITING THAT IS NOT CONTROLLED WITH YOUR NAUSEA MEDICATION *UNUSUAL SHORTNESS OF BREATH *UNUSUAL BRUISING OR BLEEDING *URINARY PROBLEMS (pain or burning when urinating, or frequent urination) *BOWEL PROBLEMS (unusual diarrhea, constipation, pain near the anus) TENDERNESS IN MOUTH AND THROAT WITH OR WITHOUT PRESENCE OF ULCERS (sore throat, sores in mouth, or a toothache) UNUSUAL RASH, SWELLING OR PAIN  UNUSUAL VAGINAL DISCHARGE OR ITCHING   Items with * indicate a potential emergency and should be followed up as soon as possible or go to the Emergency Department if any problems should occur.  Please show the CHEMOTHERAPY ALERT CARD or IMMUNOTHERAPY ALERT CARD at  check-in to the Emergency Department and triage nurse.  Should you have questions after your visit or need to cancel or reschedule your appointment, please contact [facility]  [phone] and follow the prompts.  Office hours are 8:00 a.m. to 4:30 p.m. Monday - Friday. Please note that voicemails left after 4:00 p.m. may not be returned until the following business day.  We are closed weekends and major holidays. You have access to a nurse at all times for urgent questions. Please call the main number to the clinic [phone] and follow the prompts.  For any non-urgent questions, you may also contact your provider using MyChart. We now offer e-Visits for anyone 38 and older to request care online for non-urgent symptoms. For details visit [email or web address].   Also download the MyChart app! Go to the app store, search "MyChart", open the app, select [facility], and log in with your MyChart username and password.  Due to Covid, a mask is required upon entering the hospital/clinic. If you do not have a mask, one will be given to you upon arrival. For doctor visits, patients may have 1 support person aged 61 or older with them. For treatment visits, patients cannot have anyone with them due to current Covid guidelines and our immunocompromised population.   Acetaminophen Capsules or Tablets What is this medication? ACETAMINOPHEN (a set a MEE noe fen) treats mild to moderate pain. It may alsobe used to reduce fever. This medicine may be used for other purposes; ask your health care provider orpharmacist if you have questions. COMMON BRAND NAME(S): Aceta, Actamin, Anacin Aspirin  Free, Genapap, Genebs, Mapap, Pain & Fever, Pain and Fever, PAIN RELIEF, PAIN RELIEF Extra Strength, Pain Reliever, Panadol, PHARBETOL, Plus PHARMA, Q-Pap, Q-Pap Extra Strength, Tylenol, Tylenol CrushableTablet, Tylenol Extra Strength, Tylenol RegularStrength, XS No Aspirin, XS Pain  Reliever What should I tell my care team before I take this medication? They need to know if you have any of these conditions: If you often drink alcohol Liver disease An unusual or allergic reaction to acetaminophen, other medications, foods, dyes, or preservatives Pregnant or trying to get pregnant Breast-feeding How should I use this medication? Take this medication by mouth with a glass of water. Follow the directions on the package or prescription label. Take your medication at regular intervals.Do not take your medication more often than directed. Talk to your care team about the use of this medication in children. While this medication may be prescribed for children as young as 59 years of age forselected conditions, precautions do apply. Overdosage: If you think you have taken too much of this medicine contact apoison control center or emergency room at once. NOTE: This medicine is only for you. Do not share this medicine with others. What if I miss a dose? If you miss a dose, take it as soon as you can. If it is almost time for yournext dose, take only that dose. Do not take double or extra doses. What may interact with this medication? Alcohol Imatinib Isoniazid Other medications with acetaminophen This list may not describe all possible interactions. Give your health care provider a list of all the medicines, herbs, non-prescription drugs, or dietary supplements you use. Also tell them if you smoke, drink alcohol, or use illegaldrugs. Some items may interact with your medicine. What should I watch for while using this medication? Tell your care team if the pain lasts more than 10 days (5 days for children), if it gets worse, or if there is a new or different kind of pain. Also, checkwith your care team if a fever lasts for more than 3 days. Do not take other medications that contain acetaminophen with this one. Alwaysread labels carefully. If you have questions, ask your care  team. If you take too much acetaminophen, get medical help right away. Too much acetaminophen can be very dangerous and cause liver damage. Even if you do nothave symptoms, it is important to get help right away. What side effects may I notice from receiving this medication? Side effects that you should report to your care team as soon as possible: Allergic reactions-skin rash, itching, hives, swelling of the face, lips, tongue, or throat Liver injury-right upper belly pain, loss of appetite, nausea, light-colored stool, dark yellow or brown urine, yellowing skin or eyes, unusual weakness or fatigue Redness, blistering, peeling, or loosening of the skin, including inside the mouth Side effects that usually do not require medical attention (report to your careteam if they continue or are bothersome): Headache Nausea Trouble sleeping Upset stomach This list may not describe all possible side effects. Call your doctor for medical advice about side effects. You may report side effects to FDA at1-800-FDA-1088. Where should I keep my medication? Keep out of reach of children and pets. Store at room temperature between 20 and 25 degrees C (68 and 77 degrees F). Protect from moisture and heat. Throw away any unused medication after theexpiration date. NOTE: This sheet is a summary. It may not cover all possible information. If you have questions about this medicine, talk to your doctor, pharmacist, orhealth care provider.  2022 Elsevier/Gold Standard (2020-05-09 11:07:55)  Diphenhydramine Injection What is this medication? DIPHENHYDRAMINE (dye fen HYE dra meen) treats the symptoms of allergic reactions. It may also be used to prevent and treat motion sickness or the symptoms of Parkinson disease. It works by blocking histamine, a substance released by the body during an allergic reaction. It belongs to a group ofmedications called antihistamines. This medicine may be used for other purposes; ask your  health care provider orpharmacist if you have questions. COMMON BRAND NAME(S): Benadryl What should I tell my care team before I take this medication? They need to know if you have any of these conditions: Asthma or lung disease Glaucoma High blood pressure or heart disease Liver disease Pain or difficulty passing urine Prostate trouble Ulcers or other stomach problems An unusual or allergic reaction to diphenhydramine, antihistamines, other medications foods, dyes, or preservatives Pregnant or trying to get pregnant Breast-feeding How should I use this medication? This medication is for injection into a vein or a muscle. It is usually givenin a hospital or clinic. If you get this medication at home, you will be taught how to prepare and give this medication. Use exactly as directed. Take your medication at regularintervals. Do not take your medication more often than directed. It is important that you put your used needles and syringes in a special sharps container. Do not put them in a trash can. If you do not have a sharpscontainer, call your pharmacist or care team to get one. Talk to your care team regarding the use of this medication in children. While this medication may be prescribed for selected conditions, precautions doapply. This medication is not approved for use in newborns and premature babies. Patients over 43 years old may have a stronger reaction and need a smaller dose. Overdosage: If you think you have taken too much of this medicine contact apoison control center or emergency room at once. NOTE: This medicine is only for you. Do not share this medicine with others. What if I miss a dose? If you miss a dose, take it as soon as you can. If it is almost time for yournext dose, take only that dose. Do not take double or extra doses. What may interact with this medication? Do not take this medication with any of the following: MAOIs like Carbex, Eldepryl, Marplan, Nardil,  and Parnate This medication may also interact with the following: Alcohol Barbiturates, like phenobarbital Medications for bladder spasm like oxybutynin, tolterodine Medications for blood pressure Medications for depression, anxiety, or psychotic disturbances Medications for movement abnormalities or Parkinson disease Medications for sleep Other medications for cold, cough or allergy Some medications for the stomach like chlordiazepoxide, dicyclomine This list may not describe all possible interactions. Give your health care provider a list of all the medicines, herbs, non-prescription drugs, or dietary supplements you use. Also tell them if you smoke, drink alcohol, or use illegaldrugs. Some items may interact with your medicine. What should I watch for while using this medication? Your condition will be monitored carefully while you are receiving this medication. Tell your care team if your symptoms do not start to get better orif they get worse. You may get drowsy or dizzy. Do not drive, use machinery, or do anything that needs mental alertness until you know how this medication affects you. Do not stand or sit up quickly, especially if you are an older patient. This reduces the risk of dizzy or fainting spells. Alcohol may interfere with the effect ofthis medication.  Avoid alcoholic drinks. Your mouth may get dry. Chewing sugarless gum or sucking hard candy, and drinking plenty of water may help. Contact your care team if the problem doesnot go away or is severe. What side effects may I notice from receiving this medication? Side effects that you should report to your care team as soon as possible: Allergic reactions-skin rash, itching, hives, swelling of the face, lips, tongue, or throat Sudden eye pain or change in vision such as blurry vision, seeing halos around lights, vision loss Trouble passing urine Side effects that usually do not require medical attention (report to your careteam  if they continue or are bothersome): Constipation Drowsiness Dry mouth Headache Upset stomach This list may not describe all possible side effects. Call your doctor for medical advice about side effects. You may report side effects to FDA at1-800-FDA-1088. Where should I keep my medication? Keep out of the reach of children and pets. If you are using this medication at home, you will be instructed on how to store this medication. Throw away any unused medication after the expirationdate on the label. NOTE: This sheet is a summary. It may not cover all possible information. If you have questions about this medicine, talk to your doctor, pharmacist, orhealth care provider.  2022 Elsevier/Gold Standard (2020-07-21 11:52:56)  Palonosetron Injection What is this medication? PALONOSETRON (pal oh NOE se tron) is used to prevent nausea and vomiting caused by chemotherapy. It also helps prevent delayed nausea and vomiting that mayoccur a few days after your treatment. This medicine may be used for other purposes; ask your health care provider orpharmacist if you have questions. COMMON BRAND NAME(S): Aloxi What should I tell my care team before I take this medication? They need to know if you have any of these conditions: an unusual or allergic reaction to palonosetron, dolasetron, granisetron, ondansetron, other medicines, foods, dyes, or preservatives pregnant or trying to get pregnant breast-feeding How should I use this medication? This medicine is for infusion into a vein. It is given by a health careprofessional in a hospital or clinic setting. Talk to your pediatrician regarding the use of this medicine in children. While this drug may be prescribed for children as young as 1 month for selectedconditions, precautions do apply. Overdosage: If you think you have taken too much of this medicine contact apoison control center or emergency room at once. NOTE: This medicine is only for you. Do not  share this medicine with others. What if I miss a dose? This does not apply. What may interact with this medication? certain medicines for depression, anxiety, or psychotic disturbances fentanyl linezolid MAOIs like Carbex, Eldepryl, Marplan, Nardil, and Parnate methylene blue (injected into a vein) tramadol This list may not describe all possible interactions. Give your health care provider a list of all the medicines, herbs, non-prescription drugs, or dietary supplements you use. Also tell them if you smoke, drink alcohol, or use illegaldrugs. Some items may interact with your medicine. What should I watch for while using this medication? Your condition will be monitored carefully while you are receiving thismedicine. What side effects may I notice from receiving this medication? Side effects that you should report to your doctor or health care professionalas soon as possible: allergic reactions like skin rash, itching or hives, swelling of the face, lips, or tongue breathing problems confusion dizziness fast, irregular heartbeat fever and chills loss of balance or coordination seizures sweating swelling of the hands and feet tremors unusually weak or tired Side effects  that usually do not require medical attention (report to yourdoctor or health care professional if they continue or are bothersome): constipation or diarrhea headache This list may not describe all possible side effects. Call your doctor for medical advice about side effects. You may report side effects to FDA at1-800-FDA-1088. Where should I keep my medication? This drug is given in a hospital or clinic and will not be stored at home. NOTE: This sheet is a summary. It may not cover all possible information. If you have questions about this medicine, talk to your doctor, pharmacist, orhealth care provider.  2022 Elsevier/Gold Standard (2013-04-30 10:38:36)  Dexamethasone injection What is this  medication? DEXAMETHASONE (dex a METH a sone) is a corticosteroid. It is used to treat inflammation of the skin, joints, lungs, and other organs. Common conditions treated include asthma, allergies, and arthritis. It is also used for otherconditions, like blood disorders and diseases of the adrenal glands. This medicine may be used for other purposes; ask your health care provider orpharmacist if you have questions. COMMON BRAND NAME(S): Decadron, DoubleDex, ReadySharp Dexamethasone, SimplistDexamethasone, Solurex What should I tell my care team before I take this medication? They need to know if you have any of these conditions: Cushing's syndrome diabetes glaucoma heart disease high blood pressure infection like herpes, measles, tuberculosis, or chickenpox kidney disease liver disease mental illness myasthenia gravis osteoporosis previous heart attack seizures stomach or intestine problems thyroid disease an unusual or allergic reaction to dexamethasone, corticosteroids, other medicines, lactose, foods, dyes, or preservatives pregnant or trying to get pregnant breast-feeding How should I use this medication? This medicine is for injection into a muscle, joint, lesion, soft tissue, orvein. It is given by a health care professional in a hospital or clinic setting. Talk to your pediatrician regarding the use of this medicine in children.Special care may be needed. Overdosage: If you think you have taken too much of this medicine contact apoison control center or emergency room at once. NOTE: This medicine is only for you. Do not share this medicine with others. What if I miss a dose? This may not apply. If you are having a series of injections over a prolonged period, try not to miss an appointment. Call your doctor or health careprofessional to reschedule if you are unable to keep an appointment. What may interact with this medication? Do not take this medicine with any of the  following medications: live virus vaccines This medicine may also interact with the following medications: aminoglutethimide amphotericin B aspirin and aspirin-like medicines certain antibiotics like erythromycin, clarithromycin, and troleandomycin certain antivirals for HIV or hepatitis certain medicines for seizures like carbamazepine, phenobarbital, phenytoin certain medicines to treat myasthenia gravis cholestyramine cyclosporine digoxin diuretics ephedrine male hormones, like estrogen or progestins and birth control pills insulin or other medicines for diabetes isoniazid ketoconazole medicines that relax muscles for surgery mifepristone NSAIDs, medicines for pain and inflammation, like ibuprofen or naproxen rifampin skin tests for allergies thalidomide vaccines warfarin This list may not describe all possible interactions. Give your health care provider a list of all the medicines, herbs, non-prescription drugs, or dietary supplements you use. Also tell them if you smoke, drink alcohol, or use illegaldrugs. Some items may interact with your medicine. What should I watch for while using this medication? Visit your health care professional for regular checks on your progress. Tell your health care professional if your symptoms do not start to get better or if they get worse. Your condition will be monitored carefully while you arereceiving  this medicine. Wear a medical ID bracelet or chain. Carry a card that describes your diseaseand details of your medicine and dosage times. This medicine may increase your risk of getting an infection. Call your health care professional for advice if you get a fever, chills, or sore throat, or other symptoms of a cold or flu. Do not treat yourself. Try to avoid being around people who are sick. Call your health care professional if you are around anyone with measles, chickenpox, or if you develop sores or blistersthat do not heal properly. If  you are going to need surgery or other procedures, tell your doctor or health care professional that you have taken this medicine within the last 9month. Ask your doctor or health care professional about your diet. You may need tolower the amount of salt you eat. This medicine may increase blood sugar. Ask your healthcare provider if changesin diet or medicines are needed if you have diabetes. What side effects may I notice from receiving this medication? Side effects that you should report to your doctor or health care professionalas soon as possible: allergic reactions like skin rash, itching or hives, swelling of the face, lips, or tongue bloody or black, tarry stools changes in emotions or moods changes in vision confusion, excitement, restlessness depressed mood eye pain hallucinations muscle weakness severe or sudden stomach or belly pain signs and symptoms of high blood sugar such as being more thirsty or hungry or having to urinate more than normal. You may also feel very tired or have blurry vision. signs and symptoms of infection like fever; chills; cough; sore throat; pain or trouble passing urine swelling of ankles, feet unusual bruising or bleeding wounds that do not heal Side effects that usually do not require medical attention (report to yourdoctor or health care professional if they continue or are bothersome): increased appetite increased growth of face or body hair headache nausea, vomiting pain, redness, or irritation at site where injected skin problems, acne, thin and shiny skin trouble sleeping weight gain This list may not describe all possible side effects. Call your doctor for medical advice about side effects. You may report side effects to FDA at1-800-FDA-1088. Where should I keep my medication? This medicine is given in a hospital or clinic and will not be stored at home. NOTE: This sheet is a summary. It may not cover all possible information. If you  have questions about this medicine, talk to your doctor, pharmacist, orhealth care provider.  2022 Elsevier/Gold Standard (2019-01-05 13:51:58)  Trastuzumab injection for infusion What is this medication? TRASTUZUMAB (tras TOO zoo mab) is a monoclonal antibody. It is used to treatbreast cancer and stomach cancer. This medicine may be used for other purposes; ask your health care provider orpharmacist if you have questions. COMMON BRAND NAME(S): Herceptin, HJanae Bridgeman Ontruzant, Trazimera What should I tell my care team before I take this medication? They need to know if you have any of these conditions: heart disease heart failure lung or breathing disease, like asthma an unusual or allergic reaction to trastuzumab, benzyl alcohol, or other medications, foods, dyes, or preservatives pregnant or trying to get pregnant breast-feeding How should I use this medication? This drug is given as an infusion into a vein. It is administered in a hospitalor clinic by a specially trained health care professional. Talk to your pediatrician regarding the use of this medicine in children. Thismedicine is not approved for use in children. Overdosage: If you think you have taken too much of  this medicine contact apoison control center or emergency room at once. NOTE: This medicine is only for you. Do not share this medicine with others. What if I miss a dose? It is important not to miss a dose. Call your doctor or health careprofessional if you are unable to keep an appointment. What may interact with this medication? This medicine may interact with the following medications: certain types of chemotherapy, such as daunorubicin, doxorubicin, epirubicin, and idarubicin This list may not describe all possible interactions. Give your health care provider a list of all the medicines, herbs, non-prescription drugs, or dietary supplements you use. Also tell them if you smoke, drink alcohol, or use  illegaldrugs. Some items may interact with your medicine. What should I watch for while using this medication? Visit your doctor for checks on your progress. Report any side effects. Continue your course of treatment even though you feel ill unless your doctortells you to stop. Call your doctor or health care professional for advice if you get a fever, chills or sore throat, or other symptoms of a cold or flu. Do not treatyourself. Try to avoid being around people who are sick. You may experience fever, chills and shaking during your first infusion. These effects are usually mild and can be treated with other medicines. Report any side effects during the infusion to your health care professional. Fever andchills usually do not happen with later infusions. Do not become pregnant while taking this medicine or for 7 months after stopping it. Women should inform their doctor if they wish to become pregnant or think they might be pregnant. Women of child-bearing potential will need to have a negative pregnancy test before starting this medicine. There is a potential for serious side effects to an unborn child. Talk to your health care professional or pharmacist for more information. Do not breast-feed an infantwhile taking this medicine or for 7 months after stopping it. Women must use effective birth control with this medicine. What side effects may I notice from receiving this medication? Side effects that you should report to your doctor or health care professionalas soon as possible: allergic reactions like skin rash, itching or hives, swelling of the face, lips, or tongue chest pain or palpitations cough dizziness feeling faint or lightheaded, falls fever general ill feeling or flu-like symptoms signs of worsening heart failure like breathing problems; swelling in your legs and feet unusually weak or tired Side effects that usually do not require medical attention (report to yourdoctor or health  care professional if they continue or are bothersome): bone pain changes in taste diarrhea joint pain nausea/vomiting weight loss This list may not describe all possible side effects. Call your doctor for medical advice about side effects. You may report side effects to FDA at1-800-FDA-1088. Where should I keep my medication? This drug is given in a hospital or clinic and will not be stored at home. NOTE: This sheet is a summary. It may not cover all possible information. If you have questions about this medicine, talk to your doctor, pharmacist, orhealth care provider.  2022 Elsevier/Gold Standard (2016-06-18 14:37:52)  Leucovorin injection What is this medication? LEUCOVORIN (loo koe VOR in) is used to prevent or treat the harmful effects of some medicines. This medicine is used to treat anemia caused by a low amount of folic acid in the body. It is also used with 5-fluorouracil (5-FU) to treatcolon cancer. This medicine may be used for other purposes; ask your health care provider orpharmacist if you have  questions. What should I tell my care team before I take this medication? They need to know if you have any of these conditions: anemia from low levels of vitamin B-12 in the blood an unusual or allergic reaction to leucovorin, folic acid, other medicines, foods, dyes, or preservatives pregnant or trying to get pregnant breast-feeding How should I use this medication? This medicine is for injection into a muscle or into a vein. It is given by ahealth care professional in a hospital or clinic setting. Talk to your pediatrician regarding the use of this medicine in children.Special care may be needed. Overdosage: If you think you have taken too much of this medicine contact apoison control center or emergency room at once. NOTE: This medicine is only for you. Do not share this medicine with others. What if I miss a dose? This does not apply. What may interact with this  medication? capecitabine fluorouracil phenobarbital phenytoin primidone trimethoprim-sulfamethoxazole This list may not describe all possible interactions. Give your health care provider a list of all the medicines, herbs, non-prescription drugs, or dietary supplements you use. Also tell them if you smoke, drink alcohol, or use illegaldrugs. Some items may interact with your medicine. What should I watch for while using this medication? Your condition will be monitored carefully while you are receiving thismedicine. This medicine may increase the side effects of 5-fluorouracil, 5-FU. Tell your doctor or health care professional if you have diarrhea or mouth sores that donot get better or that get worse. What side effects may I notice from receiving this medication? Side effects that you should report to your doctor or health care professionalas soon as possible: allergic reactions like skin rash, itching or hives, swelling of the face, lips, or tongue breathing problems fever, infection mouth sores unusual bleeding or bruising unusually weak or tired Side effects that usually do not require medical attention (report to yourdoctor or health care professional if they continue or are bothersome): constipation or diarrhea loss of appetite nausea, vomiting This list may not describe all possible side effects. Call your doctor for medical advice about side effects. You may report side effects to FDA at1-800-FDA-1088. Where should I keep my medication? This drug is given in a hospital or clinic and will not be stored at home. NOTE: This sheet is a summary. It may not cover all possible information. If you have questions about this medicine, talk to your doctor, pharmacist, orhealth care provider.  2022 Elsevier/Gold Standard (2007-12-29 16:50:29)  Fluorouracil, 5-FU injection What is this medication? FLUOROURACIL, 5-FU (flure oh YOOR a sil) is a chemotherapy drug. It slows the growth of  cancer cells. This medicine is used to treat many types of cancer like breast cancer, colon or rectal cancer, pancreatic cancer, and stomachcancer. This medicine may be used for other purposes; ask your health care provider orpharmacist if you have questions. COMMON BRAND NAME(S): Adrucil What should I tell my care team before I take this medication? They need to know if you have any of these conditions: blood disorders dihydropyrimidine dehydrogenase (DPD) deficiency infection (especially a virus infection such as chickenpox, cold sores, or herpes) kidney disease liver disease malnourished, poor nutrition recent or ongoing radiation therapy an unusual or allergic reaction to fluorouracil, other chemotherapy, other medicines, foods, dyes, or preservatives pregnant or trying to get pregnant breast-feeding How should I use this medication? This drug is given as an infusion or injection into a vein. It is administeredin a hospital or clinic by a specially trained health  care professional. Talk to your pediatrician regarding the use of this medicine in children.Special care may be needed. Overdosage: If you think you have taken too much of this medicine contact apoison control center or emergency room at once. NOTE: This medicine is only for you. Do not share this medicine with others. What if I miss a dose? It is important not to miss your dose. Call your doctor or health careprofessional if you are unable to keep an appointment. What may interact with this medication? Do not take this medicine with any of the following medications: live virus vaccines This medicine may also interact with the following medications: medicines that treat or prevent blood clots like warfarin, enoxaparin, and dalteparin This list may not describe all possible interactions. Give your health care provider a list of all the medicines, herbs, non-prescription drugs, or dietary supplements you use. Also tell them if  you smoke, drink alcohol, or use illegaldrugs. Some items may interact with your medicine. What should I watch for while using this medication? Visit your doctor for checks on your progress. This drug may make you feel generally unwell. This is not uncommon, as chemotherapy can affect healthy cells as well as cancer cells. Report any side effects. Continue your course oftreatment even though you feel ill unless your doctor tells you to stop. In some cases, you may be given additional medicines to help with side effects.Follow all directions for their use. Call your doctor or health care professional for advice if you get a fever, chills or sore throat, or other symptoms of a cold or flu. Do not treat yourself. This drug decreases your body's ability to fight infections. Try toavoid being around people who are sick. This medicine may increase your risk to bruise or bleed. Call your doctor orhealth care professional if you notice any unusual bleeding. Be careful brushing and flossing your teeth or using a toothpick because you may get an infection or bleed more easily. If you have any dental work done,tell your dentist you are receiving this medicine. Avoid taking products that contain aspirin, acetaminophen, ibuprofen, naproxen, or ketoprofen unless instructed by your doctor. These medicines may hide afever. Do not become pregnant while taking this medicine. Women should inform their doctor if they wish to become pregnant or think they might be pregnant. There is a potential for serious side effects to an unborn child. Talk to your health care professional or pharmacist for more information. Do not breast-feed aninfant while taking this medicine. Men should inform their doctor if they wish to father a child. This medicinemay lower sperm counts. Do not treat diarrhea with over the counter products. Contact your doctor ifyou have diarrhea that lasts more than 2 days or if it is severe and watery. This  medicine can make you more sensitive to the sun. Keep out of the sun. If you cannot avoid being in the sun, wear protective clothing and use sunscreen.Do not use sun lamps or tanning beds/booths. What side effects may I notice from receiving this medication? Side effects that you should report to your doctor or health care professionalas soon as possible: allergic reactions like skin rash, itching or hives, swelling of the face, lips, or tongue low blood counts - this medicine may decrease the number of Nastacia Raybuck blood cells, red blood cells and platelets. You may be at increased risk for infections and bleeding. signs of infection - fever or chills, cough, sore throat, pain or difficulty passing urine signs of decreased platelets or  bleeding - bruising, pinpoint red spots on the skin, black, tarry stools, blood in the urine signs of decreased red blood cells - unusually weak or tired, fainting spells, lightheadedness breathing problems changes in vision chest pain mouth sores nausea and vomiting pain, swelling, redness at site where injected pain, tingling, numbness in the hands or feet redness, swelling, or sores on hands or feet stomach pain unusual bleeding Side effects that usually do not require medical attention (report to yourdoctor or health care professional if they continue or are bothersome): changes in finger or toe nails diarrhea dry or itchy skin hair loss headache loss of appetite sensitivity of eyes to the light stomach upset unusually teary eyes This list may not describe all possible side effects. Call your doctor for medical advice about side effects. You may report side effects to FDA at1-800-FDA-1088. Where should I keep my medication? This drug is given in a hospital or clinic and will not be stored at home. NOTE: This sheet is a summary. It may not cover all possible information. If you have questions about this medicine, talk to your doctor, pharmacist, orhealth  care provider.  2022 Elsevier/Gold Standard (2019-05-25 15:00:03)

## 2021-02-13 NOTE — Progress Notes (Signed)
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  He underwent EGD on 06/20/2020 By Dr. Vicente Males which showed a large nearly obstructing mass in the lower third of the esophagus 35 cm from incisors.  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  PD-l1 <1 %, TMB HIGH    Presently patient reports dysphagia but is able to eat cereal or soft food.  He has lost about 13 pounds in the last month itself.  Denies any significant pain.  He recently retired after many years of service and is otherwise independent of his ADLs and IADLs.     PET CT scan on 06/26/2020 showed circumferential distal esophageal mass with an SUV of 13.3 extending over 5.5 cm. Right lower paratracheal node is 0.6 cm with an SUV of 3.1. Hypermetabolic right gastric lymph nodes including 1.1 cm node with an SUV of 5.8. Numerous hypermetabolic lesions throughout the liver somewhat confluent around the periphery which were hypermetabolic between 8 and 9. Faintly hypermetabolic left adrenal gland   NGS testing showed high tumor mutational burden CPS score less than 1. CD9-NRG1 fusion. BRAF 581L, CCN E1 gain.  EMLA for fusion of BX [id number], FGF 23 gain, FGF 6 gain, MET R988C, T p53 S1 66F. ERBB2 gain but no other actionable mutations   Patient  currently on first-line FOLFOX/trastuzumab/Keytruda.  He gets Keytruda every 6 weeks    Interval history-he had pain and swelling in his left big toe.  He was concerned that there was an abscess and self drained it.  He also received a course of antibiotics from Dr. Silvio Pate.  He had a low-grade fever on Sunday but none since then.  Otherwise he is doing well and denies any complaints at this time  ECOG PS- 1 Pain scale- 0   Review of systems- Review of Systems  Constitutional:  Negative for chills, fever, malaise/fatigue and weight loss.  HENT:  Negative for congestion, ear discharge and nosebleeds.   Eyes:  Negative for blurred vision.  Respiratory:  Negative for cough, hemoptysis, sputum production, shortness of breath and wheezing.   Cardiovascular:  Negative for chest pain, palpitations, orthopnea and claudication.  Gastrointestinal:  Negative for abdominal pain, blood in stool, constipation, diarrhea, heartburn, melena, nausea and vomiting.  Genitourinary:  Negative for dysuria, flank pain, frequency, hematuria and urgency.  Musculoskeletal:  Negative for back pain, joint pain and myalgias.  Skin:  Negative for rash.  Neurological:  Negative for dizziness, tingling, focal weakness, seizures, weakness and headaches.  Endo/Heme/Allergies:  Does not bruise/bleed easily.  Psychiatric/Behavioral:  Negative for depression and suicidal ideas. The patient does not have insomnia.     No Known Allergies   Past Medical History:  Diagnosis Date   Esophageal adenocarcinoma ([place]) 06/23/2020   Family history of brain cancer    Family history of colon cancer    Family history of melanoma  Family history of stomach cancer    GERD (gastroesophageal reflux disease)    History of kidney stones    Nephrolithiasis    [facility]   Pancreatitis, acute    Personal history of colonic polyps    Dr Nicolasa Ducking   Psoriasis      Past Surgical History:  Procedure Laterality Date   COLONOSCOPY      COLONOSCOPY WITH PROPOFOL N/A 03/31/2020   Procedure: COLONOSCOPY WITH PROPOFOL;  Surgeon: Jonathon Bellows, MD;  Location: [facility] ENDOSCOPY;  Service: Gastroenterology;  Laterality: N/A;   ERCP     ESOPHAGOGASTRODUODENOSCOPY (EGD) WITH PROPOFOL N/A 06/19/2020   Procedure: ESOPHAGOGASTRODUODENOSCOPY (EGD) WITH PROPOFOL;  Surgeon: Jonathon Bellows, MD;  Location: [facility] ENDOSCOPY;  Service: Gastroenterology;  Laterality: N/A;   groin surgery     as a child   PORTA CATH INSERTION N/A 06/28/2020   Procedure: PORTA CATH INSERTION;  Surgeon: Algernon Huxley, MD;  Location: [place] CV LAB;  Service: Cardiovascular;  Laterality: N/A;   SHOULDER SURGERY      Social History   Socioeconomic History   Marital status: Married    Spouse name: Not on file   Number of children: 2   Years of education: Not on file   Highest education level: Not on file  Occupational History   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  Tobacco Use   Smoking status: Former    Types: Cigarettes    Quit date: 07/08/1988    Years since quitting: 32.6   Smokeless tobacco: Never  Vaping Use   Vaping Use: Never used  Substance and Sexual Activity   Alcohol use: No    Comment: recovering alcoholic for 13 years   Drug use: No   Sexual activity: Not Currently  Other Topics Concern   Not on file  Social History Narrative   Has living will--needs to get notarized   Daughter should be health care POA (wife with dementia)   Would accept resuscitation   Would accept tube feeds depending on the circumstance   Social Determinants of Health   Financial Resource Strain: Not on file  Food Insecurity: Not on file  Transportation Needs: Not on file  Physical Activity: Not on file  Stress: Not on file  Social Connections: Not on file  Intimate Partner Violence: Not on file    Family History  Problem Relation Age of Onset   Stomach cancer Mother    Diabetes Brother    Hypertension Brother    Hypertension  Father    Prostate cancer Father    Colon cancer Sister    Cancer Other        either cervical or ovarian, d. 95s   Brain cancer Sister      Current Outpatient Medications:    cephALEXin (KEFLEX) 500 MG capsule, Take 1 capsule (500 mg total) by mouth 3 (three) times daily., Disp: 15 capsule, Rfl: 1   dexamethasone (DECADRON) 4 MG tablet, Take 2 tablets (8 mg total) by mouth daily. Start the day after chemotherapy for 2 days. Take with food., Disp: 30 tablet, Rfl: 1   lidocaine-prilocaine (EMLA) cream, Apply 1 application topically as needed. Apply small amount to port site at least 1 hour prior to it being accessed, cover with plastic wrap, Disp: 30 g, Rfl: 3   LORazepam (ATIVAN) 0.5 MG tablet, Take 1 tablet (0.5 mg total) by mouth every 6 (six) hours as needed (Nausea or vomiting)., Disp: 30 tablet, Rfl: 0  ondansetron (ZOFRAN) 8 MG tablet, Take 1 tablet (8 mg total) by mouth 2 (two) times daily as needed for refractory nausea / vomiting. Start on day 3 after chemotherapy., Disp: 30 tablet, Rfl: 1   oxyCODONE (OXY IR/ROXICODONE) 5 MG immediate release tablet, Take 1 tablet (5 mg total) by mouth every 8 (eight) hours as needed for severe pain., Disp: 60 tablet, Rfl: 0   pantoprazole (PROTONIX) 40 MG tablet, TAKE 1 TABLET BY MOUTH TWICE A DAY BEFORE MEALS, Disp: 60 tablet, Rfl: 3   prochlorperazine (COMPAZINE) 10 MG tablet, Take 1 tablet (10 mg total) by mouth every 6 (six) hours as needed (Nausea or vomiting)., Disp: 30 tablet, Rfl: 1   triamcinolone cream (KENALOG) 0.1 %, Apply 1 application topically 3 (three) times daily as needed., Disp: 300 each, Rfl: 1 No current facility-administered medications for this visit.  Facility-Administered Medications Ordered in Other Visits:    sodium chloride flush (NS) 0.9 % injection 10 mL, 10 mL, Intracatheter, PRN, [name], MD, 10 mL at 01/04/21 1149   sodium chloride flush (NS) 0.9 % injection 10 mL, 10 mL, Intravenous, PRN, [name], MD,  10 mL at 02/13/21 0812  Physical exam:  Vitals:   02/13/21 0830  BP: (!) 134/57  Pulse: 72  Temp: (!) 97.5 F (36.4 C)  TempSrc: Tympanic  SpO2: 100%  Weight: 194 lb 3.2 oz (88.1 kg)   Physical Exam Cardiovascular:     Rate and Rhythm: Normal rate and regular rhythm.     Heart sounds: Normal heart sounds.  Pulmonary:     Effort: Pulmonary effort is normal.     Breath sounds: Normal breath sounds.  Abdominal:     General: Bowel sounds are normal.     Palpations: Abdomen is soft.  Musculoskeletal:     Comments: Left big toe appears mildly swollen but no erythema or fluctuance noted  Skin:    General: Skin is warm and dry.  Neurological:     Mental Status: He is alert and oriented to person, place, and time.     CMP Latest Ref Rng & Units 02/13/2021  Glucose 70 - 99 mg/dL 129(H)  BUN 8 - 23 mg/dL 15  Creatinine 0.61 - 1.24 mg/dL 0.90  Sodium 135 - 145 mmol/L 136  Potassium 3.5 - 5.1 mmol/L 4.1  Chloride 98 - 111 mmol/L 102  CO2 22 - 32 mmol/L 25  Calcium 8.9 - 10.3 mg/dL 8.6(L)  Total Protein 6.5 - 8.1 g/dL 6.4(L)  Total Bilirubin 0.3 - 1.2 mg/dL 0.6  Alkaline Phos 38 - 126 U/L 122  AST 15 - 41 U/L 27  ALT 0 - 44 U/L 21   CBC Latest Ref Rng & Units 02/13/2021  WBC 4.0 - 10.5 K/uL 6.5  Hemoglobin 13.0 - 17.0 g/dL 11.9(L)  Hematocrit 39.0 - 52.0 % 36.4(L)  Platelets 150 - 400 K/uL 234     Assessment and plan- Patient is a 65 y.o. 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  He is here for on treatment assessment prior to cycle 17 of 5-FU trastuzumab chemotherapy  Patient received Keytruda last time and therefore does not require it on treatment cycle 19.  Counts otherwise okay to proceed with cycle 17 of 5-FU trastuzumab chemotherapy today with pump disconnect on day 3 and receives [place].  Repeat echocardiogram has been scheduled in 2 days time.  Recent CT scan on 01/09/2021 showed continued response to treatment as evidenced  by decrease in size and conspicuity of multiple liver lesions.  Repeat scans will be due in October 2022.  I will see him back in 2 weeks time for cycle 18 of 5-FU trastuzumab chemotherapy  Mild normocytic anemia: Likely secondary to chemotherapy continue to monitor   Visit Diagnosis 1. Encounter for monoclonal antibody treatment for malignancy   2. Encounter for antineoplastic chemotherapy   3. Esophageal adenocarcinoma ([place])   4. Antineoplastic chemotherapy induced anemia      Dr. Randa Evens, MD, MPH [facility] [phone] 02/13/2021 8:21 AM

## 2021-02-13 NOTE — Progress Notes (Signed)
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  Patient receiving 5-FU and trastuzumab.    Met with patient during infusion.  Patient reports that for few days around the first of the month did not feel good and did not eat well, feverish.  Felt to have toe infection and was placed on antibiotics.  Patient reports that he is feeling better and appetite has improved.  Reports that he cooks good breakfast and supper each day for he and his wife.  Usually has sandwich sometimes with bread or without and drinks boost shake at lunch.  Denies trouble swallowing    Medications: reviewed  Labs: reviewed  Anthropometrics:   Weight 194 lb 3.2 oz today  188 lb on 8/2 197 lb 2 oz on 7/12 195 lb on 6/28 191 lb on 6/14 194 lb on 5/31 197 lb on 5/3 230 lb on Jan 2021   NUTRITION DIAGNOSIS: Inadequate oral intake stable    INTERVENTION:  Continue at least 1 boost plus shake daily. If weight drops recommend BID Continue high calorie, high protein foods    MONITORING, EVALUATION, GOAL: weight trends, intake   NEXT VISIT: to be scheduled with treatment ~4-6 weeks  Cory [name] B. Cory [name], [place], [place] Registered Dietitian [phone] (mobile)

## 2021-02-13 NOTE — Progress Notes (Signed)
Pt received Kanjinti and leucovorin infusion in clinic today. 5FU pump connected at d/c.

## 2021-02-14 LAB — CEA: CEA: 9.9 ng/mL — ABNORMAL HIGH (ref 0.0–4.7)

## 2021-02-15 ENCOUNTER — Inpatient Hospital Stay: Payer: Medicare Other

## 2021-02-15 ENCOUNTER — Ambulatory Visit
Admission: RE | Admit: 2021-02-15 | Discharge: 2021-02-15 | Disposition: A | Discharge status: Home / Self Care | Payer: Medicare Other | Source: Ambulatory Visit | Attending: Oncology | Admitting: Oncology

## 2021-02-15 ENCOUNTER — Other Ambulatory Visit: Payer: Self-pay

## 2021-02-15 VITALS — BP 129/68 | HR 66 | Temp 96.3°F | Resp 16

## 2021-02-15 DIAGNOSIS — C799 Secondary malignant neoplasm of unspecified site: Secondary | ICD-10-CM | POA: Insufficient documentation

## 2021-02-15 DIAGNOSIS — C159 Malignant neoplasm of esophagus, unspecified: Secondary | ICD-10-CM

## 2021-02-15 DIAGNOSIS — Z0189 Encounter for other specified special examinations: Secondary | ICD-10-CM

## 2021-02-15 DIAGNOSIS — Z5181 Encounter for therapeutic drug level monitoring: Secondary | ICD-10-CM | POA: Diagnosis not present

## 2021-02-15 DIAGNOSIS — Z5112 Encounter for antineoplastic immunotherapy: Secondary | ICD-10-CM | POA: Diagnosis not present

## 2021-02-15 DIAGNOSIS — C160 Malignant neoplasm of cardia: Secondary | ICD-10-CM | POA: Diagnosis not present

## 2021-02-15 DIAGNOSIS — Z79899 Other long term (current) drug therapy: Secondary | ICD-10-CM | POA: Diagnosis not present

## 2021-02-15 DIAGNOSIS — I517 Cardiomegaly: Secondary | ICD-10-CM | POA: Diagnosis not present

## 2021-02-15 LAB — ECHOCARDIOGRAM COMPLETE
AR max vel: 2.13 cm2
AV Area VTI: 2.5 cm2
AV Area mean vel: 2.31 cm2
AV Mean grad: 4 mmHg
AV Peak grad: 7.5 mmHg
Ao pk vel: 1.37 m/s
Area-P 1/2: 4.41 cm2
S' Lateral: 3.7 cm

## 2021-02-15 MED ORDER — HEPARIN SOD (PORK) LOCK FLUSH 100 UNIT/ML IV SOLN
500.0000 [IU] | Freq: Once | INTRAVENOUS | Status: AC | PRN
  Administered 2021-02-15: 500 [IU]
  Filled 2021-02-15: qty 5

## 2021-02-15 MED ORDER — HEPARIN SOD (PORK) LOCK FLUSH 100 UNIT/ML IV SOLN
INTRAVENOUS | Status: AC
  Filled 2021-02-15: qty 5

## 2021-02-15 MED ORDER — SODIUM CHLORIDE 0.9% FLUSH
10.0000 mL | INTRAVENOUS | Status: DC | PRN
  Administered 2021-02-15: 10 mL
  Filled 2021-02-15: qty 10

## 2021-02-15 MED ORDER — PEGFILGRASTIM-CBQV 6 MG/0.6ML ~~LOC~~ SOSY
6.0000 mg | PREFILLED_SYRINGE | Freq: Once | SUBCUTANEOUS | Status: AC
  Administered 2021-02-15: 6 mg via SUBCUTANEOUS
  Filled 2021-02-15: qty 0.6

## 2021-02-15 NOTE — Progress Notes (Signed)
*  PRELIMINARY RESULTS* Echocardiogram 2D Echocardiogram has been performed.  Cory [name] 02/15/2021, 10:31 AM

## 2021-02-15 NOTE — Patient Instructions (Addendum)
[facility]  Discharge Instructions: Thank you for choosing [facility] to provide your oncology and hematology care.  If you have a lab appointment with the [facility], please go directly to the [facility] and check in at the registration area.  Wear comfortable clothing and clothing appropriate for easy access to any Portacath or PICC line.   We strive to give you quality time with your provider. You may need to reschedule your appointment if you arrive late (15 or more minutes).  Arriving late affects you and other patients whose appointments are after yours.  Also, if you miss three or more appointments without notifying the office, you may be dismissed from the clinic at the provider's discretion.      For prescription refill requests, have your pharmacy contact our office and allow 72 hours for refills to be completed.    Today you received the following chemotherapy and/or immunotherapy agents UDENCYA      To help prevent nausea and vomiting after your treatment, we encourage you to take your nausea medication as directed.  BELOW ARE SYMPTOMS THAT SHOULD BE REPORTED IMMEDIATELY: *FEVER GREATER THAN 100.4 F (38 C) OR HIGHER *CHILLS OR SWEATING *NAUSEA AND VOMITING THAT IS NOT CONTROLLED WITH YOUR NAUSEA MEDICATION *UNUSUAL SHORTNESS OF BREATH *UNUSUAL BRUISING OR BLEEDING *URINARY PROBLEMS (pain or burning when urinating, or frequent urination) *BOWEL PROBLEMS (unusual diarrhea, constipation, pain near the anus) TENDERNESS IN MOUTH AND THROAT WITH OR WITHOUT PRESENCE OF ULCERS (sore throat, sores in mouth, or a toothache) UNUSUAL RASH, SWELLING OR PAIN  UNUSUAL VAGINAL DISCHARGE OR ITCHING   Items with * indicate a potential emergency and should be followed up as soon as possible or go to the Emergency Department if any problems should occur.  Please show the CHEMOTHERAPY ALERT CARD or IMMUNOTHERAPY ALERT CARD at check-in to  the Emergency Department and triage nurse.  Should you have questions after your visit or need to cancel or reschedule your appointment, please contact [facility]  [phone] and follow the prompts.  Office hours are 8:00 a.m. to 4:30 p.m. Monday - Friday. Please note that voicemails left after 4:00 p.m. may not be returned until the following business day.  We are closed weekends and major holidays. You have access to a nurse at all times for urgent questions. Please call the main number to the clinic [phone] and follow the prompts.  For any non-urgent questions, you may also contact your provider using MyChart. We now offer e-Visits for anyone 66 and older to request care online for non-urgent symptoms. For details visit [email or web address].   Also download the MyChart app! Go to the app store, search "MyChart", open the app, select [facility], and log in with your MyChart username and password.  Due to Covid, a mask is required upon entering the hospital/clinic. If you do not have a mask, one will be given to you upon arrival. For doctor visits, patients may have 1 support person aged 66 or older with them. For treatment visits, patients cannot have anyone with them due to current Covid guidelines and our immunocompromised population.   Pegfilgrastim injection What is this medication? PEGFILGRASTIM (PEG fil gra stim) is a long-acting granulocyte colony-stimulating factor that stimulates the growth of neutrophils, a type of white blood cell important in the body's fight against infection. It is used to reduce the incidence of fever and infection in patients with certain types of cancer who  are receiving chemotherapy that affects the bone marrow, and toincrease survival after being exposed to high doses of radiation. This medicine may be used for other purposes; ask your health care provider orpharmacist if you have questions. COMMON BRAND NAME(S):  Rexene Edison, Ziextenzo What should I tell my care team before I take this medication? They need to know if you have any of these conditions: kidney disease latex allergy ongoing radiation therapy sickle cell disease skin reactions to acrylic adhesives (On-Body Injector only) an unusual or allergic reaction to pegfilgrastim, filgrastim, other medicines, foods, dyes, or preservatives pregnant or trying to get pregnant breast-feeding How should I use this medication? This medicine is for injection under the skin. If you get this medicine at home, you will be taught how to prepare and give the pre-filled syringe or how to use the On-body Injector. Refer to the patient Instructions for Use for detailed instructions. Use exactly as directed. Tell your healthcare provider immediately if you suspect that the On-body Injector may not have performed as intended or if you suspect the use of the On-body Injector resulted in a missedor partial dose. It is important that you put your used needles and syringes in a special sharps container. Do not put them in a trash can. If you do not have a sharpscontainer, call your pharmacist or healthcare provider to get one. Talk to your pediatrician regarding the use of this medicine in children. Whilethis drug may be prescribed for selected conditions, precautions do apply. Overdosage: If you think you have taken too much of this medicine contact apoison control center or emergency room at once. NOTE: This medicine is only for you. Do not share this medicine with others. What if I miss a dose? It is important not to miss your dose. Call your doctor or health care professional if you miss your dose. If you miss a dose due to an On-body Injector failure or leakage, a new dose should be administered as soon aspossible using a single prefilled syringe for manual use. What may interact with this medication? Interactions have not been studied. This  list may not describe all possible interactions. Give your health care provider a list of all the medicines, herbs, non-prescription drugs, or dietary supplements you use. Also tell them if you smoke, drink alcohol, or use illegaldrugs. Some items may interact with your medicine. What should I watch for while using this medication? Your condition will be monitored carefully while you are receiving thismedicine. You may need blood work done while you are taking this medicine. Talk to your health care provider about your risk of cancer. You may be more atrisk for certain types of cancer if you take this medicine. If you are going to need a MRI, CT scan, or other procedure, tell your doctorthat you are using this medicine (On-Body Injector only). What side effects may I notice from receiving this medication? Side effects that you should report to your doctor or health care professionalas soon as possible: allergic reactions (skin rash, itching or hives, swelling of the face, lips, or tongue) back pain dizziness fever pain, redness, or irritation at site where injected pinpoint red spots on the skin red or dark-brown urine shortness of breath or breathing problems stomach or side pain, or pain at the shoulder swelling tiredness trouble passing urine or change in the amount of urine unusual bruising or bleeding Side effects that usually do not require medical attention (report to yourdoctor or health care professional if they  continue or are bothersome): bone pain muscle pain This list may not describe all possible side effects. Call your doctor for medical advice about side effects. You may report side effects to FDA at1-800-FDA-1088. Where should I keep my medication? Keep out of the reach of children. If you are using this medicine at home, you will be instructed on how to storeit. Throw away any unused medicine after the expiration date on the label. NOTE: This sheet is a summary. It may  not cover all possible information. If you have questions about this medicine, talk to your doctor, pharmacist, orhealth care provider.  2022 Elsevier/Gold Standard (2020-07-21 11:54:14)

## 2021-02-23 ENCOUNTER — Ambulatory Visit: Payer: Medicare Other | Admitting: Radiation Oncology

## 2021-02-27 ENCOUNTER — Other Ambulatory Visit: Payer: Self-pay

## 2021-02-27 ENCOUNTER — Inpatient Hospital Stay (HOSPITAL_BASED_OUTPATIENT_CLINIC_OR_DEPARTMENT_OTHER): Payer: Medicare Other | Admitting: Oncology

## 2021-02-27 ENCOUNTER — Inpatient Hospital Stay: Payer: Medicare Other

## 2021-02-27 ENCOUNTER — Encounter: Payer: Self-pay | Admitting: Oncology

## 2021-02-27 VITALS — BP 132/62 | HR 71 | Temp 97.7°F | Resp 16 | Ht 69.75 in | Wt 198.7 lb

## 2021-02-27 DIAGNOSIS — C159 Malignant neoplasm of esophagus, unspecified: Secondary | ICD-10-CM

## 2021-02-27 DIAGNOSIS — Z5111 Encounter for antineoplastic chemotherapy: Secondary | ICD-10-CM

## 2021-02-27 DIAGNOSIS — Z5112 Encounter for antineoplastic immunotherapy: Secondary | ICD-10-CM

## 2021-02-27 LAB — COMPREHENSIVE METABOLIC PANEL
ALT: 23 U/L (ref 0–44)
AST: 25 U/L (ref 15–41)
Albumin: 3.9 g/dL (ref 3.5–5.0)
Alkaline Phosphatase: 135 U/L — ABNORMAL HIGH (ref 38–126)
Anion gap: 8 (ref 5–15)
BUN: 17 mg/dL (ref 8–23)
CO2: 23 mmol/L (ref 22–32)
Calcium: 9 mg/dL (ref 8.9–10.3)
Chloride: 105 mmol/L (ref 98–111)
Creatinine, Ser: 0.98 mg/dL (ref 0.61–1.24)
GFR, Estimated: >60 mL/min (ref >60)
Glucose, Bld: 134 mg/dL — ABNORMAL HIGH (ref 70–99)
Potassium: 4 mmol/L (ref 3.5–5.1)
Sodium: 136 mmol/L (ref 135–145)
Total Bilirubin: 0.9 mg/dL (ref 0.3–1.2)
Total Protein: 7 g/dL (ref 6.5–8.1)

## 2021-02-27 LAB — CBC WITH DIFFERENTIAL/PLATELET
Abs Immature Granulocytes: 0.07 10*3/uL (ref 0.00–0.07)
Basophils Absolute: 0 10*3/uL (ref 0.0–0.1)
Basophils Relative: 1 %
Eosinophils Absolute: 0.3 10*3/uL (ref 0.0–0.5)
Eosinophils Relative: 4 %
HCT: 39.2 % (ref 39.0–52.0)
Hemoglobin: 12.5 g/dL — ABNORMAL LOW (ref 13.0–17.0)
Immature Granulocytes: 1 %
Lymphocytes Relative: 13 %
Lymphs Abs: 1.1 10*3/uL (ref 0.7–4.0)
MCH: 30.4 pg (ref 26.0–34.0)
MCHC: 31.9 g/dL (ref 30.0–36.0)
MCV: 95.4 fL (ref 80.0–100.0)
Monocytes Absolute: 0.6 10*3/uL (ref 0.1–1.0)
Monocytes Relative: 7 %
Neutro Abs: 6.4 10*3/uL (ref 1.7–7.7)
Neutrophils Relative %: 74 %
Platelets: 104 10*3/uL — ABNORMAL LOW (ref 150–400)
RBC: 4.11 MIL/uL — ABNORMAL LOW (ref 4.22–5.81)
RDW: 15.6 % — ABNORMAL HIGH (ref 11.5–15.5)
WBC: 8.6 10*3/uL (ref 4.0–10.5)
nRBC: 0 % (ref 0.0–0.2)

## 2021-02-27 MED ORDER — ACETAMINOPHEN 325 MG PO TABS
650.0000 mg | ORAL_TABLET | Freq: Once | ORAL | Status: AC
  Administered 2021-02-27: 650 mg via ORAL
  Filled 2021-02-27: qty 2

## 2021-02-27 MED ORDER — SODIUM CHLORIDE 0.9 % IV SOLN
5000.0000 mg | INTRAVENOUS | Status: DC
  Administered 2021-02-27: 5000 mg via INTRAVENOUS
  Filled 2021-02-27: qty 100

## 2021-02-27 MED ORDER — TRASTUZUMAB-ANNS CHEMO 150 MG IV SOLR
4.0000 mg/kg | Freq: Once | INTRAVENOUS | Status: AC
  Administered 2021-02-27: 357 mg via INTRAVENOUS
  Filled 2021-02-27: qty 17

## 2021-02-27 MED ORDER — LEUCOVORIN CALCIUM INJECTION 350 MG
850.0000 mg | Freq: Once | INTRAVENOUS | Status: AC
  Administered 2021-02-27: 850 mg via INTRAVENOUS
  Filled 2021-02-27: qty 42.5

## 2021-02-27 MED ORDER — SODIUM CHLORIDE 0.9 % IV SOLN
10.0000 mg | Freq: Once | INTRAVENOUS | Status: AC
  Administered 2021-02-27: 10 mg via INTRAVENOUS
  Filled 2021-02-27: qty 10

## 2021-02-27 MED ORDER — SODIUM CHLORIDE 0.9 % IV SOLN
Freq: Once | INTRAVENOUS | Status: AC
  Filled 2021-02-27: qty 250

## 2021-02-27 MED ORDER — DEXTROSE 5 % IV SOLN
Freq: Once | INTRAVENOUS | Status: AC
Start: 2021-02-27 — End: 2021-02-27
  Filled 2021-02-27: qty 250

## 2021-02-27 MED ORDER — PALONOSETRON HCL INJECTION 0.25 MG/5ML
0.2500 mg | Freq: Once | INTRAVENOUS | Status: AC
  Administered 2021-02-27: 0.25 mg via INTRAVENOUS
  Filled 2021-02-27: qty 5

## 2021-02-27 MED ORDER — SODIUM CHLORIDE 0.9% FLUSH
10.0000 mL | Freq: Once | INTRAVENOUS | Status: AC
  Administered 2021-02-27: 10 mL via INTRAVENOUS
  Filled 2021-02-27: qty 10

## 2021-02-27 MED ORDER — FLUOROURACIL CHEMO INJECTION 2.5 GM/50ML
400.0000 mg/m2 | Freq: Once | INTRAVENOUS | Status: AC
  Administered 2021-02-27: 850 mg via INTRAVENOUS
  Filled 2021-02-27: qty 17

## 2021-02-27 MED ORDER — SODIUM CHLORIDE 0.9% FLUSH
10.0000 mL | INTRAVENOUS | Status: DC | PRN
  Administered 2021-02-27: 10 mL
  Filled 2021-02-27: qty 10

## 2021-02-27 MED ORDER — DIPHENHYDRAMINE HCL 50 MG/ML IJ SOLN
50.0000 mg | Freq: Once | INTRAMUSCULAR | Status: AC
  Administered 2021-02-27: 50 mg via INTRAVENOUS
  Filled 2021-02-27: qty 1

## 2021-02-27 NOTE — Patient Instructions (Signed)
[facility]  Discharge Instructions: Thank you for choosing [facility] to provide your oncology and hematology care.  If you have a lab appointment with the [facility], please go directly to the [facility] and check in at the registration area.  Wear comfortable clothing and clothing appropriate for easy access to any Portacath or PICC line.   We strive to give you quality time with your provider. You may need to reschedule your appointment if you arrive late (15 or more minutes).  Arriving late affects you and other patients whose appointments are after yours.  Also, if you miss three or more appointments without notifying the office, you may be dismissed from the clinic at the provider's discretion.      For prescription refill requests, have your pharmacy contact our office and allow 72 hours for refills to be completed.    Today you received the following chemotherapy and/or immunotherapy agents - trastuzumab, fluorouracil      To help prevent nausea and vomiting after your treatment, we encourage you to take your nausea medication as directed.  BELOW ARE SYMPTOMS THAT SHOULD BE REPORTED IMMEDIATELY: *FEVER GREATER THAN 100.4 F (38 C) OR HIGHER *CHILLS OR SWEATING *NAUSEA AND VOMITING THAT IS NOT CONTROLLED WITH YOUR NAUSEA MEDICATION *UNUSUAL SHORTNESS OF BREATH *UNUSUAL BRUISING OR BLEEDING *URINARY PROBLEMS (pain or burning when urinating, or frequent urination) *BOWEL PROBLEMS (unusual diarrhea, constipation, pain near the anus) TENDERNESS IN MOUTH AND THROAT WITH OR WITHOUT PRESENCE OF ULCERS (sore throat, sores in mouth, or a toothache) UNUSUAL RASH, SWELLING OR PAIN  UNUSUAL VAGINAL DISCHARGE OR ITCHING   Items with * indicate a potential emergency and should be followed up as soon as possible or go to the Emergency Department if any problems should occur.  Please show the CHEMOTHERAPY ALERT CARD or IMMUNOTHERAPY ALERT  CARD at check-in to the Emergency Department and triage nurse.  Should you have questions after your visit or need to cancel or reschedule your appointment, please contact [facility]  [phone] and follow the prompts.  Office hours are 8:00 a.m. to 4:30 p.m. Monday - Friday. Please note that voicemails left after 4:00 p.m. may not be returned until the following business day.  We are closed weekends and major holidays. You have access to a nurse at all times for urgent questions. Please call the main number to the clinic [phone] and follow the prompts.  For any non-urgent questions, you may also contact your provider using MyChart. We now offer e-Visits for anyone 59 and older to request care online for non-urgent symptoms. For details visit [email or web address].   Also download the MyChart app! Go to the app store, search "MyChart", open the app, select [facility], and log in with your MyChart username and password.  Due to Covid, a mask is required upon entering the hospital/clinic. If you do not have a mask, one will be given to you upon arrival. For doctor visits, patients may have 1 support person aged 87 or older with them. For treatment visits, patients cannot have anyone with them due to current Covid guidelines and our immunocompromised population.   Trastuzumab injection for infusion What is this medication? TRASTUZUMAB (tras TOO zoo mab) is a monoclonal antibody. It is used to treatbreast cancer and stomach cancer. This medicine may be used for other purposes; ask your health care provider orpharmacist if you have questions. COMMON BRAND NAME(S): Herceptin, Belenda Cruise, Ogivri, Chalmers Guest  What should I tell my care team before I take this medication? They need to know if you have any of these conditions: heart disease heart failure lung or breathing disease, like asthma an unusual or allergic reaction to trastuzumab,  benzyl alcohol, or other medications, foods, dyes, or preservatives pregnant or trying to get pregnant breast-feeding How should I use this medication? This drug is given as an infusion into a vein. It is administered in a hospitalor clinic by a specially trained health care professional. Talk to your pediatrician regarding the use of this medicine in children. Thismedicine is not approved for use in children. Overdosage: If you think you have taken too much of this medicine contact apoison control center or emergency room at once. NOTE: This medicine is only for you. Do not share this medicine with others. What if I miss a dose? It is important not to miss a dose. Call your doctor or health careprofessional if you are unable to keep an appointment. What may interact with this medication? This medicine may interact with the following medications: certain types of chemotherapy, such as daunorubicin, doxorubicin, epirubicin, and idarubicin This list may not describe all possible interactions. Give your health care provider a list of all the medicines, herbs, non-prescription drugs, or dietary supplements you use. Also tell them if you smoke, drink alcohol, or use illegaldrugs. Some items may interact with your medicine. What should I watch for while using this medication? Visit your doctor for checks on your progress. Report any side effects. Continue your course of treatment even though you feel ill unless your doctortells you to stop. Call your doctor or health care professional for advice if you get a fever, chills or sore throat, or other symptoms of a cold or flu. Do not treatyourself. Try to avoid being around people who are sick. You may experience fever, chills and shaking during your first infusion. These effects are usually mild and can be treated with other medicines. Report any side effects during the infusion to your health care professional. Fever andchills usually do not happen with  later infusions. Do not become pregnant while taking this medicine or for 7 months after stopping it. Women should inform their doctor if they wish to become pregnant or think they might be pregnant. Women of child-bearing potential will need to have a negative pregnancy test before starting this medicine. There is a potential for serious side effects to an unborn child. Talk to your health care professional or pharmacist for more information. Do not breast-feed an infantwhile taking this medicine or for 7 months after stopping it. Women must use effective birth control with this medicine. What side effects may I notice from receiving this medication? Side effects that you should report to your doctor or health care professionalas soon as possible: allergic reactions like skin rash, itching or hives, swelling of the face, lips, or tongue chest pain or palpitations cough dizziness feeling faint or lightheaded, falls fever general ill feeling or flu-like symptoms signs of worsening heart failure like breathing problems; swelling in your legs and feet unusually weak or tired Side effects that usually do not require medical attention (report to yourdoctor or health care professional if they continue or are bothersome): bone pain changes in taste diarrhea joint pain nausea/vomiting weight loss This list may not describe all possible side effects. Call your doctor for medical advice about side effects. You may report side effects to FDA at1-800-FDA-1088. Where should I keep my medication? This drug is   given in a hospital or clinic and will not be stored at home. NOTE: This sheet is a summary. It may not cover all possible information. If you have questions about this medicine, talk to your doctor, pharmacist, orhealth care provider.  2022 Elsevier/Gold Standard (2016-06-18 14:37:52)   Fluorouracil, 5-FU injection What is this medication? FLUOROURACIL, 5-FU (flure oh YOOR a sil) is a  chemotherapy drug. It slows the growth of cancer cells. This medicine is used to treat many types of cancer like breast cancer, colon or rectal cancer, pancreatic cancer, and stomachcancer. This medicine may be used for other purposes; ask your health care provider orpharmacist if you have questions. COMMON BRAND NAME(S): Adrucil What should I tell my care team before I take this medication? They need to know if you have any of these conditions: blood disorders dihydropyrimidine dehydrogenase (DPD) deficiency infection (especially a virus infection such as chickenpox, cold sores, or herpes) kidney disease liver disease malnourished, poor nutrition recent or ongoing radiation therapy an unusual or allergic reaction to fluorouracil, other chemotherapy, other medicines, foods, dyes, or preservatives pregnant or trying to get pregnant breast-feeding How should I use this medication? This drug is given as an infusion or injection into a vein. It is administeredin a hospital or clinic by a specially trained health care professional. Talk to your pediatrician regarding the use of this medicine in children.Special care may be needed. Overdosage: If you think you have taken too much of this medicine contact apoison control center or emergency room at once. NOTE: This medicine is only for you. Do not share this medicine with others. What if I miss a dose? It is important not to miss your dose. Call your doctor or health careprofessional if you are unable to keep an appointment. What may interact with this medication? Do not take this medicine with any of the following medications: live virus vaccines This medicine may also interact with the following medications: medicines that treat or prevent blood clots like warfarin, enoxaparin, and dalteparin This list may not describe all possible interactions. Give your health care provider a list of all the medicines, herbs, non-prescription drugs, or  dietary supplements you use. Also tell them if you smoke, drink alcohol, or use illegaldrugs. Some items may interact with your medicine. What should I watch for while using this medication? Visit your doctor for checks on your progress. This drug may make you feel generally unwell. This is not uncommon, as chemotherapy can affect healthy cells as well as cancer cells. Report any side effects. Continue your course oftreatment even though you feel ill unless your doctor tells you to stop. In some cases, you may be given additional medicines to help with side effects.Follow all directions for their use. Call your doctor or health care professional for advice if you get a fever, chills or sore throat, or other symptoms of a cold or flu. Do not treat yourself. This drug decreases your body's ability to fight infections. Try toavoid being around people who are sick. This medicine may increase your risk to bruise or bleed. Call your doctor orhealth care professional if you notice any unusual bleeding. Be careful brushing and flossing your teeth or using a toothpick because you may get an infection or bleed more easily. If you have any dental work done,tell your dentist you are receiving this medicine. Avoid taking products that contain aspirin, acetaminophen, ibuprofen, naproxen, or ketoprofen unless instructed by your doctor. These medicines may hide afever. Do not become pregnant while  taking this medicine. Women should inform their doctor if they wish to become pregnant or think they might be pregnant. There is a potential for serious side effects to an unborn child. Talk to your health care professional or pharmacist for more information. Do not breast-feed aninfant while taking this medicine. Men should inform their doctor if they wish to father a child. This medicinemay lower sperm counts. Do not treat diarrhea with over the counter products. Contact your doctor ifyou have diarrhea that lasts more than 2  days or if it is severe and watery. This medicine can make you more sensitive to the sun. Keep out of the sun. If you cannot avoid being in the sun, wear protective clothing and use sunscreen.Do not use sun lamps or tanning beds/booths. What side effects may I notice from receiving this medication? Side effects that you should report to your doctor or health care professionalas soon as possible: allergic reactions like skin rash, itching or hives, swelling of the face, lips, or tongue low blood counts - this medicine may decrease the number of white blood cells, red blood cells and platelets. You may be at increased risk for infections and bleeding. signs of infection - fever or chills, cough, sore throat, pain or difficulty passing urine signs of decreased platelets or bleeding - bruising, pinpoint red spots on the skin, black, tarry stools, blood in the urine signs of decreased red blood cells - unusually weak or tired, fainting spells, lightheadedness breathing problems changes in vision chest pain mouth sores nausea and vomiting pain, swelling, redness at site where injected pain, tingling, numbness in the hands or feet redness, swelling, or sores on hands or feet stomach pain unusual bleeding Side effects that usually do not require medical attention (report to yourdoctor or health care professional if they continue or are bothersome): changes in finger or toe nails diarrhea dry or itchy skin hair loss headache loss of appetite sensitivity of eyes to the light stomach upset unusually teary eyes This list may not describe all possible side effects. Call your doctor for medical advice about side effects. You may report side effects to FDA at1-800-FDA-1088. Where should I keep my medication? This drug is given in a hospital or clinic and will not be stored at home. NOTE: This sheet is a summary. It may not cover all possible information. If you have questions about this medicine,  talk to your doctor, pharmacist, orhealth care provider.  2022 Elsevier/Gold Standard (2019-05-25 15:00:03)

## 2021-02-27 NOTE — Progress Notes (Signed)
I connected with Cory [name] on 02/27/21 at  8:30 AM EDT by video enabled telemedicine visit and verified that I am speaking with the correct person using two identifiers.   I discussed the limitations, risks, security and privacy concerns of performing an evaluation and management service by telemedicine and the availability of in-person appointments. I also discussed with the patient that there may be a patient responsible charge related to this service. The patient expressed understanding and agreed to proceed.  Other persons participating in the visit and their role in the encounter:  patients daughter  Patient's location:  cancer center Provider's location:  home  Chief Complaint:  on treatment assessment prior to next cycle of 5-FU Keytruda chemotherapy  History of present illness: Patient is a 65 year old male who presented with symptoms of indigestion and heartburn which gradually progressed to dysphagia.  He underwent EGD on 06/20/2020 By Dr. Tobi Bastos which showed a large nearly obstructing mass in the lower third of the esophagus 35 cm from incisors.  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  PD-l1 <1 %, TMB HIGH    Presently patient reports dysphagia but is able to eat cereal or soft food.  He has lost about 13 pounds in the last month itself.  Denies any significant pain.  He recently retired after many years of service and is otherwise independent of his ADLs and IADLs.     PET CT scan on 06/26/2020 showed circumferential distal esophageal mass with an SUV of 13.3 extending over 5.5 cm. Right lower paratracheal node is 0.6 cm with an SUV of 3.1. Hypermetabolic right gastric lymph nodes including 1.1 cm node with an SUV of 5.8. Numerous hypermetabolic lesions throughout the liver somewhat confluent around the periphery which were hypermetabolic between 8 and 9. Faintly hypermetabolic left adrenal gland   NGS testing showed high tumor mutational burden CPS  score less than 1. CD9-NRG1 fusion. BRAF 581L, CCN E1 gain.  EMLA for fusion of BX [id number], FGF 23 gain, FGF 6 gain, MET R988C, T p53 S1 71F. ERBB2 gain but no other actionable mutations   Patient currently on first-line FOLFOX/trastuzumab/Keytruda.  He gets Keytruda every 6 weeks  Interval history patient has erythematous skin rash over his bilateral forearms for which she sees dermatology and has been attributed to psoriasis.  He uses a topical cream for the same.Overall he reports feeling well.  Denies any difficulty swallowing.  Overall tolerating treatment well without any significant side effects   Review of Systems  Constitutional:  Negative for chills, fever, malaise/fatigue and weight loss.  HENT:  Negative for congestion, ear discharge and nosebleeds.   Eyes:  Negative for blurred vision.  Respiratory:  Negative for cough, hemoptysis, sputum production, shortness of breath and wheezing.   Cardiovascular:  Negative for chest pain, palpitations, orthopnea and claudication.  Gastrointestinal:  Negative for abdominal pain, blood in stool, constipation, diarrhea, heartburn, melena, nausea and vomiting.  Genitourinary:  Negative for dysuria, flank pain, frequency, hematuria and urgency.  Musculoskeletal:  Negative for back pain, joint pain and myalgias.  Skin:  Positive for rash.  Neurological:  Negative for dizziness, tingling, focal weakness, seizures, weakness and headaches.  Endo/Heme/Allergies:  Does not bruise/bleed easily.  Psychiatric/Behavioral:  Negative for depression and suicidal ideas. The patient does not have insomnia.    No Known Allergies  Past Medical History:  Diagnosis Date   Esophageal adenocarcinoma (HCC) 06/23/2020   Family history of brain cancer    Family history of  colon cancer    Family history of melanoma    Family history of stomach cancer    GERD (gastroesophageal reflux disease)    History of kidney stones    Nephrolithiasis    [facility]    Pancreatitis, acute    Personal history of colonic polyps    Dr Maryruth Bun   Psoriasis     Past Surgical History:  Procedure Laterality Date   COLONOSCOPY     COLONOSCOPY WITH PROPOFOL N/A 03/31/2020   Procedure: COLONOSCOPY WITH PROPOFOL;  Surgeon: Wyline Mood, MD;  Location: [facility] ENDOSCOPY;  Service: Gastroenterology;  Laterality: N/A;   ERCP     ESOPHAGOGASTRODUODENOSCOPY (EGD) WITH PROPOFOL N/A 06/19/2020   Procedure: ESOPHAGOGASTRODUODENOSCOPY (EGD) WITH PROPOFOL;  Surgeon: Wyline Mood, MD;  Location: [facility] ENDOSCOPY;  Service: Gastroenterology;  Laterality: N/A;   groin surgery     as a child   PORTA CATH INSERTION N/A 06/28/2020   Procedure: PORTA CATH INSERTION;  Surgeon: Annice Needy, MD;  Location: [facility] INVASIVE CV LAB;  Service: Cardiovascular;  Laterality: N/A;   SHOULDER SURGERY      Social History   Socioeconomic History   Marital status: Married    Spouse name: Not on file   Number of children: 2   Years of education: Not on file   Highest education level: Not on file  Occupational History   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  Tobacco Use   Smoking status: Former    Types: Cigarettes    Quit date: 07/08/1988    Years since quitting: 32.6   Smokeless tobacco: Never  Vaping Use   Vaping Use: Never used  Substance and Sexual Activity   Alcohol use: No    Comment: recovering alcoholic for 13 years   Drug use: No   Sexual activity: Not Currently  Other Topics Concern   Not on file  Social History Narrative   Has living will--needs to get notarized   Daughter should be health care POA (wife with dementia)   Would accept resuscitation   Would accept tube feeds depending on the circumstance   Social Determinants of Health   Financial Resource Strain: Not on file  Food Insecurity: Not on file  Transportation Needs: Not on file  Physical Activity: Not on file  Stress: Not on file  Social Connections: Not on file  Intimate Partner  Violence: Not on file    Family History  Problem Relation Age of Onset   Stomach cancer Mother    Diabetes Brother    Hypertension Brother    Hypertension Father    Prostate cancer Father    Colon cancer Sister    Cancer Other        either cervical or ovarian, d. 59s   Brain cancer Sister      Current Outpatient Medications:    dexamethasone (DECADRON) 4 MG tablet, Take 2 tablets (8 mg total) by mouth daily. Start the day after chemotherapy for 2 days. Take with food., Disp: 30 tablet, Rfl: 1   lidocaine-prilocaine (EMLA) cream, Apply 1 application topically as needed. Apply small amount to port site at least 1 hour prior to it being accessed, cover with plastic wrap, Disp: 30 g, Rfl: 3   oxyCODONE (OXY IR/ROXICODONE) 5 MG immediate release tablet, Take 1 tablet (5 mg total) by mouth every 8 (eight) hours as needed for severe pain., Disp: 60 tablet, Rfl: 0   pantoprazole (PROTONIX) 40 MG tablet, TAKE 1 TABLET BY  MOUTH TWICE A DAY BEFORE MEALS, Disp: 60 tablet, Rfl: 3   triamcinolone cream (KENALOG) 0.1 %, Apply 1 application topically 3 (three) times daily as needed., Disp: 300 each, Rfl: 1   LORazepam (ATIVAN) 0.5 MG tablet, Take 1 tablet (0.5 mg total) by mouth every 6 (six) hours as needed (Nausea or vomiting). (Patient not taking: Reported on 02/27/2021), Disp: 30 tablet, Rfl: 0   ondansetron (ZOFRAN) 8 MG tablet, Take 1 tablet (8 mg total) by mouth 2 (two) times daily as needed for refractory nausea / vomiting. Start on day 3 after chemotherapy. (Patient not taking: Reported on 02/27/2021), Disp: 30 tablet, Rfl: 1   prochlorperazine (COMPAZINE) 10 MG tablet, Take 1 tablet (10 mg total) by mouth every 6 (six) hours as needed (Nausea or vomiting). (Patient not taking: Reported on 02/27/2021), Disp: 30 tablet, Rfl: 1 No current facility-administered medications for this visit.  Facility-Administered Medications Ordered in Other Visits:    fluorouracil (ADRUCIL) 5,000 mg in sodium  chloride 0.9 % 150 mL chemo infusion, 5,000 mg, Intravenous, 1 day or 1 dose, Creig Hines, MD, 5,000 mg at 02/27/21 1135   sodium chloride flush (NS) 0.9 % injection 10 mL, 10 mL, Intracatheter, PRN, Creig Hines, MD, 10 mL at 01/04/21 1149   sodium chloride flush (NS) 0.9 % injection 10 mL, 10 mL, Intracatheter, PRN, Creig Hines, MD, 10 mL at 02/27/21 4259  ECHOCARDIOGRAM COMPLETE  Result Date: 02/15/2021    ECHOCARDIOGRAM REPORT   Patient Name:   EDO GRZYB Date of Exam: 02/15/2021 Medical Rec #:  [id number]      Height:       69.8 in Accession #:    [id number]     Weight:       194.2 lb Date of Birth:  [birth year]     BSA:          2.056 m Patient Age:    64 years       BP:           134/57 mmHg Patient Gender: M              HR:           72 bpm. Exam Location:  [facility] Procedure: 2D Echo, Color Doppler, Cardiac Doppler and Strain Analysis Indications:     C15.9 Esophageal adenocarcinoma                  C79.9 Metastatic HER2 positive neoplasm of gastroesophageal                  function                  Z51.12 encounter for antineoplastic immunotherapy  History:         Patient has prior history of Echocardiogram examinations, most                  recent 10/16/2020.  Sonographer:     Cristela Blue RDCS (AE) Referring Phys:  [id number] Pete Glatter Reeder Brisby Diagnosing Phys: Lorine Bears MD  Sonographer Comments: Global longitudinal strain was attempted. IMPRESSIONS  1. Left ventricular ejection fraction, by estimation, is 55 to 60%. The left ventricle has normal function. The left ventricle has no regional wall motion abnormalities. There is mild left ventricular hypertrophy. Left ventricular diastolic parameters were normal. The average left ventricular global longitudinal strain is -16.6 %. The global longitudinal strain is normal.  2. Right ventricular systolic function is normal. The right  ventricular size is normal. There is normal pulmonary artery systolic pressure.  3. Left atrial size was mildly  dilated.  4. The mitral valve is normal in structure. Mild mitral valve regurgitation. No evidence of mitral stenosis.  5. The aortic valve is normal in structure. Aortic valve regurgitation is not visualized. No aortic stenosis is present.  6. The inferior vena cava is normal in size with greater than 50% respiratory variability, suggesting right atrial pressure of 3 mmHg. FINDINGS  Left Ventricle: Left ventricular ejection fraction, by estimation, is 55 to 60%. The left ventricle has normal function. The left ventricle has no regional wall motion abnormalities. The average left ventricular global longitudinal strain is -16.6 %. The global longitudinal strain is normal. Global longitudinal strain performed but not reported based on interpreter judgement due to suboptimal tracking. The left ventricular internal cavity size was normal in size. There is mild left ventricular hypertrophy. Left ventricular diastolic parameters were normal. Right Ventricle: The right ventricular size is normal. No increase in right ventricular wall thickness. Right ventricular systolic function is normal. There is normal pulmonary artery systolic pressure. The tricuspid regurgitant velocity is 2.09 m/s, and  with an assumed right atrial pressure of 3 mmHg, the estimated right ventricular systolic pressure is 20.5 mmHg. Left Atrium: Left atrial size was mildly dilated. Right Atrium: Right atrial size was normal in size. Pericardium: There is no evidence of pericardial effusion. Mitral Valve: The mitral valve is normal in structure. Mild mitral valve regurgitation. No evidence of mitral valve stenosis. Tricuspid Valve: The tricuspid valve is normal in structure. Tricuspid valve regurgitation is not demonstrated. No evidence of tricuspid stenosis. Aortic Valve: The aortic valve is normal in structure. Aortic valve regurgitation is not visualized. No aortic stenosis is present. Aortic valve mean gradient measures 4.0 mmHg. Aortic valve peak  gradient measures 7.5 mmHg. Aortic valve area, by VTI measures 2.50 cm. Pulmonic Valve: The pulmonic valve was normal in structure. Pulmonic valve regurgitation is not visualized. No evidence of pulmonic stenosis. Aorta: The aortic root is normal in size and structure. Venous: The inferior vena cava is normal in size with greater than 50% respiratory variability, suggesting right atrial pressure of 3 mmHg. IAS/Shunts: No atrial level shunt detected by color flow Doppler.  LEFT VENTRICLE PLAX 2D LVIDd:         5.10 cm  Diastology LVIDs:         3.70 cm  LV e' medial:    8.05 cm/s LV PW:         1.20 cm  LV E/e' medial:  13.2 LV IVS:        1.00 cm  LV e' lateral:   6.85 cm/s LVOT diam:     2.00 cm  LV E/e' lateral: 15.5 LV SV:         64 LV SV Index:   31       2D Longitudinal Strain LVOT Area:     3.14 cm 2D Strain GLS Avg:     -16.6 %                          3D Volume EF:                         3D EF:        61 %  LV EDV:       210 ml                         LV ESV:       82 ml                         LV SV:        128 ml RIGHT VENTRICLE RV Basal diam:  2.50 cm RV S prime:     16.50 cm/s TAPSE (M-mode): 5.5 cm LEFT ATRIUM           Index       RIGHT ATRIUM           Index LA diam:      3.50 cm 1.70 cm/m  RA Area:     22.20 cm LA Vol (A2C): 77.0 ml 37.45 ml/m RA Volume:   64.90 ml  31.56 ml/m LA Vol (A4C): 39.5 ml 19.21 ml/m  AORTIC VALVE                   PULMONIC VALVE AV Area (Vmax):    2.13 cm    PV Vmax:        0.98 m/s AV Area (Vmean):   2.31 cm    PV Peak grad:   3.9 mmHg AV Area (VTI):     2.50 cm    RVOT Peak grad: 7 mmHg AV Vmax:           137.00 cm/s AV Vmean:          92.850 cm/s AV VTI:            0.255 m AV Peak Grad:      7.5 mmHg AV Mean Grad:      4.0 mmHg LVOT Vmax:         92.70 cm/s LVOT Vmean:        68.200 cm/s LVOT VTI:          0.203 m LVOT/AV VTI ratio: 0.80  AORTA Ao Root diam: 2.80 cm MITRAL VALVE                TRICUSPID VALVE MV Area (PHT): 4.41 cm      TR Peak grad:   17.5 mmHg MV Decel Time: 172 msec     TR Vmax:        209.00 cm/s MV E velocity: 106.00 cm/s MV A velocity: 84.80 cm/s   SHUNTS MV E/A ratio:  1.25         Systemic VTI:  0.20 m                             Systemic Diam: 2.00 cm Lorine Bears MD Electronically signed by Lorine Bears MD Signature Date/Time: 02/15/2021/11:31:21 AM    Final     No images are attached to the encounter.   CMP Latest Ref Rng & Units 02/27/2021  Glucose 70 - 99 mg/dL 644(I)  BUN 8 - 23 mg/dL 17  Creatinine 3.47 - 4.25 mg/dL 9.56  Sodium 387 - 564 mmol/L 136  Potassium 3.5 - 5.1 mmol/L 4.0  Chloride 98 - 111 mmol/L 105  CO2 22 - 32 mmol/L 23  Calcium 8.9 - 10.3 mg/dL 9.0  Total Protein 6.5 - 8.1 g/dL 7.0  Total Bilirubin 0.3 - 1.2 mg/dL 0.9  Alkaline Phos 38 - 126 U/L 135(H)  AST 15 - 41 U/L 25  ALT 0 - 44 U/L 23   CBC Latest Ref Rng & Units 02/27/2021  WBC 4.0 - 10.5 K/uL 8.6  Hemoglobin 13.0 - 17.0 g/dL 12.5(L)  Hematocrit 39.0 - 52.0 % 39.2  Platelets 150 - 400 K/uL 104(L)     Observation/objective: Appears in no acute distress over video visit today.  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  Overall tolerating treatment well without any significant side effects.  I will see him back in 2 weeks for cycle 19.  Recent echocardiogram continues to show normal heart functions.  He would be due for repeat scans sometime in October 2022.  Chemo induced thrombocytopenia: Fluctuates between 100-200.  Continue to monitor  Follow-up instructions: As above  I discussed the assessment and treatment plan with the patient. The patient was provided an opportunity to ask  questions and all were answered. The patient agreed with the plan and demonstrated an understanding of the instructions.   The patient was advised to call back or seek an in-person evaluation if the symptoms worsen or if the condition fails to improve as anticipated.  Visit Diagnosis: 1. Encounter for antineoplastic chemotherapy   2. Encounter for monoclonal antibody treatment for malignancy   3. Esophageal adenocarcinoma (HCC)     Dr. Owens Shark, MD, MPH [facility] at [facility] Tel- [phone] 02/27/2021 5:00 PM

## 2021-02-27 NOTE — Progress Notes (Signed)
No concerns. 

## 2021-02-28 ENCOUNTER — Emergency Department
Admission: EM | Admit: 2021-02-28 | Discharge: 2021-02-28 | Disposition: A | Discharge status: Home / Self Care | Payer: Medicare Other | Attending: Emergency Medicine | Admitting: Emergency Medicine

## 2021-02-28 ENCOUNTER — Emergency Department: Payer: Medicare Other

## 2021-02-28 ENCOUNTER — Other Ambulatory Visit: Payer: Self-pay

## 2021-02-28 ENCOUNTER — Encounter: Payer: Self-pay | Admitting: *Deleted

## 2021-02-28 DIAGNOSIS — S4991XA Unspecified injury of right shoulder and upper arm, initial encounter: Secondary | ICD-10-CM | POA: Diagnosis present

## 2021-02-28 DIAGNOSIS — S42294A Other nondisplaced fracture of upper end of right humerus, initial encounter for closed fracture: Secondary | ICD-10-CM

## 2021-02-28 DIAGNOSIS — Z87891 Personal history of nicotine dependence: Secondary | ICD-10-CM | POA: Diagnosis not present

## 2021-02-28 DIAGNOSIS — X501XXA Overexertion from prolonged static or awkward postures, initial encounter: Secondary | ICD-10-CM | POA: Diagnosis not present

## 2021-02-28 LAB — CEA: CEA: 11 ng/mL — ABNORMAL HIGH (ref 0.0–4.7)

## 2021-02-28 MED ORDER — MORPHINE SULFATE (PF) 4 MG/ML IV SOLN
4.0000 mg | Freq: Once | INTRAVENOUS | Status: AC
  Administered 2021-02-28: 4 mg via INTRAVENOUS
  Filled 2021-02-28: qty 1

## 2021-02-28 MED ORDER — ONDANSETRON HCL 4 MG/2ML IJ SOLN
4.0000 mg | Freq: Once | INTRAMUSCULAR | Status: AC
  Administered 2021-02-28: 4 mg via INTRAVENOUS
  Filled 2021-02-28: qty 2

## 2021-02-28 MED ORDER — OXYCODONE-ACETAMINOPHEN 5-325 MG PO TABS: 1.0000 | ORAL_TABLET | Freq: Four times a day (QID) | ORAL | 0 refills | Status: DC | PRN

## 2021-02-28 NOTE — ED Provider Notes (Signed)
[facility] Emergency Department Provider Note  ____________________________________________  Time seen: Approximately 8:57 PM  I have reviewed the triage vital signs and the nursing notes.   HISTORY  Chief Complaint Shoulder Injury    HPI Cory [name] is a 65 y.o. male who presents the emergency department complaining of right shoulder pain/injury.  Patient states that he was trying to move one of his utility trailers at home when his shoulder "gave out".  Patient states that he was pushing when he felt a sharp pop and pain and when the shoulder "gave out" and caused him to fall onto the shoulder itself.  He did not hit his head or lose consciousness.  He denies any headache or neck pain.  Patient's only complaint at this time is right shoulder pain.  Patient is on chemo currently for esophageal adenocarcinoma.  Patient has a prescription for pain medication secondary to his cancer diagnosis and did take 1 pill of pain medication prior to arrival.       Past Medical History:  Diagnosis Date   Esophageal adenocarcinoma ([place]) 06/23/2020   Family history of brain cancer    Family history of colon cancer    Family history of melanoma    Family history of stomach cancer    GERD (gastroesophageal reflux disease)    History of kidney stones    Nephrolithiasis    [facility]   Pancreatitis, acute    Personal history of colonic polyps    Dr Nicolasa Ducking   Psoriasis     Patient Active Problem List   Diagnosis Date Noted   Great toe pain, left 02/06/2021   Advance directive discussed with patient 02/06/2021   Genetic testing 09/07/2020   Family history of colon cancer    Family history of brain cancer    Family history of stomach cancer    Family history of melanoma    Goals of care, counseling/discussion 06/23/2020   Esophageal adenocarcinoma ([place]) 06/23/2020   Esophageal dysphagia 05/26/2020   Erectile dysfunction 09/20/2013   Routine general medical  examination at a health care facility 09/13/2011   Psoriasis 09/13/2011   ALLERGIC RHINITIS 04/28/2009   GERD 04/28/2009   COLONIC POLYPS, HX OF 04/28/2009    Past Surgical History:  Procedure Laterality Date   COLONOSCOPY     COLONOSCOPY WITH PROPOFOL N/A 03/31/2020   Procedure: COLONOSCOPY WITH PROPOFOL;  Surgeon: Jonathon Bellows, MD;  Location: [facility] ENDOSCOPY;  Service: Gastroenterology;  Laterality: N/A;   ERCP     ESOPHAGOGASTRODUODENOSCOPY (EGD) WITH PROPOFOL N/A 06/19/2020   Procedure: ESOPHAGOGASTRODUODENOSCOPY (EGD) WITH PROPOFOL;  Surgeon: Jonathon Bellows, MD;  Location: [facility] ENDOSCOPY;  Service: Gastroenterology;  Laterality: N/A;   groin surgery     as a child   PORTA CATH INSERTION N/A 06/28/2020   Procedure: PORTA CATH INSERTION;  Surgeon: Algernon Huxley, MD;  Location: [place] [facility];  Service: Cardiovascular;  Laterality: N/A;   SHOULDER SURGERY      Prior to Admission medications   Medication Sig Start Date End Date Taking? Authorizing Provider  oxyCODONE-acetaminophen (PERCOCET/ROXICET) 5-325 MG tablet Take 1 tablet by mouth every 6 (six) hours as needed for severe pain. 02/28/21  Yes Calla Wedekind, Charline Bills, PA-C  dexamethasone (DECADRON) 4 MG tablet Take 2 tablets (8 mg total) by mouth daily. Start the day after chemotherapy for 2 days. Take with food. 01/30/21   [name], MD  lidocaine-prilocaine (EMLA) cream Apply 1 application topically as needed. Apply small amount to port  site at least 1 hour prior to it being accessed, cover with plastic wrap 11/21/20   [name], MD  LORazepam (ATIVAN) 0.5 MG tablet Take 1 tablet (0.5 mg total) by mouth every 6 (six) hours as needed (Nausea or vomiting). Patient not taking: Reported on 02/27/2021 06/28/20   [name], MD  ondansetron (ZOFRAN) 8 MG tablet Take 1 tablet (8 mg total) by mouth 2 (two) times daily as needed for refractory nausea / vomiting. Start on day 3 after chemotherapy. Patient not taking: Reported on  02/27/2021 06/28/20   [name], MD  oxyCODONE (OXY IR/ROXICODONE) 5 MG immediate release tablet Take 1 tablet (5 mg total) by mouth every 8 (eight) hours as needed for severe pain. 07/03/20   [name], MD  pantoprazole (PROTONIX) 40 MG tablet TAKE 1 TABLET BY MOUTH TWICE A DAY BEFORE MEALS 10/23/20   Venia Carbon, MD  prochlorperazine (COMPAZINE) 10 MG tablet Take 1 tablet (10 mg total) by mouth every 6 (six) hours as needed (Nausea or vomiting). Patient not taking: Reported on 02/27/2021 06/28/20   [name], MD  triamcinolone cream (KENALOG) 0.1 % Apply 1 application topically 3 (three) times daily as needed. 12/07/20   Jacquelin Hawking, NP    Allergies Patient has no known allergies.  Family History  Problem Relation Age of Onset   Stomach cancer Mother    Diabetes Brother    Hypertension Brother    Hypertension Father    Prostate cancer Father    Colon cancer Sister    Cancer Other        either cervical or ovarian, d. 49s   Brain cancer Sister     Social History Social History   Tobacco Use   Smoking status: Former    Types: Cigarettes    Quit date: 07/08/1988    Years since quitting: 32.6   Smokeless tobacco: Never  Vaping Use   Vaping Use: Never used  Substance Use Topics   Alcohol use: No    Comment: recovering alcoholic for 13 years   Drug use: No     Review of Systems  Constitutional: No fever/chills Eyes: No visual changes. No discharge ENT: No upper respiratory complaints. Cardiovascular: no chest pain. Respiratory: no cough. No SOB. Gastrointestinal: No abdominal pain.  No nausea, no vomiting.  No diarrhea.  No constipation. Musculoskeletal: Positive for right shoulder pain/injury Skin: Negative for rash, abrasions, lacerations, ecchymosis. Neurological: Negative for headaches, focal weakness or numbness.  10 System ROS otherwise negative.  ____________________________________________   PHYSICAL EXAM:  VITAL SIGNS: ED Triage  Vitals  Enc Vitals Group     BP 02/28/21 2041 (!) 167/81     Pulse Rate 02/28/21 2041 88     Resp 02/28/21 2041 20     Temp 02/28/21 2041 98.5 F (36.9 C)     Temp Source 02/28/21 2041 Oral     SpO2 02/28/21 2041 96 %     Weight 02/28/21 2040 198 lb (89.8 kg)     Height 02/28/21 2040 '5\' 11"'$  (1.803 m)     Head Circumference --      Peak Flow --      Pain Score 02/28/21 2052 10     Pain Loc --      Pain Edu? --      Excl. in [place]? --      Constitutional: Alert and oriented. Well appearing and in no acute distress. Eyes: Conjunctivae are normal. PERRL. EOMI.  Head: Atraumatic. ENT:      Ears:       Nose: No congestion/rhinnorhea.      Mouth/Throat: Mucous membranes are moist.  Neck: No stridor.  No cervical spine tenderness to palpation  Cardiovascular: Normal rate, regular rhythm. Normal S1 and S2.  Good peripheral circulation. Respiratory: Normal respiratory effort without tachypnea or retractions. Lungs CTAB. Good air entry to the bases with no decreased or absent breath sounds. Musculoskeletal: Full range of motion to all extremities.  Appears to be edema versus deformity to the right shoulder at this time.  Palpation reveals significant tenderness over the humeral head with possible palpable deformity/dislocation.  No passive range of motion currently.  Examination elbow and forearm is unremarkable.  Radial pulse sensation intact distally. Neurologic:  Normal speech and language. No gross focal neurologic deficits are appreciated.  Skin:  Skin is warm, dry and intact. No rash noted. Psychiatric: Mood and affect are normal. Speech and behavior are normal. Patient exhibits appropriate insight and judgement.   ____________________________________________   LABS (all labs ordered are listed, but only abnormal results are displayed)  Labs Reviewed - No data to  display ____________________________________________  EKG   ____________________________________________  RADIOLOGY I personally viewed and evaluated these images as part of my medical decision making, as well as reviewing the written report by the radiologist.  ED Provider Interpretation: Findings consistent with an oblique fracture of the proximal humerus just below the surgical neck.  DG Shoulder Right  Result Date: 02/28/2021 CLINICAL DATA:  Right shoulder pain following fall, initial encounter EXAM: RIGHT SHOULDER - 2+ VIEW COMPARISON:  None. FINDINGS: There is an oblique fracture noted through the proximal right humeral shaft just below the surgical neck. The humeral head appears somewhat high-riding with remodeling of the acromion likely related to underlying chronic rotator cuff injury. No other fracture is seen. Degenerative changes of the acromioclavicular joint are noted. Underlying bony thorax appears within normal limits. IMPRESSION: Oblique proximal right humeral fracture just below the surgical neck. Degenerative changes about the shoulder joint as described. Electronically Signed   By: Inez Catalina M.D.   On: 02/28/2021 21:26    ____________________________________________    PROCEDURES  Procedure(s) performed:    Procedures    Medications  morphine 4 MG/ML injection 4 mg (4 mg Intravenous Given 02/28/21 2123)  ondansetron (ZOFRAN) injection 4 mg (4 mg Intravenous Given 02/28/21 2123)     ____________________________________________   INITIAL IMPRESSION / ASSESSMENT AND PLAN / ED COURSE  Pertinent labs & imaging results that were available during my care of the patient were reviewed by me and considered in my medical decision making (see chart for details).  Review of the [place] CSRS was performed in accordance of the [place] prior to dispensing any controlled drugs.           Patient's diagnosis is consistent with proximal humerus fracture.  Patient presented  to the emergency department after sustaining an injury to the right shoulder.  Patient was trying to move a trailer at the house when he had pushed, felt a sudden sharp pain in his right shoulder.  This caused him to stumble and fall onto the shoulder itself.  No other injury sustained.  Patient arrived with his arm in a sling.  Patient had been concerning for possible dislocation versus fracture of the humeral head/proximal humerus.  Imaging revealed a oblique fracture of the proximal humerus.  Patient will have sling applied, referred to orthopedics for further management.  Patient is  currently undergoing treatment for esophageal cancer and I have recommended discussing potential for surgery with his oncologist to see whether he would even be a candidate.  Patient is to see his oncologist tomorrow for an unrelated encounter and will mention same to them at this time.  Patient will then follow-up with orthopedics as described above for further management.  Return precautions discussed with the patient.  Patient with a prescription for pain medication. Patient is given ED precautions to return to the ED for any worsening or new symptoms.     ____________________________________________  FINAL CLINICAL IMPRESSION(S) / ED DIAGNOSES  Final diagnoses:  Other closed nondisplaced fracture of proximal end of right humerus, initial encounter      NEW MEDICATIONS STARTED DURING THIS VISIT:  ED Discharge Orders          Ordered    oxyCODONE-acetaminophen (PERCOCET/ROXICET) 5-325 MG tablet  Every 6 hours PRN        02/28/21 2241                This chart was dictated using voice recognition software/Dragon. Despite best efforts to proofread, errors can occur which can change the meaning. Any change was purely unintentional.    Darletta Moll, PA-C 02/28/21 2244    Rada Hay, MD 03/01/21 [phone]

## 2021-02-28 NOTE — ED Triage Notes (Signed)
T has right shoulder pain.  Injured today pushing a trailer.  Pt fell landing on shoulder.  Pt wearing a sling to right arm.  Chemo pt. Hx esophageal cancer

## 2021-02-28 NOTE — Discharge Instructions (Addendum)
Discuss the fact that you have a fracture in your humerus with your oncologist.  Ask whether they can recommend that you are safe or unsafe to proceed with a surgical repair if needed.  If they feel that it is safe to proceed with a surgical repair have been documented this in the chart to facilitate orthopedics if an intervention is needed.  I have prescribed some pain medication for you to take if you run out of your previously prescribed pain meds.

## 2021-03-01 ENCOUNTER — Telehealth: Payer: Self-pay | Admitting: *Deleted

## 2021-03-01 ENCOUNTER — Inpatient Hospital Stay: Payer: Medicare Other

## 2021-03-01 ENCOUNTER — Encounter: Payer: Self-pay | Admitting: Radiation Oncology

## 2021-03-01 ENCOUNTER — Ambulatory Visit
Admission: RE | Admit: 2021-03-01 | Discharge: 2021-03-01 | Disposition: A | Discharge status: Home / Self Care | Payer: Medicare Other | Source: Ambulatory Visit | Attending: Radiation Oncology | Admitting: Radiation Oncology

## 2021-03-01 VITALS — BP 157/76 | HR 85 | Temp 98.6°F | Resp 20

## 2021-03-01 VITALS — BP 157/76 | HR 85 | Temp 98.6°F | Resp 20 | Wt 198.2 lb

## 2021-03-01 DIAGNOSIS — S4291XA Fracture of right shoulder girdle, part unspecified, initial encounter for closed fracture: Secondary | ICD-10-CM | POA: Insufficient documentation

## 2021-03-01 DIAGNOSIS — Z9221 Personal history of antineoplastic chemotherapy: Secondary | ICD-10-CM | POA: Diagnosis not present

## 2021-03-01 DIAGNOSIS — C155 Malignant neoplasm of lower third of esophagus: Secondary | ICD-10-CM

## 2021-03-01 DIAGNOSIS — C787 Secondary malignant neoplasm of liver and intrahepatic bile duct: Secondary | ICD-10-CM | POA: Diagnosis not present

## 2021-03-01 DIAGNOSIS — C159 Malignant neoplasm of esophagus, unspecified: Secondary | ICD-10-CM

## 2021-03-01 DIAGNOSIS — Z923 Personal history of irradiation: Secondary | ICD-10-CM | POA: Diagnosis not present

## 2021-03-01 DIAGNOSIS — Z5112 Encounter for antineoplastic immunotherapy: Secondary | ICD-10-CM | POA: Diagnosis not present

## 2021-03-01 MED ORDER — HEPARIN SOD (PORK) LOCK FLUSH 100 UNIT/ML IV SOLN
500.0000 [IU] | Freq: Once | INTRAVENOUS | Status: AC | PRN
  Filled 2021-03-01: qty 5

## 2021-03-01 MED ORDER — PEGFILGRASTIM-CBQV 6 MG/0.6ML ~~LOC~~ SOSY
6.0000 mg | PREFILLED_SYRINGE | Freq: Once | SUBCUTANEOUS | Status: AC
  Administered 2021-03-01: 6 mg via SUBCUTANEOUS
  Filled 2021-03-01: qty 0.6

## 2021-03-01 MED ORDER — HEPARIN SOD (PORK) LOCK FLUSH 100 UNIT/ML IV SOLN
INTRAVENOUS | Status: AC
  Administered 2021-03-01: 500 [IU]
  Filled 2021-03-01: qty 5

## 2021-03-01 MED ORDER — SODIUM CHLORIDE 0.9% FLUSH
10.0000 mL | INTRAVENOUS | Status: DC | PRN
  Administered 2021-03-01: 10 mL
  Filled 2021-03-01: qty 10

## 2021-03-01 NOTE — Telephone Encounter (Signed)
Today I got a message from Dr. [name] that she had gotten information that the patient had been in the emergency room for a fracture.  Patient was supposed to come over today and get his pump DC'd.  I talked to the patient over the phone and wanted to know if he had gotten an appointment with Ortho.  He was told that he should go to [facility] and see Dr. Thornton Park. The patients step son will call and get him an appt. Once the pt's sees MD then let me know and then we will get ortho doctor and Dr. [name] to speak about surgery. Dr. [name] is ok with pt having surgery. Patient and Joslyn Hy will let us know.

## 2021-03-01 NOTE — Progress Notes (Signed)
Radiation Oncology Follow up Note  Name: Cory [name]   Date:   03/01/2021 MRN:  [id number] DOB: [birth year]    This 65 y.o. 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  Seen today in routine follow-up he is doing well he is swallowing fine really without complaints..  He is currently completedcycle 18 of 5-FU trastuzumab chemotherapy he recently fractured his arm and is seeing orthopedic surgery next week for that.  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  He had a CT scan back in July showing mild distal esophageal wall thickening with decreased in size and conspicuously of the numerous small hepatic metastasis no evidence of progressive disease.  COMPLICATIONS OF TREATMENT: none  FOLLOW UP COMPLIANCE: keeps appointments   PHYSICAL EXAM:  BP (!) 157/76 (Patient Position: Sitting)   Pulse 85   Temp 98.6 F (37 C) (Tympanic)   Resp 20   Wt 198 lb 3.2 oz (89.9 kg)   BMI 27.64 kg/m  Well-developed well-nourished patient in NAD. HEENT reveals PERLA, EOMI, discs not visualized.  Oral cavity is clear. No oral mucosal lesions are identified. Neck is clear without evidence of cervical or supraclavicular adenopathy. Lungs are clear to A&P. Cardiac examination is essentially unremarkable with regular rate and rhythm without murmur rub or thrill. Abdomen is benign with no organomegaly or masses noted. Motor sensory and DTR levels are equal and symmetric in the upper and lower extremities. Cranial nerves II through XII are grossly intact. Proprioception is intact. No peripheral adenopathy or edema is identified. No motor or  sensory levels are noted. Crude visual fields are within normal range.  RADIOLOGY RESULTS: CT scan chest abdomen pelvis reviewed compatible with above-stated findings  PLAN: Present time patient is currently undergoing chemotherapy is doing well he is stable with excellent swallowing ability.  He will see orthopedic surgery for his recent right shoulder fracture.  I will turn follow-up care over to medical oncology.  Be happy to reevaluate the patient in time should further treatment be indicated.  Patient knows to call with any concerns.  I would like to take this opportunity to thank you for allowing me to participate in the care of your patient..Noreene Filbert MD

## 2021-03-02 ENCOUNTER — Telehealth: Payer: Self-pay

## 2021-03-02 NOTE — Telephone Encounter (Signed)
Called and spoke to pt about recent ER visit for a fracture of his arm. He said he is doing pretty good. Goes to ortho next week.

## 2021-03-12 ENCOUNTER — Other Ambulatory Visit: Payer: Self-pay | Admitting: *Deleted

## 2021-03-12 DIAGNOSIS — C159 Malignant neoplasm of esophagus, unspecified: Secondary | ICD-10-CM

## 2021-03-13 ENCOUNTER — Inpatient Hospital Stay (HOSPITAL_BASED_OUTPATIENT_CLINIC_OR_DEPARTMENT_OTHER): Payer: Medicare Other | Admitting: Oncology

## 2021-03-13 ENCOUNTER — Other Ambulatory Visit: Payer: Self-pay

## 2021-03-13 ENCOUNTER — Inpatient Hospital Stay: Payer: Medicare Other

## 2021-03-13 ENCOUNTER — Inpatient Hospital Stay: Payer: Medicare Other | Attending: Oncology

## 2021-03-13 ENCOUNTER — Encounter: Payer: Self-pay | Admitting: Oncology

## 2021-03-13 ENCOUNTER — Other Ambulatory Visit: Payer: Self-pay | Admitting: *Deleted

## 2021-03-13 VITALS — BP 142/69 | HR 74 | Temp 98.1°F | Resp 17 | Wt 208.0 lb

## 2021-03-13 DIAGNOSIS — Z5111 Encounter for antineoplastic chemotherapy: Secondary | ICD-10-CM

## 2021-03-13 DIAGNOSIS — Z5189 Encounter for other specified aftercare: Secondary | ICD-10-CM | POA: Diagnosis not present

## 2021-03-13 DIAGNOSIS — R1310 Dysphagia, unspecified: Secondary | ICD-10-CM | POA: Insufficient documentation

## 2021-03-13 DIAGNOSIS — Z808 Family history of malignant neoplasm of other organs or systems: Secondary | ICD-10-CM | POA: Insufficient documentation

## 2021-03-13 DIAGNOSIS — C159 Malignant neoplasm of esophagus, unspecified: Secondary | ICD-10-CM | POA: Diagnosis not present

## 2021-03-13 DIAGNOSIS — K219 Gastro-esophageal reflux disease without esophagitis: Secondary | ICD-10-CM | POA: Diagnosis not present

## 2021-03-13 DIAGNOSIS — C787 Secondary malignant neoplasm of liver and intrahepatic bile duct: Secondary | ICD-10-CM | POA: Diagnosis not present

## 2021-03-13 DIAGNOSIS — Z833 Family history of diabetes mellitus: Secondary | ICD-10-CM | POA: Diagnosis not present

## 2021-03-13 DIAGNOSIS — Z5112 Encounter for antineoplastic immunotherapy: Secondary | ICD-10-CM | POA: Insufficient documentation

## 2021-03-13 DIAGNOSIS — Z800 Family history of malignant neoplasm of digestive organs: Secondary | ICD-10-CM | POA: Diagnosis not present

## 2021-03-13 DIAGNOSIS — Z8042 Family history of malignant neoplasm of prostate: Secondary | ICD-10-CM | POA: Diagnosis not present

## 2021-03-13 DIAGNOSIS — Z8249 Family history of ischemic heart disease and other diseases of the circulatory system: Secondary | ICD-10-CM | POA: Diagnosis not present

## 2021-03-13 DIAGNOSIS — Z87891 Personal history of nicotine dependence: Secondary | ICD-10-CM | POA: Diagnosis not present

## 2021-03-13 DIAGNOSIS — Z79899 Other long term (current) drug therapy: Secondary | ICD-10-CM | POA: Insufficient documentation

## 2021-03-13 DIAGNOSIS — C155 Malignant neoplasm of lower third of esophagus: Secondary | ICD-10-CM | POA: Diagnosis present

## 2021-03-13 DIAGNOSIS — C160 Malignant neoplasm of cardia: Secondary | ICD-10-CM

## 2021-03-13 DIAGNOSIS — Z8049 Family history of malignant neoplasm of other genital organs: Secondary | ICD-10-CM | POA: Diagnosis not present

## 2021-03-13 LAB — COMPREHENSIVE METABOLIC PANEL
ALT: 17 U/L (ref 0–44)
AST: 21 U/L (ref 15–41)
Albumin: 3.7 g/dL (ref 3.5–5.0)
Alkaline Phosphatase: 155 U/L — ABNORMAL HIGH (ref 38–126)
Anion gap: 8 (ref 5–15)
BUN: 14 mg/dL (ref 8–23)
CO2: 24 mmol/L (ref 22–32)
Calcium: 8.5 mg/dL — ABNORMAL LOW (ref 8.9–10.3)
Chloride: 103 mmol/L (ref 98–111)
Creatinine, Ser: 0.86 mg/dL (ref 0.61–1.24)
GFR, Estimated: >60 mL/min (ref >60)
Glucose, Bld: 128 mg/dL — ABNORMAL HIGH (ref 70–99)
Potassium: 3.6 mmol/L (ref 3.5–5.1)
Sodium: 135 mmol/L (ref 135–145)
Total Bilirubin: 1 mg/dL (ref 0.3–1.2)
Total Protein: 6.6 g/dL (ref 6.5–8.1)

## 2021-03-13 LAB — CBC WITH DIFFERENTIAL/PLATELET
Abs Immature Granulocytes: 0.09 10*3/uL — ABNORMAL HIGH (ref 0.00–0.07)
Basophils Absolute: 0 10*3/uL (ref 0.0–0.1)
Basophils Relative: 0 %
Eosinophils Absolute: 0.3 10*3/uL (ref 0.0–0.5)
Eosinophils Relative: 3 %
HCT: 33.2 % — ABNORMAL LOW (ref 39.0–52.0)
Hemoglobin: 10.9 g/dL — ABNORMAL LOW (ref 13.0–17.0)
Immature Granulocytes: 1 %
Lymphocytes Relative: 8 %
Lymphs Abs: 1 10*3/uL (ref 0.7–4.0)
MCH: 31 pg (ref 26.0–34.0)
MCHC: 32.8 g/dL (ref 30.0–36.0)
MCV: 94.3 fL (ref 80.0–100.0)
Monocytes Absolute: 0.7 10*3/uL (ref 0.1–1.0)
Monocytes Relative: 6 %
Neutro Abs: 9.3 10*3/uL — ABNORMAL HIGH (ref 1.7–7.7)
Neutrophils Relative %: 82 %
Platelets: 160 10*3/uL (ref 150–400)
RBC: 3.52 MIL/uL — ABNORMAL LOW (ref 4.22–5.81)
RDW: 15.5 % (ref 11.5–15.5)
WBC: 11.4 10*3/uL — ABNORMAL HIGH (ref 4.0–10.5)
nRBC: 0 % (ref 0.0–0.2)

## 2021-03-13 MED ORDER — TRASTUZUMAB-ANNS CHEMO 150 MG IV SOLR
4.0000 mg/kg | Freq: Once | INTRAVENOUS | Status: AC
  Administered 2021-03-13: 357 mg via INTRAVENOUS
  Filled 2021-03-13: qty 17

## 2021-03-13 MED ORDER — SODIUM CHLORIDE 0.9 % IV SOLN
Freq: Once | INTRAVENOUS | Status: AC
  Filled 2021-03-13: qty 250

## 2021-03-13 MED ORDER — DIPHENHYDRAMINE HCL 50 MG/ML IJ SOLN
50.0000 mg | Freq: Once | INTRAMUSCULAR | Status: AC
  Administered 2021-03-13: 50 mg via INTRAVENOUS
  Filled 2021-03-13: qty 1

## 2021-03-13 MED ORDER — PALONOSETRON HCL INJECTION 0.25 MG/5ML
0.2500 mg | Freq: Once | INTRAVENOUS | Status: AC
  Administered 2021-03-13: 0.25 mg via INTRAVENOUS
  Filled 2021-03-13: qty 5

## 2021-03-13 MED ORDER — FLUOROURACIL CHEMO INJECTION 2.5 GM/50ML
400.0000 mg/m2 | Freq: Once | INTRAVENOUS | Status: AC
  Administered 2021-03-13: 850 mg via INTRAVENOUS
  Filled 2021-03-13: qty 17

## 2021-03-13 MED ORDER — SODIUM CHLORIDE 0.9% FLUSH
10.0000 mL | Freq: Once | INTRAVENOUS | Status: AC
  Administered 2021-03-13: 10 mL via INTRAVENOUS
  Filled 2021-03-13: qty 10

## 2021-03-13 MED ORDER — SODIUM CHLORIDE 0.9% FLUSH
10.0000 mL | Freq: Once | INTRAVENOUS | Status: AC
  Filled 2021-03-13: qty 10

## 2021-03-13 MED ORDER — SODIUM CHLORIDE 0.9 % IV SOLN
10.0000 mg | Freq: Once | INTRAVENOUS | Status: AC
  Administered 2021-03-13: 10 mg via INTRAVENOUS
  Filled 2021-03-13: qty 10

## 2021-03-13 MED ORDER — OXYCODONE HCL 5 MG PO TABS: 5.0000 mg | ORAL_TABLET | Freq: Three times a day (TID) | ORAL | 0 refills | Status: DC | PRN

## 2021-03-13 MED ORDER — LEUCOVORIN CALCIUM INJECTION 350 MG
850.0000 mg | Freq: Once | INTRAVENOUS | Status: AC
  Administered 2021-03-13: 850 mg via INTRAVENOUS
  Filled 2021-03-13: qty 42.5

## 2021-03-13 MED ORDER — SODIUM CHLORIDE 0.9 % IV SOLN
5000.0000 mg | INTRAVENOUS | Status: DC
  Administered 2021-03-13: 5000 mg via INTRAVENOUS
  Filled 2021-03-13: qty 100

## 2021-03-13 MED ORDER — ACETAMINOPHEN 325 MG PO TABS
650.0000 mg | ORAL_TABLET | Freq: Once | ORAL | Status: AC
  Administered 2021-03-13: 650 mg via ORAL
  Filled 2021-03-13: qty 2

## 2021-03-13 MED ORDER — SODIUM CHLORIDE 0.9 % IV SOLN
400.0000 mg | Freq: Once | INTRAVENOUS | Status: AC
  Administered 2021-03-13: 400 mg via INTRAVENOUS
  Filled 2021-03-13: qty 16

## 2021-03-13 NOTE — Progress Notes (Signed)
Hematology/Oncology Consult note [facility]  Telephone:([phone] [phone]  Patient Care Team: Venia Carbon, MD as PCP - General [name], RN as Oncology Nurse Navigator [name], MD as Consulting Physician (Hematology and Oncology)   Name of the patient: Cory [name]  [id number]  02-26-1956   Date of visit: 03/13/21  Diagnosis-metastatic esophageal cancer with liver metastases  Chief complaint/ Reason for visit-on treatment assessment prior to cycle 19 of 5-FU trastuzumab chemotherapy along with Keytruda  Heme/Onc history: Patient is a 65 year old male who presented with symptoms of indigestion and heartburn which gradually progressed to dysphagia.  He underwent EGD on 06/20/2020 By Dr. Vicente Males which showed a large nearly obstructing mass in the lower third of the esophagus 35 cm from incisors.  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  PD-l1 <1 %, TMB HIGH    Presently patient reports dysphagia but is able to eat cereal or soft food.  He has lost about 13 pounds in the last month itself.  Denies any significant pain.  He recently retired after many years of service and is otherwise independent of his ADLs and IADLs.     PET CT scan on 06/26/2020 showed circumferential distal esophageal mass with an SUV of 13.3 extending over 5.5 cm. Right lower paratracheal node is 0.6 cm with an SUV of 3.1. Hypermetabolic right gastric lymph nodes including 1.1 cm node with an SUV of 5.8. Numerous hypermetabolic lesions throughout the liver somewhat confluent around the periphery which were hypermetabolic between 8 and 9. Faintly hypermetabolic left adrenal gland   NGS testing showed high tumor mutational burden CPS score less than 1. CD9-NRG1 fusion. BRAF 581L, CCN E1 gain.  EMLA for fusion of BX [id number], FGF 23 gain, FGF 6 gain, MET R988C, T p53 S1 68F. ERBB2 gain but no other actionable mutations   Patient  currently on first-line FOLFOX/trastuzumab/Keytruda.  He gets Keytruda every 6 weeks  Interval history-patient has a sling in place over his right upper extremity.  No plans for surgical management at this time.  Otherwise doing well and denies the complaints at this time  ECOG PS- 1 Pain scale- 3 Opioid associated constipation- \no  Review of systems- Review of Systems  Constitutional:  Negative for chills, fever, malaise/fatigue and weight loss.  HENT:  Negative for congestion, ear discharge and nosebleeds.   Eyes:  Negative for blurred vision.  Respiratory:  Negative for cough, hemoptysis, sputum production, shortness of breath and wheezing.   Cardiovascular:  Negative for chest pain, palpitations, orthopnea and claudication.  Gastrointestinal:  Negative for abdominal pain, blood in stool, constipation, diarrhea, heartburn, melena, nausea and vomiting.  Genitourinary:  Negative for dysuria, flank pain, frequency, hematuria and urgency.  Musculoskeletal:  Negative for back pain, joint pain and myalgias.       Right arm pain  Skin:  Negative for rash.  Neurological:  Negative for dizziness, tingling, focal weakness, seizures, weakness and headaches.  Endo/Heme/Allergies:  Does not bruise/bleed easily.  Psychiatric/Behavioral:  Negative for depression and suicidal ideas. The patient does not have insomnia.       No Known Allergies   Past Medical History:  Diagnosis Date   Esophageal adenocarcinoma ([place]) 06/23/2020   Family history of brain cancer    Family history of colon cancer    Family history of melanoma    Family history of stomach cancer    GERD (gastroesophageal reflux disease)    History  injury. No other fracture is seen. Degenerative changes of the acromioclavicular joint are noted. Underlying bony thorax appears within normal limits. IMPRESSION: Oblique proximal right humeral fracture just below the surgical neck. Degenerative changes about the shoulder joint as described. Electronically Signed   By: Inez Catalina M.D.   On: 02/28/2021 21:26   ECHOCARDIOGRAM COMPLETE  Result Date: 02/15/2021    ECHOCARDIOGRAM REPORT   Patient Name:   Cory [name] Date of Exam: 02/15/2021 Medical Rec #:  [id number]      Height:       69.8 in Accession #:    [id number]     Weight:       194.2 lb Date of Birth:  [birth year]     BSA:          2.056 m Patient Age:    65 years       BP:           134/57 mmHg Patient Gender: M              HR:           72 bpm. Exam Location:  [facility] Procedure: 2D Echo, Color Doppler, Cardiac Doppler and Strain Analysis Indications:     C15.9 Esophageal adenocarcinoma                  C79.9 Metastatic HER2 positive neoplasm of gastroesophageal                  function                  Z51.12 encounter for antineoplastic immunotherapy  History:         Patient has prior history of Echocardiogram examinations, most                  recent 10/16/2020.  Sonographer:     Sherrie Sport RDCS (AE) Referring Phys:  [id number] Weston Anna Jazzmin Newbold Diagnosing Phys: [name] MD  Sonographer Comments: Global longitudinal strain was attempted. IMPRESSIONS  1. Left ventricular ejection fraction, by estimation, is 55 to 60%. The left ventricle has normal function. The left ventricle has no regional wall motion abnormalities. There is mild left ventricular hypertrophy. Left ventricular diastolic parameters were normal. The average left ventricular global longitudinal strain is -16.6 %. The global longitudinal strain is normal.  2. Right ventricular systolic function is normal. The right ventricular size is normal. There is normal  pulmonary artery systolic pressure.  3. Left atrial size was mildly dilated.  4. The mitral valve is normal in structure. Mild mitral valve regurgitation. No evidence of mitral stenosis.  5. The aortic valve is normal in structure. Aortic valve regurgitation is not visualized. No aortic stenosis is present.  6. The inferior vena cava is normal in size with greater than 50% respiratory variability, suggesting right atrial pressure of 3 mmHg. FINDINGS  Left Ventricle: Left ventricular ejection fraction, by estimation, is 55 to 60%. The left ventricle has normal function. The left ventricle has no regional wall motion abnormalities. The average left ventricular global longitudinal strain is -16.6 %. The global longitudinal strain is normal. Global longitudinal strain performed but not reported based on interpreter judgement due to suboptimal tracking. The left ventricular internal cavity size was normal in size. There is mild left ventricular hypertrophy. Left ventricular diastolic parameters were normal. Right Ventricle: The right ventricular size is normal. No increase in right ventricular wall thickness. Right ventricular systolic function is normal. There is normal  Hematology/Oncology Consult note [facility]  Telephone:([phone] [phone]  Patient Care Team: Venia Carbon, MD as PCP - General [name], RN as Oncology Nurse Navigator [name], MD as Consulting Physician (Hematology and Oncology)   Name of the patient: Cory [name]  [id number]  02-26-1956   Date of visit: 03/13/21  Diagnosis-metastatic esophageal cancer with liver metastases  Chief complaint/ Reason for visit-on treatment assessment prior to cycle 19 of 5-FU trastuzumab chemotherapy along with Keytruda  Heme/Onc history: Patient is a 65 year old male who presented with symptoms of indigestion and heartburn which gradually progressed to dysphagia.  He underwent EGD on 06/20/2020 By Dr. Vicente Males which showed a large nearly obstructing mass in the lower third of the esophagus 35 cm from incisors.  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  PD-l1 <1 %, TMB HIGH    Presently patient reports dysphagia but is able to eat cereal or soft food.  He has lost about 13 pounds in the last month itself.  Denies any significant pain.  He recently retired after many years of service and is otherwise independent of his ADLs and IADLs.     PET CT scan on 06/26/2020 showed circumferential distal esophageal mass with an SUV of 13.3 extending over 5.5 cm. Right lower paratracheal node is 0.6 cm with an SUV of 3.1. Hypermetabolic right gastric lymph nodes including 1.1 cm node with an SUV of 5.8. Numerous hypermetabolic lesions throughout the liver somewhat confluent around the periphery which were hypermetabolic between 8 and 9. Faintly hypermetabolic left adrenal gland   NGS testing showed high tumor mutational burden CPS score less than 1. CD9-NRG1 fusion. BRAF 581L, CCN E1 gain.  EMLA for fusion of BX [id number], FGF 23 gain, FGF 6 gain, MET R988C, T p53 S1 68F. ERBB2 gain but no other actionable mutations   Patient  currently on first-line FOLFOX/trastuzumab/Keytruda.  He gets Keytruda every 6 weeks  Interval history-patient has a sling in place over his right upper extremity.  No plans for surgical management at this time.  Otherwise doing well and denies the complaints at this time  ECOG PS- 1 Pain scale- 3 Opioid associated constipation- \no  Review of systems- Review of Systems  Constitutional:  Negative for chills, fever, malaise/fatigue and weight loss.  HENT:  Negative for congestion, ear discharge and nosebleeds.   Eyes:  Negative for blurred vision.  Respiratory:  Negative for cough, hemoptysis, sputum production, shortness of breath and wheezing.   Cardiovascular:  Negative for chest pain, palpitations, orthopnea and claudication.  Gastrointestinal:  Negative for abdominal pain, blood in stool, constipation, diarrhea, heartburn, melena, nausea and vomiting.  Genitourinary:  Negative for dysuria, flank pain, frequency, hematuria and urgency.  Musculoskeletal:  Negative for back pain, joint pain and myalgias.       Right arm pain  Skin:  Negative for rash.  Neurological:  Negative for dizziness, tingling, focal weakness, seizures, weakness and headaches.  Endo/Heme/Allergies:  Does not bruise/bleed easily.  Psychiatric/Behavioral:  Negative for depression and suicidal ideas. The patient does not have insomnia.       No Known Allergies   Past Medical History:  Diagnosis Date   Esophageal adenocarcinoma ([place]) 06/23/2020   Family history of brain cancer    Family history of colon cancer    Family history of melanoma    Family history of stomach cancer    GERD (gastroesophageal reflux disease)    History  injury. No other fracture is seen. Degenerative changes of the acromioclavicular joint are noted. Underlying bony thorax appears within normal limits. IMPRESSION: Oblique proximal right humeral fracture just below the surgical neck. Degenerative changes about the shoulder joint as described. Electronically Signed   By: Inez Catalina M.D.   On: 02/28/2021 21:26   ECHOCARDIOGRAM COMPLETE  Result Date: 02/15/2021    ECHOCARDIOGRAM REPORT   Patient Name:   Cory [name] Date of Exam: 02/15/2021 Medical Rec #:  [id number]      Height:       69.8 in Accession #:    [id number]     Weight:       194.2 lb Date of Birth:  [birth year]     BSA:          2.056 m Patient Age:    65 years       BP:           134/57 mmHg Patient Gender: M              HR:           72 bpm. Exam Location:  [facility] Procedure: 2D Echo, Color Doppler, Cardiac Doppler and Strain Analysis Indications:     C15.9 Esophageal adenocarcinoma                  C79.9 Metastatic HER2 positive neoplasm of gastroesophageal                  function                  Z51.12 encounter for antineoplastic immunotherapy  History:         Patient has prior history of Echocardiogram examinations, most                  recent 10/16/2020.  Sonographer:     Sherrie Sport RDCS (AE) Referring Phys:  [id number] Weston Anna Jazzmin Newbold Diagnosing Phys: [name] MD  Sonographer Comments: Global longitudinal strain was attempted. IMPRESSIONS  1. Left ventricular ejection fraction, by estimation, is 55 to 60%. The left ventricle has normal function. The left ventricle has no regional wall motion abnormalities. There is mild left ventricular hypertrophy. Left ventricular diastolic parameters were normal. The average left ventricular global longitudinal strain is -16.6 %. The global longitudinal strain is normal.  2. Right ventricular systolic function is normal. The right ventricular size is normal. There is normal  pulmonary artery systolic pressure.  3. Left atrial size was mildly dilated.  4. The mitral valve is normal in structure. Mild mitral valve regurgitation. No evidence of mitral stenosis.  5. The aortic valve is normal in structure. Aortic valve regurgitation is not visualized. No aortic stenosis is present.  6. The inferior vena cava is normal in size with greater than 50% respiratory variability, suggesting right atrial pressure of 3 mmHg. FINDINGS  Left Ventricle: Left ventricular ejection fraction, by estimation, is 55 to 60%. The left ventricle has normal function. The left ventricle has no regional wall motion abnormalities. The average left ventricular global longitudinal strain is -16.6 %. The global longitudinal strain is normal. Global longitudinal strain performed but not reported based on interpreter judgement due to suboptimal tracking. The left ventricular internal cavity size was normal in size. There is mild left ventricular hypertrophy. Left ventricular diastolic parameters were normal. Right Ventricle: The right ventricular size is normal. No increase in right ventricular wall thickness. Right ventricular systolic function is normal. There is normal  Hematology/Oncology Consult note [facility]  Telephone:([phone] [phone]  Patient Care Team: Venia Carbon, MD as PCP - General [name], RN as Oncology Nurse Navigator [name], MD as Consulting Physician (Hematology and Oncology)   Name of the patient: Cory [name]  [id number]  02-26-1956   Date of visit: 03/13/21  Diagnosis-metastatic esophageal cancer with liver metastases  Chief complaint/ Reason for visit-on treatment assessment prior to cycle 19 of 5-FU trastuzumab chemotherapy along with Keytruda  Heme/Onc history: Patient is a 65 year old male who presented with symptoms of indigestion and heartburn which gradually progressed to dysphagia.  He underwent EGD on 06/20/2020 By Dr. Vicente Males which showed a large nearly obstructing mass in the lower third of the esophagus 35 cm from incisors.  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  PD-l1 <1 %, TMB HIGH    Presently patient reports dysphagia but is able to eat cereal or soft food.  He has lost about 13 pounds in the last month itself.  Denies any significant pain.  He recently retired after many years of service and is otherwise independent of his ADLs and IADLs.     PET CT scan on 06/26/2020 showed circumferential distal esophageal mass with an SUV of 13.3 extending over 5.5 cm. Right lower paratracheal node is 0.6 cm with an SUV of 3.1. Hypermetabolic right gastric lymph nodes including 1.1 cm node with an SUV of 5.8. Numerous hypermetabolic lesions throughout the liver somewhat confluent around the periphery which were hypermetabolic between 8 and 9. Faintly hypermetabolic left adrenal gland   NGS testing showed high tumor mutational burden CPS score less than 1. CD9-NRG1 fusion. BRAF 581L, CCN E1 gain.  EMLA for fusion of BX [id number], FGF 23 gain, FGF 6 gain, MET R988C, T p53 S1 68F. ERBB2 gain but no other actionable mutations   Patient  currently on first-line FOLFOX/trastuzumab/Keytruda.  He gets Keytruda every 6 weeks  Interval history-patient has a sling in place over his right upper extremity.  No plans for surgical management at this time.  Otherwise doing well and denies the complaints at this time  ECOG PS- 1 Pain scale- 3 Opioid associated constipation- \no  Review of systems- Review of Systems  Constitutional:  Negative for chills, fever, malaise/fatigue and weight loss.  HENT:  Negative for congestion, ear discharge and nosebleeds.   Eyes:  Negative for blurred vision.  Respiratory:  Negative for cough, hemoptysis, sputum production, shortness of breath and wheezing.   Cardiovascular:  Negative for chest pain, palpitations, orthopnea and claudication.  Gastrointestinal:  Negative for abdominal pain, blood in stool, constipation, diarrhea, heartburn, melena, nausea and vomiting.  Genitourinary:  Negative for dysuria, flank pain, frequency, hematuria and urgency.  Musculoskeletal:  Negative for back pain, joint pain and myalgias.       Right arm pain  Skin:  Negative for rash.  Neurological:  Negative for dizziness, tingling, focal weakness, seizures, weakness and headaches.  Endo/Heme/Allergies:  Does not bruise/bleed easily.  Psychiatric/Behavioral:  Negative for depression and suicidal ideas. The patient does not have insomnia.       No Known Allergies   Past Medical History:  Diagnosis Date   Esophageal adenocarcinoma ([place]) 06/23/2020   Family history of brain cancer    Family history of colon cancer    Family history of melanoma    Family history of stomach cancer    GERD (gastroesophageal reflux disease)    History  Hematology/Oncology Consult note [facility]  Telephone:([phone] [phone]  Patient Care Team: Venia Carbon, MD as PCP - General [name], RN as Oncology Nurse Navigator [name], MD as Consulting Physician (Hematology and Oncology)   Name of the patient: Cory [name]  [id number]  02-26-1956   Date of visit: 03/13/21  Diagnosis-metastatic esophageal cancer with liver metastases  Chief complaint/ Reason for visit-on treatment assessment prior to cycle 19 of 5-FU trastuzumab chemotherapy along with Keytruda  Heme/Onc history: Patient is a 65 year old male who presented with symptoms of indigestion and heartburn which gradually progressed to dysphagia.  He underwent EGD on 06/20/2020 By Dr. Vicente Males which showed a large nearly obstructing mass in the lower third of the esophagus 35 cm from incisors.  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  PD-l1 <1 %, TMB HIGH    Presently patient reports dysphagia but is able to eat cereal or soft food.  He has lost about 13 pounds in the last month itself.  Denies any significant pain.  He recently retired after many years of service and is otherwise independent of his ADLs and IADLs.     PET CT scan on 06/26/2020 showed circumferential distal esophageal mass with an SUV of 13.3 extending over 5.5 cm. Right lower paratracheal node is 0.6 cm with an SUV of 3.1. Hypermetabolic right gastric lymph nodes including 1.1 cm node with an SUV of 5.8. Numerous hypermetabolic lesions throughout the liver somewhat confluent around the periphery which were hypermetabolic between 8 and 9. Faintly hypermetabolic left adrenal gland   NGS testing showed high tumor mutational burden CPS score less than 1. CD9-NRG1 fusion. BRAF 581L, CCN E1 gain.  EMLA for fusion of BX [id number], FGF 23 gain, FGF 6 gain, MET R988C, T p53 S1 68F. ERBB2 gain but no other actionable mutations   Patient  currently on first-line FOLFOX/trastuzumab/Keytruda.  He gets Keytruda every 6 weeks  Interval history-patient has a sling in place over his right upper extremity.  No plans for surgical management at this time.  Otherwise doing well and denies the complaints at this time  ECOG PS- 1 Pain scale- 3 Opioid associated constipation- \no  Review of systems- Review of Systems  Constitutional:  Negative for chills, fever, malaise/fatigue and weight loss.  HENT:  Negative for congestion, ear discharge and nosebleeds.   Eyes:  Negative for blurred vision.  Respiratory:  Negative for cough, hemoptysis, sputum production, shortness of breath and wheezing.   Cardiovascular:  Negative for chest pain, palpitations, orthopnea and claudication.  Gastrointestinal:  Negative for abdominal pain, blood in stool, constipation, diarrhea, heartburn, melena, nausea and vomiting.  Genitourinary:  Negative for dysuria, flank pain, frequency, hematuria and urgency.  Musculoskeletal:  Negative for back pain, joint pain and myalgias.       Right arm pain  Skin:  Negative for rash.  Neurological:  Negative for dizziness, tingling, focal weakness, seizures, weakness and headaches.  Endo/Heme/Allergies:  Does not bruise/bleed easily.  Psychiatric/Behavioral:  Negative for depression and suicidal ideas. The patient does not have insomnia.       No Known Allergies   Past Medical History:  Diagnosis Date   Esophageal adenocarcinoma ([place]) 06/23/2020   Family history of brain cancer    Family history of colon cancer    Family history of melanoma    Family history of stomach cancer    GERD (gastroesophageal reflux disease)    History

## 2021-03-13 NOTE — Progress Notes (Signed)
Patient here for oncology follow-up appointment, concerns of leg swelling and sore rash on nose

## 2021-03-13 NOTE — Patient Instructions (Signed)
[facility]  Discharge Instructions: Thank you for choosing [facility] to provide your oncology and hematology care.  If you have a lab appointment with the [facility], please go directly to the [address] and check in at the registration area.  Wear comfortable clothing and clothing appropriate for easy access to any Portacath or PICC line.   We strive to give you quality time with your provider. You may need to reschedule your appointment if you arrive late (15 or more minutes).  Arriving late affects you and other patients whose appointments are after yours.  Also, if you miss three or more appointments without notifying the office, you may be dismissed from the clinic at the provider's discretion.      For prescription refill requests, have your pharmacy contact our office and allow 72 hours for refills to be completed.    Today you received the following chemotherapy and/or immunotherapy agents Keytruda, Kanjinti, leucovorin, Adrucil    To help prevent nausea and vomiting after your treatment, we encourage you to take your nausea medication as directed.  BELOW ARE SYMPTOMS THAT SHOULD BE REPORTED IMMEDIATELY: *FEVER GREATER THAN 100.4 F (38 C) OR HIGHER *CHILLS OR SWEATING *NAUSEA AND VOMITING THAT IS NOT CONTROLLED WITH YOUR NAUSEA MEDICATION *UNUSUAL SHORTNESS OF BREATH *UNUSUAL BRUISING OR BLEEDING *URINARY PROBLEMS (pain or burning when urinating, or frequent urination) *BOWEL PROBLEMS (unusual diarrhea, constipation, pain near the anus) TENDERNESS IN MOUTH AND THROAT WITH OR WITHOUT PRESENCE OF ULCERS (sore throat, sores in mouth, or a toothache) UNUSUAL RASH, SWELLING OR PAIN  UNUSUAL VAGINAL DISCHARGE OR ITCHING   Items with * indicate a potential emergency and should be followed up as soon as possible or go to the Emergency Department if any problems should occur.  Please show the CHEMOTHERAPY ALERT CARD or  IMMUNOTHERAPY ALERT CARD at check-in to the Emergency Department and triage nurse.  Should you have questions after your visit or need to cancel or reschedule your appointment, please contact [facility]  [phone] and follow the prompts.  Office hours are 8:00 a.m. to 4:30 p.m. Monday - Friday. Please note that voicemails left after 4:00 p.m. may not be returned until the following business day.  We are closed weekends and major holidays. You have access to a nurse at all times for urgent questions. Please call the main number to the clinic [phone] and follow the prompts.  For any non-urgent questions, you may also contact your provider using MyChart. We now offer e-Visits for anyone 74 and older to request care online for non-urgent symptoms. For details visit [email or web address].   Also download the MyChart app! Go to the app store, search "MyChart", open the app, select [facility], and log in with your MyChart username and password.  Due to Covid, a mask is required upon entering the hospital/clinic. If you do not have a mask, one will be given to you upon arrival. For doctor visits, patients may have 1 support person aged 35 or older with them. For treatment visits, patients cannot have anyone with them due to current Covid guidelines and our immunocompromised population.

## 2021-03-15 ENCOUNTER — Inpatient Hospital Stay: Payer: Medicare Other

## 2021-03-15 ENCOUNTER — Other Ambulatory Visit: Payer: Self-pay

## 2021-03-15 DIAGNOSIS — Z5112 Encounter for antineoplastic immunotherapy: Secondary | ICD-10-CM | POA: Diagnosis not present

## 2021-03-15 DIAGNOSIS — C159 Malignant neoplasm of esophagus, unspecified: Secondary | ICD-10-CM

## 2021-03-15 MED ORDER — PEGFILGRASTIM-CBQV 6 MG/0.6ML ~~LOC~~ SOSY
6.0000 mg | PREFILLED_SYRINGE | Freq: Once | SUBCUTANEOUS | Status: AC
  Administered 2021-03-15: 6 mg via SUBCUTANEOUS
  Filled 2021-03-15: qty 0.6

## 2021-03-15 MED ORDER — HEPARIN SOD (PORK) LOCK FLUSH 100 UNIT/ML IV SOLN
INTRAVENOUS | Status: AC
  Filled 2021-03-15: qty 5

## 2021-03-15 MED ORDER — HEPARIN SOD (PORK) LOCK FLUSH 100 UNIT/ML IV SOLN
500.0000 [IU] | Freq: Once | INTRAVENOUS | Status: AC | PRN
  Administered 2021-03-15: 500 [IU]
  Filled 2021-03-15: qty 5

## 2021-03-15 MED ORDER — SODIUM CHLORIDE 0.9% FLUSH
10.0000 mL | INTRAVENOUS | Status: DC | PRN
  Administered 2021-03-15: 10 mL
  Filled 2021-03-15: qty 10

## 2021-03-15 NOTE — Patient Instructions (Signed)
CANCER CENTER [facility]  Discharge Instructions: Thank you for choosing [facility] to provide your oncology and hematology care.  If you have a lab appointment with the Cancer Center, please go directly to the Cancer Center and check in at the registration area.  Wear comfortable clothing and clothing appropriate for easy access to any Portacath or PICC line.   We strive to give you quality time with your provider. You may need to reschedule your appointment if you arrive late (15 or more minutes).  Arriving late affects you and other patients whose appointments are after yours.  Also, if you miss three or more appointments without notifying the office, you may be dismissed from the clinic at the provider's discretion.      For prescription refill requests, have your pharmacy contact our office and allow 72 hours for refills to be completed.      To help prevent nausea and vomiting after your treatment, we encourage you to take your nausea medication as directed.  BELOW ARE SYMPTOMS THAT SHOULD BE REPORTED IMMEDIATELY: *FEVER GREATER THAN 100.4 F (38 C) OR HIGHER *CHILLS OR SWEATING *NAUSEA AND VOMITING THAT IS NOT CONTROLLED WITH YOUR NAUSEA MEDICATION *UNUSUAL SHORTNESS OF BREATH *UNUSUAL BRUISING OR BLEEDING *URINARY PROBLEMS (pain or burning when urinating, or frequent urination) *BOWEL PROBLEMS (unusual diarrhea, constipation, pain near the anus) TENDERNESS IN MOUTH AND THROAT WITH OR WITHOUT PRESENCE OF ULCERS (sore throat, sores in mouth, or a toothache) UNUSUAL RASH, SWELLING OR PAIN  UNUSUAL VAGINAL DISCHARGE OR ITCHING   Items with * indicate a potential emergency and should be followed up as soon as possible or go to the Emergency Department if any problems should occur.  Please show the CHEMOTHERAPY ALERT CARD or IMMUNOTHERAPY ALERT CARD at check-in to the Emergency Department and triage nurse.  Should you have questions after your  visit or need to cancel or reschedule your appointment, please contact CANCER CENTER [facility]  [phone] and follow the prompts.  Office hours are 8:00 a.m. to 4:30 p.m. Monday - Friday. Please note that voicemails left after 4:00 p.m. may not be returned until the following business day.  We are closed weekends and major holidays. You have access to a nurse at all times for urgent questions. Please call the main number to the clinic [phone] and follow the prompts.  For any non-urgent questions, you may also contact your provider using MyChart. We now offer e-Visits for anyone 18 and older to request care online for non-urgent symptoms. For details visit [email or web address].   Also download the MyChart app! Go to the app store, search "MyChart", open the app, select [facility], and log in with your MyChart username and password.  Due to Covid, a mask is required upon entering the hospital/clinic. If you do not have a mask, one will be given to you upon arrival. For doctor visits, patients may have 1 support person aged 18 or older with them. For treatment visits, patients cannot have anyone with them due to current Covid guidelines and our immunocompromised population.  

## 2021-03-27 ENCOUNTER — Encounter: Payer: Self-pay | Admitting: Oncology

## 2021-03-27 ENCOUNTER — Inpatient Hospital Stay (HOSPITAL_BASED_OUTPATIENT_CLINIC_OR_DEPARTMENT_OTHER): Payer: Medicare Other | Admitting: Oncology

## 2021-03-27 ENCOUNTER — Other Ambulatory Visit: Payer: Self-pay

## 2021-03-27 ENCOUNTER — Inpatient Hospital Stay: Payer: Medicare Other

## 2021-03-27 ENCOUNTER — Telehealth: Payer: Self-pay | Admitting: Internal Medicine

## 2021-03-27 VITALS — BP 135/64 | HR 67 | Temp 98.3°F | Resp 16 | Wt 201.5 lb

## 2021-03-27 DIAGNOSIS — C160 Malignant neoplasm of cardia: Secondary | ICD-10-CM | POA: Diagnosis not present

## 2021-03-27 DIAGNOSIS — D6959 Other secondary thrombocytopenia: Secondary | ICD-10-CM | POA: Diagnosis not present

## 2021-03-27 DIAGNOSIS — Z5112 Encounter for antineoplastic immunotherapy: Secondary | ICD-10-CM

## 2021-03-27 DIAGNOSIS — Z5111 Encounter for antineoplastic chemotherapy: Secondary | ICD-10-CM

## 2021-03-27 DIAGNOSIS — C159 Malignant neoplasm of esophagus, unspecified: Secondary | ICD-10-CM

## 2021-03-27 DIAGNOSIS — C799 Secondary malignant neoplasm of unspecified site: Secondary | ICD-10-CM | POA: Diagnosis not present

## 2021-03-27 DIAGNOSIS — T451X5A Adverse effect of antineoplastic and immunosuppressive drugs, initial encounter: Secondary | ICD-10-CM

## 2021-03-27 LAB — CBC WITH DIFFERENTIAL/PLATELET
Abs Immature Granulocytes: 0.05 10*3/uL (ref 0.00–0.07)
Basophils Absolute: 0 10*3/uL (ref 0.0–0.1)
Basophils Relative: 0 %
Eosinophils Absolute: 0.3 10*3/uL (ref 0.0–0.5)
Eosinophils Relative: 3 %
HCT: 37.3 % — ABNORMAL LOW (ref 39.0–52.0)
Hemoglobin: 12 g/dL — ABNORMAL LOW (ref 13.0–17.0)
Immature Granulocytes: 1 %
Lymphocytes Relative: 10 %
Lymphs Abs: 1 10*3/uL (ref 0.7–4.0)
MCH: 30.1 pg (ref 26.0–34.0)
MCHC: 32.2 g/dL (ref 30.0–36.0)
MCV: 93.5 fL (ref 80.0–100.0)
Monocytes Absolute: 0.6 10*3/uL (ref 0.1–1.0)
Monocytes Relative: 6 %
Neutro Abs: 7.6 10*3/uL (ref 1.7–7.7)
Neutrophils Relative %: 80 %
Platelets: 130 10*3/uL — ABNORMAL LOW (ref 150–400)
RBC: 3.99 MIL/uL — ABNORMAL LOW (ref 4.22–5.81)
RDW: 15.8 % — ABNORMAL HIGH (ref 11.5–15.5)
WBC: 9.6 10*3/uL (ref 4.0–10.5)
nRBC: 0 % (ref 0.0–0.2)

## 2021-03-27 LAB — COMPREHENSIVE METABOLIC PANEL
ALT: 20 U/L (ref 0–44)
AST: 22 U/L (ref 15–41)
Albumin: 3.9 g/dL (ref 3.5–5.0)
Alkaline Phosphatase: 179 U/L — ABNORMAL HIGH (ref 38–126)
Anion gap: 7 (ref 5–15)
BUN: 17 mg/dL (ref 8–23)
CO2: 25 mmol/L (ref 22–32)
Calcium: 8.7 mg/dL — ABNORMAL LOW (ref 8.9–10.3)
Chloride: 105 mmol/L (ref 98–111)
Creatinine, Ser: 0.95 mg/dL (ref 0.61–1.24)
GFR, Estimated: >60 mL/min (ref >60)
Glucose, Bld: 117 mg/dL — ABNORMAL HIGH (ref 70–99)
Potassium: 3.9 mmol/L (ref 3.5–5.1)
Sodium: 137 mmol/L (ref 135–145)
Total Bilirubin: 1.1 mg/dL (ref 0.3–1.2)
Total Protein: 6.7 g/dL (ref 6.5–8.1)

## 2021-03-27 LAB — TSH: TSH: 1.351 u[IU]/mL (ref 0.350–4.500)

## 2021-03-27 MED ORDER — LEUCOVORIN CALCIUM INJECTION 350 MG
850.0000 mg | Freq: Once | INTRAVENOUS | Status: AC
  Administered 2021-03-27: 850 mg via INTRAVENOUS
  Filled 2021-03-27: qty 42.5

## 2021-03-27 MED ORDER — DIPHENHYDRAMINE HCL 50 MG/ML IJ SOLN
50.0000 mg | Freq: Once | INTRAMUSCULAR | Status: AC
  Administered 2021-03-27: 50 mg via INTRAVENOUS
  Filled 2021-03-27: qty 1

## 2021-03-27 MED ORDER — PANTOPRAZOLE SODIUM 40 MG PO TBEC: DELAYED_RELEASE_TABLET | ORAL | 11 refills | Status: AC

## 2021-03-27 MED ORDER — TRASTUZUMAB-ANNS CHEMO 150 MG IV SOLR
4.0000 mg/kg | Freq: Once | INTRAVENOUS | Status: AC
  Administered 2021-03-27: 357 mg via INTRAVENOUS
  Filled 2021-03-27: qty 17

## 2021-03-27 MED ORDER — DEXAMETHASONE SODIUM PHOSPHATE 100 MG/10ML IJ SOLN
10.0000 mg | Freq: Once | INTRAMUSCULAR | Status: AC
  Administered 2021-03-27: 10 mg via INTRAVENOUS
  Filled 2021-03-27: qty 10

## 2021-03-27 MED ORDER — SODIUM CHLORIDE 0.9 % IV SOLN
5000.0000 mg | INTRAVENOUS | Status: DC
  Administered 2021-03-27: 5000 mg via INTRAVENOUS
  Filled 2021-03-27: qty 100

## 2021-03-27 MED ORDER — SODIUM CHLORIDE 0.9 % IV SOLN
INTRAVENOUS | Status: DC
  Filled 2021-03-27: qty 250

## 2021-03-27 MED ORDER — FLUOROURACIL CHEMO INJECTION 2.5 GM/50ML
400.0000 mg/m2 | Freq: Once | INTRAVENOUS | Status: AC
  Administered 2021-03-27: 850 mg via INTRAVENOUS
  Filled 2021-03-27: qty 17

## 2021-03-27 MED ORDER — ACETAMINOPHEN 325 MG PO TABS
650.0000 mg | ORAL_TABLET | Freq: Once | ORAL | Status: AC
  Administered 2021-03-27: 650 mg via ORAL
  Filled 2021-03-27: qty 2

## 2021-03-27 MED ORDER — PALONOSETRON HCL INJECTION 0.25 MG/5ML: 0.2500 mg | Freq: Once | INTRAVENOUS | Status: DC

## 2021-03-27 NOTE — Telephone Encounter (Signed)
Pt called about medication dosage. Pt requesting call bk for advice.

## 2021-03-27 NOTE — Progress Notes (Signed)
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  Patient receiving 5-FU, trastuzumab and keytruda.    Met with patient in infusion.  Patient with traumatic fracture of right proximal humerus, currently in a sling.  No plans for surgery.  Say that he has a good appetite.  Church and daughter are helping prepare meals for him and his wife.  Continues to drink boost shakes.      Medications: reviewed  Labs: reviewed  Anthropometrics:   201 lb today  194 lb 3.2 oz on 8/9 188 lb on 8/2 197 lb on 7/12 195 lb on 6/28 191 lb on 6/14 194 lb on 5/31 197 lb on 5/3 230 lb on Jan 2021   NUTRITION DIAGNOSIS: Inadequate oral intake improved   INTERVENTION:  Continue well balanced diet including good sources of protein     MONITORING, EVALUATION, GOAL: weight trends, intake   NEXT VISIT: as needed  Rafferty Postlewait B. Zenia Resides, [place], [place] Registered Dietitian [phone] (mobile)

## 2021-03-27 NOTE — Progress Notes (Signed)
Pt in for follow up, having pain in right shoulder from previous injury.  States started PT yesterday and it is hurting more.

## 2021-03-27 NOTE — Patient Instructions (Signed)
[facility]  Discharge Instructions: Thank you for choosing [facility] to provide your oncology and hematology care.  If you have a lab appointment with the [facility], please go directly to the [facility] and check in at the registration area.  Wear comfortable clothing and clothing appropriate for easy access to any Portacath or PICC line.   We strive to give you quality time with your provider. You may need to reschedule your appointment if you arrive late (15 or more minutes).  Arriving late affects you and other patients whose appointments are after yours.  Also, if you miss three or more appointments without notifying the office, you may be dismissed from the clinic at the provider's discretion.      For prescription refill requests, have your pharmacy contact our office and allow 72 hours for refills to be completed.    Today you received the following chemotherapy and/or immunotherapy agents : Herceptin / Leucovorin / 5FU   To help prevent nausea and vomiting after your treatment, we encourage you to take your nausea medication as directed.  BELOW ARE SYMPTOMS THAT SHOULD BE REPORTED IMMEDIATELY: *FEVER GREATER THAN 100.4 F (38 C) OR HIGHER *CHILLS OR SWEATING *NAUSEA AND VOMITING THAT IS NOT CONTROLLED WITH YOUR NAUSEA MEDICATION *UNUSUAL SHORTNESS OF BREATH *UNUSUAL BRUISING OR BLEEDING *URINARY PROBLEMS (pain or burning when urinating, or frequent urination) *BOWEL PROBLEMS (unusual diarrhea, constipation, pain near the anus) TENDERNESS IN MOUTH AND THROAT WITH OR WITHOUT PRESENCE OF ULCERS (sore throat, sores in mouth, or a toothache) UNUSUAL RASH, SWELLING OR PAIN  UNUSUAL VAGINAL DISCHARGE OR ITCHING   Items with * indicate a potential emergency and should be followed up as soon as possible or go to the Emergency Department if any problems should occur.  Please show the CHEMOTHERAPY ALERT CARD or IMMUNOTHERAPY ALERT  CARD at check-in to the Emergency Department and triage nurse.  Should you have questions after your visit or need to cancel or reschedule your appointment, please contact [facility]  [phone] and follow the prompts.  Office hours are 8:00 a.m. to 4:30 p.m. Monday - Friday. Please note that voicemails left after 4:00 p.m. may not be returned until the following business day.  We are closed weekends and major holidays. You have access to a nurse at all times for urgent questions. Please call the main number to the clinic [phone] and follow the prompts.  For any non-urgent questions, you may also contact your provider using MyChart. We now offer e-Visits for anyone 92 and older to request care online for non-urgent symptoms. For details visit [email or web address].   Also download the MyChart app! Go to the app store, search "MyChart", open the app, select [facility], and log in with your MyChart username and password.  Due to Covid, a mask is required upon entering the hospital/clinic. If you do not have a mask, one will be given to you upon arrival. For doctor visits, patients may have 1 support person aged 79 or older with them. For treatment visits, patients cannot have anyone with them due to current Covid guidelines and our immunocompromised population.

## 2021-03-27 NOTE — Telephone Encounter (Signed)
Spoke to pt. He states that the last rx he got from the pharmacy states take 1 by mouth daily. I advised him the rx done 10-28-20 was for twice daily. The mistake is with the pharmacy. I have sent a new refill for a year.

## 2021-03-27 NOTE — Progress Notes (Signed)
Hematology/Oncology Consult note [facility]  [phone] [phone]  Patient Care Team: Venia Carbon, MD as PCP - General [name], RN as Oncology Nurse Navigator [name], MD as Consulting Physician (Hematology and Oncology)   Name of the patient: Cory [name]  [id number]  1955/08/17   Date of visit: 03/27/21  Diagnosis- metastatic esophageal cancer with liver metastases  Chief complaint/ Reason for visit-on treatment assessment prior to cycle 20 of 5-FU trastuzumab chemotherapy  Heme/Onc history: Patient is a 65 year old male who presented with symptoms of indigestion and heartburn which gradually progressed to dysphagia.  He underwent EGD on 06/20/2020 By Dr. Vicente Males which showed a large nearly obstructing mass in the lower third of the esophagus 35 cm from incisors.  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  PD-l1 <1 %, TMB HIGH    Presently patient reports dysphagia but is able to eat cereal or soft food.  He has lost about 13 pounds in the last month itself.  Denies any significant pain.  He recently retired after many years of service and is otherwise independent of his ADLs and IADLs.     PET CT scan on 06/26/2020 showed circumferential distal esophageal mass with an SUV of 13.3 extending over 5.5 cm. Right lower paratracheal node is 0.6 cm with an SUV of 3.1. Hypermetabolic right gastric lymph nodes including 1.1 cm node with an SUV of 5.8. Numerous hypermetabolic lesions throughout the liver somewhat confluent around the periphery which were hypermetabolic between 8 and 9. Faintly hypermetabolic left adrenal gland   NGS testing showed high tumor mutational burden CPS score less than 1. CD9-NRG1 fusion. BRAF 581L, CCN E1 gain.  EMLA for fusion of BX [id number], FGF 23 gain, FGF 6 gain, MET R988C, T p53 S1 64F. ERBB2 gain but no other actionable mutations   Patient currently on  first-line FOLFOX/trastuzumab/Keytruda.  He gets Keytruda every 6 weeks    Interval history-patient continues to have a sling in place over his right upper extremity fracture which he tells me is healing well.  Otherwise doing well and tolerating treatment without any significant side effects.  Appetite and weight have remained stable.  Denies any dysphagia  ECOG PS-1 Pain scale- 0   Review of systems- Review of Systems  Constitutional:  Negative for chills, fever, malaise/fatigue and weight loss.  HENT:  Negative for congestion, ear discharge and nosebleeds.   Eyes:  Negative for blurred vision.  Respiratory:  Negative for cough, hemoptysis, sputum production, shortness of breath and wheezing.   Cardiovascular:  Negative for chest pain, palpitations, orthopnea and claudication.  Gastrointestinal:  Negative for abdominal pain, blood in stool, constipation, diarrhea, heartburn, melena, nausea and vomiting.  Genitourinary:  Negative for dysuria, flank pain, frequency, hematuria and urgency.  Musculoskeletal:  Negative for back pain, joint pain and myalgias.  Skin:  Negative for rash.  Neurological:  Negative for dizziness, tingling, focal weakness, seizures, weakness and headaches.  Endo/Heme/Allergies:  Does not bruise/bleed easily.  Psychiatric/Behavioral:  Negative for depression and suicidal ideas. The patient does not have insomnia.      No Known Allergies   Past Medical History:  Diagnosis Date   Esophageal adenocarcinoma ([facility]) 06/23/2020   Family history of brain cancer    Family history of colon cancer    Family history of melanoma    Family history of stomach cancer    GERD (gastroesophageal reflux disease)    History of  kidney stones    Nephrolithiasis    [facility]   Pancreatitis, acute    Personal history of colonic polyps    Dr Nicolasa Ducking   Psoriasis      Past Surgical History:  Procedure Laterality Date   COLONOSCOPY     COLONOSCOPY WITH PROPOFOL N/A 03/31/2020    Procedure: COLONOSCOPY WITH PROPOFOL;  Surgeon: Jonathon Bellows, MD;  Location: [facility] ENDOSCOPY;  Service: Gastroenterology;  Laterality: N/A;   ERCP     ESOPHAGOGASTRODUODENOSCOPY (EGD) WITH PROPOFOL N/A 06/19/2020   Procedure: ESOPHAGOGASTRODUODENOSCOPY (EGD) WITH PROPOFOL;  Surgeon: Jonathon Bellows, MD;  Location: [facility] ENDOSCOPY;  Service: Gastroenterology;  Laterality: N/A;   groin surgery     as a child   PORTA CATH INSERTION N/A 06/28/2020   Procedure: PORTA CATH INSERTION;  Surgeon: Algernon Huxley, MD;  Location: [facility] CV LAB;  Service: Cardiovascular;  Laterality: N/A;   SHOULDER SURGERY      Social History   Socioeconomic History   Marital status: Married    Spouse name: Not on file   Number of children: 2   Years of education: Not on file   Highest education level: Not on file  Occupational History   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  Tobacco Use   Smoking status: Former    Types: Cigarettes    Quit date: 07/08/1988    Years since quitting: 32.7   Smokeless tobacco: Never  Vaping Use   Vaping Use: Never used  Substance and Sexual Activity   Alcohol use: No    Comment: recovering alcoholic for 13 years   Drug use: No   Sexual activity: Not Currently  Other Topics Concern   Not on file  Social History Narrative   Has living will--needs to get notarized   Daughter should be health care POA (wife with dementia)   Would accept resuscitation   Would accept tube feeds depending on the circumstance   Social Determinants of Health   Financial Resource Strain: Not on file  Food Insecurity: Not on file  Transportation Needs: Not on file  Physical Activity: Not on file  Stress: Not on file  Social Connections: Not on file  Intimate Partner Violence: Not on file    Family History  Problem Relation Age of Onset   Stomach cancer Mother    Diabetes Brother    Hypertension Brother    Hypertension Father    Prostate cancer Father    Colon  cancer Sister    Cancer Other        either cervical or ovarian, d. 59s   Brain cancer Sister      Current Outpatient Medications:    acetaminophen (TYLENOL) 325 MG tablet, Take 650 mg by mouth every 6 (six) hours as needed., Disp: , Rfl:    dexamethasone (DECADRON) 4 MG tablet, Take 2 tablets (8 mg total) by mouth daily. Start the day after chemotherapy for 2 days. Take with food., Disp: 30 tablet, Rfl: 1   lidocaine-prilocaine (EMLA) cream, Apply 1 application topically as needed. Apply small amount to port site at least 1 hour prior to it being accessed, cover with plastic wrap, Disp: 30 g, Rfl: 3   oxyCODONE (OXY IR/ROXICODONE) 5 MG immediate release tablet, Take 1 tablet (5 mg total) by mouth every 8 (eight) hours as needed for severe pain., Disp: 60 tablet, Rfl: 0   pantoprazole (PROTONIX) 40 MG tablet, TAKE 1 TABLET BY MOUTH TWICE A DAY BEFORE MEALS,  Disp: 60 tablet, Rfl: 11   triamcinolone cream (KENALOG) 0.1 %, Apply 1 application topically 3 (three) times daily as needed., Disp: 300 each, Rfl: 1   LORazepam (ATIVAN) 0.5 MG tablet, Take 1 tablet (0.5 mg total) by mouth every 6 (six) hours as needed (Nausea or vomiting). (Patient not taking: No sig reported), Disp: 30 tablet, Rfl: 0   ondansetron (ZOFRAN) 8 MG tablet, Take 1 tablet (8 mg total) by mouth 2 (two) times daily as needed for refractory nausea / vomiting. Start on day 3 after chemotherapy. (Patient not taking: No sig reported), Disp: 30 tablet, Rfl: 1   oxyCODONE-acetaminophen (PERCOCET/ROXICET) 5-325 MG tablet, Take 1 tablet by mouth every 6 (six) hours as needed for severe pain. (Patient not taking: Reported on 03/27/2021), Disp: 20 tablet, Rfl: 0   prochlorperazine (COMPAZINE) 10 MG tablet, Take 1 tablet (10 mg total) by mouth every 6 (six) hours as needed (Nausea or vomiting). (Patient not taking: No sig reported), Disp: 30 tablet, Rfl: 1 No current facility-administered medications for this visit.  Facility-Administered  Medications Ordered in Other Visits:    0.9 %  sodium chloride infusion, , Intravenous, Continuous, [name], MD, Stopped at 03/27/21 1353   fluorouracil (ADRUCIL) 5,000 mg in sodium chloride 0.9 % 150 mL chemo infusion, 5,000 mg, Intravenous, 1 day or 1 dose, [name], MD, 5,000 mg at 03/27/21 1356   sodium chloride flush (NS) 0.9 % injection 10 mL, 10 mL, Intracatheter, PRN, [name], MD, 10 mL at 01/04/21 1149  Physical exam:  Vitals:   03/27/21 1059  BP: 135/64  Pulse: 67  Resp: 16  Temp: 98.3 F (36.8 C)  TempSrc: Tympanic  SpO2: 97%  Weight: 201 lb 8 oz (91.4 kg)   Physical Exam Constitutional:      General: He is not in acute distress. Cardiovascular:     Rate and Rhythm: Normal rate and regular rhythm.     Heart sounds: Normal heart sounds.  Pulmonary:     Effort: Pulmonary effort is normal.     Breath sounds: Normal breath sounds.  Musculoskeletal:     Comments: Sling in place over right upper extremity  Skin:    General: Skin is warm and dry.  Neurological:     Mental Status: He is alert and oriented to person, place, and time.     CMP Latest Ref Rng & Units 03/27/2021  Glucose 70 - 99 mg/dL 117(H)  BUN 8 - 23 mg/dL 17  Creatinine 0.61 - 1.24 mg/dL 0.95  Sodium 135 - 145 mmol/L 137  Potassium 3.5 - 5.1 mmol/L 3.9  Chloride 98 - 111 mmol/L 105  CO2 22 - 32 mmol/L 25  Calcium 8.9 - 10.3 mg/dL 8.7(L)  Total Protein 6.5 - 8.1 g/dL 6.7  Total Bilirubin 0.3 - 1.2 mg/dL 1.1  Alkaline Phos 38 - 126 U/L 179(H)  AST 15 - 41 U/L 22  ALT 0 - 44 U/L 20   CBC Latest Ref Rng & Units 03/27/2021  WBC 4.0 - 10.5 K/uL 9.6  Hemoglobin 13.0 - 17.0 g/dL 12.0(L)  Hematocrit 39.0 - 52.0 % 37.3(L)  Platelets 150 - 400 K/uL 130(L)    No images are attached to the encounter.  DG Shoulder Right  Result Date: 02/28/2021 CLINICAL DATA:  Right shoulder pain following fall, initial encounter EXAM: RIGHT SHOULDER - 2+ VIEW COMPARISON:  None. FINDINGS: There is an  oblique fracture noted through the proximal right humeral shaft just below the surgical  neck. The humeral head appears somewhat high-riding with remodeling of the acromion likely related to underlying chronic rotator cuff injury. No other fracture is seen. Degenerative changes of the acromioclavicular joint are noted. Underlying bony thorax appears within normal limits. IMPRESSION: Oblique proximal right humeral fracture just below the surgical neck. Degenerative changes about the shoulder joint as described. Electronically Signed   By: Inez Catalina M.D.   On: 02/28/2021 21:26     Assessment and plan- Patient is a 65 y.o. male with history of adenocarcinoma of the esophagus with liver metastases.  He is here for on treatment assessment prior to cycle 20 of 5-FU trastuzumab chemotherapy  Counts okay to proceed with cycle 20 of 5-FU trastuzumab chemotherapy today.  CT chest abdomen pelvis and contrast to be done in 2 weeks and I will see him back in 3 weeks to discuss CT scan results and further management and potentially for cycle 21 of 5-FU trastuzumab chemotherapy.However his CEA has been slowly trending up over the last 2 months which is concerning.  CEA is up to 11 a month ago from a lowest value of 5.2 in June 2022.  Value from today is pending.  If she has evidence of disease progression on his CT scan 1 option would be for him to consider going to [facility] to see if he would be potentially eligible for phase 2 clinical trial in which she is looking at seribantumab for patients with advanced solid tumors with NRG 1 gene fusion which he has.  We would be unable to obtain these drugs outside of a clinical trial.  Chemo induced thrombocytopenia: Mild continue to monitor   Visit Diagnosis 1. Esophageal adenocarcinoma ([facility])   2. 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 Encounter for monoclonal antibody treatment for malignancy   4. Encounter for antineoplastic  chemotherapy   5. Chemotherapy-induced thrombocytopenia      Dr. Randa Evens, MD, MPH [facility] [phone] 03/27/2021 4:49 PM

## 2021-03-28 LAB — CEA: CEA: 26.3 ng/mL — ABNORMAL HIGH (ref 0.0–4.7)

## 2021-03-29 ENCOUNTER — Inpatient Hospital Stay: Payer: Medicare Other

## 2021-03-29 DIAGNOSIS — C159 Malignant neoplasm of esophagus, unspecified: Secondary | ICD-10-CM

## 2021-03-29 DIAGNOSIS — Z5112 Encounter for antineoplastic immunotherapy: Secondary | ICD-10-CM | POA: Diagnosis not present

## 2021-03-29 MED ORDER — PEGFILGRASTIM-CBQV 6 MG/0.6ML ~~LOC~~ SOSY
6.0000 mg | PREFILLED_SYRINGE | Freq: Once | SUBCUTANEOUS | Status: AC
  Administered 2021-03-29: 6 mg via SUBCUTANEOUS
  Filled 2021-03-29: qty 0.6

## 2021-03-29 MED ORDER — SODIUM CHLORIDE 0.9% FLUSH
10.0000 mL | INTRAVENOUS | Status: DC | PRN
  Administered 2021-03-29: 10 mL
  Filled 2021-03-29: qty 10

## 2021-03-29 MED ORDER — HEPARIN SOD (PORK) LOCK FLUSH 100 UNIT/ML IV SOLN
INTRAVENOUS | Status: AC
  Filled 2021-03-29: qty 5

## 2021-03-29 MED ORDER — HEPARIN SOD (PORK) LOCK FLUSH 100 UNIT/ML IV SOLN
500.0000 [IU] | Freq: Once | INTRAVENOUS | Status: AC | PRN
  Administered 2021-03-29: 500 [IU]
  Filled 2021-03-29: qty 5

## 2021-03-29 NOTE — Patient Instructions (Signed)

## 2021-04-03 ENCOUNTER — Telehealth: Payer: Self-pay

## 2021-04-03 NOTE — Telephone Encounter (Signed)
Thanks [employer]

## 2021-04-03 NOTE — Telephone Encounter (Signed)
In regards to clinical trial [id number], per Maegan at [facility],   "the [place] study is active but on a brief enrollment pause due to a temporary supply challenge.  It would be best to connect with the team at [facility] who will be able to keep you updated on future slot availability"  Voicemail left with Jeanmarie Hubert at [facility] to inquire regarding future slot availability. [phone]

## 2021-04-11 ENCOUNTER — Ambulatory Visit: Payer: Medicare Other | Admitting: Oncology

## 2021-04-11 ENCOUNTER — Ambulatory Visit: Payer: Medicare Other

## 2021-04-11 ENCOUNTER — Other Ambulatory Visit: Payer: Medicare Other

## 2021-04-16 ENCOUNTER — Ambulatory Visit
Admission: RE | Admit: 2021-04-16 | Discharge: 2021-04-16 | Disposition: A | Discharge status: Home / Self Care | Payer: Medicare Other | Source: Ambulatory Visit | Attending: Oncology | Admitting: Oncology

## 2021-04-16 ENCOUNTER — Other Ambulatory Visit: Payer: Self-pay | Admitting: Oncology

## 2021-04-16 ENCOUNTER — Other Ambulatory Visit: Payer: Self-pay

## 2021-04-16 DIAGNOSIS — C799 Secondary malignant neoplasm of unspecified site: Secondary | ICD-10-CM | POA: Diagnosis not present

## 2021-04-16 DIAGNOSIS — C160 Malignant neoplasm of cardia: Secondary | ICD-10-CM | POA: Diagnosis present

## 2021-04-16 MED ORDER — IOHEXOL 350 MG/ML SOLN
100.0000 mL | Freq: Once | INTRAVENOUS | Status: AC | PRN
  Administered 2021-04-16: 100 mL via INTRAVENOUS

## 2021-04-17 ENCOUNTER — Inpatient Hospital Stay: Payer: Medicare Other

## 2021-04-17 ENCOUNTER — Encounter: Payer: Self-pay | Admitting: Oncology

## 2021-04-17 ENCOUNTER — Inpatient Hospital Stay: Payer: Medicare Other | Attending: Oncology

## 2021-04-17 ENCOUNTER — Inpatient Hospital Stay (HOSPITAL_BASED_OUTPATIENT_CLINIC_OR_DEPARTMENT_OTHER): Payer: Medicare Other | Admitting: Oncology

## 2021-04-17 VITALS — BP 146/70 | HR 75 | Temp 97.9°F | Resp 18 | Wt 206.2 lb

## 2021-04-17 DIAGNOSIS — I700 Atherosclerosis of aorta: Secondary | ICD-10-CM | POA: Insufficient documentation

## 2021-04-17 DIAGNOSIS — N200 Calculus of kidney: Secondary | ICD-10-CM | POA: Diagnosis not present

## 2021-04-17 DIAGNOSIS — Z8249 Family history of ischemic heart disease and other diseases of the circulatory system: Secondary | ICD-10-CM | POA: Diagnosis not present

## 2021-04-17 DIAGNOSIS — Z5111 Encounter for antineoplastic chemotherapy: Secondary | ICD-10-CM

## 2021-04-17 DIAGNOSIS — Z5112 Encounter for antineoplastic immunotherapy: Secondary | ICD-10-CM | POA: Insufficient documentation

## 2021-04-17 DIAGNOSIS — Z800 Family history of malignant neoplasm of digestive organs: Secondary | ICD-10-CM | POA: Diagnosis not present

## 2021-04-17 DIAGNOSIS — C159 Malignant neoplasm of esophagus, unspecified: Secondary | ICD-10-CM

## 2021-04-17 DIAGNOSIS — Z8042 Family history of malignant neoplasm of prostate: Secondary | ICD-10-CM | POA: Insufficient documentation

## 2021-04-17 DIAGNOSIS — Z808 Family history of malignant neoplasm of other organs or systems: Secondary | ICD-10-CM | POA: Diagnosis not present

## 2021-04-17 DIAGNOSIS — Z87891 Personal history of nicotine dependence: Secondary | ICD-10-CM | POA: Diagnosis not present

## 2021-04-17 DIAGNOSIS — R161 Splenomegaly, not elsewhere classified: Secondary | ICD-10-CM | POA: Diagnosis not present

## 2021-04-17 DIAGNOSIS — Z833 Family history of diabetes mellitus: Secondary | ICD-10-CM | POA: Insufficient documentation

## 2021-04-17 DIAGNOSIS — C787 Secondary malignant neoplasm of liver and intrahepatic bile duct: Secondary | ICD-10-CM | POA: Diagnosis not present

## 2021-04-17 DIAGNOSIS — Z5189 Encounter for other specified aftercare: Secondary | ICD-10-CM | POA: Diagnosis not present

## 2021-04-17 DIAGNOSIS — Z79899 Other long term (current) drug therapy: Secondary | ICD-10-CM | POA: Diagnosis not present

## 2021-04-17 DIAGNOSIS — J439 Emphysema, unspecified: Secondary | ICD-10-CM | POA: Diagnosis not present

## 2021-04-17 DIAGNOSIS — Z809 Family history of malignant neoplasm, unspecified: Secondary | ICD-10-CM | POA: Diagnosis not present

## 2021-04-17 DIAGNOSIS — C155 Malignant neoplasm of lower third of esophagus: Secondary | ICD-10-CM | POA: Diagnosis present

## 2021-04-17 LAB — CBC WITH DIFFERENTIAL/PLATELET
Abs Immature Granulocytes: 0.03 10*3/uL (ref 0.00–0.07)
Basophils Absolute: 0 10*3/uL (ref 0.0–0.1)
Basophils Relative: 0 %
Eosinophils Absolute: 0.3 10*3/uL (ref 0.0–0.5)
Eosinophils Relative: 4 %
HCT: 35.7 % — ABNORMAL LOW (ref 39.0–52.0)
Hemoglobin: 11.5 g/dL — ABNORMAL LOW (ref 13.0–17.0)
Immature Granulocytes: 0 %
Lymphocytes Relative: 12 %
Lymphs Abs: 0.9 10*3/uL (ref 0.7–4.0)
MCH: 30 pg (ref 26.0–34.0)
MCHC: 32.2 g/dL (ref 30.0–36.0)
MCV: 93.2 fL (ref 80.0–100.0)
Monocytes Absolute: 0.6 10*3/uL (ref 0.1–1.0)
Monocytes Relative: 8 %
Neutro Abs: 5.7 10*3/uL (ref 1.7–7.7)
Neutrophils Relative %: 76 %
Platelets: 150 10*3/uL (ref 150–400)
RBC: 3.83 MIL/uL — ABNORMAL LOW (ref 4.22–5.81)
RDW: 15.8 % — ABNORMAL HIGH (ref 11.5–15.5)
WBC: 7.6 10*3/uL (ref 4.0–10.5)
nRBC: 0 % (ref 0.0–0.2)

## 2021-04-17 LAB — COMPREHENSIVE METABOLIC PANEL
ALT: 16 U/L (ref 0–44)
AST: 24 U/L (ref 15–41)
Albumin: 3.8 g/dL (ref 3.5–5.0)
Alkaline Phosphatase: 169 U/L — ABNORMAL HIGH (ref 38–126)
Anion gap: 7 (ref 5–15)
BUN: 12 mg/dL (ref 8–23)
CO2: 26 mmol/L (ref 22–32)
Calcium: 8.9 mg/dL (ref 8.9–10.3)
Chloride: 103 mmol/L (ref 98–111)
Creatinine, Ser: 0.79 mg/dL (ref 0.61–1.24)
GFR, Estimated: >60 mL/min (ref >60)
Glucose, Bld: 143 mg/dL — ABNORMAL HIGH (ref 70–99)
Potassium: 3.8 mmol/L (ref 3.5–5.1)
Sodium: 136 mmol/L (ref 135–145)
Total Bilirubin: 0.8 mg/dL (ref 0.3–1.2)
Total Protein: 6.7 g/dL (ref 6.5–8.1)

## 2021-04-17 MED ORDER — FLUOROURACIL CHEMO INJECTION 2.5 GM/50ML
400.0000 mg/m2 | Freq: Once | INTRAVENOUS | Status: AC
  Administered 2021-04-17: 850 mg via INTRAVENOUS
  Filled 2021-04-17: qty 17

## 2021-04-17 MED ORDER — FLUOROURACIL CHEMO INJECTION 5 GM/100ML
5000.0000 mg | INTRAVENOUS | Status: DC
  Administered 2021-04-17: 5000 mg via INTRAVENOUS
  Filled 2021-04-17: qty 100

## 2021-04-17 MED ORDER — SODIUM CHLORIDE 0.9 % IV SOLN
10.0000 mg | Freq: Once | INTRAVENOUS | Status: AC
  Administered 2021-04-17: 10 mg via INTRAVENOUS
  Filled 2021-04-17: qty 10

## 2021-04-17 MED ORDER — ACETAMINOPHEN 325 MG PO TABS
650.0000 mg | ORAL_TABLET | Freq: Once | ORAL | Status: AC
  Administered 2021-04-17: 650 mg via ORAL
  Filled 2021-04-17: qty 2

## 2021-04-17 MED ORDER — TRASTUZUMAB-ANNS CHEMO 150 MG IV SOLR
4.0000 mg/kg | Freq: Once | INTRAVENOUS | Status: AC
  Administered 2021-04-17: 357 mg via INTRAVENOUS
  Filled 2021-04-17: qty 17

## 2021-04-17 MED ORDER — PALONOSETRON HCL INJECTION 0.25 MG/5ML
0.2500 mg | Freq: Once | INTRAVENOUS | Status: AC
  Administered 2021-04-17: 0.25 mg via INTRAVENOUS
  Filled 2021-04-17: qty 5

## 2021-04-17 MED ORDER — SODIUM CHLORIDE 0.9 % IV SOLN
Freq: Once | INTRAVENOUS | Status: AC
  Filled 2021-04-17: qty 250

## 2021-04-17 MED ORDER — SODIUM CHLORIDE 0.9% FLUSH
10.0000 mL | Freq: Once | INTRAVENOUS | Status: DC
  Filled 2021-04-17: qty 10

## 2021-04-17 MED ORDER — HEPARIN SOD (PORK) LOCK FLUSH 100 UNIT/ML IV SOLN
500.0000 [IU] | Freq: Once | INTRAVENOUS | Status: DC
  Filled 2021-04-17: qty 5

## 2021-04-17 MED ORDER — SODIUM CHLORIDE 0.9 % IV SOLN
850.0000 mg | Freq: Once | INTRAVENOUS | Status: AC
  Administered 2021-04-17: 850 mg via INTRAVENOUS
  Filled 2021-04-17: qty 42.5

## 2021-04-17 MED ORDER — DIPHENHYDRAMINE HCL 50 MG/ML IJ SOLN
50.0000 mg | Freq: Once | INTRAMUSCULAR | Status: AC
  Administered 2021-04-17: 50 mg via INTRAVENOUS
  Filled 2021-04-17: qty 1

## 2021-04-17 NOTE — Progress Notes (Signed)
Hematology/Oncology Consult note [facility]  [phone] [phone]  Patient Care Team: Venia Carbon, MD as PCP - General [name], RN as Oncology Nurse Navigator [name], MD as Consulting Physician (Hematology and Oncology)   Name of the patient: Cory [name]  [id number]  01/03/1956   Date of visit: 04/17/21  Diagnosis- metastatic esophageal cancer with liver metastases  Chief complaint/ Reason for visit-on treatment assessment prior to cycle 21 of 5-FU trastuzumab chemotherapy and discuss CT scan results and further management  Heme/Onc history: Patient is a 65 year old male who presented with symptoms of indigestion and heartburn which gradually progressed to dysphagia.  He underwent EGD on 06/20/2020 By Dr. Vicente Males which showed a large nearly obstructing mass in the lower third of the esophagus 35 cm from incisors.  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  PD-l1 <1 %, TMB HIGH    Presently patient reports dysphagia but is able to eat cereal or soft food.  He has lost about 13 pounds in the last month itself.  Denies any significant pain.  He recently retired after many years of service and is otherwise independent of his ADLs and IADLs.     PET CT scan on 06/26/2020 showed circumferential distal esophageal mass with an SUV of 13.3 extending over 5.5 cm. Right lower paratracheal node is 0.6 cm with an SUV of 3.1. Hypermetabolic right gastric lymph nodes including 1.1 cm node with an SUV of 5.8. Numerous hypermetabolic lesions throughout the liver somewhat confluent around the periphery which were hypermetabolic between 8 and 9. Faintly hypermetabolic left adrenal gland   NGS testing showed high tumor mutational burden CPS score less than 1. CD9-NRG1 fusion. BRAF 581L, CCN E1 gain.  EMLA for fusion of BX [id number], FGF 23 gain, FGF 6 gain, MET R988C, T p53 S1 70F. ERBB2 gain but no other  actionable mutations   Patient currently on first-line FOLFOX/trastuzumab/Keytruda.  He gets Keytruda every 6 weeks  Interval history-right arm is still in cast.  Reports noticing black spots in front of his eyes for the last week or so.  Denies any focal numbness or weakness.  Denies any headaches  ECOG PS- 1 Pain scale- 0 Opioid associated constipation- no  Review of systems- Review of Systems  Constitutional:  Negative for chills, fever, malaise/fatigue and weight loss.  HENT:  Negative for congestion, ear discharge and nosebleeds.   Eyes:  Negative for blurred vision.  Respiratory:  Negative for cough, hemoptysis, sputum production, shortness of breath and wheezing.   Cardiovascular:  Negative for chest pain, palpitations, orthopnea and claudication.  Gastrointestinal:  Negative for abdominal pain, blood in stool, constipation, diarrhea, heartburn, melena, nausea and vomiting.  Genitourinary:  Negative for dysuria, flank pain, frequency, hematuria and urgency.  Musculoskeletal:  Negative for back pain, joint pain and myalgias.  Skin:  Negative for rash.  Neurological:  Negative for dizziness, tingling, focal weakness, seizures, weakness and headaches.  Endo/Heme/Allergies:  Does not bruise/bleed easily.  Psychiatric/Behavioral:  Negative for depression and suicidal ideas. The patient does not have insomnia.     No Known Allergies   Past Medical History:  Diagnosis Date   Esophageal adenocarcinoma ([place]) 06/23/2020   Family history of brain cancer    Family history of colon cancer    Family history of melanoma    Family history of stomach cancer    GERD (gastroesophageal reflux disease)    History of kidney stones  Nephrolithiasis    [facility]   Pancreatitis, acute    Personal history of colonic polyps    Dr Nicolasa Ducking   Psoriasis      Past Surgical History:  Procedure Laterality Date   COLONOSCOPY     COLONOSCOPY WITH PROPOFOL N/A 03/31/2020   Procedure: COLONOSCOPY  WITH PROPOFOL;  Surgeon: Jonathon Bellows, MD;  Location: [facility] ENDOSCOPY;  Service: Gastroenterology;  Laterality: N/A;   ERCP     ESOPHAGOGASTRODUODENOSCOPY (EGD) WITH PROPOFOL N/A 06/19/2020   Procedure: ESOPHAGOGASTRODUODENOSCOPY (EGD) WITH PROPOFOL;  Surgeon: Jonathon Bellows, MD;  Location: [facility] ENDOSCOPY;  Service: Gastroenterology;  Laterality: N/A;   groin surgery     as a child   PORTA CATH INSERTION N/A 06/28/2020   Procedure: PORTA CATH INSERTION;  Surgeon: Algernon Huxley, MD;  Location: [facility];  Service: Cardiovascular;  Laterality: N/A;   SHOULDER SURGERY      Social History   Socioeconomic History   Marital status: Married    Spouse name: Not on file   Number of children: 2   Years of education: Not on file   Highest education level: Not on file  Occupational History   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  Tobacco Use   Smoking status: Former    Types: Cigarettes    Quit date: 07/08/1988    Years since quitting: 32.7   Smokeless tobacco: Never  Vaping Use   Vaping Use: Never used  Substance and Sexual Activity   Alcohol use: No    Comment: recovering alcoholic for 13 years   Drug use: No   Sexual activity: Not Currently  Other Topics Concern   Not on file  Social History Narrative   Has living will--needs to get notarized   Daughter should be health care POA (wife with dementia)   Would accept resuscitation   Would accept tube feeds depending on the circumstance   Social Determinants of Health   Financial Resource Strain: Not on file  Food Insecurity: Not on file  Transportation Needs: Not on file  Physical Activity: Not on file  Stress: Not on file  Social Connections: Not on file  Intimate Partner Violence: Not on file    Family History  Problem Relation Age of Onset   Stomach cancer Mother    Diabetes Brother    Hypertension Brother    Hypertension Father    Prostate cancer Father    Colon cancer Sister    Cancer  Other        either cervical or ovarian, d. 14s   Brain cancer Sister      Current Outpatient Medications:    acetaminophen (TYLENOL) 325 MG tablet, Take 650 mg by mouth every 6 (six) hours as needed., Disp: , Rfl:    dexamethasone (DECADRON) 4 MG tablet, Take 2 tablets (8 mg total) by mouth daily. Start the day after chemotherapy for 2 days. Take with food., Disp: 30 tablet, Rfl: 1   lidocaine-prilocaine (EMLA) cream, Apply 1 application topically as needed. Apply small amount to port site at least 1 hour prior to it being accessed, cover with plastic wrap, Disp: 30 g, Rfl: 3   oxyCODONE (OXY IR/ROXICODONE) 5 MG immediate release tablet, TAKE 1 TABLET BY MOUTH EVERY 8 HOURS AS NEEDED FOR SEVERE PAIN, Disp: 60 tablet, Rfl: 0   pantoprazole (PROTONIX) 40 MG tablet, TAKE 1 TABLET BY MOUTH TWICE A DAY BEFORE MEALS, Disp: 60 tablet, Rfl: 11   triamcinolone cream (  KENALOG) 0.1 %, Apply 1 application topically 3 (three) times daily as needed., Disp: 300 each, Rfl: 1   LORazepam (ATIVAN) 0.5 MG tablet, Take 1 tablet (0.5 mg total) by mouth every 6 (six) hours as needed (Nausea or vomiting). (Patient not taking: No sig reported), Disp: 30 tablet, Rfl: 0   ondansetron (ZOFRAN) 8 MG tablet, Take 1 tablet (8 mg total) by mouth 2 (two) times daily as needed for refractory nausea / vomiting. Start on day 3 after chemotherapy. (Patient not taking: No sig reported), Disp: 30 tablet, Rfl: 1   oxyCODONE-acetaminophen (PERCOCET/ROXICET) 5-325 MG tablet, Take 1 tablet by mouth every 6 (six) hours as needed for severe pain. (Patient not taking: No sig reported), Disp: 20 tablet, Rfl: 0   prochlorperazine (COMPAZINE) 10 MG tablet, Take 1 tablet (10 mg total) by mouth every 6 (six) hours as needed (Nausea or vomiting). (Patient not taking: No sig reported), Disp: 30 tablet, Rfl: 1 No current facility-administered medications for this visit.  Facility-Administered Medications Ordered in Other Visits:    fluorouracil  (ADRUCIL) 5,000 mg in sodium chloride 0.9 % 150 mL chemo infusion, 5,000 mg, Intravenous, 1 day or 1 dose, [name], MD, 5,000 mg at 04/17/21 1255   heparin lock flush 100 unit/mL, 500 Units, Intravenous, Once, [name], MD   sodium chloride flush (NS) 0.9 % injection 10 mL, 10 mL, Intracatheter, PRN, [name], MD, 10 mL at 01/04/21 1149   sodium chloride flush (NS) 0.9 % injection 10 mL, 10 mL, Intravenous, Once, [name], MD  Physical exam:  Vitals:   04/17/21 0943  BP: (!) 146/70  Pulse: 75  Resp: 18  Temp: 97.9 F (36.6 C)  TempSrc: Tympanic  SpO2: 100%  Weight: 206 lb 3.2 oz (93.5 kg)   Physical Exam Constitutional:      General: He is not in acute distress. Cardiovascular:     Rate and Rhythm: Normal rate and regular rhythm.     Heart sounds: Normal heart sounds.  Pulmonary:     Effort: Pulmonary effort is normal.     Breath sounds: Normal breath sounds.  Abdominal:     General: Bowel sounds are normal.     Palpations: Abdomen is soft.  Skin:    General: Skin is warm and dry.  Neurological:     General: No focal deficit present.     Mental Status: He is alert and oriented to person, place, and time.     CMP Latest Ref Rng & Units 04/17/2021  Glucose 70 - 99 mg/dL 143(H)  BUN 8 - 23 mg/dL 12  Creatinine 0.61 - 1.24 mg/dL 0.79  Sodium 135 - 145 mmol/L 136  Potassium 3.5 - 5.1 mmol/L 3.8  Chloride 98 - 111 mmol/L 103  CO2 22 - 32 mmol/L 26  Calcium 8.9 - 10.3 mg/dL 8.9  Total Protein 6.5 - 8.1 g/dL 6.7  Total Bilirubin 0.3 - 1.2 mg/dL 0.8  Alkaline Phos 38 - 126 U/L 169(H)  AST 15 - 41 U/L 24  ALT 0 - 44 U/L 16   CBC Latest Ref Rng & Units 04/17/2021  WBC 4.0 - 10.5 K/uL 7.6  Hemoglobin 13.0 - 17.0 g/dL 11.5(L)  Hematocrit 39.0 - 52.0 % 35.7(L)  Platelets 150 - 400 K/uL 150    No images are attached to the encounter.  CT CHEST ABDOMEN PELVIS W CONTRAST  Result Date: 04/16/2021 CLINICAL DATA:  Metastatic esophageal cancer  restaging, metastases to liver,, status post  radiation therapy, ongoing chemotherapy EXAM: CT CHEST, ABDOMEN, AND PELVIS WITH CONTRAST TECHNIQUE: Multidetector CT imaging of the chest, abdomen and pelvis was performed following the standard protocol during bolus administration of intravenous contrast. CONTRAST:  134m OMNIPAQUE IOHEXOL 350 MG/ML SOLN, additional oral enteric contrast COMPARISON:  01/09/2021 FINDINGS: CT CHEST FINDINGS Cardiovascular: Right chest port catheter. Aortic atherosclerosis. Normal heart size. No pericardial effusion. Mediastinum/Nodes: No enlarged mediastinal, hilar, or axillary lymph nodes. Small hiatal hernia. Unchanged circumferential thickening of the lower esophagus (series 2, image 52) thyroid gland and trachea demonstrate no significant findings. Lungs/Pleura: Mild centrilobular and paraseptal emphysema. There is a new, somewhat spiculated appearing nodular opacity of the superior segment left lower lobe, measuring 1.6 x 1.0 cm (series 3, image 95). Fibrosis of the paramedian right lower lobe related to disc osteophytes and radiation therapy (series 3, image 127). No pleural effusion or pneumothorax. Musculoskeletal: No chest wall mass or suspicious bone lesions identified. CT ABDOMEN PELVIS FINDINGS Hepatobiliary: Somewhat coarse, nodular contour of the liver. Numerous, very subtle low-attenuation lesions throughout the liver parenchyma are difficult to discretely measure, however not significantly changed compared to prior examination, largest index lesion in the anterior left lobe measuring 2.4 x 1.2 cm (series 2, image 62). No gallstones, gallbladder wall thickening, or biliary dilatation. Pancreas: Unremarkable. No pancreatic ductal dilatation or surrounding inflammatory changes. Spleen: Unchanged splenomegaly, maximum coronal span 15.7 cm. Adrenals/Urinary Tract: Adrenal glands are unremarkable. Punctuate nonobstructive calculus of the inferior pole of the right kidney (series  4, image 96). No left-sided calculi or hydronephrosis. Bladder is unremarkable. Stomach/Bowel: Stomach is within normal limits. Appendix appears normal. No evidence of bowel wall thickening, distention, or inflammatory changes. Descending and sigmoid diverticula. Vascular/Lymphatic: Aortic atherosclerosis. No enlarged abdominal or pelvic lymph nodes. Reproductive: No mass or other abnormality. Other: No abdominal wall hernia or abnormality. No abdominopelvic ascites. Musculoskeletal: No acute or significant osseous findings. Unchanged, Schmorl type superior endplate deformities of L3 and L5 (series 6, image 95). IMPRESSION: 1. Numerous, very subtle low-attenuation lesions throughout the liver parenchyma are difficult to discretely measure, however not significantly changed compared to prior examination,, consistent with stable hepatic metastatic disease. 2. Unchanged circumferential thickening of the lower esophagus, in keeping with treated primary esophageal malignancy. 3. There is a new, somewhat spiculated appearing nodular opacity of the superior segment left lower lobe, measuring 1.6 x 1.0 cm. Given relatively rapid evolution in the interval to prior examination, morphology, and location, nonspecific infectious or inflammatory etiology is favored, although metastasis or metachronous primary lung malignancy are not strictly excluded. Attention on follow-up. 4. Emphysema. 5. Somewhat coarse, nodular contour of the liver, which may reflect either cirrhosis or pseudocirrhosis in the setting of treated hepatic metastatic disease 6. Unchanged splenomegaly. 7. Nonobstructive right nephrolithiasis. Aortic Atherosclerosis (ICD10-I70.0) and Emphysema (ICD10-J43.9). Electronically Signed   By: ADelanna AhmadiM.D.   On: 04/16/2021 21:47     Assessment and plan- Patient is a 65y.o. male with history of adenocarcinoma of the esophagus with liver metastases.  He is here for on treatment assessment prior to cycle 21 of 5-FU  trastuzumab chemotherapy  I have reviewed CT chest abdomen and pelvis images independently and discussed findings with patient.. Overall the burden of liver metastases appear stable.  There is a new 1.6 cm spiculated nodule in the left lower lobe which is concerning for a new met but could be potentially inflammatory.  No other evidence of progressive disease.  However patient CEA levels have been consistently trending up from 5.23  months ago to 26 presently.  I will plan to reinitiate oxaliplatin along with 5-FU trastuzumab Keytruda regimen at this time.  We will plan to obtain shot interim scan in about 6 to 8 weeks time.  If there is continued disease progression noted at that time I will switch him to FOLFIRI ramucirumab versus docetaxel ramucirumab chemotherapy at that time.  Patient does have Young Harris 1 fusion gene noted on his molecular testing which has a potential targetable drug in clinical trials.This clinical trial is available at [facility] but currently enrollment is on hold.  We will be in touch with [facility] to see if her future slot opens up for him.  I did give him the option to consider second opinion at [facility] and he would like to hold off on that at this time.  Patient reports seeing black spots in front of his eyes.  I have asked him to get in touch with ophthalmology if symptoms persist.  No neurological deficits.  If symptoms worsen and also consider getting an MRI brain at that time  I will see him back in 2 weeks for cycle 22 of FOLFOX trastuzumab Keytruda.  He will get 5-FU trastuzumab chemotherapy today and when he comes for pump DC on day 3 he will receive oxaliplatin for this cycle.    Visit Diagnosis 1. Esophageal adenocarcinoma ([place])   2. Encounter for antineoplastic chemotherapy      Dr. Randa Evens, MD, MPH [facility] [phone] 04/17/2021 1:03 PM

## 2021-04-19 ENCOUNTER — Inpatient Hospital Stay: Payer: Medicare Other

## 2021-04-19 ENCOUNTER — Other Ambulatory Visit: Payer: Self-pay

## 2021-04-19 VITALS — BP 159/77 | HR 69 | Temp 98.2°F | Resp 20

## 2021-04-19 DIAGNOSIS — Z5112 Encounter for antineoplastic immunotherapy: Secondary | ICD-10-CM | POA: Diagnosis not present

## 2021-04-19 DIAGNOSIS — C159 Malignant neoplasm of esophagus, unspecified: Secondary | ICD-10-CM

## 2021-04-19 MED ORDER — PALONOSETRON HCL INJECTION 0.25 MG/5ML
0.2500 mg | Freq: Once | INTRAVENOUS | Status: AC
  Administered 2021-04-19: 0.25 mg via INTRAVENOUS
  Filled 2021-04-19: qty 5

## 2021-04-19 MED ORDER — HEPARIN SOD (PORK) LOCK FLUSH 100 UNIT/ML IV SOLN
INTRAVENOUS | Status: AC
  Administered 2021-04-19: 500 [IU]
  Filled 2021-04-19: qty 5

## 2021-04-19 MED ORDER — HEPARIN SOD (PORK) LOCK FLUSH 100 UNIT/ML IV SOLN
500.0000 [IU] | Freq: Once | INTRAVENOUS | Status: DC | PRN
  Filled 2021-04-19: qty 5

## 2021-04-19 MED ORDER — SODIUM CHLORIDE 0.9 % IV SOLN
10.0000 mg | Freq: Once | INTRAVENOUS | Status: AC
  Administered 2021-04-19: 10 mg via INTRAVENOUS
  Filled 2021-04-19: qty 10

## 2021-04-19 MED ORDER — DEXTROSE 5 % IV SOLN
INTRAVENOUS | Status: DC
  Filled 2021-04-19: qty 250

## 2021-04-19 MED ORDER — OXALIPLATIN CHEMO INJECTION 100 MG/20ML
65.0000 mg/m2 | Freq: Once | INTRAVENOUS | Status: AC
  Administered 2021-04-19: 140 mg via INTRAVENOUS
  Filled 2021-04-19: qty 20

## 2021-04-19 MED ORDER — SODIUM CHLORIDE 0.9% FLUSH
10.0000 mL | INTRAVENOUS | Status: DC | PRN
  Administered 2021-04-19: 10 mL
  Filled 2021-04-19: qty 10

## 2021-04-19 NOTE — Progress Notes (Signed)
Patient on schedule for 5FU only for 10/11, MD wants to add back on oxaliplatin but to give with day 3/pump d/c.  Will hold off on udenyca for this cycle.

## 2021-04-19 NOTE — Patient Instructions (Signed)
[name]  Discharge Instructions: Thank you for choosing [facility] to provide your oncology and hematology care.  If you have a lab appointment with the [facility], please go directly to the [facility] and check in at the registration area.  Wear comfortable clothing and clothing appropriate for easy access to any Portacath or PICC line.   We strive to give you quality time with your provider. You may need to reschedule your appointment if you arrive late (15 or more minutes).  Arriving late affects you and other patients whose appointments are after yours.  Also, if you miss three or more appointments without notifying the office, you may be dismissed from the clinic at the provider's discretion.      For prescription refill requests, have your pharmacy contact our office and allow 72 hours for refills to be completed.    Today you received the following chemotherapy and/or immunotherapy agents: Oxaliplatin      To help prevent nausea and vomiting after your treatment, we encourage you to take your nausea medication as directed.  BELOW ARE SYMPTOMS THAT SHOULD BE REPORTED IMMEDIATELY: *FEVER GREATER THAN 100.4 F (38 C) OR HIGHER *CHILLS OR SWEATING *NAUSEA AND VOMITING THAT IS NOT CONTROLLED WITH YOUR NAUSEA MEDICATION *UNUSUAL SHORTNESS OF BREATH *UNUSUAL BRUISING OR BLEEDING *URINARY PROBLEMS (pain or burning when urinating, or frequent urination) *BOWEL PROBLEMS (unusual diarrhea, constipation, pain near the anus) TENDERNESS IN MOUTH AND THROAT WITH OR WITHOUT PRESENCE OF ULCERS (sore throat, sores in mouth, or a toothache) UNUSUAL RASH, SWELLING OR PAIN  UNUSUAL VAGINAL DISCHARGE OR ITCHING   Items with * indicate a potential emergency and should be followed up as soon as possible or go to the Emergency Department if any problems should occur.  Please show the CHEMOTHERAPY ALERT CARD or IMMUNOTHERAPY ALERT CARD at  check-in to the Emergency Department and triage nurse.  Should you have questions after your visit or need to cancel or reschedule your appointment, please contact [facility]  [phone] and follow the prompts.  Office hours are 8:00 a.m. to 4:30 p.m. Monday - Friday. Please note that voicemails left after 4:00 p.m. may not be returned until the following business day.  We are closed weekends and major holidays. You have access to a nurse at all times for urgent questions. Please call the main number to the clinic [phone] and follow the prompts.  For any non-urgent questions, you may also contact your provider using MyChart. We now offer e-Visits for anyone 2 and older to request care online for non-urgent symptoms. For details visit [email or web address].   Also download the MyChart app! Go to the app store, search "MyChart", open the app, select [facility], and log in with your MyChart username and password.  Due to Covid, a mask is required upon entering the hospital/clinic. If you do not have a mask, one will be given to you upon arrival. For doctor visits, patients may have 1 support person aged 17 or older with them. For treatment visits, patients cannot have anyone with them due to current Covid guidelines and our immunocompromised population. Oxaliplatin Injection What is this medication? OXALIPLATIN (ox AL i PLA tin) is a chemotherapy drug. It targets fast dividing cells, like cancer cells, and causes these cells to die. This medicine is used to treat cancers of the colon and rectum, and many other cancers. This medicine may be used for other purposes; ask your health  care provider or pharmacist if you have questions. COMMON BRAND NAME(S): Eloxatin What should I tell my care team before I take this medication? They need to know if you have any of these conditions: heart disease history of irregular heartbeat liver disease low blood counts,  like white cells, platelets, or red blood cells lung or breathing disease, like asthma take medicines that treat or prevent blood clots tingling of the fingers or toes, or other nerve disorder an unusual or allergic reaction to oxaliplatin, other chemotherapy, other medicines, foods, dyes, or preservatives pregnant or trying to get pregnant breast-feeding How should I use this medication? This drug is given as an infusion into a vein. It is administered in a hospital or clinic by a specially trained health care professional. Talk to your pediatrician regarding the use of this medicine in children. Special care may be needed. Overdosage: If you think you have taken too much of this medicine contact a poison control center or emergency room at once. NOTE: This medicine is only for you. Do not share this medicine with others. What if I miss a dose? It is important not to miss a dose. Call your doctor or health care professional if you are unable to keep an appointment. What may interact with this medication? Do not take this medicine with any of the following medications: cisapride dronedarone pimozide thioridazine This medicine may also interact with the following medications: aspirin and aspirin-like medicines certain medicines that treat or prevent blood clots like warfarin, apixaban, dabigatran, and rivaroxaban cisplatin cyclosporine diuretics medicines for infection like acyclovir, adefovir, amphotericin B, bacitracin, cidofovir, foscarnet, ganciclovir, gentamicin, pentamidine, vancomycin NSAIDs, medicines for pain and inflammation, like ibuprofen or naproxen other medicines that prolong the QT interval (an abnormal heart rhythm) pamidronate zoledronic acid This list may not describe all possible interactions. Give your health care provider a list of all the medicines, herbs, non-prescription drugs, or dietary supplements you use. Also tell them if you smoke, drink alcohol, or use  illegal drugs. Some items may interact with your medicine. What should I watch for while using this medication? Your condition will be monitored carefully while you are receiving this medicine. You may need blood work done while you are taking this medicine. This medicine may make you feel generally unwell. This is not uncommon as chemotherapy can affect healthy cells as well as cancer cells. Report any side effects. Continue your course of treatment even though you feel ill unless your healthcare professional tells you to stop. This medicine can make you more sensitive to cold. Do not drink cold drinks or use ice. Cover exposed skin before coming in contact with cold temperatures or cold objects. When out in cold weather wear warm clothing and cover your mouth and nose to warm the air that goes into your lungs. Tell your doctor if you get sensitive to the cold. Do not become pregnant while taking this medicine or for 9 months after stopping it. Women should inform their health care professional if they wish to become pregnant or think they might be pregnant. Men should not father a child while taking this medicine and for 6 months after stopping it. There is potential for serious side effects to an unborn child. Talk to your health care professional for more information. Do not breast-feed a child while taking this medicine or for 3 months after stopping it. This medicine has caused ovarian failure in some women. This medicine may make it more difficult to get pregnant. Talk to  your health care professional if you are concerned about your fertility. This medicine has caused decreased sperm counts in some men. This may make it more difficult to father a child. Talk to your health care professional if you are concerned about your fertility. This medicine may increase your risk of getting an infection. Call your health care professional for advice if you get a fever, chills, or sore throat, or other symptoms  of a cold or flu. Do not treat yourself. Try to avoid being around people who are sick. Avoid taking medicines that contain aspirin, acetaminophen, ibuprofen, naproxen, or ketoprofen unless instructed by your health care professional. These medicines may hide a fever. Be careful brushing or flossing your teeth or using a toothpick because you may get an infection or bleed more easily. If you have any dental work done, tell your dentist you are receiving this medicine. What side effects may I notice from receiving this medication? Side effects that you should report to your doctor or health care professional as soon as possible: allergic reactions like skin rash, itching or hives, swelling of the face, lips, or tongue breathing problems cough low blood counts - this medicine may decrease the number of white blood cells, red blood cells, and platelets. You may be at increased risk for infections and bleeding nausea, vomiting pain, redness, or irritation at site where injected pain, tingling, numbness in the hands or feet signs and symptoms of bleeding such as bloody or black, tarry stools; red or dark brown urine; spitting up blood or brown material that looks like coffee grounds; red spots on the skin; unusual bruising or bleeding from the eyes, gums, or nose signs and symptoms of a dangerous change in heartbeat or heart rhythm like chest pain; dizziness; fast, irregular heartbeat; palpitations; feeling faint or lightheaded; falls signs and symptoms of infection like fever; chills; cough; sore throat; pain or trouble passing urine signs and symptoms of liver injury like dark yellow or brown urine; general ill feeling or flu-like symptoms; light-colored stools; loss of appetite; nausea; right upper belly pain; unusually weak or tired; yellowing of the eyes or skin signs and symptoms of low red blood cells or anemia such as unusually weak or tired; feeling faint or lightheaded; falls signs and symptoms  of muscle injury like dark urine; trouble passing urine or change in the amount of urine; unusually weak or tired; muscle pain; back pain Side effects that usually do not require medical attention (report to your doctor or health care professional if they continue or are bothersome): changes in taste diarrhea gas hair loss loss of appetite mouth sores This list may not describe all possible side effects. Call your doctor for medical advice about side effects. You may report side effects to FDA at 1-800-FDA-1088. Where should I keep my medication? This drug is given in a hospital or clinic and will not be stored at home. NOTE: This sheet is a summary. It may not cover all possible information. If you have questions about this medicine, talk to your doctor, pharmacist, or health care provider.  2022 [employer] (2018-11-11 12:20:35)

## 2021-05-01 ENCOUNTER — Other Ambulatory Visit: Payer: Self-pay

## 2021-05-01 ENCOUNTER — Inpatient Hospital Stay: Payer: Medicare Other

## 2021-05-01 ENCOUNTER — Encounter: Payer: Self-pay | Admitting: Oncology

## 2021-05-01 ENCOUNTER — Inpatient Hospital Stay (HOSPITAL_BASED_OUTPATIENT_CLINIC_OR_DEPARTMENT_OTHER): Payer: Medicare Other | Admitting: Oncology

## 2021-05-01 VITALS — BP 156/83 | HR 72 | Temp 97.6°F | Resp 18 | Wt 206.1 lb

## 2021-05-01 DIAGNOSIS — Z79899 Other long term (current) drug therapy: Secondary | ICD-10-CM

## 2021-05-01 DIAGNOSIS — Z5111 Encounter for antineoplastic chemotherapy: Secondary | ICD-10-CM

## 2021-05-01 DIAGNOSIS — C160 Malignant neoplasm of cardia: Secondary | ICD-10-CM

## 2021-05-01 DIAGNOSIS — C159 Malignant neoplasm of esophagus, unspecified: Secondary | ICD-10-CM | POA: Diagnosis not present

## 2021-05-01 DIAGNOSIS — Z1329 Encounter for screening for other suspected endocrine disorder: Secondary | ICD-10-CM | POA: Diagnosis not present

## 2021-05-01 DIAGNOSIS — Z5112 Encounter for antineoplastic immunotherapy: Secondary | ICD-10-CM

## 2021-05-01 DIAGNOSIS — D6481 Anemia due to antineoplastic chemotherapy: Secondary | ICD-10-CM | POA: Diagnosis not present

## 2021-05-01 DIAGNOSIS — Z1731 Human epidermal growth factor receptor 2 positive status: Secondary | ICD-10-CM

## 2021-05-01 DIAGNOSIS — C799 Secondary malignant neoplasm of unspecified site: Secondary | ICD-10-CM

## 2021-05-01 DIAGNOSIS — T451X5A Adverse effect of antineoplastic and immunosuppressive drugs, initial encounter: Secondary | ICD-10-CM

## 2021-05-01 LAB — COMPREHENSIVE METABOLIC PANEL
ALT: 20 U/L (ref 0–44)
AST: 28 U/L (ref 15–41)
Albumin: 4.1 g/dL (ref 3.5–5.0)
Alkaline Phosphatase: 174 U/L — ABNORMAL HIGH (ref 38–126)
Anion gap: 8 (ref 5–15)
BUN: 18 mg/dL (ref 8–23)
CO2: 25 mmol/L (ref 22–32)
Calcium: 9.3 mg/dL (ref 8.9–10.3)
Chloride: 104 mmol/L (ref 98–111)
Creatinine, Ser: 0.89 mg/dL (ref 0.61–1.24)
GFR, Estimated: >60 mL/min (ref >60)
Glucose, Bld: 119 mg/dL — ABNORMAL HIGH (ref 70–99)
Potassium: 3.9 mmol/L (ref 3.5–5.1)
Sodium: 137 mmol/L (ref 135–145)
Total Bilirubin: 1.1 mg/dL (ref 0.3–1.2)
Total Protein: 6.8 g/dL (ref 6.5–8.1)

## 2021-05-01 LAB — CBC WITH DIFFERENTIAL/PLATELET
Abs Immature Granulocytes: 0.02 10*3/uL (ref 0.00–0.07)
Basophils Absolute: 0 10*3/uL (ref 0.0–0.1)
Basophils Relative: 0 %
Eosinophils Absolute: 0.3 10*3/uL (ref 0.0–0.5)
Eosinophils Relative: 5 %
HCT: 36.8 % — ABNORMAL LOW (ref 39.0–52.0)
Hemoglobin: 12.1 g/dL — ABNORMAL LOW (ref 13.0–17.0)
Immature Granulocytes: 0 %
Lymphocytes Relative: 14 %
Lymphs Abs: 0.9 10*3/uL (ref 0.7–4.0)
MCH: 30 pg (ref 26.0–34.0)
MCHC: 32.9 g/dL (ref 30.0–36.0)
MCV: 91.1 fL (ref 80.0–100.0)
Monocytes Absolute: 0.7 10*3/uL (ref 0.1–1.0)
Monocytes Relative: 11 %
Neutro Abs: 4.4 10*3/uL (ref 1.7–7.7)
Neutrophils Relative %: 70 %
Platelets: 131 10*3/uL — ABNORMAL LOW (ref 150–400)
RBC: 4.04 MIL/uL — ABNORMAL LOW (ref 4.22–5.81)
RDW: 15.1 % (ref 11.5–15.5)
WBC: 6.2 10*3/uL (ref 4.0–10.5)
nRBC: 0 % (ref 0.0–0.2)

## 2021-05-01 MED ORDER — FLUOROURACIL CHEMO INJECTION 2.5 GM/50ML
400.0000 mg/m2 | Freq: Once | INTRAVENOUS | Status: AC
  Administered 2021-05-01: 850 mg via INTRAVENOUS
  Filled 2021-05-01: qty 17

## 2021-05-01 MED ORDER — LIDOCAINE-PRILOCAINE 2.5-2.5 % EX CREA
1.0000 | TOPICAL_CREAM | CUTANEOUS | 3 refills | Status: DC | PRN
Start: 2021-05-01 — End: 2021-11-20

## 2021-05-01 MED ORDER — LEUCOVORIN CALCIUM INJECTION 350 MG
850.0000 mg | Freq: Once | INTRAVENOUS | Status: AC
  Administered 2021-05-01: 850 mg via INTRAVENOUS
  Filled 2021-05-01: qty 42.5

## 2021-05-01 MED ORDER — TRASTUZUMAB-ANNS CHEMO 150 MG IV SOLR
4.0000 mg/kg | Freq: Once | INTRAVENOUS | Status: AC
  Administered 2021-05-01: 357 mg via INTRAVENOUS
  Filled 2021-05-01: qty 17

## 2021-05-01 MED ORDER — DEXTROSE 5 % IV SOLN
Freq: Once | INTRAVENOUS | Status: AC
  Filled 2021-05-01: qty 250

## 2021-05-01 MED ORDER — HEPARIN SOD (PORK) LOCK FLUSH 100 UNIT/ML IV SOLN
500.0000 [IU] | Freq: Once | INTRAVENOUS | Status: DC
  Filled 2021-05-01: qty 5

## 2021-05-01 MED ORDER — PALONOSETRON HCL INJECTION 0.25 MG/5ML
0.2500 mg | Freq: Once | INTRAVENOUS | Status: AC
  Administered 2021-05-01: 0.25 mg via INTRAVENOUS
  Filled 2021-05-01: qty 5

## 2021-05-01 MED ORDER — SODIUM CHLORIDE 0.9% FLUSH
10.0000 mL | Freq: Once | INTRAVENOUS | Status: AC
  Administered 2021-05-01: 10 mL via INTRAVENOUS
  Filled 2021-05-01: qty 10

## 2021-05-01 MED ORDER — SODIUM CHLORIDE 0.9 % IV SOLN
400.0000 mg | Freq: Once | INTRAVENOUS | Status: AC
  Administered 2021-05-01: 400 mg via INTRAVENOUS
  Filled 2021-05-01: qty 16

## 2021-05-01 MED ORDER — SODIUM CHLORIDE 0.9 % IV SOLN
5000.0000 mg | INTRAVENOUS | Status: AC
  Administered 2021-05-01: 5000 mg via INTRAVENOUS
  Filled 2021-05-01: qty 100

## 2021-05-01 MED ORDER — OXALIPLATIN CHEMO INJECTION 100 MG/20ML
65.0000 mg/m2 | Freq: Once | INTRAVENOUS | Status: AC
  Administered 2021-05-01: 140 mg via INTRAVENOUS
  Filled 2021-05-01: qty 20

## 2021-05-01 MED ORDER — SODIUM CHLORIDE 0.9 % IV SOLN
850.0000 mg | Freq: Once | INTRAVENOUS | Status: DC
  Filled 2021-05-01: qty 42.5

## 2021-05-01 MED ORDER — ACETAMINOPHEN 325 MG PO TABS
650.0000 mg | ORAL_TABLET | Freq: Once | ORAL | Status: AC
  Administered 2021-05-01: 650 mg via ORAL
  Filled 2021-05-01: qty 2

## 2021-05-01 MED ORDER — SODIUM CHLORIDE 0.9 % IV SOLN
10.0000 mg | Freq: Once | INTRAVENOUS | Status: AC
  Administered 2021-05-01: 10 mg via INTRAVENOUS
  Filled 2021-05-01: qty 10

## 2021-05-01 MED ORDER — DIPHENHYDRAMINE HCL 50 MG/ML IJ SOLN
50.0000 mg | Freq: Once | INTRAMUSCULAR | Status: AC
  Administered 2021-05-01: 50 mg via INTRAVENOUS
  Filled 2021-05-01: qty 1

## 2021-05-01 NOTE — Progress Notes (Signed)
Hematology/Oncology Consult note [facility]  [phone] [phone]  Patient Care Team: Venia Carbon, MD as PCP - General [name], RN as Oncology Nurse Navigator [name], MD as Consulting Physician (Hematology and Oncology)   Name of the patient: Cory [name]  [id number]  [birth year]   Date of visit: 05/01/21  Diagnosis-  metastatic esophageal cancer with liver metastases  Chief complaint/ Reason for visit-on treatment assessment prior to cycle 22 of FOLFOX trastuzumab Keytruda chemotherapy  Heme/Onc history: Patient is a 65 year old male who presented with symptoms of indigestion and heartburn which gradually progressed to dysphagia.  He underwent EGD on 06/20/2020 By Dr. Vicente Males which showed a large nearly obstructing mass in the lower third of the esophagus 35 cm from incisors.  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  PD-l1 <1 %, TMB HIGH    Presently patient reports dysphagia but is able to eat cereal or soft food.  He has lost about 13 pounds in the last month itself.  Denies any significant pain.  He recently retired after many years of service and is otherwise independent of his ADLs and IADLs.     PET CT scan on 06/26/2020 showed circumferential distal esophageal mass with an SUV of 13.3 extending over 5.5 cm. Right lower paratracheal node is 0.6 cm with an SUV of 3.1. Hypermetabolic right gastric lymph nodes including 1.1 cm node with an SUV of 5.8. Numerous hypermetabolic lesions throughout the liver somewhat confluent around the periphery which were hypermetabolic between 8 and 9. Faintly hypermetabolic left adrenal gland   NGS testing showed high tumor mutational burden CPS score less than 1. CD9-NRG1 fusion. BRAF 581L, CCN E1 gain.  EMLA for fusion of BX [id number], FGF 23 gain, FGF 6 gain, MET R988C, T p53 S1 72F. ERBB2 gain but no other actionable mutations   Patient currently  on first-line FOLFOX/trastuzumab/Keytruda.  He gets Keytruda every 6 weeks    Interval history-patient does not have a brace in his right upper extremity anymore.  He is working with physical therapy as well.  He still has some pain in his upper part of right upper extremity denies any significant neuropathy in his extremities.  ECOG PS- 1 Pain scale- 3 Opioid associated constipation- no  Review of systems- Review of Systems  Constitutional:  Negative for chills, fever, malaise/fatigue and weight loss.  HENT:  Negative for congestion, ear discharge and nosebleeds.   Eyes:  Negative for blurred vision.  Respiratory:  Negative for cough, hemoptysis, sputum production, shortness of breath and wheezing.   Cardiovascular:  Negative for chest pain, palpitations, orthopnea and claudication.  Gastrointestinal:  Negative for abdominal pain, blood in stool, constipation, diarrhea, heartburn, melena, nausea and vomiting.  Genitourinary:  Negative for dysuria, flank pain, frequency, hematuria and urgency.  Musculoskeletal:  Negative for back pain, joint pain and myalgias.       Right upper extremity pain  Skin:  Negative for rash.  Neurological:  Negative for dizziness, tingling, focal weakness, seizures, weakness and headaches.  Endo/Heme/Allergies:  Does not bruise/bleed easily.  Psychiatric/Behavioral:  Negative for depression and suicidal ideas. The patient does not have insomnia.       No Known Allergies   Past Medical History:  Diagnosis Date   Esophageal adenocarcinoma ([place]) 06/23/2020   Family history of brain cancer    Family history of colon cancer    Family history of melanoma    Family history  of stomach cancer    GERD (gastroesophageal reflux disease)    History of kidney stones    Nephrolithiasis    [facility]   Pancreatitis, acute    Personal history of colonic polyps    Dr Nicolasa Ducking   Psoriasis      Past Surgical History:  Procedure Laterality Date   COLONOSCOPY      COLONOSCOPY WITH PROPOFOL N/A 03/31/2020   Procedure: COLONOSCOPY WITH PROPOFOL;  Surgeon: Jonathon Bellows, MD;  Location: [facility] ENDOSCOPY;  Service: Gastroenterology;  Laterality: N/A;   ERCP     ESOPHAGOGASTRODUODENOSCOPY (EGD) WITH PROPOFOL N/A 06/19/2020   Procedure: ESOPHAGOGASTRODUODENOSCOPY (EGD) WITH PROPOFOL;  Surgeon: Jonathon Bellows, MD;  Location: [facility] ENDOSCOPY;  Service: Gastroenterology;  Laterality: N/A;   groin surgery     as a child   PORTA CATH INSERTION N/A 06/28/2020   Procedure: PORTA CATH INSERTION;  Surgeon: Algernon Huxley, MD;  Location: [facility] CV LAB;  Service: Cardiovascular;  Laterality: N/A;   SHOULDER SURGERY      Social History   Socioeconomic History   Marital status: Married    Spouse name: Not on file   Number of children: 2   Years of education: Not on file   Highest education level: Not on file  Occupational History   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  Tobacco Use   Smoking status: Former    Types: Cigarettes    Quit date: 07/08/1988    Years since quitting: 32.8   Smokeless tobacco: Never  Vaping Use   Vaping Use: Never used  Substance and Sexual Activity   Alcohol use: No    Comment: recovering alcoholic for 13 years   Drug use: No   Sexual activity: Not Currently  Other Topics Concern   Not on file  Social History Narrative   Has living will--needs to get notarized   Daughter should be health care POA (wife with dementia)   Would accept resuscitation   Would accept tube feeds depending on the circumstance   Social Determinants of Health   Financial Resource Strain: Not on file  Food Insecurity: Not on file  Transportation Needs: Not on file  Physical Activity: Not on file  Stress: Not on file  Social Connections: Not on file  Intimate Partner Violence: Not on file    Family History  Problem Relation Age of Onset   Stomach cancer Mother    Diabetes Brother    Hypertension Brother    Hypertension  Father    Prostate cancer Father    Colon cancer Sister    Cancer Other        either cervical or ovarian, d. 63s   Brain cancer Sister      Current Outpatient Medications:    acetaminophen (TYLENOL) 325 MG tablet, Take 650 mg by mouth every 6 (six) hours as needed., Disp: , Rfl:    celecoxib (CELEBREX) 200 MG capsule, celecoxib 200 mg capsule, Disp: , Rfl:    dexamethasone (DECADRON) 4 MG tablet, Take 2 tablets (8 mg total) by mouth daily. Start the day after chemotherapy for 2 days. Take with food., Disp: 30 tablet, Rfl: 1   oxyCODONE (OXY IR/ROXICODONE) 5 MG immediate release tablet, TAKE 1 TABLET BY MOUTH EVERY 8 HOURS AS NEEDED FOR SEVERE PAIN, Disp: 60 tablet, Rfl: 0   oxyCODONE-acetaminophen (PERCOCET/ROXICET) 5-325 MG tablet, Take 1 tablet by mouth every 6 (six) hours as needed for severe pain., Disp: 20 tablet, Rfl: 0  pantoprazole (PROTONIX) 40 MG tablet, TAKE 1 TABLET BY MOUTH TWICE A DAY BEFORE MEALS, Disp: 60 tablet, Rfl: 11   triamcinolone cream (KENALOG) 0.1 %, Apply 1 application topically 3 (three) times daily as needed., Disp: 300 each, Rfl: 1   lidocaine-prilocaine (EMLA) cream, Apply 1 application topically as needed. Apply small amount to port site at least 1 hour prior to it being accessed, cover with plastic wrap, Disp: 30 g, Rfl: 3   LORazepam (ATIVAN) 0.5 MG tablet, Take 1 tablet (0.5 mg total) by mouth every 6 (six) hours as needed (Nausea or vomiting). (Patient not taking: No sig reported), Disp: 30 tablet, Rfl: 0   ondansetron (ZOFRAN) 8 MG tablet, Take 1 tablet (8 mg total) by mouth 2 (two) times daily as needed for refractory nausea / vomiting. Start on day 3 after chemotherapy. (Patient not taking: No sig reported), Disp: 30 tablet, Rfl: 1   prochlorperazine (COMPAZINE) 10 MG tablet, Take 1 tablet (10 mg total) by mouth every 6 (six) hours as needed (Nausea or vomiting). (Patient not taking: No sig reported), Disp: 30 tablet, Rfl: 1 No current  facility-administered medications for this visit.  Facility-Administered Medications Ordered in Other Visits:    fluorouracil (ADRUCIL) 5,000 mg in sodium chloride 0.9 % 150 mL chemo infusion, 5,000 mg, Intravenous, 1 day or 1 dose, [name], MD   fluorouracil (ADRUCIL) chemo injection 850 mg, 400 mg/m2 (Treatment Plan Recorded), Intravenous, Once, [name], MD   heparin lock flush 100 unit/mL, 500 Units, Intravenous, Once, [name], MD   leucovorin 850 mg in dextrose 5 % 250 mL infusion, 850 mg, Intravenous, Once, [name], MD, Last Rate: 146 mL/hr at 05/01/21 1216, 850 mg at 05/01/21 1216   oxaliplatin (ELOXATIN) 140 mg in dextrose 5 % 500 mL chemo infusion, 65 mg/m2 (Treatment Plan Recorded), Intravenous, Once, [name], MD, Last Rate: 264 mL/hr at 05/01/21 1218, 140 mg at 05/01/21 1218   sodium chloride flush (NS) 0.9 % injection 10 mL, 10 mL, Intracatheter, PRN, [name], MD, 10 mL at 01/04/21 1149  Physical exam:  Vitals:   05/01/21 0838  BP: (!) 156/83  Pulse: 72  Resp: 18  Temp: 97.6 F (36.4 C)  SpO2: 100%  Weight: 206 lb 1.6 oz (93.5 kg)   Physical Exam Constitutional:      General: He is not in acute distress. Cardiovascular:     Rate and Rhythm: Normal rate and regular rhythm.     Heart sounds: Normal heart sounds.  Pulmonary:     Effort: Pulmonary effort is normal.     Breath sounds: Normal breath sounds.  Abdominal:     General: Bowel sounds are normal.     Palpations: Abdomen is soft.  Skin:    General: Skin is warm and dry.  Neurological:     Mental Status: He is alert and oriented to person, place, and time.     CMP Latest Ref Rng & Units 05/01/2021  Glucose 70 - 99 mg/dL 119(H)  BUN 8 - 23 mg/dL 18  Creatinine 0.61 - 1.24 mg/dL 0.89  Sodium 135 - 145 mmol/L 137  Potassium 3.5 - 5.1 mmol/L 3.9  Chloride 98 - 111 mmol/L 104  CO2 22 - 32 mmol/L 25  Calcium 8.9 - 10.3 mg/dL 9.3  Total Protein 6.5 - 8.1 g/dL 6.8  Total  Bilirubin 0.3 - 1.2 mg/dL 1.1  Alkaline Phos 38 - 126 U/L 174(H)  AST 15 - 41  U/L 28  ALT 0 - 44 U/L 20   CBC Latest Ref Rng & Units 05/01/2021  WBC 4.0 - 10.5 K/uL 6.2  Hemoglobin 13.0 - 17.0 g/dL 12.1(L)  Hematocrit 39.0 - 52.0 % 36.8(L)  Platelets 150 - 400 K/uL 131(L)    No images are attached to the encounter.  CT CHEST ABDOMEN PELVIS W CONTRAST  Result Date: 04/16/2021 CLINICAL DATA:  Metastatic esophageal cancer restaging, metastases to liver,, status post radiation therapy, ongoing chemotherapy EXAM: CT CHEST, ABDOMEN, AND PELVIS WITH CONTRAST TECHNIQUE: Multidetector CT imaging of the chest, abdomen and pelvis was performed following the standard protocol during bolus administration of intravenous contrast. CONTRAST:  167m OMNIPAQUE IOHEXOL 350 MG/ML SOLN, additional oral enteric contrast COMPARISON:  01/09/2021 FINDINGS: CT CHEST FINDINGS Cardiovascular: Right chest port catheter. Aortic atherosclerosis. Normal heart size. No pericardial effusion. Mediastinum/Nodes: No enlarged mediastinal, hilar, or axillary lymph nodes. Small hiatal hernia. Unchanged circumferential thickening of the lower esophagus (series 2, image 52) thyroid gland and trachea demonstrate no significant findings. Lungs/Pleura: Mild centrilobular and paraseptal emphysema. There is a new, somewhat spiculated appearing nodular opacity of the superior segment left lower lobe, measuring 1.6 x 1.0 cm (series 3, image 95). Fibrosis of the paramedian right lower lobe related to disc osteophytes and radiation therapy (series 3, image 127). No pleural effusion or pneumothorax. Musculoskeletal: No chest wall mass or suspicious bone lesions identified. CT ABDOMEN PELVIS FINDINGS Hepatobiliary: Somewhat coarse, nodular contour of the liver. Numerous, very subtle low-attenuation lesions throughout the liver parenchyma are difficult to discretely measure, however not significantly changed compared to prior examination, largest index  lesion in the anterior left lobe measuring 2.4 x 1.2 cm (series 2, image 62). No gallstones, gallbladder wall thickening, or biliary dilatation. Pancreas: Unremarkable. No pancreatic ductal dilatation or surrounding inflammatory changes. Spleen: Unchanged splenomegaly, maximum coronal span 15.7 cm. Adrenals/Urinary Tract: Adrenal glands are unremarkable. Punctuate nonobstructive calculus of the inferior pole of the right kidney (series 4, image 96). No left-sided calculi or hydronephrosis. Bladder is unremarkable. Stomach/Bowel: Stomach is within normal limits. Appendix appears normal. No evidence of bowel wall thickening, distention, or inflammatory changes. Descending and sigmoid diverticula. Vascular/Lymphatic: Aortic atherosclerosis. No enlarged abdominal or pelvic lymph nodes. Reproductive: No mass or other abnormality. Other: No abdominal wall hernia or abnormality. No abdominopelvic ascites. Musculoskeletal: No acute or significant osseous findings. Unchanged, Schmorl type superior endplate deformities of L3 and L5 (series 6, image 95). IMPRESSION: 1. Numerous, very subtle low-attenuation lesions throughout the liver parenchyma are difficult to discretely measure, however not significantly changed compared to prior examination,, consistent with stable hepatic metastatic disease. 2. Unchanged circumferential thickening of the lower esophagus, in keeping with treated primary esophageal malignancy. 3. There is a new, somewhat spiculated appearing nodular opacity of the superior segment left lower lobe, measuring 1.6 x 1.0 cm. Given relatively rapid evolution in the interval to prior examination, morphology, and location, nonspecific infectious or inflammatory etiology is favored, although metastasis or metachronous primary lung malignancy are not strictly excluded. Attention on follow-up. 4. Emphysema. 5. Somewhat coarse, nodular contour of the liver, which may reflect either cirrhosis or pseudocirrhosis in the  setting of treated hepatic metastatic disease 6. Unchanged splenomegaly. 7. Nonobstructive right nephrolithiasis. Aortic Atherosclerosis (ICD10-I70.0) and Emphysema (ICD10-J43.9). Electronically Signed   By: ADelanna AhmadiM.D.   On: 04/16/2021 21:47     Assessment and plan- Patient is a 65y.o. male with history of adenocarcinoma of esophagus with liver metastases.  He is here for on treatment  assessment prior to cycle 22 of FOLFOX trastuzumab Keytruda chemotherapy  Counts ok to proceed with cycle 22 of FOLFOX trastuzumab Keytruda chemotherapy today with pump disconnect on day 3 and receives [place].  I will see him back in 2 weeks for cycle 23.  Plan to repeat scans after cycle 24.  Mild thrombocytopenia likely secondary to oxaliplatin.  Continue to monitor  Will obtain echocardiogram in the next 2 to 3 weeks.  Patient does have pain in his right upper extremity secondary to his recent fracture and should be okay for him to use as needed oxycodone   Visit Diagnosis 1. Esophageal adenocarcinoma ([place])   2. Encounter for antineoplastic chemotherapy   3. 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 Antineoplastic chemotherapy induced anemia   5. Thyroid disorder screening   6. High risk medication use   7. Encounter for monoclonal antibody treatment for malignancy   8. Encounter for antineoplastic immunotherapy      Dr. Randa Evens, MD, MPH [facility] at [facility] [phone] 05/01/2021 12:40 PM

## 2021-05-01 NOTE — Patient Instructions (Signed)
[facility]  Discharge Instructions: Thank you for choosing [facility] to provide your oncology and hematology care.  If you have a lab appointment with the [facility], please go directly to the [facility] and check in at the registration area.  Wear comfortable clothing and clothing appropriate for easy access to any Portacath or PICC line.   We strive to give you quality time with your provider. You may need to reschedule your appointment if you arrive late (15 or more minutes).  Arriving late affects you and other patients whose appointments are after yours.  Also, if you miss three or more appointments without notifying the office, you may be dismissed from the clinic at the provider's discretion.      For prescription refill requests, have your pharmacy contact our office and allow 72 hours for refills to be completed.    Today you received the following chemotherapy and/or immunotherapy agents KEYTRUDA, KANJINTI, LEUCOVORIN, OXAPLATIN, 5 FU      To help prevent nausea and vomiting after your treatment, we encourage you to take your nausea medication as directed.  BELOW ARE SYMPTOMS THAT SHOULD BE REPORTED IMMEDIATELY: *FEVER GREATER THAN 100.4 F (38 C) OR HIGHER *CHILLS OR SWEATING *NAUSEA AND VOMITING THAT IS NOT CONTROLLED WITH YOUR NAUSEA MEDICATION *UNUSUAL SHORTNESS OF BREATH *UNUSUAL BRUISING OR BLEEDING *URINARY PROBLEMS (pain or burning when urinating, or frequent urination) *BOWEL PROBLEMS (unusual diarrhea, constipation, pain near the anus) TENDERNESS IN MOUTH AND THROAT WITH OR WITHOUT PRESENCE OF ULCERS (sore throat, sores in mouth, or a toothache) UNUSUAL RASH, SWELLING OR PAIN  UNUSUAL VAGINAL DISCHARGE OR ITCHING   Items with * indicate a potential emergency and should be followed up as soon as possible or go to the Emergency Department if any problems should occur.  Please show the CHEMOTHERAPY ALERT CARD or  IMMUNOTHERAPY ALERT CARD at check-in to the Emergency Department and triage nurse.  Should you have questions after your visit or need to cancel or reschedule your appointment, please contact [facility]  [phone] and follow the prompts.  Office hours are 8:00 a.m. to 4:30 p.m. Monday - Friday. Please note that voicemails left after 4:00 p.m. may not be returned until the following business day.  We are closed weekends and major holidays. You have access to a nurse at all times for urgent questions. Please call the main number to the clinic [phone] and follow the prompts.  For any non-urgent questions, you may also contact your provider using MyChart. We now offer e-Visits for anyone 75 and older to request care online for non-urgent symptoms. For details visit [email or web address].   Also download the MyChart app! Go to the app store, search "MyChart", open the app, select [facility], and log in with your MyChart username and password.  Due to Covid, a mask is required upon entering the hospital/clinic. If you do not have a mask, one will be given to you upon arrival. For doctor visits, patients may have 1 support person aged 36 or older with them. For treatment visits, patients cannot have anyone with them due to current Covid guidelines and our immunocompromised population.   Pembrolizumab injection What is this medication? PEMBROLIZUMAB (pem broe liz ue mab) is a monoclonal antibody. It is used to treat certain types of cancer. This medicine may be used for other purposes; ask your health care provider or pharmacist if you have questions. COMMON BRAND NAME(S): Keytruda What should  I tell my care team before I take this medication? They need to know if you have any of these conditions: autoimmune diseases like Crohn's disease, ulcerative colitis, or lupus have had or planning to have an allogeneic stem cell transplant (uses someone else's stem  cells) history of organ transplant history of chest radiation nervous system problems like myasthenia gravis or Guillain-Barre syndrome an unusual or allergic reaction to pembrolizumab, other medicines, foods, dyes, or preservatives pregnant or trying to get pregnant breast-feeding How should I use this medication? This medicine is for infusion into a vein. It is given by a health care professional in a hospital or clinic setting. A special MedGuide will be given to you before each treatment. Be sure to read this information carefully each time. Talk to your pediatrician regarding the use of this medicine in children. While this drug may be prescribed for children as young as 6 months for selected conditions, precautions do apply. Overdosage: If you think you have taken too much of this medicine contact a poison control center or emergency room at once. NOTE: This medicine is only for you. Do not share this medicine with others. What if I miss a dose? It is important not to miss your dose. Call your doctor or health care professional if you are unable to keep an appointment. What may interact with this medication? Interactions have not been studied. This list may not describe all possible interactions. Give your health care provider a list of all the medicines, herbs, non-prescription drugs, or dietary supplements you use. Also tell them if you smoke, drink alcohol, or use illegal drugs. Some items may interact with your medicine. What should I watch for while using this medication? Your condition will be monitored carefully while you are receiving this medicine. You may need blood work done while you are taking this medicine. Do not become pregnant while taking this medicine or for 4 months after stopping it. Women should inform their doctor if they wish to become pregnant or think they might be pregnant. There is a potential for serious side effects to an unborn child. Talk to your health care  professional or pharmacist for more information. Do not breast-feed an infant while taking this medicine or for 4 months after the last dose. What side effects may I notice from receiving this medication? Side effects that you should report to your doctor or health care professional as soon as possible: allergic reactions like skin rash, itching or hives, swelling of the face, lips, or tongue bloody or black, tarry breathing problems changes in vision chest pain chills confusion constipation cough diarrhea dizziness or feeling faint or lightheaded fast or irregular heartbeat fever flushing joint pain low blood counts - this medicine may decrease the number of white blood cells, red blood cells and platelets. You may be at increased risk for infections and bleeding. muscle pain muscle weakness pain, tingling, numbness in the hands or feet persistent headache redness, blistering, peeling or loosening of the skin, including inside the mouth signs and symptoms of high blood sugar such as dizziness; dry mouth; dry skin; fruity breath; nausea; stomach pain; increased hunger or thirst; increased urination signs and symptoms of kidney injury like trouble passing urine or change in the amount of urine signs and symptoms of liver injury like dark urine, light-colored stools, loss of appetite, nausea, right upper belly pain, yellowing of the eyes or skin sweating swollen lymph nodes weight loss Side effects that usually do not require medical attention (  report to your doctor or health care professional if they continue or are bothersome): decreased appetite hair loss tiredness This list may not describe all possible side effects. Call your doctor for medical advice about side effects. You may report side effects to FDA at 1-800-FDA-1088. Where should I keep my medication? This drug is given in a hospital or clinic and will not be stored at home. NOTE: This sheet is a summary. It may not  cover all possible information. If you have questions about this medicine, talk to your doctor, pharmacist, or health care provider.  2022 Elsevier/Gold Standard (2019-05-26 21:44:53)  Trastuzumab injection for infusion What is this medication? TRASTUZUMAB (tras TOO zoo mab) is a monoclonal antibody. It is used to treat breast cancer and stomach cancer. This medicine may be used for other purposes; ask your health care provider or pharmacist if you have questions. COMMON BRAND NAME(S): Herceptin, Janae Bridgeman, Ontruzant, Trazimera What should I tell my care team before I take this medication? They need to know if you have any of these conditions: heart disease heart failure lung or breathing disease, like asthma an unusual or allergic reaction to trastuzumab, benzyl alcohol, or other medications, foods, dyes, or preservatives pregnant or trying to get pregnant breast-feeding How should I use this medication? This drug is given as an infusion into a vein. It is administered in a hospital or clinic by a specially trained health care professional. Talk to your pediatrician regarding the use of this medicine in children. This medicine is not approved for use in children. Overdosage: If you think you have taken too much of this medicine contact a poison control center or emergency room at once. NOTE: This medicine is only for you. Do not share this medicine with others. What if I miss a dose? It is important not to miss a dose. Call your doctor or health care professional if you are unable to keep an appointment. What may interact with this medication? This medicine may interact with the following medications: certain types of chemotherapy, such as daunorubicin, doxorubicin, epirubicin, and idarubicin This list may not describe all possible interactions. Give your health care provider a list of all the medicines, herbs, non-prescription drugs, or dietary supplements you use. Also  tell them if you smoke, drink alcohol, or use illegal drugs. Some items may interact with your medicine. What should I watch for while using this medication? Visit your doctor for checks on your progress. Report any side effects. Continue your course of treatment even though you feel ill unless your doctor tells you to stop. Call your doctor or health care professional for advice if you get a fever, chills or sore throat, or other symptoms of a cold or flu. Do not treat yourself. Try to avoid being around people who are sick. You may experience fever, chills and shaking during your first infusion. These effects are usually mild and can be treated with other medicines. Report any side effects during the infusion to your health care professional. Fever and chills usually do not happen with later infusions. Do not become pregnant while taking this medicine or for 7 months after stopping it. Women should inform their doctor if they wish to become pregnant or think they might be pregnant. Women of child-bearing potential will need to have a negative pregnancy test before starting this medicine. There is a potential for serious side effects to an unborn child. Talk to your health care professional or pharmacist for more information. Do not  breast-feed an infant while taking this medicine or for 7 months after stopping it. Women must use effective birth control with this medicine. What side effects may I notice from receiving this medication? Side effects that you should report to your doctor or health care professional as soon as possible: allergic reactions like skin rash, itching or hives, swelling of the face, lips, or tongue chest pain or palpitations cough dizziness feeling faint or lightheaded, falls fever general ill feeling or flu-like symptoms signs of worsening heart failure like breathing problems; swelling in your legs and feet unusually weak or tired Side effects that usually do not require  medical attention (report to your doctor or health care professional if they continue or are bothersome): bone pain changes in taste diarrhea joint pain nausea/vomiting weight loss This list may not describe all possible side effects. Call your doctor for medical advice about side effects. You may report side effects to FDA at 1-800-FDA-1088. Where should I keep my medication? This drug is given in a hospital or clinic and will not be stored at home. NOTE: This sheet is a summary. It may not cover all possible information. If you have questions about this medicine, talk to your doctor, pharmacist, or health care provider.  2022 Elsevier/Gold Standard (2016-06-18 14:37:52)  Leucovorin injection What is this medication? LEUCOVORIN (loo koe VOR in) is used to prevent or treat the harmful effects of some medicines. This medicine is used to treat anemia caused by a low amount of folic acid in the body. It is also used with 5-fluorouracil (5-FU) to treat colon cancer. This medicine may be used for other purposes; ask your health care provider or pharmacist if you have questions. What should I tell my care team before I take this medication? They need to know if you have any of these conditions: anemia from low levels of vitamin B-12 in the blood an unusual or allergic reaction to leucovorin, folic acid, other medicines, foods, dyes, or preservatives pregnant or trying to get pregnant breast-feeding How should I use this medication? This medicine is for injection into a muscle or into a vein. It is given by a health care professional in a hospital or clinic setting. Talk to your pediatrician regarding the use of this medicine in children. Special care may be needed. Overdosage: If you think you have taken too much of this medicine contact a poison control center or emergency room at once. NOTE: This medicine is only for you. Do not share this medicine with others. What if I miss a dose? This  does not apply. What may interact with this medication? capecitabine fluorouracil phenobarbital phenytoin primidone trimethoprim-sulfamethoxazole This list may not describe all possible interactions. Give your health care provider a list of all the medicines, herbs, non-prescription drugs, or dietary supplements you use. Also tell them if you smoke, drink alcohol, or use illegal drugs. Some items may interact with your medicine. What should I watch for while using this medication? Your condition will be monitored carefully while you are receiving this medicine. This medicine may increase the side effects of 5-fluorouracil, 5-FU. Tell your doctor or health care professional if you have diarrhea or mouth sores that do not get better or that get worse. What side effects may I notice from receiving this medication? Side effects that you should report to your doctor or health care professional as soon as possible: allergic reactions like skin rash, itching or hives, swelling of the face, lips, or tongue breathing problems fever,  infection mouth sores unusual bleeding or bruising unusually weak or tired Side effects that usually do not require medical attention (report to your doctor or health care professional if they continue or are bothersome): constipation or diarrhea loss of appetite nausea, vomiting This list may not describe all possible side effects. Call your doctor for medical advice about side effects. You may report side effects to FDA at 1-800-FDA-1088. Where should I keep my medication? This drug is given in a hospital or clinic and will not be stored at home. NOTE: This sheet is a summary. It may not cover all possible information. If you have questions about this medicine, talk to your doctor, pharmacist, or health care provider.  2022 Elsevier/Gold Standard (2007-12-29 16:50:29)   Oxaliplatin Injection What is this medication? OXALIPLATIN (ox AL i PLA tin) is a  chemotherapy drug. It targets fast dividing cells, like cancer cells, and causes these cells to die. This medicine is used to treat cancers of the colon and rectum, and many other cancers. This medicine may be used for other purposes; ask your health care provider or pharmacist if you have questions. COMMON BRAND NAME(S): Eloxatin What should I tell my care team before I take this medication? They need to know if you have any of these conditions: heart disease history of irregular heartbeat liver disease low blood counts, like white cells, platelets, or red blood cells lung or breathing disease, like asthma take medicines that treat or prevent blood clots tingling of the fingers or toes, or other nerve disorder an unusual or allergic reaction to oxaliplatin, other chemotherapy, other medicines, foods, dyes, or preservatives pregnant or trying to get pregnant breast-feeding How should I use this medication? This drug is given as an infusion into a vein. It is administered in a hospital or clinic by a specially trained health care professional. Talk to your pediatrician regarding the use of this medicine in children. Special care may be needed. Overdosage: If you think you have taken too much of this medicine contact a poison control center or emergency room at once. NOTE: This medicine is only for you. Do not share this medicine with others. What if I miss a dose? It is important not to miss a dose. Call your doctor or health care professional if you are unable to keep an appointment. What may interact with this medication? Do not take this medicine with any of the following medications: cisapride dronedarone pimozide thioridazine This medicine may also interact with the following medications: aspirin and aspirin-like medicines certain medicines that treat or prevent blood clots like warfarin, apixaban, dabigatran, and rivaroxaban cisplatin cyclosporine diuretics medicines for  infection like acyclovir, adefovir, amphotericin B, bacitracin, cidofovir, foscarnet, ganciclovir, gentamicin, pentamidine, vancomycin NSAIDs, medicines for pain and inflammation, like ibuprofen or naproxen other medicines that prolong the QT interval (an abnormal heart rhythm) pamidronate zoledronic acid This list may not describe all possible interactions. Give your health care provider a list of all the medicines, herbs, non-prescription drugs, or dietary supplements you use. Also tell them if you smoke, drink alcohol, or use illegal drugs. Some items may interact with your medicine. What should I watch for while using this medication? Your condition will be monitored carefully while you are receiving this medicine. You may need blood work done while you are taking this medicine. This medicine may make you feel generally unwell. This is not uncommon as chemotherapy can affect healthy cells as well as cancer cells. Report any side effects. Continue your course of  treatment even though you feel ill unless your healthcare professional tells you to stop. This medicine can make you more sensitive to cold. Do not drink cold drinks or use ice. Cover exposed skin before coming in contact with cold temperatures or cold objects. When out in cold weather wear warm clothing and cover your mouth and nose to warm the air that goes into your lungs. Tell your doctor if you get sensitive to the cold. Do not become pregnant while taking this medicine or for 9 months after stopping it. Women should inform their health care professional if they wish to become pregnant or think they might be pregnant. Men should not father a child while taking this medicine and for 6 months after stopping it. There is potential for serious side effects to an unborn child. Talk to your health care professional for more information. Do not breast-feed a child while taking this medicine or for 3 months after stopping it. This medicine has  caused ovarian failure in some women. This medicine may make it more difficult to get pregnant. Talk to your health care professional if you are concerned about your fertility. This medicine has caused decreased sperm counts in some men. This may make it more difficult to father a child. Talk to your health care professional if you are concerned about your fertility. This medicine may increase your risk of getting an infection. Call your health care professional for advice if you get a fever, chills, or sore throat, or other symptoms of a cold or flu. Do not treat yourself. Try to avoid being around people who are sick. Avoid taking medicines that contain aspirin, acetaminophen, ibuprofen, naproxen, or ketoprofen unless instructed by your health care professional. These medicines may hide a fever. Be careful brushing or flossing your teeth or using a toothpick because you may get an infection or bleed more easily. If you have any dental work done, tell your dentist you are receiving this medicine. What side effects may I notice from receiving this medication? Side effects that you should report to your doctor or health care professional as soon as possible: allergic reactions like skin rash, itching or hives, swelling of the face, lips, or tongue breathing problems cough low blood counts - this medicine may decrease the number of white blood cells, red blood cells, and platelets. You may be at increased risk for infections and bleeding nausea, vomiting pain, redness, or irritation at site where injected pain, tingling, numbness in the hands or feet signs and symptoms of bleeding such as bloody or black, tarry stools; red or dark brown urine; spitting up blood or brown material that looks like coffee grounds; red spots on the skin; unusual bruising or bleeding from the eyes, gums, or nose signs and symptoms of a dangerous change in heartbeat or heart rhythm like chest pain; dizziness; fast, irregular  heartbeat; palpitations; feeling faint or lightheaded; falls signs and symptoms of infection like fever; chills; cough; sore throat; pain or trouble passing urine signs and symptoms of liver injury like dark yellow or brown urine; general ill feeling or flu-like symptoms; light-colored stools; loss of appetite; nausea; right upper belly pain; unusually weak or tired; yellowing of the eyes or skin signs and symptoms of low red blood cells or anemia such as unusually weak or tired; feeling faint or lightheaded; falls signs and symptoms of muscle injury like dark urine; trouble passing urine or change in the amount of urine; unusually weak or tired; muscle pain; back pain  Side effects that usually do not require medical attention (report to your doctor or health care professional if they continue or are bothersome): changes in taste diarrhea gas hair loss loss of appetite mouth sores This list may not describe all possible side effects. Call your doctor for medical advice about side effects. You may report side effects to FDA at 1-800-FDA-1088. Where should I keep my medication? This drug is given in a hospital or clinic and will not be stored at home. NOTE: This sheet is a summary. It may not cover all possible information. If you have questions about this medicine, talk to your doctor, pharmacist, or health care provider.  2022 Elsevier/Gold Standard (2018-11-11 12:20:35)  Fluorouracil, 5-FU injection What is this medication? FLUOROURACIL, 5-FU (flure oh YOOR a sil) is a chemotherapy drug. It slows the growth of cancer cells. This medicine is used to treat many types of cancer like breast cancer, colon or rectal cancer, pancreatic cancer, and stomach cancer. This medicine may be used for other purposes; ask your health care provider or pharmacist if you have questions. COMMON BRAND NAME(S): Adrucil What should I tell my care team before I take this medication? They need to know if you have  any of these conditions: blood disorders dihydropyrimidine dehydrogenase (DPD) deficiency infection (especially a virus infection such as chickenpox, cold sores, or herpes) kidney disease liver disease malnourished, poor nutrition recent or ongoing radiation therapy an unusual or allergic reaction to fluorouracil, other chemotherapy, other medicines, foods, dyes, or preservatives pregnant or trying to get pregnant breast-feeding How should I use this medication? This drug is given as an infusion or injection into a vein. It is administered in a hospital or clinic by a specially trained health care professional. Talk to your pediatrician regarding the use of this medicine in children. Special care may be needed. Overdosage: If you think you have taken too much of this medicine contact a poison control center or emergency room at once. NOTE: This medicine is only for you. Do not share this medicine with others. What if I miss a dose? It is important not to miss your dose. Call your doctor or health care professional if you are unable to keep an appointment. What may interact with this medication? Do not take this medicine with any of the following medications: live virus vaccines This medicine may also interact with the following medications: medicines that treat or prevent blood clots like warfarin, enoxaparin, and dalteparin This list may not describe all possible interactions. Give your health care provider a list of all the medicines, herbs, non-prescription drugs, or dietary supplements you use. Also tell them if you smoke, drink alcohol, or use illegal drugs. Some items may interact with your medicine. What should I watch for while using this medication? Visit your doctor for checks on your progress. This drug may make you feel generally unwell. This is not uncommon, as chemotherapy can affect healthy cells as well as cancer cells. Report any side effects. Continue your course of  treatment even though you feel ill unless your doctor tells you to stop. In some cases, you may be given additional medicines to help with side effects. Follow all directions for their use. Call your doctor or health care professional for advice if you get a fever, chills or sore throat, or other symptoms of a cold or flu. Do not treat yourself. This drug decreases your body's ability to fight infections. Try to avoid being around people who are sick. This medicine  may increase your risk to bruise or bleed. Call your doctor or health care professional if you notice any unusual bleeding. Be careful brushing and flossing your teeth or using a toothpick because you may get an infection or bleed more easily. If you have any dental work done, tell your dentist you are receiving this medicine. Avoid taking products that contain aspirin, acetaminophen, ibuprofen, naproxen, or ketoprofen unless instructed by your doctor. These medicines may hide a fever. Do not become pregnant while taking this medicine. Women should inform their doctor if they wish to become pregnant or think they might be pregnant. There is a potential for serious side effects to an unborn child. Talk to your health care professional or pharmacist for more information. Do not breast-feed an infant while taking this medicine. Men should inform their doctor if they wish to father a child. This medicine may lower sperm counts. Do not treat diarrhea with over the counter products. Contact your doctor if you have diarrhea that lasts more than 2 days or if it is severe and watery. This medicine can make you more sensitive to the sun. Keep out of the sun. If you cannot avoid being in the sun, wear protective clothing and use sunscreen. Do not use sun lamps or tanning beds/booths. What side effects may I notice from receiving this medication? Side effects that you should report to your doctor or health care professional as soon as possible: allergic  reactions like skin rash, itching or hives, swelling of the face, lips, or tongue low blood counts - this medicine may decrease the number of white blood cells, red blood cells and platelets. You may be at increased risk for infections and bleeding. signs of infection - fever or chills, cough, sore throat, pain or difficulty passing urine signs of decreased platelets or bleeding - bruising, pinpoint red spots on the skin, black, tarry stools, blood in the urine signs of decreased red blood cells - unusually weak or tired, fainting spells, lightheadedness breathing problems changes in vision chest pain mouth sores nausea and vomiting pain, swelling, redness at site where injected pain, tingling, numbness in the hands or feet redness, swelling, or sores on hands or feet stomach pain unusual bleeding Side effects that usually do not require medical attention (report to your doctor or health care professional if they continue or are bothersome): changes in finger or toe nails diarrhea dry or itchy skin hair loss headache loss of appetite sensitivity of eyes to the light stomach upset unusually teary eyes This list may not describe all possible side effects. Call your doctor for medical advice about side effects. You may report side effects to FDA at 1-800-FDA-1088. Where should I keep my medication? This drug is given in a hospital or clinic and will not be stored at home. NOTE: This sheet is a summary. It may not cover all possible information. If you have questions about this medicine, talk to your doctor, pharmacist, or health care provider.  2022 Elsevier/Gold Standard (2019-05-25 15:00:03)

## 2021-05-02 LAB — CEA: CEA: 111 ng/mL — ABNORMAL HIGH (ref 0.0–4.7)

## 2021-05-03 ENCOUNTER — Other Ambulatory Visit: Payer: Self-pay

## 2021-05-03 ENCOUNTER — Inpatient Hospital Stay: Payer: Medicare Other

## 2021-05-03 VITALS — BP 154/88 | HR 65

## 2021-05-03 DIAGNOSIS — C159 Malignant neoplasm of esophagus, unspecified: Secondary | ICD-10-CM

## 2021-05-03 DIAGNOSIS — Z5112 Encounter for antineoplastic immunotherapy: Secondary | ICD-10-CM | POA: Diagnosis not present

## 2021-05-03 MED ORDER — HEPARIN SOD (PORK) LOCK FLUSH 100 UNIT/ML IV SOLN
500.0000 [IU] | Freq: Once | INTRAVENOUS | Status: AC | PRN
  Filled 2021-05-03: qty 5

## 2021-05-03 MED ORDER — HEPARIN SOD (PORK) LOCK FLUSH 100 UNIT/ML IV SOLN
INTRAVENOUS | Status: AC
  Administered 2021-05-03: 500 [IU]
  Filled 2021-05-03: qty 5

## 2021-05-03 MED ORDER — PEGFILGRASTIM-CBQV 6 MG/0.6ML ~~LOC~~ SOSY
6.0000 mg | PREFILLED_SYRINGE | Freq: Once | SUBCUTANEOUS | Status: AC
  Administered 2021-05-03: 6 mg via SUBCUTANEOUS
  Filled 2021-05-03: qty 0.6

## 2021-05-03 MED ORDER — SODIUM CHLORIDE 0.9% FLUSH
10.0000 mL | INTRAVENOUS | Status: DC | PRN
  Administered 2021-05-03: 10 mL
  Filled 2021-05-03: qty 10

## 2021-05-08 ENCOUNTER — Other Ambulatory Visit: Payer: Self-pay | Admitting: *Deleted

## 2021-05-08 ENCOUNTER — Telehealth: Payer: Self-pay | Admitting: *Deleted

## 2021-05-08 DIAGNOSIS — C159 Malignant neoplasm of esophagus, unspecified: Secondary | ICD-10-CM

## 2021-05-08 DIAGNOSIS — H5789 Other specified disorders of eye and adnexa: Secondary | ICD-10-CM

## 2021-05-08 NOTE — Telephone Encounter (Signed)
Dr. Thomasene Ripple is ophthalmology. I am unable to reach. Please call his office and give him my cell number so he can call me when free

## 2021-05-08 NOTE — Telephone Encounter (Signed)
Dr Dingledein asking that Dr [name] call him to discuss this patient [phone] Ask for hi or for Dody his assistant

## 2021-05-15 ENCOUNTER — Encounter: Payer: Self-pay | Admitting: Oncology

## 2021-05-15 ENCOUNTER — Other Ambulatory Visit: Payer: Self-pay | Admitting: *Deleted

## 2021-05-15 ENCOUNTER — Other Ambulatory Visit: Payer: Self-pay

## 2021-05-15 ENCOUNTER — Inpatient Hospital Stay: Payer: Medicare Other | Attending: Oncology

## 2021-05-15 ENCOUNTER — Inpatient Hospital Stay (HOSPITAL_BASED_OUTPATIENT_CLINIC_OR_DEPARTMENT_OTHER): Payer: Medicare Other | Admitting: Oncology

## 2021-05-15 ENCOUNTER — Inpatient Hospital Stay: Payer: Medicare Other

## 2021-05-15 VITALS — BP 154/75 | HR 70 | Temp 97.6°F | Resp 16 | Ht 71.0 in | Wt 199.7 lb

## 2021-05-15 DIAGNOSIS — R1310 Dysphagia, unspecified: Secondary | ICD-10-CM | POA: Diagnosis not present

## 2021-05-15 DIAGNOSIS — Z5111 Encounter for antineoplastic chemotherapy: Secondary | ICD-10-CM | POA: Diagnosis present

## 2021-05-15 DIAGNOSIS — Z5189 Encounter for other specified aftercare: Secondary | ICD-10-CM | POA: Diagnosis not present

## 2021-05-15 DIAGNOSIS — R911 Solitary pulmonary nodule: Secondary | ICD-10-CM | POA: Insufficient documentation

## 2021-05-15 DIAGNOSIS — Z5112 Encounter for antineoplastic immunotherapy: Secondary | ICD-10-CM | POA: Diagnosis not present

## 2021-05-15 DIAGNOSIS — K219 Gastro-esophageal reflux disease without esophagitis: Secondary | ICD-10-CM | POA: Insufficient documentation

## 2021-05-15 DIAGNOSIS — J439 Emphysema, unspecified: Secondary | ICD-10-CM | POA: Diagnosis not present

## 2021-05-15 DIAGNOSIS — Z8719 Personal history of other diseases of the digestive system: Secondary | ICD-10-CM | POA: Insufficient documentation

## 2021-05-15 DIAGNOSIS — Z79899 Other long term (current) drug therapy: Secondary | ICD-10-CM | POA: Diagnosis not present

## 2021-05-15 DIAGNOSIS — C787 Secondary malignant neoplasm of liver and intrahepatic bile duct: Secondary | ICD-10-CM | POA: Insufficient documentation

## 2021-05-15 DIAGNOSIS — R609 Edema, unspecified: Secondary | ICD-10-CM | POA: Diagnosis not present

## 2021-05-15 DIAGNOSIS — Z800 Family history of malignant neoplasm of digestive organs: Secondary | ICD-10-CM | POA: Diagnosis not present

## 2021-05-15 DIAGNOSIS — C159 Malignant neoplasm of esophagus, unspecified: Secondary | ICD-10-CM | POA: Diagnosis not present

## 2021-05-15 DIAGNOSIS — Z87891 Personal history of nicotine dependence: Secondary | ICD-10-CM | POA: Insufficient documentation

## 2021-05-15 DIAGNOSIS — R5383 Other fatigue: Secondary | ICD-10-CM | POA: Diagnosis not present

## 2021-05-15 DIAGNOSIS — Z8049 Family history of malignant neoplasm of other genital organs: Secondary | ICD-10-CM | POA: Insufficient documentation

## 2021-05-15 DIAGNOSIS — Z8042 Family history of malignant neoplasm of prostate: Secondary | ICD-10-CM | POA: Diagnosis not present

## 2021-05-15 DIAGNOSIS — C155 Malignant neoplasm of lower third of esophagus: Secondary | ICD-10-CM | POA: Diagnosis present

## 2021-05-15 DIAGNOSIS — I700 Atherosclerosis of aorta: Secondary | ICD-10-CM | POA: Diagnosis not present

## 2021-05-15 DIAGNOSIS — M25511 Pain in right shoulder: Secondary | ICD-10-CM | POA: Insufficient documentation

## 2021-05-15 DIAGNOSIS — Z8249 Family history of ischemic heart disease and other diseases of the circulatory system: Secondary | ICD-10-CM | POA: Diagnosis not present

## 2021-05-15 DIAGNOSIS — Z808 Family history of malignant neoplasm of other organs or systems: Secondary | ICD-10-CM | POA: Diagnosis not present

## 2021-05-15 DIAGNOSIS — R161 Splenomegaly, not elsewhere classified: Secondary | ICD-10-CM | POA: Diagnosis not present

## 2021-05-15 DIAGNOSIS — Z7952 Long term (current) use of systemic steroids: Secondary | ICD-10-CM | POA: Insufficient documentation

## 2021-05-15 DIAGNOSIS — Z833 Family history of diabetes mellitus: Secondary | ICD-10-CM | POA: Insufficient documentation

## 2021-05-15 LAB — CBC WITH DIFFERENTIAL/PLATELET
Abs Immature Granulocytes: 0.03 10*3/uL (ref 0.00–0.07)
Basophils Absolute: 0 10*3/uL (ref 0.0–0.1)
Basophils Relative: 0 %
Eosinophils Absolute: 0.2 10*3/uL (ref 0.0–0.5)
Eosinophils Relative: 2 %
HCT: 36.1 % — ABNORMAL LOW (ref 39.0–52.0)
Hemoglobin: 11.9 g/dL — ABNORMAL LOW (ref 13.0–17.0)
Immature Granulocytes: 0 %
Lymphocytes Relative: 10 %
Lymphs Abs: 0.8 10*3/uL (ref 0.7–4.0)
MCH: 29.8 pg (ref 26.0–34.0)
MCHC: 33 g/dL (ref 30.0–36.0)
MCV: 90.5 fL (ref 80.0–100.0)
Monocytes Absolute: 0.5 10*3/uL (ref 0.1–1.0)
Monocytes Relative: 6 %
Neutro Abs: 6.7 10*3/uL (ref 1.7–7.7)
Neutrophils Relative %: 82 %
Platelets: 113 10*3/uL — ABNORMAL LOW (ref 150–400)
RBC: 3.99 MIL/uL — ABNORMAL LOW (ref 4.22–5.81)
RDW: 15 % (ref 11.5–15.5)
WBC: 8.2 10*3/uL (ref 4.0–10.5)
nRBC: 0 % (ref 0.0–0.2)

## 2021-05-15 LAB — COMPREHENSIVE METABOLIC PANEL
ALT: 24 U/L (ref 0–44)
AST: 30 U/L (ref 15–41)
Albumin: 3.8 g/dL (ref 3.5–5.0)
Alkaline Phosphatase: 192 U/L — ABNORMAL HIGH (ref 38–126)
Anion gap: 6 (ref 5–15)
BUN: 11 mg/dL (ref 8–23)
CO2: 24 mmol/L (ref 22–32)
Calcium: 8.8 mg/dL — ABNORMAL LOW (ref 8.9–10.3)
Chloride: 107 mmol/L (ref 98–111)
Creatinine, Ser: 0.71 mg/dL (ref 0.61–1.24)
GFR, Estimated: >60 mL/min (ref >60)
Glucose, Bld: 133 mg/dL — ABNORMAL HIGH (ref 70–99)
Potassium: 3.6 mmol/L (ref 3.5–5.1)
Sodium: 137 mmol/L (ref 135–145)
Total Bilirubin: 0.8 mg/dL (ref 0.3–1.2)
Total Protein: 6.4 g/dL — ABNORMAL LOW (ref 6.5–8.1)

## 2021-05-15 MED ORDER — ACETAMINOPHEN 325 MG PO TABS
650.0000 mg | ORAL_TABLET | Freq: Once | ORAL | Status: AC
  Administered 2021-05-15: 650 mg via ORAL
  Filled 2021-05-15: qty 2

## 2021-05-15 MED ORDER — LEUCOVORIN CALCIUM INJECTION 350 MG
850.0000 mg | Freq: Once | INTRAVENOUS | Status: AC
  Administered 2021-05-15: 850 mg via INTRAVENOUS
  Filled 2021-05-15: qty 42.5

## 2021-05-15 MED ORDER — DEXTROSE 5 % IV SOLN
Freq: Once | INTRAVENOUS | Status: AC
  Filled 2021-05-15: qty 250

## 2021-05-15 MED ORDER — TRASTUZUMAB-ANNS CHEMO 150 MG IV SOLR
4.0000 mg/kg | Freq: Once | INTRAVENOUS | Status: AC
  Administered 2021-05-15: 357 mg via INTRAVENOUS
  Filled 2021-05-15: qty 17

## 2021-05-15 MED ORDER — FLUOROURACIL CHEMO INJECTION 2.5 GM/50ML
400.0000 mg/m2 | Freq: Once | INTRAVENOUS | Status: AC
  Administered 2021-05-15: 850 mg via INTRAVENOUS
  Filled 2021-05-15: qty 17

## 2021-05-15 MED ORDER — SODIUM CHLORIDE 0.9 % IV SOLN
5000.0000 mg | INTRAVENOUS | Status: DC
  Administered 2021-05-15: 5000 mg via INTRAVENOUS
  Filled 2021-05-15: qty 100

## 2021-05-15 MED ORDER — OXYCODONE HCL 10 MG PO TABS: 10.0000 mg | ORAL_TABLET | ORAL | 0 refills | Status: DC | PRN

## 2021-05-15 MED ORDER — SODIUM CHLORIDE 0.9 % IV SOLN
INTRAVENOUS | Status: DC
  Filled 2021-05-15 (×2): qty 250

## 2021-05-15 MED ORDER — PALONOSETRON HCL INJECTION 0.25 MG/5ML
0.2500 mg | Freq: Once | INTRAVENOUS | Status: AC
  Administered 2021-05-15: 0.25 mg via INTRAVENOUS
  Filled 2021-05-15: qty 5

## 2021-05-15 MED ORDER — OXALIPLATIN CHEMO INJECTION 100 MG/20ML
65.0000 mg/m2 | Freq: Once | INTRAVENOUS | Status: AC
  Administered 2021-05-15: 140 mg via INTRAVENOUS
  Filled 2021-05-15: qty 20

## 2021-05-15 MED ORDER — DIPHENHYDRAMINE HCL 50 MG/ML IJ SOLN
50.0000 mg | Freq: Once | INTRAMUSCULAR | Status: AC
  Administered 2021-05-15: 50 mg via INTRAVENOUS
  Filled 2021-05-15: qty 1

## 2021-05-15 MED ORDER — SODIUM CHLORIDE 0.9 % IV SOLN
10.0000 mg | Freq: Once | INTRAVENOUS | Status: AC
  Administered 2021-05-15: 10 mg via INTRAVENOUS
  Filled 2021-05-15: qty 10

## 2021-05-15 MED ORDER — SODIUM CHLORIDE 0.9% FLUSH
10.0000 mL | Freq: Once | INTRAVENOUS | Status: AC
  Administered 2021-05-15: 10 mL via INTRAVENOUS
  Filled 2021-05-15: qty 10

## 2021-05-15 NOTE — Progress Notes (Signed)
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  He underwent EGD on 06/20/2020 By Dr. Vicente Males which showed a large nearly obstructing mass in the lower third of the esophagus 35 cm from incisors.  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  PD-l1 <1 %, TMB HIGH    Presently patient reports dysphagia but is able to eat cereal or soft food.  He has lost about 13 pounds in the last month itself.  Denies any significant pain.  He recently retired after many years of service and is otherwise independent of his ADLs and IADLs.     PET CT scan on 06/26/2020 showed circumferential distal esophageal mass with an SUV of 13.3 extending over 5.5 cm. Right lower paratracheal node is 0.6 cm with an SUV of 3.1. Hypermetabolic right gastric lymph nodes including 1.1 cm node with an SUV of 5.8. Numerous hypermetabolic lesions throughout the liver somewhat confluent around the periphery which were hypermetabolic between 8 and 9. Faintly hypermetabolic left adrenal gland   NGS testing showed high tumor mutational burden CPS score less than 1. CD9-NRG1 fusion. BRAF 581L, CCN E1 gain.  EMLA for fusion of BX [id number], FGF 23 gain, FGF 6 gain, MET R988C, T p53 S1 48F. ERBB2 gain but no other actionable mutations   Patient currently  on first-line FOLFOX/trastuzumab/Keytruda.  He gets Keytruda every 6 weeks    Interval history-reports worsening right shoulder pain and oxycodone 5 mg every 8 hours as needed is not helping.  Denies any visual complaints at this time other than occasional floaters  ECOG PS- 1 Pain scale- 5 Opioid associated constipation- no  Review of systems- Review of Systems  Constitutional:  Positive for malaise/fatigue. Negative for chills, fever and weight loss.  HENT:  Negative for congestion, ear discharge and nosebleeds.   Eyes:  Negative for blurred vision.  Respiratory:  Negative for cough, hemoptysis, sputum production, shortness of breath and wheezing.   Cardiovascular:  Negative for chest pain, palpitations, orthopnea and claudication.  Gastrointestinal:  Negative for abdominal pain, blood in stool, constipation, diarrhea, heartburn, melena, nausea and vomiting.  Genitourinary:  Negative for dysuria, flank pain, frequency, hematuria and urgency.  Musculoskeletal:  Positive for joint pain (Right shoulder pain). Negative for back pain and myalgias.  Skin:  Negative for rash.  Neurological:  Negative for dizziness, tingling, focal weakness, seizures, weakness and headaches.  Endo/Heme/Allergies:  Does not bruise/bleed easily.  Psychiatric/Behavioral:  Negative for depression and suicidal ideas. The patient does not have insomnia.       No Known Allergies   Past Medical History:  Diagnosis Date   Esophageal adenocarcinoma ([place]) 06/23/2020   Family history of brain cancer    Family history of colon cancer    Family history of melanoma    Family history of stomach cancer    GERD (gastroesophageal reflux disease)    History of kidney stones  Nephrolithiasis    [facility]   Pancreatitis, acute    Personal history of colonic polyps    Dr Nicolasa Ducking   Psoriasis      Past Surgical History:  Procedure Laterality Date   COLONOSCOPY     COLONOSCOPY WITH PROPOFOL N/A 03/31/2020    Procedure: COLONOSCOPY WITH PROPOFOL;  Surgeon: Jonathon Bellows, MD;  Location: [facility] ENDOSCOPY;  Service: Gastroenterology;  Laterality: N/A;   ERCP     ESOPHAGOGASTRODUODENOSCOPY (EGD) WITH PROPOFOL N/A 06/19/2020   Procedure: ESOPHAGOGASTRODUODENOSCOPY (EGD) WITH PROPOFOL;  Surgeon: Jonathon Bellows, MD;  Location: [facility] ENDOSCOPY;  Service: Gastroenterology;  Laterality: N/A;   groin surgery     as a child   PORTA CATH INSERTION N/A 06/28/2020   Procedure: PORTA CATH INSERTION;  Surgeon: Algernon Huxley, MD;  Location: [place] CV LAB;  Service: Cardiovascular;  Laterality: N/A;   SHOULDER SURGERY      Social History   Socioeconomic History   Marital status: Married    Spouse name: Not on file   Number of children: 2   Years of education: Not on file   Highest education level: Not on file  Occupational History   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  Tobacco Use   Smoking status: Former    Types: Cigarettes    Quit date: 07/08/1988    Years since quitting: 32.8   Smokeless tobacco: Never  Vaping Use   Vaping Use: Never used  Substance and Sexual Activity   Alcohol use: No    Comment: recovering alcoholic for 13 years   Drug use: No   Sexual activity: Not Currently  Other Topics Concern   Not on file  Social History Narrative   Has living will--needs to get notarized   Daughter should be health care POA (wife with dementia)   Would accept resuscitation   Would accept tube feeds depending on the circumstance   Social Determinants of Health   Financial Resource Strain: Not on file  Food Insecurity: Not on file  Transportation Needs: Not on file  Physical Activity: Not on file  Stress: Not on file  Social Connections: Not on file  Intimate Partner Violence: Not on file    Family History  Problem Relation Age of Onset   Stomach cancer Mother    Diabetes Brother    Hypertension Brother    Hypertension Father    Prostate cancer Father    Colon  cancer Sister    Cancer Other        either cervical or ovarian, d. 61s   Brain cancer Sister      Current Outpatient Medications:    acetaminophen (TYLENOL) 325 MG tablet, Take 650 mg by mouth every 6 (six) hours as needed., Disp: , Rfl:    celecoxib (CELEBREX) 200 MG capsule, celecoxib 200 mg capsule, Disp: , Rfl:    dexamethasone (DECADRON) 4 MG tablet, Take 2 tablets (8 mg total) by mouth daily. Start the day after chemotherapy for 2 days. Take with food., Disp: 30 tablet, Rfl: 1   lidocaine-prilocaine (EMLA) cream, Apply 1 application topically as needed. Apply small amount to port site at least 1 hour prior to it being accessed, cover with plastic wrap, Disp: 30 g, Rfl: 3   LORazepam (ATIVAN) 0.5 MG tablet, Take 1 tablet (0.5 mg total) by mouth every 6 (six) hours as needed (Nausea or vomiting). (Patient not taking: No sig reported), Disp: 30 tablet, Rfl: 0   ondansetron (ZOFRAN)  8 MG tablet, Take 1 tablet (8 mg total) by mouth 2 (two) times daily as needed for refractory nausea / vomiting. Start on day 3 after chemotherapy. (Patient not taking: No sig reported), Disp: 30 tablet, Rfl: 1   oxyCODONE (OXY IR/ROXICODONE) 5 MG immediate release tablet, TAKE 1 TABLET BY MOUTH EVERY 8 HOURS AS NEEDED FOR SEVERE PAIN, Disp: 60 tablet, Rfl: 0   oxyCODONE-acetaminophen (PERCOCET/ROXICET) 5-325 MG tablet, Take 1 tablet by mouth every 6 (six) hours as needed for severe pain., Disp: 20 tablet, Rfl: 0   pantoprazole (PROTONIX) 40 MG tablet, TAKE 1 TABLET BY MOUTH TWICE A DAY BEFORE MEALS, Disp: 60 tablet, Rfl: 11   prochlorperazine (COMPAZINE) 10 MG tablet, Take 1 tablet (10 mg total) by mouth every 6 (six) hours as needed (Nausea or vomiting). (Patient not taking: No sig reported), Disp: 30 tablet, Rfl: 1   triamcinolone cream (KENALOG) 0.1 %, Apply 1 application topically 3 (three) times daily as needed., Disp: 300 each, Rfl: 1 No current facility-administered medications for this  visit.  Facility-Administered Medications Ordered in Other Visits:    sodium chloride flush (NS) 0.9 % injection 10 mL, 10 mL, Intracatheter, PRN, [name], MD, 10 mL at 01/04/21 1149  Physical exam:  Vitals:   05/15/21 0857  BP: (!) 154/75  Pulse: 70  Resp: 16  Temp: 97.6 F (36.4 C)  TempSrc: Oral  Weight: 199 lb 11.2 oz (90.6 kg)  Height: _0  (1.803 m)   Physical Exam Cardiovascular:     Rate and Rhythm: Normal rate and regular rhythm.     Heart sounds: Normal heart sounds.  Pulmonary:     Effort: Pulmonary effort is normal.     Breath sounds: Normal breath sounds.  Skin:    General: Skin is warm and dry.  Neurological:     Mental Status: He is alert and oriented to person, place, and time.     CMP Latest Ref Rng & Units 05/01/2021  Glucose 70 - 99 mg/dL 119(H)  BUN 8 - 23 mg/dL 18  Creatinine 0.61 - 1.24 mg/dL 0.89  Sodium 135 - 145 mmol/L 137  Potassium 3.5 - 5.1 mmol/L 3.9  Chloride 98 - 111 mmol/L 104  CO2 22 - 32 mmol/L 25  Calcium 8.9 - 10.3 mg/dL 9.3  Total Protein 6.5 - 8.1 g/dL 6.8  Total Bilirubin 0.3 - 1.2 mg/dL 1.1  Alkaline Phos 38 - 126 U/L 174(H)  AST 15 - 41 U/L 28  ALT 0 - 44 U/L 20   CBC Latest Ref Rng & Units 05/01/2021  WBC 4.0 - 10.5 K/uL 6.2  Hemoglobin 13.0 - 17.0 g/dL 12.1(L)  Hematocrit 39.0 - 52.0 % 36.8(L)  Platelets 150 - 400 K/uL 131(L)    No images are attached to the encounter.  CT CHEST ABDOMEN PELVIS W CONTRAST  Result Date: 04/16/2021 CLINICAL DATA:  Metastatic esophageal cancer restaging, metastases to liver,, status post radiation therapy, ongoing chemotherapy EXAM: CT CHEST, ABDOMEN, AND PELVIS WITH CONTRAST TECHNIQUE: Multidetector CT imaging of the chest, abdomen and pelvis was performed following the standard protocol during bolus administration of intravenous contrast. CONTRAST:  127m OMNIPAQUE IOHEXOL 350 MG/ML SOLN, additional oral enteric contrast COMPARISON:  01/09/2021 FINDINGS: CT CHEST FINDINGS  Cardiovascular: Right chest port catheter. Aortic atherosclerosis. Normal heart size. No pericardial effusion. Mediastinum/Nodes: No enlarged mediastinal, hilar, or axillary lymph nodes. Small hiatal hernia. Unchanged circumferential thickening of the lower esophagus (series 2, image 52) thyroid gland and trachea demonstrate no  significant findings. Lungs/Pleura: Mild centrilobular and paraseptal emphysema. There is a new, somewhat spiculated appearing nodular opacity of the superior segment left lower lobe, measuring 1.6 x 1.0 cm (series 3, image 95). Fibrosis of the paramedian right lower lobe related to disc osteophytes and radiation therapy (series 3, image 127). No pleural effusion or pneumothorax. Musculoskeletal: No chest wall mass or suspicious bone lesions identified. CT ABDOMEN PELVIS FINDINGS Hepatobiliary: Somewhat coarse, nodular contour of the liver. Numerous, very subtle low-attenuation lesions throughout the liver parenchyma are difficult to discretely measure, however not significantly changed compared to prior examination, largest index lesion in the anterior left lobe measuring 2.4 x 1.2 cm (series 2, image 62). No gallstones, gallbladder wall thickening, or biliary dilatation. Pancreas: Unremarkable. No pancreatic ductal dilatation or surrounding inflammatory changes. Spleen: Unchanged splenomegaly, maximum coronal span 15.7 cm. Adrenals/Urinary Tract: Adrenal glands are unremarkable. Punctuate nonobstructive calculus of the inferior pole of the right kidney (series 4, image 96). No left-sided calculi or hydronephrosis. Bladder is unremarkable. Stomach/Bowel: Stomach is within normal limits. Appendix appears normal. No evidence of bowel wall thickening, distention, or inflammatory changes. Descending and sigmoid diverticula. Vascular/Lymphatic: Aortic atherosclerosis. No enlarged abdominal or pelvic lymph nodes. Reproductive: No mass or other abnormality. Other: No abdominal wall hernia or  abnormality. No abdominopelvic ascites. Musculoskeletal: No acute or significant osseous findings. Unchanged, Schmorl type superior endplate deformities of L3 and L5 (series 6, image 95). IMPRESSION: 1. Numerous, very subtle low-attenuation lesions throughout the liver parenchyma are difficult to discretely measure, however not significantly changed compared to prior examination,, consistent with stable hepatic metastatic disease. 2. Unchanged circumferential thickening of the lower esophagus, in keeping with treated primary esophageal malignancy. 3. There is a new, somewhat spiculated appearing nodular opacity of the superior segment left lower lobe, measuring 1.6 x 1.0 cm. Given relatively rapid evolution in the interval to prior examination, morphology, and location, nonspecific infectious or inflammatory etiology is favored, although metastasis or metachronous primary lung malignancy are not strictly excluded. Attention on follow-up. 4. Emphysema. 5. Somewhat coarse, nodular contour of the liver, which may reflect either cirrhosis or pseudocirrhosis in the setting of treated hepatic metastatic disease 6. Unchanged splenomegaly. 7. Nonobstructive right nephrolithiasis. Aortic Atherosclerosis (ICD10-I70.0) and Emphysema (ICD10-J43.9). Electronically Signed   By: Delanna Ahmadi M.D.   On: 04/16/2021 21:47     Assessment and plan- Patient is a 65 y.o. male with history of adenocarcinoma of esophagus with liver metastases.  He is here for on treatment assessment prior to cycle 23 of FOLFOX trastuzumab chemotherapy  Counts okay to proceed with cycle 23 of FOLFOX trastuzumab chemotherapy today.  He is not due for Keytruda until 4 weeks from now.  His CEA is rapidly increasing and is up to 111 today as compared to 9.93 months ago.  Patient was also complaining of floaters in his eye and had referred him to ophthalmology.  I did speak to Dr. Sandra Cockayne who performed his retinal exam and there is a concern for retinal  met.  He is being referred to [facility] ophthalmology to see if there would be role for any intervention.  In the meanwhile I am planning to get a PET scan in 1 week's time and he is due for an MRI brain with and without contrast tomorrow.  I suspect as compared to his CT scan in October 2022 scans are likely to show significant progression  Patient does have the NRG1 gene fusion and he could potentially be a candidate for Seribantumab which is a  study drug and the study is likely to open in late November at [facility] in [place].  That would be my preference to pursue this study instead of going to [facility] for a second opinion at this time. Outside of clinical trial options include docetaxel plus ramucirumab  Patient is getting MRI brain tomorrow as well as PET CT scan next week.  I will reach out to [facility] center principal investigator Dr. Samuel Jester and see if patient can qualify for the study  Follow-up with me to be decided based on PET scan and MRI results  Right shoulder pain: Likely unrelated to malignancy and secondary to recent fracture.  I will increase his oxycodone to 10 mg every 4 hours as needed Visit Diagnosis 1. Encounter for antineoplastic chemotherapy   2. Encounter for monoclonal antibody treatment for malignancy   3. Esophageal adenocarcinoma ([place])      Dr. Randa Evens, MD, MPH [facility] [phone] 05/15/2021 8:31 AM

## 2021-05-15 NOTE — Progress Notes (Signed)
Pt has had diarrhea for 3-4 days last week, did not have a good appetite. He makes breakfast every day , he drinks boost every day. He is having pain in right arm today . Needs refill for pain med and change to have it more often

## 2021-05-15 NOTE — Patient Instructions (Signed)
[name]  Discharge Instructions: Thank you for choosing [facility] to provide your oncology and hematology care.  If you have a lab appointment with the [facility], please go directly to the [facility] and check in at the registration area.  Wear comfortable clothing and clothing appropriate for easy access to any Portacath or PICC line.   We strive to give you quality time with your provider. You may need to reschedule your appointment if you arrive late (15 or more minutes).  Arriving late affects you and other patients whose appointments are after yours.  Also, if you miss three or more appointments without notifying the office, you may be dismissed from the clinic at the provider's discretion.      For prescription refill requests, have your pharmacy contact our office and allow 72 hours for refills to be completed.    Today you received the following chemotherapy and/or immunotherapy agents: [place], FOLFOX      To help prevent nausea and vomiting after your treatment, we encourage you to take your nausea medication as directed.  BELOW ARE SYMPTOMS THAT SHOULD BE REPORTED IMMEDIATELY: *FEVER GREATER THAN 100.4 F (38 C) OR HIGHER *CHILLS OR SWEATING *NAUSEA AND VOMITING THAT IS NOT CONTROLLED WITH YOUR NAUSEA MEDICATION *UNUSUAL SHORTNESS OF BREATH *UNUSUAL BRUISING OR BLEEDING *URINARY PROBLEMS (pain or burning when urinating, or frequent urination) *BOWEL PROBLEMS (unusual diarrhea, constipation, pain near the anus) TENDERNESS IN MOUTH AND THROAT WITH OR WITHOUT PRESENCE OF ULCERS (sore throat, sores in mouth, or a toothache) UNUSUAL RASH, SWELLING OR PAIN  UNUSUAL VAGINAL DISCHARGE OR ITCHING   Items with * indicate a potential emergency and should be followed up as soon as possible or go to the Emergency Department if any problems should occur.  Please show the CHEMOTHERAPY ALERT CARD or IMMUNOTHERAPY ALERT CARD at  check-in to the Emergency Department and triage nurse.  Should you have questions after your visit or need to cancel or reschedule your appointment, please contact [facility]  [phone] and follow the prompts.  Office hours are 8:00 a.m. to 4:30 p.m. Monday - Friday. Please note that voicemails left after 4:00 p.m. may not be returned until the following business day.  We are closed weekends and major holidays. You have access to a nurse at all times for urgent questions. Please call the main number to the clinic [phone] and follow the prompts.  For any non-urgent questions, you may also contact your provider using MyChart. We now offer e-Visits for anyone 66 and older to request care online for non-urgent symptoms. For details visit [email or web address].   Also download the MyChart app! Go to the app store, search "MyChart", open the app, select [facility], and log in with your MyChart username and password.  Due to Covid, a mask is required upon entering the hospital/clinic. If you do not have a mask, one will be given to you upon arrival. For doctor visits, patients may have 1 support person aged 66 or older with them. For treatment visits, patients cannot have anyone with them due to current Covid guidelines and our immunocompromised population.

## 2021-05-16 ENCOUNTER — Ambulatory Visit
Admission: RE | Admit: 2021-05-16 | Discharge: 2021-05-16 | Disposition: A | Discharge status: Home / Self Care | Payer: Medicare Other | Source: Ambulatory Visit | Attending: Oncology | Admitting: Oncology

## 2021-05-16 DIAGNOSIS — H5789 Other specified disorders of eye and adnexa: Secondary | ICD-10-CM | POA: Insufficient documentation

## 2021-05-16 DIAGNOSIS — C159 Malignant neoplasm of esophagus, unspecified: Secondary | ICD-10-CM | POA: Insufficient documentation

## 2021-05-16 MED ORDER — GADOBUTROL 1 MMOL/ML IV SOLN
9.0000 mL | Freq: Once | INTRAVENOUS | Status: AC | PRN
  Administered 2021-05-16: 9 mL via INTRAVENOUS

## 2021-05-17 ENCOUNTER — Inpatient Hospital Stay: Payer: Medicare Other

## 2021-05-17 ENCOUNTER — Other Ambulatory Visit: Payer: Self-pay

## 2021-05-17 VITALS — BP 157/75 | HR 64 | Temp 97.7°F | Resp 18

## 2021-05-17 DIAGNOSIS — Z5112 Encounter for antineoplastic immunotherapy: Secondary | ICD-10-CM | POA: Diagnosis not present

## 2021-05-17 DIAGNOSIS — C159 Malignant neoplasm of esophagus, unspecified: Secondary | ICD-10-CM

## 2021-05-17 MED ORDER — HEPARIN SOD (PORK) LOCK FLUSH 100 UNIT/ML IV SOLN
500.0000 [IU] | Freq: Once | INTRAVENOUS | Status: AC | PRN
  Filled 2021-05-17: qty 5

## 2021-05-17 MED ORDER — SODIUM CHLORIDE 0.9% FLUSH
10.0000 mL | INTRAVENOUS | Status: DC | PRN
  Administered 2021-05-17: 10 mL
  Filled 2021-05-17: qty 10

## 2021-05-17 MED ORDER — HEPARIN SOD (PORK) LOCK FLUSH 100 UNIT/ML IV SOLN
INTRAVENOUS | Status: AC
  Administered 2021-05-17: 500 [IU]
  Filled 2021-05-17: qty 5

## 2021-05-17 MED ORDER — PEGFILGRASTIM-CBQV 6 MG/0.6ML ~~LOC~~ SOSY
6.0000 mg | PREFILLED_SYRINGE | Freq: Once | SUBCUTANEOUS | Status: AC
  Administered 2021-05-17: 6 mg via SUBCUTANEOUS
  Filled 2021-05-17: qty 0.6

## 2021-05-17 NOTE — Progress Notes (Signed)
Pt arrived to clinic for home pump d/c and udenyca injection. VSS @ d/c.

## 2021-05-17 NOTE — Patient Instructions (Signed)
[facility]   Discharge Instructions: Thank you for choosing [facility] to provide your oncology and hematology care.  If you have a lab appointment with the [facility], please go directly to the [facility] and check in at the registration area.  Wear comfortable clothing and clothing appropriate for easy access to any Portacath or PICC line.   We strive to give you quality time with your provider. You may need to reschedule your appointment if you arrive late (15 or more minutes).  Arriving late affects you and other patients whose appointments are after yours.  Also, if you miss three or more appointments without notifying the office, you may be dismissed from the clinic at the provider's discretion.      For prescription refill requests, have your pharmacy contact our office and allow 72 hours for refills to be completed.     To help prevent nausea and vomiting after your treatment, we encourage you to take your nausea medication as directed.  BELOW ARE SYMPTOMS THAT SHOULD BE REPORTED IMMEDIATELY: *FEVER GREATER THAN 100.4 F (38 C) OR HIGHER *CHILLS OR SWEATING *NAUSEA AND VOMITING THAT IS NOT CONTROLLED WITH YOUR NAUSEA MEDICATION *UNUSUAL SHORTNESS OF BREATH *UNUSUAL BRUISING OR BLEEDING *URINARY PROBLEMS (pain or burning when urinating, or frequent urination) *BOWEL PROBLEMS (unusual diarrhea, constipation, pain near the anus) TENDERNESS IN MOUTH AND THROAT WITH OR WITHOUT PRESENCE OF ULCERS (sore throat, sores in mouth, or a toothache) UNUSUAL RASH, SWELLING OR PAIN  UNUSUAL VAGINAL DISCHARGE OR ITCHING   Items with * indicate a potential emergency and should be followed up as soon as possible or go to the Emergency Department if any problems should occur.  Please show the CHEMOTHERAPY ALERT CARD or IMMUNOTHERAPY ALERT CARD at check-in to the Emergency Department and triage nurse.  Should you have questions after your  visit or need to cancel or reschedule your appointment, please contact [facility]  [phone] and follow the prompts.  Office hours are 8:00 a.m. to 4:30 p.m. Monday - Friday. Please note that voicemails left after 4:00 p.m. may not be returned until the following business day.  We are closed weekends and major holidays. You have access to a nurse at all times for urgent questions. Please call the main number to the clinic [phone] and follow the prompts.  For any non-urgent questions, you may also contact your provider using MyChart. We now offer e-Visits for anyone 36 and older to request care online for non-urgent symptoms. For details visit [email or web address].   Also download the MyChart app! Go to the app store, search "MyChart", open the app, select [facility], and log in with your MyChart username and password.  Due to Covid, a mask is required upon entering the hospital/clinic. If you do not have a mask, one will be given to you upon arrival. For doctor visits, patients may have 1 support person aged 61 or older with them. For treatment visits, patients cannot have anyone with them due to current Covid guidelines and our immunocompromised population.   Pegfilgrastim Injection What is this medication? PEGFILGRASTIM (PEG fil gra stim) lowers the risk of infection in people who are receiving chemotherapy. It works by Building control surveyor make more Cory [name] blood cells, which protects your body from infection. It may also be used to help people who have been exposed to high doses of radiation. This medicine may be used for other purposes; ask your health care  provider or pharmacist if you have questions. COMMON BRAND NAME(S): Rexene Edison, Ziextenzo What should I tell my care team before I take this medication? They need to know if you have any of these conditions: Kidney disease Latex allergy Ongoing radiation therapy Sickle  cell disease Skin reactions to acrylic adhesives (On-Body Injector only) An unusual or allergic reaction to pegfilgrastim, filgrastim, other medications, foods, dyes, or preservatives Pregnant or trying to get pregnant Breast-feeding How should I use this medication? This medication is for injection under the skin. If you get this medication at home, you will be taught how to prepare and give the pre-filled syringe or how to use the On-body Injector. Refer to the patient Instructions for Use for detailed instructions. Use exactly as directed. Tell your care team immediately if you suspect that the On-body Injector may not have performed as intended or if you suspect the use of the On-body Injector resulted in a missed or partial dose. It is important that you put your used needles and syringes in a special sharps container. Do not put them in a trash can. If you do not have a sharps container, call your pharmacist or care team to get one. Talk to your care team about the use of this medication in children. While this medication may be prescribed for selected conditions, precautions do apply. Overdosage: If you think you have taken too much of this medicine contact a poison control center or emergency room at once. NOTE: This medicine is only for you. Do not share this medicine with others. What if I miss a dose? It is important not to miss your dose. Call your care team if you miss your dose. If you miss a dose due to an On-body Injector failure or leakage, a new dose should be administered as soon as possible using a single prefilled syringe for manual use. What may interact with this medication? Interactions have not been studied. This list may not describe all possible interactions. Give your health care provider a list of all the medicines, herbs, non-prescription drugs, or dietary supplements you use. Also tell them if you smoke, drink alcohol, or use illegal drugs. Some items may interact with  your medicine. What should I watch for while using this medication? Your condition will be monitored carefully while you are receiving this medication. You may need blood work done while you are taking this medication. Talk to your care team about your risk of cancer. You may be more at risk for certain types of cancer if you take this medication. If you are going to need a MRI, CT scan, or other procedure, tell your care team that you are using this medication (On-Body Injector only). What side effects may I notice from receiving this medication? Side effects that you should report to your care team as soon as possible: Allergic reactions--skin rash, itching, hives, swelling of the face, lips, tongue, or throat Capillary leak syndrome--stomach or muscle pain, unusual weakness or fatigue, feeling faint or lightheaded, decrease in the amount of urine, swelling of the ankles, hands, or feet, trouble breathing High [place] blood cell level--fever, fatigue, trouble breathing, night sweats, change in vision, weight loss Inflammation of the aorta--fever, fatigue, back, chest, or stomach pain, severe headache Kidney injury (glomerulonephritis)--decrease in the amount of urine, red or dark brown urine, foamy or bubbly urine, swelling of the ankles, hands, or feet Shortness of breath or trouble breathing Spleen injury--pain in upper left stomach or shoulder Unusual bruising or  bleeding Side effects that usually do not require medical attention (report to your care team if they continue or are bothersome): Bone pain Pain in the hands or feet This list may not describe all possible side effects. Call your doctor for medical advice about side effects. You may report side effects to FDA at 1-800-FDA-1088. Where should I keep my medication? Keep out of the reach of children. If you are using this medication at home, you will be instructed on how to store it. Throw away any unused medication after the  expiration date on the label. NOTE: This sheet is a summary. It may not cover all possible information. If you have questions about this medicine, talk to your doctor, pharmacist, or health care provider.  2022 [employer] Standard (2021-03-13 00:00:00)

## 2021-05-21 ENCOUNTER — Other Ambulatory Visit: Payer: Self-pay

## 2021-05-21 ENCOUNTER — Ambulatory Visit
Admission: RE | Admit: 2021-05-21 | Discharge: 2021-05-21 | Disposition: A | Discharge status: Home / Self Care | Payer: Medicare Other | Source: Ambulatory Visit | Attending: Oncology | Admitting: Oncology

## 2021-05-21 DIAGNOSIS — C159 Malignant neoplasm of esophagus, unspecified: Secondary | ICD-10-CM | POA: Diagnosis not present

## 2021-05-21 DIAGNOSIS — Z79899 Other long term (current) drug therapy: Secondary | ICD-10-CM | POA: Insufficient documentation

## 2021-05-21 DIAGNOSIS — I10 Essential (primary) hypertension: Secondary | ICD-10-CM | POA: Diagnosis not present

## 2021-05-21 DIAGNOSIS — I340 Nonrheumatic mitral (valve) insufficiency: Secondary | ICD-10-CM | POA: Diagnosis not present

## 2021-05-21 LAB — ECHOCARDIOGRAM COMPLETE
AR max vel: 1.82 cm2
AV Area VTI: 1.95 cm2
AV Area mean vel: 1.8 cm2
AV Mean grad: 3 mmHg
AV Peak grad: 6 mmHg
Ao pk vel: 1.22 m/s
Area-P 1/2: 3.24 cm2
Calc EF: 45 %
MV VTI: 1.4 cm2
S' Lateral: 4.15 cm
Single Plane A2C EF: 47.6 %
Single Plane A4C EF: 45.1 %

## 2021-05-21 NOTE — Progress Notes (Signed)
*  PRELIMINARY RESULTS* Echocardiogram 2D Echocardiogram has been performed.  Sherrie Sport 05/21/2021, 10:12 AM

## 2021-05-22 ENCOUNTER — Ambulatory Visit
Admission: RE | Admit: 2021-05-22 | Discharge: 2021-05-22 | Disposition: A | Discharge status: Home / Self Care | Payer: Medicare Other | Source: Ambulatory Visit | Attending: Oncology | Admitting: Oncology

## 2021-05-22 DIAGNOSIS — H579 Unspecified disorder of eye and adnexa: Secondary | ICD-10-CM | POA: Diagnosis not present

## 2021-05-22 DIAGNOSIS — C159 Malignant neoplasm of esophagus, unspecified: Secondary | ICD-10-CM | POA: Diagnosis not present

## 2021-05-22 DIAGNOSIS — H5789 Other specified disorders of eye and adnexa: Secondary | ICD-10-CM

## 2021-05-22 LAB — GLUCOSE, CAPILLARY: Glucose-Capillary: 99 mg/dL (ref 70–99)

## 2021-05-22 MED ORDER — FLUDEOXYGLUCOSE F - 18 (FDG) INJECTION
10.6000 | Freq: Once | INTRAVENOUS | Status: AC | PRN
  Administered 2021-05-22: 10.6 via INTRAVENOUS

## 2021-05-23 ENCOUNTER — Inpatient Hospital Stay (HOSPITAL_BASED_OUTPATIENT_CLINIC_OR_DEPARTMENT_OTHER): Payer: Medicare Other | Admitting: Oncology

## 2021-05-23 ENCOUNTER — Other Ambulatory Visit: Payer: Self-pay

## 2021-05-23 ENCOUNTER — Encounter: Payer: Self-pay | Admitting: Oncology

## 2021-05-23 DIAGNOSIS — C159 Malignant neoplasm of esophagus, unspecified: Secondary | ICD-10-CM

## 2021-05-23 NOTE — Progress Notes (Signed)
I connected with Sheldon Silvan on 05/23/21 at  2:00 PM EST by telephone visit and verified that I am speaking with the correct person using two identifiers.  Attempt was made for a face-to-face video visit.  Patient could hear me and see me but I could not and therefore I had to switch to telephone call   I discussed the limitations, risks, security and privacy concerns of performing an evaluation and management service by telemedicine and the availability of in-person appointments. I also discussed with the patient that there may be a patient responsible charge related to this service. The patient expressed understanding and agreed to proceed.  Other persons participating in the visit and their role in the encounter: Patient's daughter  Patient's location:  home Provider's location:  work  Stage manager Complaint: Discuss PET CT scan and MRI results and further management  History of present illness: Patient is a 65 year old male who presented with symptoms of indigestion and heartburn which gradually progressed to dysphagia.  He underwent EGD on 06/20/2020 By Dr. Tobi Bastos which showed a large nearly obstructing mass in the lower third of the esophagus 35 cm from incisors.  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  PD-l1 <1 %, TMB HIGH    Presently patient reports dysphagia but is able to eat cereal or soft food.  He has lost about 13 pounds in the last month itself.  Denies any significant pain.  He recently retired after many years of service and is otherwise independent of his ADLs and IADLs.     PET CT scan on 06/26/2020 showed circumferential distal esophageal mass with an SUV of 13.3 extending over 5.5 cm. Right lower paratracheal node is 0.6 cm with an SUV of 3.1. Hypermetabolic right gastric lymph nodes including 1.1 cm node with an SUV of 5.8. Numerous hypermetabolic lesions throughout the liver somewhat confluent around the periphery which were hypermetabolic between 8  and 9. Faintly hypermetabolic left adrenal gland   NGS testing showed high tumor mutational burden CPS score less than 1. CD9-NRG1 fusion. BRAF 581L, CCN E1 gain.  EMLA for fusion of BX [id number], FGF 23 gain, FGF 6 gain, MET R988C, T p53 S1 12F. ERBB2 gain but no other actionable mutations   Patient currently on first-line FOLFOX/trastuzumab/Keytruda.  He gets Keytruda every 6 weeks  Interval history overall patient is doing well and denies any new complaints at this time   Review of Systems  Constitutional:  Positive for malaise/fatigue. Negative for chills, fever and weight loss.  HENT:  Negative for congestion, ear discharge and nosebleeds.   Eyes:  Negative for blurred vision.  Respiratory:  Negative for cough, hemoptysis, sputum production, shortness of breath and wheezing.   Cardiovascular:  Negative for chest pain, palpitations, orthopnea and claudication.  Gastrointestinal:  Negative for abdominal pain, blood in stool, constipation, diarrhea, heartburn, melena, nausea and vomiting.  Genitourinary:  Negative for dysuria, flank pain, frequency, hematuria and urgency.  Musculoskeletal:  Negative for back pain, joint pain and myalgias.  Skin:  Negative for rash.  Neurological:  Negative for dizziness, tingling, focal weakness, seizures, weakness and headaches.  Endo/Heme/Allergies:  Does not bruise/bleed easily.  Psychiatric/Behavioral:  Negative for depression and suicidal ideas. The patient does not have insomnia.    No Known Allergies  Past Medical History:  Diagnosis Date   Esophageal adenocarcinoma (HCC) 06/23/2020   Family history of brain cancer    Family history of colon cancer    Family history of melanoma  Family history of stomach cancer    GERD (gastroesophageal reflux disease)    History of kidney stones    Nephrolithiasis    [facility]   Pancreatitis, acute    Personal history of colonic polyps    Dr Maryruth Bun   Psoriasis     Past Surgical History:  Procedure  Laterality Date   COLONOSCOPY     COLONOSCOPY WITH PROPOFOL N/A 03/31/2020   Procedure: COLONOSCOPY WITH PROPOFOL;  Surgeon: Wyline Mood, MD;  Location: [facility] ENDOSCOPY;  Service: Gastroenterology;  Laterality: N/A;   ERCP     ESOPHAGOGASTRODUODENOSCOPY (EGD) WITH PROPOFOL N/A 06/19/2020   Procedure: ESOPHAGOGASTRODUODENOSCOPY (EGD) WITH PROPOFOL;  Surgeon: Wyline Mood, MD;  Location: [facility] ENDOSCOPY;  Service: Gastroenterology;  Laterality: N/A;   groin surgery     as a child   PORTA CATH INSERTION N/A 06/28/2020   Procedure: PORTA CATH INSERTION;  Surgeon: Annice Needy, MD;  Location: [facility] INVASIVE CV LAB;  Service: Cardiovascular;  Laterality: N/A;   SHOULDER SURGERY      Social History   Socioeconomic History   Marital status: Married    Spouse name: Not on file   Number of children: 2   Years of education: Not on file   Highest education level: Not on file  Occupational History   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  Tobacco Use   Smoking status: Former    Types: Cigarettes    Quit date: 07/08/1988    Years since quitting: 32.8   Smokeless tobacco: Never  Vaping Use   Vaping Use: Never used  Substance and Sexual Activity   Alcohol use: No    Comment: recovering alcoholic for 13 years   Drug use: No   Sexual activity: Not Currently  Other Topics Concern   Not on file  Social History Narrative   Has living will--needs to get notarized   Daughter should be health care POA (wife with dementia)   Would accept resuscitation   Would accept tube feeds depending on the circumstance   Social Determinants of Health   Financial Resource Strain: Not on file  Food Insecurity: Not on file  Transportation Needs: Not on file  Physical Activity: Not on file  Stress: Not on file  Social Connections: Not on file  Intimate Partner Violence: Not on file    Family History  Problem Relation Age of Onset   Stomach cancer Mother    Diabetes Brother     Hypertension Brother    Hypertension Father    Prostate cancer Father    Colon cancer Sister    Cancer Other        either cervical or ovarian, d. 58s   Brain cancer Sister      Current Outpatient Medications:    dexamethasone (DECADRON) 4 MG tablet, Take 2 tablets (8 mg total) by mouth daily. Start the day after chemotherapy for 2 days. Take with food., Disp: 30 tablet, Rfl: 1   Oxycodone HCl 10 MG TABS, Take 1 tablet (10 mg total) by mouth every 4 (four) hours as needed., Disp: 120 tablet, Rfl: 0   pantoprazole (PROTONIX) 40 MG tablet, TAKE 1 TABLET BY MOUTH TWICE A DAY BEFORE MEALS, Disp: 60 tablet, Rfl: 11   acetaminophen (TYLENOL) 325 MG tablet, Take 650 mg by mouth every 6 (six) hours as needed. (Patient not taking: Reported on 05/23/2021), Disp: , Rfl:    celecoxib (CELEBREX) 200 MG capsule, Take 200 mg by mouth 2 (two)  times daily as needed. (Patient not taking: Reported on 05/23/2021), Disp: , Rfl:    lidocaine-prilocaine (EMLA) cream, Apply 1 application topically as needed. Apply small amount to port site at least 1 hour prior to it being accessed, cover with plastic wrap (Patient not taking: No sig reported), Disp: 30 g, Rfl: 3   LORazepam (ATIVAN) 0.5 MG tablet, Take 1 tablet (0.5 mg total) by mouth every 6 (six) hours as needed (Nausea or vomiting). (Patient not taking: No sig reported), Disp: 30 tablet, Rfl: 0   ondansetron (ZOFRAN) 8 MG tablet, Take 1 tablet (8 mg total) by mouth 2 (two) times daily as needed for refractory nausea / vomiting. Start on day 3 after chemotherapy. (Patient not taking: No sig reported), Disp: 30 tablet, Rfl: 1   prochlorperazine (COMPAZINE) 10 MG tablet, Take 1 tablet (10 mg total) by mouth every 6 (six) hours as needed (Nausea or vomiting). (Patient not taking: No sig reported), Disp: 30 tablet, Rfl: 1   triamcinolone cream (KENALOG) 0.1 %, Apply 1 application topically 3 (three) times daily as needed. (Patient not taking: Reported on 05/23/2021),  Disp: 300 each, Rfl: 1 No current facility-administered medications for this visit.  Facility-Administered Medications Ordered in Other Visits:    sodium chloride flush (NS) 0.9 % injection 10 mL, 10 mL, Intracatheter, PRN, Creig Hines, MD, 10 mL at 01/04/21 1149  MR Brain W Wo Contrast  Result Date: 05/17/2021 CLINICAL DATA:  Right eye mass found one week ago. Esophageal adenocarcinoma. EXAM: MRI HEAD WITHOUT AND WITH CONTRAST TECHNIQUE: Multiplanar, multiecho pulse sequences of the brain and surrounding structures were obtained without and with intravenous contrast. CONTRAST:  9mL GADAVIST GADOBUTROL 1 MMOL/ML IV SOLN COMPARISON:  None. FINDINGS: Brain: Dural-based enhancing mass is present along the inferior falx and tentorium both above. The mass involves both supratentorial and infratentorial compartments. Measures approximately 2 x 2.5 x 2.4 cm. Abuts and invades the straight sinus abuts the torcula with invasion of the proximal right transverse sinus. Likely reactive pachymeningeal thickening and enhancement. Mild edema is present in the left occipital lobe. There is no acute infarction. No hydrocephalus. Minimal patchy T2 hyperintensity in the supratentorial white matter is nonspecific but may reflect minor chronic microvascular ischemic changes. Vascular: As above. Major vessel flow voids at the skull base are preserved. Skull and upper cervical spine: Normal marrow signal is preserved. Sinuses/Orbits: Paranasal sinuses are aerated. A dedicated orbit study was not ordered. There is abnormal T1 hyperintensity at the superior aspect of the right globe. Unclear if there is enhancement. Measures approximately 1 cm. Other: Sella is unremarkable.  Mastoid air cells are clear. IMPRESSION: Supratentorial/infratentorial dural-based mass along the inferior falx and tentorium. Associated dural thickening and enhancement and mild left occipital lobe edema. Abuts and invades the straight sinus and proximal  right transverse sinus. Dural-based metastasis and meningioma are the primary considerations (metastasis more likely as no definite lesion on December 2021 PET-CT). Approximately 1 cm T1 hyperintense lesion at the superior aspect of the right globe. May reflect proteinaceous material/blood products or a mass. Electronically Signed   By: Guadlupe Spanish M.D.   On: 05/17/2021 11:24   NM PET Image Restag (PS) Skull Base To Thigh  Result Date: 05/22/2021 CLINICAL DATA:  Subsequent treatment strategy for esophageal cancer. Recent RIGHT proximal humeral fracture. EXAM: NUCLEAR MEDICINE PET SKULL BASE TO THIGH TECHNIQUE: 10.6 mCi F-18 FDG was injected intravenously. Full-ring PET imaging was performed from the skull base to thigh after the radiotracer. CT data  was obtained and used for attenuation correction and anatomic localization. Fasting blood glucose: 99 mg/dl COMPARISON:  PET CT 16/04/9603 FINDINGS: Mediastinal blood pool activity: SUV max 1.9 Liver activity: SUV max NA NECK: No hypermetabolic lymph nodes in the neck. Incidental CT findings: none CHEST: Interval resolution of the metabolic activity of the GE junction. No hypermetabolic tissue remains. No hypermetabolic or enlarged mediastinal lymph nodes. Incidental CT findings: Rounded pulmonary nodule in the medial RIGHT lower lobe along the pleural surface adjacent to the spine measures 10 mm on image 133/3. This nodule is new from comparison exam but does not have metabolic activity. ABDOMEN/PELVIS: Interval resolution of the multifocal hypermetabolic lesions within the liver. No discrete hypermetabolic lesions above background above liver activity. Resolution of the previous seen hypermetabolic gastrohepatic ligament lymph nodes. Incidental CT findings: Liver has a nodular contour. Atherosclerotic calcification of the aorta. SKELETON: There is uniform increase in marrow activity throughout the pelvis, spine ribs and sternum. Findings most suggestive of a  GCSF type response. Intense metabolic activity associated with fracture of the proximal RIGHT humerus. Early callus formation. Incidental CT findings: none IMPRESSION: 1. Resolution of activity through the esophagus. 2. No evidence of metastatic mediastinal lymph nodes. 3. New medial RIGHT lower lobe pulmonary nodule without metabolic activity. Recommend attention on follow-up. 4. Resolution of multifocal hypermetabolic hepatic metastasis. 5. Resolution of hypermetabolic gastrohepatic ligament lymph nodes. 6. No evidence of active esophageal carcinoma. 7. Intense marrow activity typical of GCSF type response. 8. Intense metabolic activity associated with subacute RIGHT humerus fracture. Electronically Signed   By: Genevive Bi M.D.   On: 05/22/2021 16:09   ECHOCARDIOGRAM COMPLETE  Result Date: 05/21/2021    ECHOCARDIOGRAM REPORT   Patient Name:   KIPTYN LAMIA Date of Exam: 05/21/2021 Medical Rec #:  [id number]      Height:       71.0 in Accession #:    [id number]     Weight:       199.7 lb Date of Birth:  [birth year]     BSA:          2.107 m Patient Age:    65 years       BP:           157/75 mmHg Patient Gender: M              HR:           64 bpm. Exam Location:  [facility] Procedure: 2D Echo, Cardiac Doppler, Color Doppler and Strain Analysis Indications:     Esophageal adenocarcinoma  History:         Patient has prior history of Echocardiogram examinations, most                  recent 02/15/2021. Risk Factors:Hypertension.  Sonographer:     Cristela Blue Referring Phys:  [id number] [facility] C Marcio Hoque Diagnosing Phys: Lorine Bears MD  Sonographer Comments: Global longitudinal strain was attempted. IMPRESSIONS  1. Left ventricular ejection fraction, by estimation, is 50 to 55%. The left ventricle has low normal function. The left ventricle has no regional wall motion abnormalities. The left ventricular internal cavity size was mildly dilated. Left ventricular diastolic parameters are indeterminate.  2. Right  ventricular systolic function is normal. The right ventricular size is normal. Tricuspid regurgitation signal is inadequate for assessing PA pressure.  3. Left atrial size was mildly dilated.  4. The mitral valve is normal in structure. Mild mitral valve regurgitation. No evidence of mitral stenosis.  5. The aortic valve is normal in structure. Aortic valve regurgitation is not visualized. No aortic stenosis is present.  6. The inferior vena cava is normal in size with greater than 50% respiratory variability, suggesting right atrial pressure of 3 mmHg. Comparison(s): A prior study was performed on 02/2021. Changes from prior study are noted. EF slightly decreased. FINDINGS  Left Ventricle: Left ventricular ejection fraction, by estimation, is 50 to 55%. The left ventricle has low normal function. The left ventricle has no regional wall motion abnormalities. Global longitudinal strain performed but not reported based on interpreter judgement due to suboptimal tracking. The left ventricular internal cavity size was mildly dilated. There is no left ventricular hypertrophy. Left ventricular diastolic parameters are indeterminate. Right Ventricle: The right ventricular size is normal. No increase in right ventricular wall thickness. Right ventricular systolic function is normal. Tricuspid regurgitation signal is inadequate for assessing PA pressure. Left Atrium: Left atrial size was mildly dilated. Right Atrium: Right atrial size was normal in size. Pericardium: There is no evidence of pericardial effusion. Mitral Valve: The mitral valve is normal in structure. Mild mitral valve regurgitation. No evidence of mitral valve stenosis. MV peak gradient, 4.0 mmHg. The mean mitral valve gradient is 2.0 mmHg. Tricuspid Valve: The tricuspid valve is normal in structure. Tricuspid valve regurgitation is trivial. No evidence of tricuspid stenosis. Aortic Valve: The aortic valve is normal in structure. Aortic valve regurgitation is  not visualized. No aortic stenosis is present. Aortic valve mean gradient measures 3.0 mmHg. Aortic valve peak gradient measures 6.0 mmHg. Aortic valve area, by VTI measures 1.95 cm. Pulmonic Valve: The pulmonic valve was normal in structure. Pulmonic valve regurgitation is not visualized. No evidence of pulmonic stenosis. Aorta: The aortic root is normal in size and structure. Venous: The inferior vena cava is normal in size with greater than 50% respiratory variability, suggesting right atrial pressure of 3 mmHg. IAS/Shunts: No atrial level shunt detected by color flow Doppler.  LEFT VENTRICLE PLAX 2D LVIDd:         5.60 cm      Diastology LVIDs:         4.15 cm      LV e' medial:    6.42 cm/s LV PW:         1.20 cm      LV E/e' medial:  15.7 LV IVS:        0.90 cm      LV e' lateral:   7.83 cm/s LVOT diam:     2.10 cm      LV E/e' lateral: 12.9 LV SV:         45 LV SV Index:   21 LVOT Area:     3.46 cm                              3D Volume EF: LV Volumes (MOD)            3D EF:        45 % LV vol d, MOD A2C: 138.0 ml LV EDV:       200 ml LV vol d, MOD A4C: 158.0 ml LV ESV:       110 ml LV vol s, MOD A2C: 72.3 ml  LV SV:        90 ml LV vol s, MOD A4C: 86.7 ml LV SV MOD A2C:     65.7 ml LV SV MOD A4C:  158.0 ml LV SV MOD BP:      66.9 ml RIGHT VENTRICLE RV Basal diam:  4.30 cm RV S prime:     14.70 cm/s TAPSE (M-mode): 3.9 cm LEFT ATRIUM           Index        RIGHT ATRIUM           Index LA diam:      3.20 cm 1.52 cm/m   RA Area:     23.10 cm LA Vol (A2C): 73.0 ml 34.64 ml/m  RA Volume:   67.30 ml  31.94 ml/m LA Vol (A4C): 22.8 ml 10.82 ml/m  AORTIC VALVE                    PULMONIC VALVE AV Area (Vmax):    1.82 cm     PV Vmax:        1.08 m/s AV Area (Vmean):   1.80 cm     PV Vmean:       79.650 cm/s AV Area (VTI):     1.95 cm     PV VTI:         0.234 m AV Vmax:           122.00 cm/s  PV Peak grad:   4.7 mmHg AV Vmean:          83.000 cm/s  PV Mean grad:   3.0 mmHg AV VTI:            0.231 m       RVOT Peak grad: 9 mmHg AV Peak Grad:      6.0 mmHg AV Mean Grad:      3.0 mmHg LVOT Vmax:         64.00 cm/s LVOT Vmean:        43.200 cm/s LVOT VTI:          0.130 m LVOT/AV VTI ratio: 0.56  AORTA Ao Root diam: 3.55 cm MITRAL VALVE                TRICUSPID VALVE MV Area (PHT): 3.24 cm     TR Peak grad:   25.8 mmHg MV Area VTI:   1.40 cm     TR Vmax:        254.00 cm/s MV Peak grad:  4.0 mmHg MV Mean grad:  2.0 mmHg     SHUNTS MV Vmax:       1.00 m/s     Systemic VTI:  0.13 m MV Vmean:      66.5 cm/s    Systemic Diam: 2.10 cm MV Decel Time: 234 msec     Pulmonic VTI:  0.265 m MV E velocity: 101.00 cm/s MV A velocity: 72.40 cm/s MV E/A ratio:  1.40 Lorine Bears MD Electronically signed by Lorine Bears MD Signature Date/Time: 05/21/2021/11:09:37 AM    Final     No images are attached to the encounter.   CMP Latest Ref Rng & Units 05/15/2021  Glucose 70 - 99 mg/dL 952(W)  BUN 8 - 23 mg/dL 11  Creatinine 4.13 - 2.44 mg/dL 0.10  Sodium 272 - 536 mmol/L 137  Potassium 3.5 - 5.1 mmol/L 3.6  Chloride 98 - 111 mmol/L 107  CO2 22 - 32 mmol/L 24  Calcium 8.9 - 10.3 mg/dL 6.4(Q)  Total Protein 6.5 - 8.1 g/dL 6.4(L)  Total Bilirubin 0.3 - 1.2 mg/dL 0.8  Alkaline Phos 38 - 126 U/L 192(H)  AST 15 -  41 U/L 30  ALT 0 - 44 U/L 24   CBC Latest Ref Rng & Units 05/15/2021  WBC 4.0 - 10.5 K/uL 8.2  Hemoglobin 13.0 - 17.0 g/dL 11.9(L)  Hematocrit 39.0 - 52.0 % 36.1(L)  Platelets 150 - 400 K/uL 113(L)     Assessment and plan: Patient is a 65 year old male with history of metastatic esophageal adenocarcinoma with liver metastases and this is a visit to discuss PET scan and MRI results and further management  I have discussed patient's MRI with Dr. Adriana Simas from neurosurgery as well as Dr. Aggie Cosier.  MRI brain shows dural based mass 2.1 cm in sizeWhich could be meningioma versus esophageal metastases.  Although the appearance Seems to be moreConsistent with meningioma.  I am therefore inclined to hold off on any  empiric radiation at this time and get a repeat MRI in 2 months to see if the lesion has changed any.  Patient CEA went up exponentially from 11 to 111 but surprisingly PET CT scan shows interval resolution of hypermetabolismWithin the lymph nodes.And gastricliver as well asGE junction.  No other evidence of hypermetabolic adenopathy.  He was noted to have a 10 mm right lower lobe lung nodule on his recent CT scan but it is not hypermetabolic on PET scan and therefore it is unclear if it is malignancy versus reactive.  Given the dramatic positive response seen on PET CT scan I am inclined to monitor his esophageal cancer with ongoing treatment without changing to second line treatment.  Patient's recent echocardiogram did show a small dip in his ejection fraction from 55 to 60% to 50 to 55% and was reported to be low normal.  We will therefore hold off on giving him trastuzumab for the next 2 cycles.  I will tentatively see him back the week after Thanksgiving to resume chemotherapy and he will be receiving only 5-FU chemotherapy at that time.  He is receiving Keytruda every 6 weeks  Follow-up instructions: See me in 2 weeks to resume chemotherapy  I discussed the assessment and treatment plan with the patient. The patient was provided an opportunity to ask questions and all were answered. The patient agreed with the plan and demonstrated an understanding of the instructions.   The patient was advised to call back or seek an in-person evaluation if the symptoms worsen or if the condition fails to improve as anticipated.  I provided 22 minutes of non face-to-face telephone visit time during this encounter, and > 50% was spent counseling as documented under my assessment & plan.time spent in reviewing mri and pet images and coordinating patients care  Visit Diagnosis: 1. Esophageal adenocarcinoma (HCC)     Dr. Owens Shark, MD, MPH [facility] at [facility] Tel-  [phone] 05/23/2021 4:19 PM

## 2021-05-24 ENCOUNTER — Telehealth: Payer: Medicare Other | Admitting: Oncology

## 2021-05-25 ENCOUNTER — Inpatient Hospital Stay: Payer: Medicare Other | Admitting: Oncology

## 2021-05-29 NOTE — Progress Notes (Signed)
Spoilage occurred on 05/17/2021,Medication dispensed: Udenyca, per Suszanne Conners, RN advised pharmacy of waste/spoilage of medication due to Syringe not working properly . [employer] manufacture was advised of (spoilage/waste/defective) Spoilage and is processing replacement of medication waste. Finance team advised of replacement of waste.  Case #:[id number] Lot:  [id number] Exp: 06/2022  Madalyn Rob, CPhT IV Drug Replacement Specialist  [place] Phone: [phone]

## 2021-06-05 ENCOUNTER — Inpatient Hospital Stay (HOSPITAL_BASED_OUTPATIENT_CLINIC_OR_DEPARTMENT_OTHER): Payer: Medicare Other | Admitting: Oncology

## 2021-06-05 ENCOUNTER — Inpatient Hospital Stay: Payer: Medicare Other

## 2021-06-05 ENCOUNTER — Other Ambulatory Visit: Payer: Self-pay

## 2021-06-05 VITALS — BP 153/87 | HR 80 | Temp 98.9°F | Resp 18 | Wt 191.8 lb

## 2021-06-05 DIAGNOSIS — C159 Malignant neoplasm of esophagus, unspecified: Secondary | ICD-10-CM

## 2021-06-05 DIAGNOSIS — Z5112 Encounter for antineoplastic immunotherapy: Secondary | ICD-10-CM

## 2021-06-05 DIAGNOSIS — Z95828 Presence of other vascular implants and grafts: Secondary | ICD-10-CM

## 2021-06-05 DIAGNOSIS — Z5111 Encounter for antineoplastic chemotherapy: Secondary | ICD-10-CM

## 2021-06-05 LAB — CBC WITH DIFFERENTIAL/PLATELET
Abs Immature Granulocytes: 0.07 10*3/uL (ref 0.00–0.07)
Basophils Absolute: 0.1 10*3/uL (ref 0.0–0.1)
Basophils Relative: 1 %
Eosinophils Absolute: 0.1 10*3/uL (ref 0.0–0.5)
Eosinophils Relative: 1 %
HCT: 42 % (ref 39.0–52.0)
Hemoglobin: 13.6 g/dL (ref 13.0–17.0)
Immature Granulocytes: 1 %
Lymphocytes Relative: 9 %
Lymphs Abs: 0.9 10*3/uL (ref 0.7–4.0)
MCH: 29.4 pg (ref 26.0–34.0)
MCHC: 32.4 g/dL (ref 30.0–36.0)
MCV: 90.7 fL (ref 80.0–100.0)
Monocytes Absolute: 0.9 10*3/uL (ref 0.1–1.0)
Monocytes Relative: 9 %
Neutro Abs: 8.1 10*3/uL — ABNORMAL HIGH (ref 1.7–7.7)
Neutrophils Relative %: 79 %
Platelets: 217 10*3/uL (ref 150–400)
RBC: 4.63 MIL/uL (ref 4.22–5.81)
RDW: 15.9 % — ABNORMAL HIGH (ref 11.5–15.5)
WBC: 10.2 10*3/uL (ref 4.0–10.5)
nRBC: 0 % (ref 0.0–0.2)

## 2021-06-05 LAB — COMPREHENSIVE METABOLIC PANEL
ALT: 24 U/L (ref 0–44)
AST: 33 U/L (ref 15–41)
Albumin: 4.1 g/dL (ref 3.5–5.0)
Alkaline Phosphatase: 203 U/L — ABNORMAL HIGH (ref 38–126)
Anion gap: 11 (ref 5–15)
BUN: 11 mg/dL (ref 8–23)
CO2: 22 mmol/L (ref 22–32)
Calcium: 9.3 mg/dL (ref 8.9–10.3)
Chloride: 104 mmol/L (ref 98–111)
Creatinine, Ser: 0.97 mg/dL (ref 0.61–1.24)
GFR, Estimated: >60 mL/min (ref >60)
Glucose, Bld: 144 mg/dL — ABNORMAL HIGH (ref 70–99)
Potassium: 3.7 mmol/L (ref 3.5–5.1)
Sodium: 137 mmol/L (ref 135–145)
Total Bilirubin: 0.6 mg/dL (ref 0.3–1.2)
Total Protein: 7.1 g/dL (ref 6.5–8.1)

## 2021-06-05 MED ORDER — DEXTROSE 5 % IV SOLN
Freq: Once | INTRAVENOUS | Status: DC
  Filled 2021-06-05: qty 250

## 2021-06-05 MED ORDER — SODIUM CHLORIDE 0.9% FLUSH
10.0000 mL | Freq: Once | INTRAVENOUS | Status: AC
  Administered 2021-06-05: 10 mL via INTRAVENOUS
  Filled 2021-06-05: qty 10

## 2021-06-05 MED ORDER — SODIUM CHLORIDE 0.9 % IV SOLN
10.0000 mg | Freq: Once | INTRAVENOUS | Status: AC
  Administered 2021-06-05: 10 mg via INTRAVENOUS
  Filled 2021-06-05: qty 10

## 2021-06-05 MED ORDER — FLUOROURACIL CHEMO INJECTION 2.5 GM/50ML
400.0000 mg/m2 | Freq: Once | INTRAVENOUS | Status: AC
  Administered 2021-06-05: 850 mg via INTRAVENOUS
  Filled 2021-06-05: qty 17

## 2021-06-05 MED ORDER — SODIUM CHLORIDE 0.9 % IV SOLN
INTRAVENOUS | Status: DC | PRN
  Filled 2021-06-05: qty 250

## 2021-06-05 MED ORDER — HEPARIN SOD (PORK) LOCK FLUSH 100 UNIT/ML IV SOLN
500.0000 [IU] | Freq: Once | INTRAVENOUS | Status: DC
  Filled 2021-06-05: qty 5

## 2021-06-05 MED ORDER — TRASTUZUMAB-ANNS CHEMO 150 MG IV SOLR
4.0000 mg/kg | Freq: Once | INTRAVENOUS | Status: AC
  Administered 2021-06-05: 357 mg via INTRAVENOUS
  Filled 2021-06-05: qty 17

## 2021-06-05 MED ORDER — PALONOSETRON HCL INJECTION 0.25 MG/5ML: 0.2500 mg | Freq: Once | INTRAVENOUS | Status: DC

## 2021-06-05 MED ORDER — DIPHENHYDRAMINE HCL 50 MG/ML IJ SOLN
50.0000 mg | Freq: Once | INTRAMUSCULAR | Status: AC
  Administered 2021-06-05: 50 mg via INTRAVENOUS

## 2021-06-05 MED ORDER — LEUCOVORIN CALCIUM INJECTION 350 MG
850.0000 mg | Freq: Once | INTRAVENOUS | Status: AC
  Administered 2021-06-05: 850 mg via INTRAVENOUS
  Filled 2021-06-05: qty 42.5

## 2021-06-05 MED ORDER — ACETAMINOPHEN 325 MG PO TABS
650.0000 mg | ORAL_TABLET | Freq: Once | ORAL | Status: AC
  Administered 2021-06-05: 650 mg via ORAL

## 2021-06-05 MED ORDER — SODIUM CHLORIDE 0.9 % IV SOLN
5000.0000 mg | INTRAVENOUS | Status: DC
  Administered 2021-06-05: 5000 mg via INTRAVENOUS
  Filled 2021-06-05: qty 100

## 2021-06-05 NOTE — Progress Notes (Signed)
CT findings: Rounded pulmonary nodule in the medial RIGHT lower lobe along the pleural surface adjacent to the spine measures 10 mm on image 133/3. This nodule is new from comparison exam but does not have metabolic activity. ABDOMEN/PELVIS: Interval resolution of the multifocal hypermetabolic lesions within the liver. No discrete hypermetabolic lesions above background above liver activity. Resolution of the previous seen hypermetabolic gastrohepatic ligament lymph nodes. Incidental CT findings: Liver has a nodular contour. Atherosclerotic calcification of the aorta. SKELETON: There is uniform increase in marrow activity throughout the pelvis, spine ribs and sternum. Findings most suggestive of a GCSF type response. Intense metabolic activity associated with fracture of the proximal RIGHT humerus. Early callus formation. Incidental CT findings: none IMPRESSION: 1. Resolution of activity through the esophagus. 2. No evidence of metastatic mediastinal lymph nodes. 3. New medial RIGHT lower lobe pulmonary nodule without metabolic activity. Recommend attention on follow-up. 4. Resolution of multifocal hypermetabolic hepatic metastasis. 5. Resolution of hypermetabolic gastrohepatic ligament lymph nodes. 6. No evidence of active esophageal carcinoma. 7. Intense marrow activity typical of GCSF type response. 8. Intense metabolic activity associated with subacute RIGHT humerus fracture. Electronically Signed: By: Cory [name] M.D. On: 05/22/2021 16:09   ECHOCARDIOGRAM COMPLETE  Result Date: 05/21/2021    ECHOCARDIOGRAM REPORT   Patient Name:   Cory [name] Date of Exam: 05/21/2021 Medical Rec #:  [id number]      Height:       71.0 in Accession #:    [id number]     Weight:       199.7 lb Date of Birth:  [birth year]     BSA:          2.107 m Patient Age:    65 years       BP:           157/75 mmHg Patient Gender: M              HR:           64 bpm.  Exam Location:  [facility] Procedure: 2D Echo, Cardiac Doppler, Color Doppler and Strain Analysis Indications:     Esophageal adenocarcinoma  History:         Patient has prior history of Echocardiogram examinations, most                  recent 02/15/2021. Risk Factors:Hypertension.  Sonographer:     Cory [name] Referring Phys:  [id number] Cory [name] Diagnosing Phys: Cory Sacramento MD  Sonographer Comments: Global longitudinal strain was attempted. IMPRESSIONS  1. Left ventricular ejection fraction, by estimation, is 50 to 55%. The left ventricle has low normal function. The left ventricle has no regional wall motion abnormalities. The left ventricular internal cavity size was mildly dilated. Left ventricular diastolic parameters are indeterminate.  2. Right ventricular systolic function is normal. The right ventricular size is normal. Tricuspid regurgitation signal is inadequate for assessing PA pressure.  3. Left atrial size was mildly dilated.  4. The mitral valve is normal in structure. Mild mitral valve regurgitation. No evidence of mitral stenosis.  5. The aortic valve is normal in structure. Aortic valve regurgitation is not visualized. No aortic stenosis is present.  6. The inferior vena cava is normal in size with greater than 50% respiratory variability, suggesting right atrial pressure of 3 mmHg. Comparison(s): A prior study was performed on 02/2021. Changes from prior study are noted. EF slightly decreased. FINDINGS  Left Ventricle: Left ventricular ejection fraction, by estimation, is 50 to 55%.  Hematology/Oncology Consult note [facility]  [phone] [phone]  Patient Care Team: Cory Carbon, MD as PCP - General Cory Jacks, RN as Oncology Nurse Navigator Cory Guadeloupe, MD as Consulting Physician (Hematology and Oncology)   Name of the patient: Cory [name]  [id number]  1956-06-14   Date of visit: 06/05/21  Diagnosis-  metastatic esophageal cancer with liver metastases  Chief complaint/ Reason for visit-on treatment assessment prior to cycle 24 of 5-FU trastuzumab chemotherapy  Heme/Onc history: Patient is a 65 year old male who presented with symptoms of indigestion and heartburn which gradually progressed to dysphagia.  He underwent EGD on 06/20/2020 By Dr. Vicente [name] which showed a large nearly obstructing mass in the lower third of the esophagus 35 cm from incisors.  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  PD-l1 <1 %, TMB HIGH    Presently patient reports dysphagia but is able to eat cereal or soft food.  He has lost about 13 pounds in the last month itself.  Denies any significant pain.  He recently retired after many years of service and is otherwise independent of his ADLs and IADLs.     PET CT scan on 06/26/2020 showed circumferential distal esophageal mass with an SUV of 13.3 extending over 5.5 cm. Right lower paratracheal node is 0.6 cm with an SUV of 3.1. Hypermetabolic right gastric lymph nodes including 1.1 cm node with an SUV of 5.8. Numerous hypermetabolic lesions throughout the liver somewhat confluent around the periphery which were hypermetabolic between 8 and 9. Faintly hypermetabolic left adrenal gland   NGS testing showed high tumor mutational burden CPS score less than 1. CD9-NRG1 fusion. BRAF 581L, CCN E1 gain.  EMLA for fusion of BX [id number], FGF 23 gain, FGF 6 gain, MET R988C, T p53 S1 41F. ERBB2 gain but no other actionable mutations   Patient currently on  first-line FOLFOX/trastuzumab/Keytruda.  He gets Keytruda every 6 weeks    Interval history-patient denies any headaches, visual issues or focal weakness or numbness.  His main concern is his ongoing right shoulder pain which sometimes makes it difficult for him to get a good night sleep.  He is still using his oxycodone sparingly.  ECOG PS- 1 Pain scale- 4 Opioid associated constipation- no  Review of systems- Review of Systems  Constitutional:  Positive for malaise/fatigue. Negative for chills, fever and weight loss.  HENT:  Negative for congestion, ear discharge and nosebleeds.   Eyes:  Negative for blurred vision.  Respiratory:  Negative for cough, hemoptysis, sputum production, shortness of breath and wheezing.   Cardiovascular:  Negative for chest pain, palpitations, orthopnea and claudication.  Gastrointestinal:  Negative for abdominal pain, blood in stool, constipation, diarrhea, heartburn, melena, nausea and vomiting.  Genitourinary:  Negative for dysuria, flank pain, frequency, hematuria and urgency.  Musculoskeletal:  Negative for back pain, joint pain and myalgias.       Right shoulder pain  Skin:  Negative for rash.  Neurological:  Negative for dizziness, tingling, focal weakness, seizures, weakness and headaches.  Endo/Heme/Allergies:  Does not bruise/bleed easily.  Psychiatric/Behavioral:  Negative for depression and suicidal ideas. The patient does not have insomnia.      No Known Allergies   Past Medical History:  Diagnosis Date   Esophageal adenocarcinoma ([place]) 06/23/2020   Family history of brain cancer    Family history of colon cancer    Family history of melanoma    Family history of  CT findings: Rounded pulmonary nodule in the medial RIGHT lower lobe along the pleural surface adjacent to the spine measures 10 mm on image 133/3. This nodule is new from comparison exam but does not have metabolic activity. ABDOMEN/PELVIS: Interval resolution of the multifocal hypermetabolic lesions within the liver. No discrete hypermetabolic lesions above background above liver activity. Resolution of the previous seen hypermetabolic gastrohepatic ligament lymph nodes. Incidental CT findings: Liver has a nodular contour. Atherosclerotic calcification of the aorta. SKELETON: There is uniform increase in marrow activity throughout the pelvis, spine ribs and sternum. Findings most suggestive of a GCSF type response. Intense metabolic activity associated with fracture of the proximal RIGHT humerus. Early callus formation. Incidental CT findings: none IMPRESSION: 1. Resolution of activity through the esophagus. 2. No evidence of metastatic mediastinal lymph nodes. 3. New medial RIGHT lower lobe pulmonary nodule without metabolic activity. Recommend attention on follow-up. 4. Resolution of multifocal hypermetabolic hepatic metastasis. 5. Resolution of hypermetabolic gastrohepatic ligament lymph nodes. 6. No evidence of active esophageal carcinoma. 7. Intense marrow activity typical of GCSF type response. 8. Intense metabolic activity associated with subacute RIGHT humerus fracture. Electronically Signed: By: Cory [name] M.D. On: 05/22/2021 16:09   ECHOCARDIOGRAM COMPLETE  Result Date: 05/21/2021    ECHOCARDIOGRAM REPORT   Patient Name:   Cory [name] Date of Exam: 05/21/2021 Medical Rec #:  [id number]      Height:       71.0 in Accession #:    [id number]     Weight:       199.7 lb Date of Birth:  [birth year]     BSA:          2.107 m Patient Age:    65 years       BP:           157/75 mmHg Patient Gender: M              HR:           64 bpm.  Exam Location:  [facility] Procedure: 2D Echo, Cardiac Doppler, Color Doppler and Strain Analysis Indications:     Esophageal adenocarcinoma  History:         Patient has prior history of Echocardiogram examinations, most                  recent 02/15/2021. Risk Factors:Hypertension.  Sonographer:     Cory [name] Referring Phys:  [id number] Cory [name] Diagnosing Phys: Cory Sacramento MD  Sonographer Comments: Global longitudinal strain was attempted. IMPRESSIONS  1. Left ventricular ejection fraction, by estimation, is 50 to 55%. The left ventricle has low normal function. The left ventricle has no regional wall motion abnormalities. The left ventricular internal cavity size was mildly dilated. Left ventricular diastolic parameters are indeterminate.  2. Right ventricular systolic function is normal. The right ventricular size is normal. Tricuspid regurgitation signal is inadequate for assessing PA pressure.  3. Left atrial size was mildly dilated.  4. The mitral valve is normal in structure. Mild mitral valve regurgitation. No evidence of mitral stenosis.  5. The aortic valve is normal in structure. Aortic valve regurgitation is not visualized. No aortic stenosis is present.  6. The inferior vena cava is normal in size with greater than 50% respiratory variability, suggesting right atrial pressure of 3 mmHg. Comparison(s): A prior study was performed on 02/2021. Changes from prior study are noted. EF slightly decreased. FINDINGS  Left Ventricle: Left ventricular ejection fraction, by estimation, is 50 to 55%.  Hematology/Oncology Consult note [facility]  [phone] [phone]  Patient Care Team: Cory Carbon, MD as PCP - General Cory Jacks, RN as Oncology Nurse Navigator Cory Guadeloupe, MD as Consulting Physician (Hematology and Oncology)   Name of the patient: Cory [name]  [id number]  1956-06-14   Date of visit: 06/05/21  Diagnosis-  metastatic esophageal cancer with liver metastases  Chief complaint/ Reason for visit-on treatment assessment prior to cycle 24 of 5-FU trastuzumab chemotherapy  Heme/Onc history: Patient is a 65 year old male who presented with symptoms of indigestion and heartburn which gradually progressed to dysphagia.  He underwent EGD on 06/20/2020 By Dr. Vicente [name] which showed a large nearly obstructing mass in the lower third of the esophagus 35 cm from incisors.  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  PD-l1 <1 %, TMB HIGH    Presently patient reports dysphagia but is able to eat cereal or soft food.  He has lost about 13 pounds in the last month itself.  Denies any significant pain.  He recently retired after many years of service and is otherwise independent of his ADLs and IADLs.     PET CT scan on 06/26/2020 showed circumferential distal esophageal mass with an SUV of 13.3 extending over 5.5 cm. Right lower paratracheal node is 0.6 cm with an SUV of 3.1. Hypermetabolic right gastric lymph nodes including 1.1 cm node with an SUV of 5.8. Numerous hypermetabolic lesions throughout the liver somewhat confluent around the periphery which were hypermetabolic between 8 and 9. Faintly hypermetabolic left adrenal gland   NGS testing showed high tumor mutational burden CPS score less than 1. CD9-NRG1 fusion. BRAF 581L, CCN E1 gain.  EMLA for fusion of BX [id number], FGF 23 gain, FGF 6 gain, MET R988C, T p53 S1 41F. ERBB2 gain but no other actionable mutations   Patient currently on  first-line FOLFOX/trastuzumab/Keytruda.  He gets Keytruda every 6 weeks    Interval history-patient denies any headaches, visual issues or focal weakness or numbness.  His main concern is his ongoing right shoulder pain which sometimes makes it difficult for him to get a good night sleep.  He is still using his oxycodone sparingly.  ECOG PS- 1 Pain scale- 4 Opioid associated constipation- no  Review of systems- Review of Systems  Constitutional:  Positive for malaise/fatigue. Negative for chills, fever and weight loss.  HENT:  Negative for congestion, ear discharge and nosebleeds.   Eyes:  Negative for blurred vision.  Respiratory:  Negative for cough, hemoptysis, sputum production, shortness of breath and wheezing.   Cardiovascular:  Negative for chest pain, palpitations, orthopnea and claudication.  Gastrointestinal:  Negative for abdominal pain, blood in stool, constipation, diarrhea, heartburn, melena, nausea and vomiting.  Genitourinary:  Negative for dysuria, flank pain, frequency, hematuria and urgency.  Musculoskeletal:  Negative for back pain, joint pain and myalgias.       Right shoulder pain  Skin:  Negative for rash.  Neurological:  Negative for dizziness, tingling, focal weakness, seizures, weakness and headaches.  Endo/Heme/Allergies:  Does not bruise/bleed easily.  Psychiatric/Behavioral:  Negative for depression and suicidal ideas. The patient does not have insomnia.      No Known Allergies   Past Medical History:  Diagnosis Date   Esophageal adenocarcinoma ([place]) 06/23/2020   Family history of brain cancer    Family history of colon cancer    Family history of melanoma    Family history of  Hematology/Oncology Consult note [facility]  [phone] [phone]  Patient Care Team: Cory Carbon, MD as PCP - General Cory Jacks, RN as Oncology Nurse Navigator Cory Guadeloupe, MD as Consulting Physician (Hematology and Oncology)   Name of the patient: Cory [name]  [id number]  1956-06-14   Date of visit: 06/05/21  Diagnosis-  metastatic esophageal cancer with liver metastases  Chief complaint/ Reason for visit-on treatment assessment prior to cycle 24 of 5-FU trastuzumab chemotherapy  Heme/Onc history: Patient is a 65 year old male who presented with symptoms of indigestion and heartburn which gradually progressed to dysphagia.  He underwent EGD on 06/20/2020 By Dr. Vicente [name] which showed a large nearly obstructing mass in the lower third of the esophagus 35 cm from incisors.  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  PD-l1 <1 %, TMB HIGH    Presently patient reports dysphagia but is able to eat cereal or soft food.  He has lost about 13 pounds in the last month itself.  Denies any significant pain.  He recently retired after many years of service and is otherwise independent of his ADLs and IADLs.     PET CT scan on 06/26/2020 showed circumferential distal esophageal mass with an SUV of 13.3 extending over 5.5 cm. Right lower paratracheal node is 0.6 cm with an SUV of 3.1. Hypermetabolic right gastric lymph nodes including 1.1 cm node with an SUV of 5.8. Numerous hypermetabolic lesions throughout the liver somewhat confluent around the periphery which were hypermetabolic between 8 and 9. Faintly hypermetabolic left adrenal gland   NGS testing showed high tumor mutational burden CPS score less than 1. CD9-NRG1 fusion. BRAF 581L, CCN E1 gain.  EMLA for fusion of BX [id number], FGF 23 gain, FGF 6 gain, MET R988C, T p53 S1 41F. ERBB2 gain but no other actionable mutations   Patient currently on  first-line FOLFOX/trastuzumab/Keytruda.  He gets Keytruda every 6 weeks    Interval history-patient denies any headaches, visual issues or focal weakness or numbness.  His main concern is his ongoing right shoulder pain which sometimes makes it difficult for him to get a good night sleep.  He is still using his oxycodone sparingly.  ECOG PS- 1 Pain scale- 4 Opioid associated constipation- no  Review of systems- Review of Systems  Constitutional:  Positive for malaise/fatigue. Negative for chills, fever and weight loss.  HENT:  Negative for congestion, ear discharge and nosebleeds.   Eyes:  Negative for blurred vision.  Respiratory:  Negative for cough, hemoptysis, sputum production, shortness of breath and wheezing.   Cardiovascular:  Negative for chest pain, palpitations, orthopnea and claudication.  Gastrointestinal:  Negative for abdominal pain, blood in stool, constipation, diarrhea, heartburn, melena, nausea and vomiting.  Genitourinary:  Negative for dysuria, flank pain, frequency, hematuria and urgency.  Musculoskeletal:  Negative for back pain, joint pain and myalgias.       Right shoulder pain  Skin:  Negative for rash.  Neurological:  Negative for dizziness, tingling, focal weakness, seizures, weakness and headaches.  Endo/Heme/Allergies:  Does not bruise/bleed easily.  Psychiatric/Behavioral:  Negative for depression and suicidal ideas. The patient does not have insomnia.      No Known Allergies   Past Medical History:  Diagnosis Date   Esophageal adenocarcinoma ([place]) 06/23/2020   Family history of brain cancer    Family history of colon cancer    Family history of melanoma    Family history of  Hematology/Oncology Consult note [facility]  [phone] [phone]  Patient Care Team: Cory Carbon, MD as PCP - General Cory Jacks, RN as Oncology Nurse Navigator Cory Guadeloupe, MD as Consulting Physician (Hematology and Oncology)   Name of the patient: Cory [name]  [id number]  1956-06-14   Date of visit: 06/05/21  Diagnosis-  metastatic esophageal cancer with liver metastases  Chief complaint/ Reason for visit-on treatment assessment prior to cycle 24 of 5-FU trastuzumab chemotherapy  Heme/Onc history: Patient is a 65 year old male who presented with symptoms of indigestion and heartburn which gradually progressed to dysphagia.  He underwent EGD on 06/20/2020 By Dr. Vicente [name] which showed a large nearly obstructing mass in the lower third of the esophagus 35 cm from incisors.  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  PD-l1 <1 %, TMB HIGH    Presently patient reports dysphagia but is able to eat cereal or soft food.  He has lost about 13 pounds in the last month itself.  Denies any significant pain.  He recently retired after many years of service and is otherwise independent of his ADLs and IADLs.     PET CT scan on 06/26/2020 showed circumferential distal esophageal mass with an SUV of 13.3 extending over 5.5 cm. Right lower paratracheal node is 0.6 cm with an SUV of 3.1. Hypermetabolic right gastric lymph nodes including 1.1 cm node with an SUV of 5.8. Numerous hypermetabolic lesions throughout the liver somewhat confluent around the periphery which were hypermetabolic between 8 and 9. Faintly hypermetabolic left adrenal gland   NGS testing showed high tumor mutational burden CPS score less than 1. CD9-NRG1 fusion. BRAF 581L, CCN E1 gain.  EMLA for fusion of BX [id number], FGF 23 gain, FGF 6 gain, MET R988C, T p53 S1 41F. ERBB2 gain but no other actionable mutations   Patient currently on  first-line FOLFOX/trastuzumab/Keytruda.  He gets Keytruda every 6 weeks    Interval history-patient denies any headaches, visual issues or focal weakness or numbness.  His main concern is his ongoing right shoulder pain which sometimes makes it difficult for him to get a good night sleep.  He is still using his oxycodone sparingly.  ECOG PS- 1 Pain scale- 4 Opioid associated constipation- no  Review of systems- Review of Systems  Constitutional:  Positive for malaise/fatigue. Negative for chills, fever and weight loss.  HENT:  Negative for congestion, ear discharge and nosebleeds.   Eyes:  Negative for blurred vision.  Respiratory:  Negative for cough, hemoptysis, sputum production, shortness of breath and wheezing.   Cardiovascular:  Negative for chest pain, palpitations, orthopnea and claudication.  Gastrointestinal:  Negative for abdominal pain, blood in stool, constipation, diarrhea, heartburn, melena, nausea and vomiting.  Genitourinary:  Negative for dysuria, flank pain, frequency, hematuria and urgency.  Musculoskeletal:  Negative for back pain, joint pain and myalgias.       Right shoulder pain  Skin:  Negative for rash.  Neurological:  Negative for dizziness, tingling, focal weakness, seizures, weakness and headaches.  Endo/Heme/Allergies:  Does not bruise/bleed easily.  Psychiatric/Behavioral:  Negative for depression and suicidal ideas. The patient does not have insomnia.      No Known Allergies   Past Medical History:  Diagnosis Date   Esophageal adenocarcinoma ([place]) 06/23/2020   Family history of brain cancer    Family history of colon cancer    Family history of melanoma    Family history of  CT findings: Rounded pulmonary nodule in the medial RIGHT lower lobe along the pleural surface adjacent to the spine measures 10 mm on image 133/3. This nodule is new from comparison exam but does not have metabolic activity. ABDOMEN/PELVIS: Interval resolution of the multifocal hypermetabolic lesions within the liver. No discrete hypermetabolic lesions above background above liver activity. Resolution of the previous seen hypermetabolic gastrohepatic ligament lymph nodes. Incidental CT findings: Liver has a nodular contour. Atherosclerotic calcification of the aorta. SKELETON: There is uniform increase in marrow activity throughout the pelvis, spine ribs and sternum. Findings most suggestive of a GCSF type response. Intense metabolic activity associated with fracture of the proximal RIGHT humerus. Early callus formation. Incidental CT findings: none IMPRESSION: 1. Resolution of activity through the esophagus. 2. No evidence of metastatic mediastinal lymph nodes. 3. New medial RIGHT lower lobe pulmonary nodule without metabolic activity. Recommend attention on follow-up. 4. Resolution of multifocal hypermetabolic hepatic metastasis. 5. Resolution of hypermetabolic gastrohepatic ligament lymph nodes. 6. No evidence of active esophageal carcinoma. 7. Intense marrow activity typical of GCSF type response. 8. Intense metabolic activity associated with subacute RIGHT humerus fracture. Electronically Signed: By: Cory [name] M.D. On: 05/22/2021 16:09   ECHOCARDIOGRAM COMPLETE  Result Date: 05/21/2021    ECHOCARDIOGRAM REPORT   Patient Name:   Cory [name] Date of Exam: 05/21/2021 Medical Rec #:  [id number]      Height:       71.0 in Accession #:    [id number]     Weight:       199.7 lb Date of Birth:  [birth year]     BSA:          2.107 m Patient Age:    65 years       BP:           157/75 mmHg Patient Gender: M              HR:           64 bpm.  Exam Location:  [facility] Procedure: 2D Echo, Cardiac Doppler, Color Doppler and Strain Analysis Indications:     Esophageal adenocarcinoma  History:         Patient has prior history of Echocardiogram examinations, most                  recent 02/15/2021. Risk Factors:Hypertension.  Sonographer:     Cory [name] Referring Phys:  [id number] Cory [name] Diagnosing Phys: Cory Sacramento MD  Sonographer Comments: Global longitudinal strain was attempted. IMPRESSIONS  1. Left ventricular ejection fraction, by estimation, is 50 to 55%. The left ventricle has low normal function. The left ventricle has no regional wall motion abnormalities. The left ventricular internal cavity size was mildly dilated. Left ventricular diastolic parameters are indeterminate.  2. Right ventricular systolic function is normal. The right ventricular size is normal. Tricuspid regurgitation signal is inadequate for assessing PA pressure.  3. Left atrial size was mildly dilated.  4. The mitral valve is normal in structure. Mild mitral valve regurgitation. No evidence of mitral stenosis.  5. The aortic valve is normal in structure. Aortic valve regurgitation is not visualized. No aortic stenosis is present.  6. The inferior vena cava is normal in size with greater than 50% respiratory variability, suggesting right atrial pressure of 3 mmHg. Comparison(s): A prior study was performed on 02/2021. Changes from prior study are noted. EF slightly decreased. FINDINGS  Left Ventricle: Left ventricular ejection fraction, by estimation, is 50 to 55%.  CT findings: Rounded pulmonary nodule in the medial RIGHT lower lobe along the pleural surface adjacent to the spine measures 10 mm on image 133/3. This nodule is new from comparison exam but does not have metabolic activity. ABDOMEN/PELVIS: Interval resolution of the multifocal hypermetabolic lesions within the liver. No discrete hypermetabolic lesions above background above liver activity. Resolution of the previous seen hypermetabolic gastrohepatic ligament lymph nodes. Incidental CT findings: Liver has a nodular contour. Atherosclerotic calcification of the aorta. SKELETON: There is uniform increase in marrow activity throughout the pelvis, spine ribs and sternum. Findings most suggestive of a GCSF type response. Intense metabolic activity associated with fracture of the proximal RIGHT humerus. Early callus formation. Incidental CT findings: none IMPRESSION: 1. Resolution of activity through the esophagus. 2. No evidence of metastatic mediastinal lymph nodes. 3. New medial RIGHT lower lobe pulmonary nodule without metabolic activity. Recommend attention on follow-up. 4. Resolution of multifocal hypermetabolic hepatic metastasis. 5. Resolution of hypermetabolic gastrohepatic ligament lymph nodes. 6. No evidence of active esophageal carcinoma. 7. Intense marrow activity typical of GCSF type response. 8. Intense metabolic activity associated with subacute RIGHT humerus fracture. Electronically Signed: By: Cory [name] M.D. On: 05/22/2021 16:09   ECHOCARDIOGRAM COMPLETE  Result Date: 05/21/2021    ECHOCARDIOGRAM REPORT   Patient Name:   Cory [name] Date of Exam: 05/21/2021 Medical Rec #:  [id number]      Height:       71.0 in Accession #:    [id number]     Weight:       199.7 lb Date of Birth:  [birth year]     BSA:          2.107 m Patient Age:    65 years       BP:           157/75 mmHg Patient Gender: M              HR:           64 bpm.  Exam Location:  [facility] Procedure: 2D Echo, Cardiac Doppler, Color Doppler and Strain Analysis Indications:     Esophageal adenocarcinoma  History:         Patient has prior history of Echocardiogram examinations, most                  recent 02/15/2021. Risk Factors:Hypertension.  Sonographer:     Cory [name] Referring Phys:  [id number] Cory [name] Diagnosing Phys: Cory Sacramento MD  Sonographer Comments: Global longitudinal strain was attempted. IMPRESSIONS  1. Left ventricular ejection fraction, by estimation, is 50 to 55%. The left ventricle has low normal function. The left ventricle has no regional wall motion abnormalities. The left ventricular internal cavity size was mildly dilated. Left ventricular diastolic parameters are indeterminate.  2. Right ventricular systolic function is normal. The right ventricular size is normal. Tricuspid regurgitation signal is inadequate for assessing PA pressure.  3. Left atrial size was mildly dilated.  4. The mitral valve is normal in structure. Mild mitral valve regurgitation. No evidence of mitral stenosis.  5. The aortic valve is normal in structure. Aortic valve regurgitation is not visualized. No aortic stenosis is present.  6. The inferior vena cava is normal in size with greater than 50% respiratory variability, suggesting right atrial pressure of 3 mmHg. Comparison(s): A prior study was performed on 02/2021. Changes from prior study are noted. EF slightly decreased. FINDINGS  Left Ventricle: Left ventricular ejection fraction, by estimation, is 50 to 55%.

## 2021-06-05 NOTE — Patient Instructions (Signed)
[facility]  Discharge Instructions: Thank you for choosing [facility] to provide your oncology and hematology care.  If you have a lab appointment with the [facility], please go directly to the [facility] and check in at the registration area.  Wear comfortable clothing and clothing appropriate for easy access to any Portacath or PICC line.   We strive to give you quality time with your provider. You may need to reschedule your appointment if you arrive late (15 or more minutes).  Arriving late affects you and other patients whose appointments are after yours.  Also, if you miss three or more appointments without notifying the office, you may be dismissed from the clinic at the provider's discretion.      For prescription refill requests, have your pharmacy contact our office and allow 72 hours for refills to be completed.    Today you received the following chemotherapy and/or immunotherapy agents KANJINTI, LEUCOVORIN, 5 FU      To help prevent nausea and vomiting after your treatment, we encourage you to take your nausea medication as directed.  BELOW ARE SYMPTOMS THAT SHOULD BE REPORTED IMMEDIATELY: *FEVER GREATER THAN 100.4 F (38 C) OR HIGHER *CHILLS OR SWEATING *NAUSEA AND VOMITING THAT IS NOT CONTROLLED WITH YOUR NAUSEA MEDICATION *UNUSUAL SHORTNESS OF BREATH *UNUSUAL BRUISING OR BLEEDING *URINARY PROBLEMS (pain or burning when urinating, or frequent urination) *BOWEL PROBLEMS (unusual diarrhea, constipation, pain near the anus) TENDERNESS IN MOUTH AND THROAT WITH OR WITHOUT PRESENCE OF ULCERS (sore throat, sores in mouth, or a toothache) UNUSUAL RASH, SWELLING OR PAIN  UNUSUAL VAGINAL DISCHARGE OR ITCHING   Items with * indicate a potential emergency and should be followed up as soon as possible or go to the Emergency Department if any problems should occur.  Please show the CHEMOTHERAPY ALERT CARD or IMMUNOTHERAPY ALERT  CARD at check-in to the Emergency Department and triage nurse.  Should you have questions after your visit or need to cancel or reschedule your appointment, please contact [facility]  [phone] and follow the prompts.  Office hours are 8:00 a.m. to 4:30 p.m. Monday - Friday. Please note that voicemails left after 4:00 p.m. may not be returned until the following business day.  We are closed weekends and major holidays. You have access to a nurse at all times for urgent questions. Please call the main number to the clinic [phone] and follow the prompts.  For any non-urgent questions, you may also contact your provider using MyChart. We now offer e-Visits for anyone 66 and older to request care online for non-urgent symptoms. For details visit [email or web address].   Also download the MyChart app! Go to the app store, search "MyChart", open the app, select [facility], and log in with your MyChart username and password.  Due to Covid, a mask is required upon entering the hospital/clinic. If you do not have a mask, one will be given to you upon arrival. For doctor visits, patients may have 1 support person aged 77 or older with them. For treatment visits, patients cannot have anyone with them due to current Covid guidelines and our immunocompromised population.   Trastuzumab injection for infusion What is this medication? TRASTUZUMAB (tras TOO zoo mab) is a monoclonal antibody. It is used to treat breast cancer and stomach cancer. This medicine may be used for other purposes; ask your health care provider or pharmacist if you have questions. COMMON BRAND NAME(S): Herceptin, Belenda Cruise,  Garvin Fila What should I tell my care team before I take this medication? They need to know if you have any of these conditions: heart disease heart failure lung or breathing disease, like asthma an unusual or allergic reaction to trastuzumab,  benzyl alcohol, or other medications, foods, dyes, or preservatives pregnant or trying to get pregnant breast-feeding How should I use this medication? This drug is given as an infusion into a vein. It is administered in a hospital or clinic by a specially trained health care professional. Talk to your pediatrician regarding the use of this medicine in children. This medicine is not approved for use in children. Overdosage: If you think you have taken too much of this medicine contact a poison control center or emergency room at once. NOTE: This medicine is only for you. Do not share this medicine with others. What if I miss a dose? It is important not to miss a dose. Call your doctor or health care professional if you are unable to keep an appointment. What may interact with this medication? This medicine may interact with the following medications: certain types of chemotherapy, such as daunorubicin, doxorubicin, epirubicin, and idarubicin This list may not describe all possible interactions. Give your health care provider a list of all the medicines, herbs, non-prescription drugs, or dietary supplements you use. Also tell them if you smoke, drink alcohol, or use illegal drugs. Some items may interact with your medicine. What should I watch for while using this medication? Visit your doctor for checks on your progress. Report any side effects. Continue your course of treatment even though you feel ill unless your doctor tells you to stop. Call your doctor or health care professional for advice if you get a fever, chills or sore throat, or other symptoms of a cold or flu. Do not treat yourself. Try to avoid being around people who are sick. You may experience fever, chills and shaking during your first infusion. These effects are usually mild and can be treated with other medicines. Report any side effects during the infusion to your health care professional. Fever and chills usually do not happen  with later infusions. Do not become pregnant while taking this medicine or for 7 months after stopping it. Women should inform their doctor if they wish to become pregnant or think they might be pregnant. Women of child-bearing potential will need to have a negative pregnancy test before starting this medicine. There is a potential for serious side effects to an unborn child. Talk to your health care professional or pharmacist for more information. Do not breast-feed an infant while taking this medicine or for 7 months after stopping it. Women must use effective birth control with this medicine. What side effects may I notice from receiving this medication? Side effects that you should report to your doctor or health care professional as soon as possible: allergic reactions like skin rash, itching or hives, swelling of the face, lips, or tongue chest pain or palpitations cough dizziness feeling faint or lightheaded, falls fever general ill feeling or flu-like symptoms signs of worsening heart failure like breathing problems; swelling in your legs and feet unusually weak or tired Side effects that usually do not require medical attention (report to your doctor or health care professional if they continue or are bothersome): bone pain changes in taste diarrhea joint pain nausea/vomiting weight loss This list may not describe all possible side effects. Call your doctor for medical advice about side effects. You may report  side effects to FDA at 1-800-FDA-1088. Where should I keep my medication? This drug is given in a hospital or clinic and will not be stored at home. NOTE: This sheet is a summary. It may not cover all possible information. If you have questions about this medicine, talk to your doctor, pharmacist, or health care provider.  2022 Elsevier/Gold Standard (2016-07-09 00:00:00)  Leucovorin injection What is this medication? LEUCOVORIN (loo koe VOR in) is used to prevent or  treat the harmful effects of some medicines. This medicine is used to treat anemia caused by a low amount of folic acid in the body. It is also used with 5-fluorouracil (5-FU) to treat colon cancer. This medicine may be used for other purposes; ask your health care provider or pharmacist if you have questions. What should I tell my care team before I take this medication? They need to know if you have any of these conditions: anemia from low levels of vitamin B-12 in the blood an unusual or allergic reaction to leucovorin, folic acid, other medicines, foods, dyes, or preservatives pregnant or trying to get pregnant breast-feeding How should I use this medication? This medicine is for injection into a muscle or into a vein. It is given by a health care professional in a hospital or clinic setting. Talk to your pediatrician regarding the use of this medicine in children. Special care may be needed. Overdosage: If you think you have taken too much of this medicine contact a poison control center or emergency room at once. NOTE: This medicine is only for you. Do not share this medicine with others. What if I miss a dose? This does not apply. What may interact with this medication? capecitabine fluorouracil phenobarbital phenytoin primidone trimethoprim-sulfamethoxazole This list may not describe all possible interactions. Give your health care provider a list of all the medicines, herbs, non-prescription drugs, or dietary supplements you use. Also tell them if you smoke, drink alcohol, or use illegal drugs. Some items may interact with your medicine. What should I watch for while using this medication? Your condition will be monitored carefully while you are receiving this medicine. This medicine may increase the side effects of 5-fluorouracil, 5-FU. Tell your doctor or health care professional if you have diarrhea or mouth sores that do not get better or that get worse. What side effects may I  notice from receiving this medication? Side effects that you should report to your doctor or health care professional as soon as possible: allergic reactions like skin rash, itching or hives, swelling of the face, lips, or tongue breathing problems fever, infection mouth sores unusual bleeding or bruising unusually weak or tired Side effects that usually do not require medical attention (report to your doctor or health care professional if they continue or are bothersome): constipation or diarrhea loss of appetite nausea, vomiting This list may not describe all possible side effects. Call your doctor for medical advice about side effects. You may report side effects to FDA at 1-800-FDA-1088. Where should I keep my medication? This drug is given in a hospital or clinic and will not be stored at home. NOTE: This sheet is a summary. It may not cover all possible information. If you have questions about this medicine, talk to your doctor, pharmacist, or health care provider.  2022 Elsevier/Gold Standard (2007-12-31 00:00:00)  Fluorouracil, 5-FU injection What is this medication? FLUOROURACIL, 5-FU (flure oh YOOR a sil) is a chemotherapy drug. It slows the growth of cancer cells. This medicine is used  to treat many types of cancer like breast cancer, colon or rectal cancer, pancreatic cancer, and stomach cancer. This medicine may be used for other purposes; ask your health care provider or pharmacist if you have questions. COMMON BRAND NAME(S): Adrucil What should I tell my care team before I take this medication? They need to know if you have any of these conditions: blood disorders dihydropyrimidine dehydrogenase (DPD) deficiency infection (especially a virus infection such as chickenpox, cold sores, or herpes) kidney disease liver disease malnourished, poor nutrition recent or ongoing radiation therapy an unusual or allergic reaction to fluorouracil, other chemotherapy, other  medicines, foods, dyes, or preservatives pregnant or trying to get pregnant breast-feeding How should I use this medication? This drug is given as an infusion or injection into a vein. It is administered in a hospital or clinic by a specially trained health care professional. Talk to your pediatrician regarding the use of this medicine in children. Special care may be needed. Overdosage: If you think you have taken too much of this medicine contact a poison control center or emergency room at once. NOTE: This medicine is only for you. Do not share this medicine with others. What if I miss a dose? It is important not to miss your dose. Call your doctor or health care professional if you are unable to keep an appointment. What may interact with this medication? Do not take this medicine with any of the following medications: live virus vaccines This medicine may also interact with the following medications: medicines that treat or prevent blood clots like warfarin, enoxaparin, and dalteparin This list may not describe all possible interactions. Give your health care provider a list of all the medicines, herbs, non-prescription drugs, or dietary supplements you use. Also tell them if you smoke, drink alcohol, or use illegal drugs. Some items may interact with your medicine. What should I watch for while using this medication? Visit your doctor for checks on your progress. This drug may make you feel generally unwell. This is not uncommon, as chemotherapy can affect healthy cells as well as cancer cells. Report any side effects. Continue your course of treatment even though you feel ill unless your doctor tells you to stop. In some cases, you may be given additional medicines to help with side effects. Follow all directions for their use. Call your doctor or health care professional for advice if you get a fever, chills or sore throat, or other symptoms of a cold or flu. Do not treat yourself. This  drug decreases your body's ability to fight infections. Try to avoid being around people who are sick. This medicine may increase your risk to bruise or bleed. Call your doctor or health care professional if you notice any unusual bleeding. Be careful brushing and flossing your teeth or using a toothpick because you may get an infection or bleed more easily. If you have any dental work done, tell your dentist you are receiving this medicine. Avoid taking products that contain aspirin, acetaminophen, ibuprofen, naproxen, or ketoprofen unless instructed by your doctor. These medicines may hide a fever. Do not become pregnant while taking this medicine. Women should inform their doctor if they wish to become pregnant or think they might be pregnant. There is a potential for serious side effects to an unborn child. Talk to your health care professional or pharmacist for more information. Do not breast-feed an infant while taking this medicine. Men should inform their doctor if they wish to father a child. This  medicine may lower sperm counts. Do not treat diarrhea with over the counter products. Contact your doctor if you have diarrhea that lasts more than 2 days or if it is severe and watery. This medicine can make you more sensitive to the sun. Keep out of the sun. If you cannot avoid being in the sun, wear protective clothing and use sunscreen. Do not use sun lamps or tanning beds/booths. What side effects may I notice from receiving this medication? Side effects that you should report to your doctor or health care professional as soon as possible: allergic reactions like skin rash, itching or hives, swelling of the face, lips, or tongue low blood counts - this medicine may decrease the number of white blood cells, red blood cells and platelets. You may be at increased risk for infections and bleeding. signs of infection - fever or chills, cough, sore throat, pain or difficulty passing urine signs of  decreased platelets or bleeding - bruising, pinpoint red spots on the skin, black, tarry stools, blood in the urine signs of decreased red blood cells - unusually weak or tired, fainting spells, lightheadedness breathing problems changes in vision chest pain mouth sores nausea and vomiting pain, swelling, redness at site where injected pain, tingling, numbness in the hands or feet redness, swelling, or sores on hands or feet stomach pain unusual bleeding Side effects that usually do not require medical attention (report to your doctor or health care professional if they continue or are bothersome): changes in finger or toe nails diarrhea dry or itchy skin hair loss headache loss of appetite sensitivity of eyes to the light stomach upset unusually teary eyes This list may not describe all possible side effects. Call your doctor for medical advice about side effects. You may report side effects to FDA at 1-800-FDA-1088. Where should I keep my medication? This drug is given in a hospital or clinic and will not be stored at home. NOTE: This sheet is a summary. It may not cover all possible information. If you have questions about this medicine, talk to your doctor, pharmacist, or health care provider.  2022 Elsevier/Gold Standard (2021-03-13 00:00:00)

## 2021-06-05 NOTE — Progress Notes (Signed)
Pt states that he has been seeing flutters more often states it flashes everywhere but doe snot happen often. Daughter will like to know if pt is able to take CBD or "DELTA 8"and ibuprofren. Due to arm pain and not being to sleep well.

## 2021-06-06 LAB — CEA: CEA: 190 ng/mL — ABNORMAL HIGH (ref 0.0–4.7)

## 2021-06-07 ENCOUNTER — Other Ambulatory Visit: Payer: Self-pay

## 2021-06-07 ENCOUNTER — Inpatient Hospital Stay: Payer: Medicare Other | Attending: Oncology

## 2021-06-07 DIAGNOSIS — R1310 Dysphagia, unspecified: Secondary | ICD-10-CM | POA: Insufficient documentation

## 2021-06-07 DIAGNOSIS — Z808 Family history of malignant neoplasm of other organs or systems: Secondary | ICD-10-CM | POA: Insufficient documentation

## 2021-06-07 DIAGNOSIS — Z5189 Encounter for other specified aftercare: Secondary | ICD-10-CM | POA: Insufficient documentation

## 2021-06-07 DIAGNOSIS — C787 Secondary malignant neoplasm of liver and intrahepatic bile duct: Secondary | ICD-10-CM | POA: Diagnosis not present

## 2021-06-07 DIAGNOSIS — K219 Gastro-esophageal reflux disease without esophagitis: Secondary | ICD-10-CM | POA: Insufficient documentation

## 2021-06-07 DIAGNOSIS — I10 Essential (primary) hypertension: Secondary | ICD-10-CM | POA: Insufficient documentation

## 2021-06-07 DIAGNOSIS — Z5111 Encounter for antineoplastic chemotherapy: Secondary | ICD-10-CM | POA: Insufficient documentation

## 2021-06-07 DIAGNOSIS — Z5112 Encounter for antineoplastic immunotherapy: Secondary | ICD-10-CM | POA: Insufficient documentation

## 2021-06-07 DIAGNOSIS — C155 Malignant neoplasm of lower third of esophagus: Secondary | ICD-10-CM | POA: Insufficient documentation

## 2021-06-07 DIAGNOSIS — D696 Thrombocytopenia, unspecified: Secondary | ICD-10-CM | POA: Diagnosis not present

## 2021-06-07 DIAGNOSIS — Z79899 Other long term (current) drug therapy: Secondary | ICD-10-CM | POA: Diagnosis not present

## 2021-06-07 DIAGNOSIS — Z8049 Family history of malignant neoplasm of other genital organs: Secondary | ICD-10-CM | POA: Insufficient documentation

## 2021-06-07 DIAGNOSIS — D72829 Elevated white blood cell count, unspecified: Secondary | ICD-10-CM | POA: Insufficient documentation

## 2021-06-07 DIAGNOSIS — M25511 Pain in right shoulder: Secondary | ICD-10-CM | POA: Diagnosis not present

## 2021-06-07 DIAGNOSIS — Z87891 Personal history of nicotine dependence: Secondary | ICD-10-CM | POA: Diagnosis not present

## 2021-06-07 DIAGNOSIS — Z8249 Family history of ischemic heart disease and other diseases of the circulatory system: Secondary | ICD-10-CM | POA: Insufficient documentation

## 2021-06-07 DIAGNOSIS — Z800 Family history of malignant neoplasm of digestive organs: Secondary | ICD-10-CM | POA: Insufficient documentation

## 2021-06-07 DIAGNOSIS — Z833 Family history of diabetes mellitus: Secondary | ICD-10-CM | POA: Diagnosis not present

## 2021-06-07 DIAGNOSIS — C159 Malignant neoplasm of esophagus, unspecified: Secondary | ICD-10-CM

## 2021-06-07 DIAGNOSIS — Z8042 Family history of malignant neoplasm of prostate: Secondary | ICD-10-CM | POA: Diagnosis not present

## 2021-06-07 MED ORDER — SODIUM CHLORIDE 0.9% FLUSH
10.0000 mL | INTRAVENOUS | Status: DC | PRN
  Filled 2021-06-07: qty 10

## 2021-06-07 MED ORDER — PEGFILGRASTIM-CBQV 6 MG/0.6ML ~~LOC~~ SOSY
6.0000 mg | PREFILLED_SYRINGE | Freq: Once | SUBCUTANEOUS | Status: AC
  Administered 2021-06-07: 6 mg via SUBCUTANEOUS
  Filled 2021-06-07: qty 0.6

## 2021-06-07 MED ORDER — HEPARIN SOD (PORK) LOCK FLUSH 100 UNIT/ML IV SOLN
INTRAVENOUS | Status: AC
  Administered 2021-06-07: 500 [IU]
  Filled 2021-06-07: qty 5

## 2021-06-07 MED ORDER — HEPARIN SOD (PORK) LOCK FLUSH 100 UNIT/ML IV SOLN
500.0000 [IU] | Freq: Once | INTRAVENOUS | Status: AC | PRN
  Filled 2021-06-07: qty 5

## 2021-06-08 ENCOUNTER — Other Ambulatory Visit: Payer: Self-pay | Admitting: *Deleted

## 2021-06-08 DIAGNOSIS — C159 Malignant neoplasm of esophagus, unspecified: Secondary | ICD-10-CM

## 2021-06-08 MED ORDER — DEXAMETHASONE 4 MG PO TABS: 8.0000 mg | ORAL_TABLET | Freq: Every day | ORAL | 1 refills | Status: DC

## 2021-06-11 ENCOUNTER — Ambulatory Visit
Admission: RE | Admit: 2021-06-11 | Discharge: 2021-06-11 | Disposition: A | Discharge status: Home / Self Care | Payer: Medicare Other | Source: Ambulatory Visit | Attending: Radiation Oncology | Admitting: Radiation Oncology

## 2021-06-11 ENCOUNTER — Other Ambulatory Visit: Payer: Self-pay

## 2021-06-11 ENCOUNTER — Other Ambulatory Visit: Payer: Self-pay | Admitting: *Deleted

## 2021-06-11 VITALS — BP 146/85 | HR 83 | Temp 97.9°F | Resp 18 | Wt 195.0 lb

## 2021-06-11 DIAGNOSIS — C155 Malignant neoplasm of lower third of esophagus: Secondary | ICD-10-CM

## 2021-06-11 DIAGNOSIS — C7931 Secondary malignant neoplasm of brain: Secondary | ICD-10-CM

## 2021-06-11 NOTE — Progress Notes (Signed)
Radiation Oncology Follow up Note old patient new area of solitary brain metastasis  Name: Cory [name]   Date:   06/11/2021 MRN:  [id number] DOB: 1955/07/12    This 65 y.o. 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  REFERRING PROVIDER: Venia Carbon, MD  HPI: Patient is a 65 year old male well-known to our department having completed palliative radiation therapy to his distal esophagus 10 months prior for stage IV esophageal carcinoma..  Currently he is doing well is currently on first-line FOLFOX trasuzumab and Keytruda.  He did have a fracture of his right upper extremity which is in pain and is healing by secondary intention.  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  He did have picked up on PET a hyperdensity in the posterior left occipital lobe adjacent to the falx which corresponds with MRI findings in November.  He has been having what he describes as floaters in his vision.  He was noted on MRI scan to have a supratentorial infratentorial dural based mass along the inferior falx and tentorium.  There was mild left occipital lobe edema.  The tumor abuts and invades the straight sinus and proximal right transverse sinus.  Differential with a dural based metastasis versus meningioma with metastasis more likely as this lesion was not present on the PET/CT of 2021 he is seen today for consideration of SRS to this lesion.  COMPLICATIONS OF TREATMENT: none  FOLLOW UP COMPLIANCE: keeps appointments   PHYSICAL EXAM:  BP (!) 146/85 (BP Location: Left Arm, Patient Position: Sitting)   Pulse 83   Temp 97.9 F (36.6 C) (Tympanic)   Resp 18    Wt 195 lb (88.5 kg)   BMI 27.20 kg/m  Crude visual fields within normal range there are some limitation of motion in his right upper extremity secondary to the recent fracture.  Motor or sensory and DTR levels are equal and symmetric in the upper lower extremities.  Proprioception is intact.  Well-developed well-nourished patient in NAD. HEENT reveals PERLA, EOMI, discs not visualized.  Oral cavity is clear. No oral mucosal lesions are identified. Neck is clear without evidence of cervical or supraclavicular adenopathy. Lungs are clear to A&P. Cardiac examination is essentially unremarkable with regular rate and rhythm without murmur rub or thrill. Abdomen is benign with no organomegaly or masses noted. Motor sensory and DTR levels are equal and symmetric in the upper and lower extremities. Cranial nerves II through XII are grossly intact. Proprioception is intact. No peripheral adenopathy or edema is identified. No motor or sensory levels are noted. Crude visual fields are within normal range.  RADIOLOGY RESULTS: PET CT scans and MRI scans reviewed compatible with above-stated findings  PLAN: At this time elect to go ahead with SRS to the brain lesion.  We will plan on delivering 20 Gray in single fraction.  I have ordered a thin cut MRI scan for treatment planning purposes.  Risks and benefits of treatment including slight incidence of brain necrosis headaches fatigue hair loss all were described in detail to the patient.  He comprehends my recommendations well.  Thin cut MRI as well as simulation has been ordered.  I would like to take this opportunity to thank you  for allowing me to participate in the care of your patient.Noreene Filbert, MD

## 2021-06-12 ENCOUNTER — Other Ambulatory Visit: Payer: Self-pay | Admitting: Oncology

## 2021-06-19 ENCOUNTER — Inpatient Hospital Stay: Payer: Medicare Other

## 2021-06-19 ENCOUNTER — Other Ambulatory Visit: Payer: Self-pay

## 2021-06-19 ENCOUNTER — Encounter: Payer: Self-pay | Admitting: Oncology

## 2021-06-19 ENCOUNTER — Inpatient Hospital Stay (HOSPITAL_BASED_OUTPATIENT_CLINIC_OR_DEPARTMENT_OTHER): Payer: Medicare Other | Admitting: Oncology

## 2021-06-19 VITALS — BP 165/79 | HR 76 | Temp 98.1°F | Ht 71.0 in | Wt 198.8 lb

## 2021-06-19 DIAGNOSIS — C159 Malignant neoplasm of esophagus, unspecified: Secondary | ICD-10-CM

## 2021-06-19 DIAGNOSIS — Z5112 Encounter for antineoplastic immunotherapy: Secondary | ICD-10-CM | POA: Diagnosis not present

## 2021-06-19 LAB — CBC WITH DIFFERENTIAL/PLATELET
Abs Immature Granulocytes: 0.08 10*3/uL — ABNORMAL HIGH (ref 0.00–0.07)
Basophils Absolute: 0.1 10*3/uL (ref 0.0–0.1)
Basophils Relative: 0 %
Eosinophils Absolute: 0.5 10*3/uL (ref 0.0–0.5)
Eosinophils Relative: 5 %
HCT: 37.5 % — ABNORMAL LOW (ref 39.0–52.0)
Hemoglobin: 12.4 g/dL — ABNORMAL LOW (ref 13.0–17.0)
Immature Granulocytes: 1 %
Lymphocytes Relative: 9 %
Lymphs Abs: 1.1 10*3/uL (ref 0.7–4.0)
MCH: 30.1 pg (ref 26.0–34.0)
MCHC: 33.1 g/dL (ref 30.0–36.0)
MCV: 91 fL (ref 80.0–100.0)
Monocytes Absolute: 0.7 10*3/uL (ref 0.1–1.0)
Monocytes Relative: 6 %
Neutro Abs: 8.8 10*3/uL — ABNORMAL HIGH (ref 1.7–7.7)
Neutrophils Relative %: 79 %
Platelets: 129 10*3/uL — ABNORMAL LOW (ref 150–400)
RBC: 4.12 MIL/uL — ABNORMAL LOW (ref 4.22–5.81)
RDW: 16.1 % — ABNORMAL HIGH (ref 11.5–15.5)
WBC: 11.2 10*3/uL — ABNORMAL HIGH (ref 4.0–10.5)
nRBC: 0 % (ref 0.0–0.2)

## 2021-06-19 LAB — COMPREHENSIVE METABOLIC PANEL
ALT: 22 U/L (ref 0–44)
AST: 28 U/L (ref 15–41)
Albumin: 4.1 g/dL (ref 3.5–5.0)
Alkaline Phosphatase: 204 U/L — ABNORMAL HIGH (ref 38–126)
Anion gap: 11 (ref 5–15)
BUN: 12 mg/dL (ref 8–23)
CO2: 25 mmol/L (ref 22–32)
Calcium: 9.3 mg/dL (ref 8.9–10.3)
Chloride: 102 mmol/L (ref 98–111)
Creatinine, Ser: 0.74 mg/dL (ref 0.61–1.24)
GFR, Estimated: >60 mL/min (ref >60)
Glucose, Bld: 118 mg/dL — ABNORMAL HIGH (ref 70–99)
Potassium: 3.5 mmol/L (ref 3.5–5.1)
Sodium: 138 mmol/L (ref 135–145)
Total Bilirubin: 0.6 mg/dL (ref 0.3–1.2)
Total Protein: 6.9 g/dL (ref 6.5–8.1)

## 2021-06-19 MED ORDER — FLUOROURACIL CHEMO INJECTION 2.5 GM/50ML
400.0000 mg/m2 | Freq: Once | INTRAVENOUS | Status: AC
  Administered 2021-06-19: 850 mg via INTRAVENOUS
  Filled 2021-06-19: qty 17

## 2021-06-19 MED ORDER — SODIUM CHLORIDE 0.9 % IV SOLN
400.0000 mg | Freq: Once | INTRAVENOUS | Status: AC
  Administered 2021-06-19: 400 mg via INTRAVENOUS
  Filled 2021-06-19: qty 16

## 2021-06-19 MED ORDER — DIPHENHYDRAMINE HCL 50 MG/ML IJ SOLN
INTRAMUSCULAR | Status: AC
  Filled 2021-06-19: qty 1

## 2021-06-19 MED ORDER — SODIUM CHLORIDE 0.9 % IV SOLN
Freq: Once | INTRAVENOUS | Status: AC
  Filled 2021-06-19: qty 250

## 2021-06-19 MED ORDER — PALONOSETRON HCL INJECTION 0.25 MG/5ML
0.2500 mg | Freq: Once | INTRAVENOUS | Status: AC
  Administered 2021-06-19: 0.25 mg via INTRAVENOUS

## 2021-06-19 MED ORDER — ACETAMINOPHEN 325 MG PO TABS
650.0000 mg | ORAL_TABLET | Freq: Once | ORAL | Status: AC
  Administered 2021-06-19: 650 mg via ORAL

## 2021-06-19 MED ORDER — SODIUM CHLORIDE 0.9 % IV SOLN
10.0000 mg | Freq: Once | INTRAVENOUS | Status: AC
  Administered 2021-06-19: 10 mg via INTRAVENOUS
  Filled 2021-06-19: qty 10

## 2021-06-19 MED ORDER — SODIUM CHLORIDE 0.9% FLUSH
10.0000 mL | INTRAVENOUS | Status: DC | PRN
  Filled 2021-06-19: qty 10

## 2021-06-19 MED ORDER — SODIUM CHLORIDE 0.9 % IV SOLN
5000.0000 mg | INTRAVENOUS | Status: DC
  Administered 2021-06-19: 5000 mg via INTRAVENOUS
  Filled 2021-06-19: qty 100

## 2021-06-19 MED ORDER — TRASTUZUMAB-ANNS CHEMO 150 MG IV SOLR
4.0000 mg/kg | Freq: Once | INTRAVENOUS | Status: AC
  Administered 2021-06-19: 357 mg via INTRAVENOUS
  Filled 2021-06-19: qty 17

## 2021-06-19 MED ORDER — DIPHENHYDRAMINE HCL 50 MG/ML IJ SOLN
50.0000 mg | Freq: Once | INTRAMUSCULAR | Status: AC
  Administered 2021-06-19: 50 mg via INTRAVENOUS

## 2021-06-19 MED ORDER — DEXTROSE 5 % IV SOLN
Freq: Once | INTRAVENOUS | Status: DC
  Filled 2021-06-19: qty 250

## 2021-06-19 MED ORDER — LEUCOVORIN CALCIUM INJECTION 350 MG
850.0000 mg | Freq: Once | INTRAVENOUS | Status: AC
  Administered 2021-06-19: 850 mg via INTRAVENOUS
  Filled 2021-06-19: qty 25

## 2021-06-19 NOTE — Progress Notes (Signed)
Pt states he is having an MRI of the brain tomorrow and needs instructions on doing the images with his port.

## 2021-06-19 NOTE — Progress Notes (Signed)
Hematology/Oncology Consult note [facility]  [phone] [phone]  Patient Care Team: Venia Carbon, MD as PCP - General [name], RN as Oncology Nurse Navigator [name], MD as Consulting Physician (Hematology and Oncology)   Name of the patient: Cory [name]  [id number]  [birth year]   Date of visit: 06/19/21  Diagnosis-  metastatic esophageal cancer with liver metastases  Chief complaint/ Reason for visit-on treatment assessment prior to cycle 24 of 5-FU trastuzumab chemotherapy  Heme/Onc history: Patient is a 65 year old male who presented with symptoms of indigestion and heartburn which gradually progressed to dysphagia.  He underwent EGD on 06/20/2020 By Dr. Vicente Males which showed a large nearly obstructing mass in the lower third of the esophagus 35 cm from incisors.  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  PD-l1 <1 %, TMB HIGH    Presently patient reports dysphagia but is able to eat cereal or soft food.  He has lost about 13 pounds in the last month itself.  Denies any significant pain.  He recently retired after many years of service and is otherwise independent of his ADLs and IADLs.     PET CT scan on 06/26/2020 showed circumferential distal esophageal mass with an SUV of 13.3 extending over 5.5 cm. Right lower paratracheal node is 0.6 cm with an SUV of 3.1. Hypermetabolic right gastric lymph nodes including 1.1 cm node with an SUV of 5.8. Numerous hypermetabolic lesions throughout the liver somewhat confluent around the periphery which were hypermetabolic between 8 and 9. Faintly hypermetabolic left adrenal gland   NGS testing showed high tumor mutational burden CPS score less than 1. CD9-NRG1 fusion. BRAF 581L, CCN E1 gain.  EMLA for fusion of BX [id number], FGF 23 gain, FGF 6 gain, MET R988C, T p53 S1 62F. ERBB2 gain but no other actionable mutations   Patient currently on  first-line FOLFOX/trastuzumab/Keytruda.  He gets Keytruda every 6 weeks  Interval history-presents back with his son today and feels he is doing well.  Continues to have right shoulder pain but this is improving.  Using oxycodone as needed.  He met with Dr. Donella Stade last week in reference to a brain lesion causing floaters. Unclear if mets verse meningioma and case was discussed with [place] neurologist.  Plan is for an MRI tomorrow and for SRS to brain lesion next week.   ECOG PS- 1 Pain scale- 4 Opioid associated constipation- no  Review of systems- Review of Systems  Constitutional: Negative.  Negative for chills, fever, malaise/fatigue and weight loss.  HENT:  Negative for congestion, ear pain and tinnitus.   Eyes: Negative.  Negative for blurred vision and double vision.  Respiratory: Negative.  Negative for cough, sputum production and shortness of breath.   Cardiovascular: Negative.  Negative for chest pain, palpitations and leg swelling.  Gastrointestinal: Negative.  Negative for abdominal pain, constipation, diarrhea, nausea and vomiting.  Genitourinary:  Negative for dysuria, frequency and urgency.  Musculoskeletal:  Positive for joint pain (Right shoulder). Negative for back pain and falls.  Skin: Negative.  Negative for rash.  Neurological: Negative.  Negative for weakness and headaches.  Endo/Heme/Allergies: Negative.  Does not bruise/bleed easily.  Psychiatric/Behavioral: Negative.  Negative for depression. The patient is not nervous/anxious and does not have insomnia.      No Known Allergies   Past Medical History:  Diagnosis Date   Esophageal adenocarcinoma ([place]) 06/23/2020   Family history of brain cancer  Family history of colon cancer    Family history of melanoma    Family history of stomach cancer    GERD (gastroesophageal reflux disease)    History of kidney stones    Nephrolithiasis    [facility]   Pancreatitis, acute    Personal history of colonic polyps     Dr Nicolasa Ducking   Psoriasis      Past Surgical History:  Procedure Laterality Date   COLONOSCOPY     COLONOSCOPY WITH PROPOFOL N/A 03/31/2020   Procedure: COLONOSCOPY WITH PROPOFOL;  Surgeon: Jonathon Bellows, MD;  Location: [facility] ENDOSCOPY;  Service: Gastroenterology;  Laterality: N/A;   ERCP     ESOPHAGOGASTRODUODENOSCOPY (EGD) WITH PROPOFOL N/A 06/19/2020   Procedure: ESOPHAGOGASTRODUODENOSCOPY (EGD) WITH PROPOFOL;  Surgeon: Jonathon Bellows, MD;  Location: [facility] ENDOSCOPY;  Service: Gastroenterology;  Laterality: N/A;   groin surgery     as a child   PORTA CATH INSERTION N/A 06/28/2020   Procedure: PORTA CATH INSERTION;  Surgeon: Algernon Huxley, MD;  Location: [facility] CV LAB;  Service: Cardiovascular;  Laterality: N/A;   SHOULDER SURGERY      Social History   Socioeconomic History   Marital status: Married    Spouse name: Not on file   Number of children: 2   Years of education: Not on file   Highest education level: Not on file  Occupational History   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  Tobacco Use   Smoking status: Former    Types: Cigarettes    Quit date: 07/08/1988    Years since quitting: 32.9   Smokeless tobacco: Never  Vaping Use   Vaping Use: Never used  Substance and Sexual Activity   Alcohol use: No    Comment: recovering alcoholic for 13 years   Drug use: No   Sexual activity: Not Currently  Other Topics Concern   Not on file  Social History Narrative   Has living will--needs to get notarized   Daughter should be health care POA (wife with dementia)   Would accept resuscitation   Would accept tube feeds depending on the circumstance   Social Determinants of Health   Financial Resource Strain: Not on file  Food Insecurity: Not on file  Transportation Needs: Not on file  Physical Activity: Not on file  Stress: Not on file  Social Connections: Not on file  Intimate Partner Violence: Not on file    Family History  Problem Relation  Age of Onset   Stomach cancer Mother    Diabetes Brother    Hypertension Brother    Hypertension Father    Prostate cancer Father    Colon cancer Sister    Cancer Other        either cervical or ovarian, d. 31s   Brain cancer Sister      Current Outpatient Medications:    acetaminophen (TYLENOL) 325 MG tablet, Take 650 mg by mouth every 6 (six) hours as needed., Disp: , Rfl:    dexamethasone (DECADRON) 4 MG tablet, Take 2 tablets (8 mg total) by mouth daily. Start the day after chemotherapy for 2 days. Take with food., Disp: 30 tablet, Rfl: 1   lidocaine-prilocaine (EMLA) cream, Apply 1 application topically as needed. Apply small amount to port site at least 1 hour prior to it being accessed, cover with plastic wrap, Disp: 30 g, Rfl: 3   LORazepam (ATIVAN) 0.5 MG tablet, Take 1 tablet (0.5 mg total) by mouth every 6 (  six) hours as needed (Nausea or vomiting)., Disp: 30 tablet, Rfl: 0   ondansetron (ZOFRAN) 8 MG tablet, Take 1 tablet (8 mg total) by mouth 2 (two) times daily as needed for refractory nausea / vomiting. Start on day 3 after chemotherapy., Disp: 30 tablet, Rfl: 1   Oxycodone HCl 10 MG TABS, Take 1 tablet (10 mg total) by mouth every 4 (four) hours as needed., Disp: 120 tablet, Rfl: 0   pantoprazole (PROTONIX) 40 MG tablet, TAKE 1 TABLET BY MOUTH TWICE A DAY BEFORE MEALS, Disp: 60 tablet, Rfl: 11   prochlorperazine (COMPAZINE) 10 MG tablet, Take 1 tablet (10 mg total) by mouth every 6 (six) hours as needed (Nausea or vomiting)., Disp: 30 tablet, Rfl: 1   triamcinolone cream (KENALOG) 0.1 %, Apply 1 application topically 3 (three) times daily as needed., Disp: 300 each, Rfl: 1   celecoxib (CELEBREX) 200 MG capsule, Take 200 mg by mouth 2 (two) times daily as needed. (Patient not taking: Reported on 05/23/2021), Disp: , Rfl:   Current Facility-Administered Medications:    dextrose 5 % solution, , Intravenous, Once, [name], MD   fluorouracil (ADRUCIL) 5,000 mg in sodium  chloride 0.9 % 150 mL chemo infusion, 5,000 mg, Intravenous, 1 day or 1 dose, [name], MD   fluorouracil (ADRUCIL) chemo injection 850 mg, 400 mg/m2 (Treatment Plan Recorded), Intravenous, Once, [name], MD   leucovorin 850 mg in dextrose 5 % 250 mL infusion, 850 mg, Intravenous, Once, [name], MD   sodium chloride flush (NS) 0.9 % injection 10 mL, 10 mL, Intracatheter, PRN, [name], MD   trastuzumab-anns [facility]) 357 mg in sodium chloride 0.9 % 250 mL chemo infusion, 4 mg/kg (Treatment Plan Recorded), Intravenous, Once, [name], MD, Last Rate: 534 mL/hr at 06/19/21 1549, 357 mg at 06/19/21 1549  Facility-Administered Medications Ordered in Other Visits:    diphenhydrAMINE (BENADRYL) 50 MG/ML injection, , , ,    sodium chloride flush (NS) 0.9 % injection 10 mL, 10 mL, Intracatheter, PRN, [name], MD, 10 mL at 01/04/21 1149  Physical exam:  Vitals:   06/19/21 1348  BP: (!) 165/79  Pulse: 76  Temp: 98.1 F (36.7 C)  TempSrc: Tympanic  SpO2: 99%  Weight: 198 lb 12.8 oz (90.2 kg)  Height: _0  (1.803 m)   Physical Exam Constitutional:      Appearance: Normal appearance.  HENT:     Head: Normocephalic and atraumatic.  Eyes:     Pupils: Pupils are equal, round, and reactive to light.  Cardiovascular:     Rate and Rhythm: Normal rate and regular rhythm.     Heart sounds: Normal heart sounds. No murmur heard. Pulmonary:     Effort: Pulmonary effort is normal.     Breath sounds: Normal breath sounds. No wheezing.  Abdominal:     General: Bowel sounds are normal. There is no distension.     Palpations: Abdomen is soft.     Tenderness: There is no abdominal tenderness.  Musculoskeletal:        General: Normal range of motion.     Cervical back: Normal range of motion.  Skin:    General: Skin is warm and dry.     Findings: No rash.  Neurological:     Mental Status: He is alert and oriented to person, place, and time.  Psychiatric:         Judgment: Judgment normal.     CMP Latest  Ref Rng & Units 06/19/2021  Glucose 70 - 99 mg/dL 118(H)  BUN 8 - 23 mg/dL 12  Creatinine 0.61 - 1.24 mg/dL 0.74  Sodium 135 - 145 mmol/L 138  Potassium 3.5 - 5.1 mmol/L 3.5  Chloride 98 - 111 mmol/L 102  CO2 22 - 32 mmol/L 25  Calcium 8.9 - 10.3 mg/dL 9.3  Total Protein 6.5 - 8.1 g/dL 6.9  Total Bilirubin 0.3 - 1.2 mg/dL 0.6  Alkaline Phos 38 - 126 U/L 204(H)  AST 15 - 41 U/L 28  ALT 0 - 44 U/L 22   CBC Latest Ref Rng & Units 06/19/2021  WBC 4.0 - 10.5 K/uL 11.2(H)  Hemoglobin 13.0 - 17.0 g/dL 12.4(L)  Hematocrit 39.0 - 52.0 % 37.5(L)  Platelets 150 - 400 K/uL 129(L)    No images are attached to the encounter.  NM PET Image Restag (PS) Skull Base To Thigh  Addendum Date: 06/05/2021   ADDENDUM REPORT: 06/05/2021 15:21 ADDENDUM: There is a subtle CT hyperdensity within the posterior LEFT occipital lobe adjacent the falx which corresponds to the MRI finding 05/17/2019. Lesion measures 20 mm on image 13/3. No discrete hypermetabolic activity associated with the mass. Of note FDG PET does not differentiate between benign and malignant primary or metastatic lesions due to high background glucose metabolism within the brain. Electronically Signed   By: Suzy Bouchard M.D.   On: 06/05/2021 15:21   Result Date: 06/05/2021 CLINICAL DATA:  Subsequent treatment strategy for esophageal cancer. Recent RIGHT proximal humeral fracture. EXAM: NUCLEAR MEDICINE PET SKULL BASE TO THIGH TECHNIQUE: 10.6 mCi F-18 FDG was injected intravenously. Full-ring PET imaging was performed from the skull base to thigh after the radiotracer. CT data was obtained and used for attenuation correction and anatomic localization. Fasting blood glucose: 99 mg/dl COMPARISON:  PET CT 06/26/2020 FINDINGS: Mediastinal blood pool activity: SUV max 1.9 Liver activity: SUV max NA NECK: No hypermetabolic lymph nodes in the neck. Incidental CT findings: none CHEST: Interval resolution  of the metabolic activity of the GE junction. No hypermetabolic tissue remains. No hypermetabolic or enlarged mediastinal lymph nodes. Incidental CT findings: Rounded pulmonary nodule in the medial RIGHT lower lobe along the pleural surface adjacent to the spine measures 10 mm on image 133/3. This nodule is new from comparison exam but does not have metabolic activity. ABDOMEN/PELVIS: Interval resolution of the multifocal hypermetabolic lesions within the liver. No discrete hypermetabolic lesions above background above liver activity. Resolution of the previous seen hypermetabolic gastrohepatic ligament lymph nodes. Incidental CT findings: Liver has a nodular contour. Atherosclerotic calcification of the aorta. SKELETON: There is uniform increase in marrow activity throughout the pelvis, spine ribs and sternum. Findings most suggestive of a GCSF type response. Intense metabolic activity associated with fracture of the proximal RIGHT humerus. Early callus formation. Incidental CT findings: none IMPRESSION: 1. Resolution of activity through the esophagus. 2. No evidence of metastatic mediastinal lymph nodes. 3. New medial RIGHT lower lobe pulmonary nodule without metabolic activity. Recommend attention on follow-up. 4. Resolution of multifocal hypermetabolic hepatic metastasis. 5. Resolution of hypermetabolic gastrohepatic ligament lymph nodes. 6. No evidence of active esophageal carcinoma. 7. Intense marrow activity typical of GCSF type response. 8. Intense metabolic activity associated with subacute RIGHT humerus fracture. Electronically Signed: By: Suzy Bouchard M.D. On: 05/22/2021 16:09   ECHOCARDIOGRAM COMPLETE  Result Date: 05/21/2021    ECHOCARDIOGRAM REPORT   Patient Name:   Cory [name] Date of Exam: 05/21/2021 Medical Rec #:  [id number]  Height:       71.0 in Accession #:    [id number]     Weight:       199.7 lb Date of Birth:  1955-09-10     BSA:          2.107 m Patient Age:    10 years        BP:           157/75 mmHg Patient Gender: M              HR:           64 bpm. Exam Location:  [facility] Procedure: 2D Echo, Cardiac Doppler, Color Doppler and Strain Analysis Indications:     Esophageal adenocarcinoma  History:         Patient has prior history of Echocardiogram examinations, most                  recent 02/15/2021. Risk Factors:Hypertension.  Sonographer:     Sherrie Sport Referring Phys:  [id number] [facility] C RAO Diagnosing Phys: [name] MD  Sonographer Comments: Global longitudinal strain was attempted. IMPRESSIONS  1. Left ventricular ejection fraction, by estimation, is 50 to 55%. The left ventricle has low normal function. The left ventricle has no regional wall motion abnormalities. The left ventricular internal cavity size was mildly dilated. Left ventricular diastolic parameters are indeterminate.  2. Right ventricular systolic function is normal. The right ventricular size is normal. Tricuspid regurgitation signal is inadequate for assessing PA pressure.  3. Left atrial size was mildly dilated.  4. The mitral valve is normal in structure. Mild mitral valve regurgitation. No evidence of mitral stenosis.  5. The aortic valve is normal in structure. Aortic valve regurgitation is not visualized. No aortic stenosis is present.  6. The inferior vena cava is normal in size with greater than 50% respiratory variability, suggesting right atrial pressure of 3 mmHg. Comparison(s): A prior study was performed on 02/2021. Changes from prior study are noted. EF slightly decreased. FINDINGS  Left Ventricle: Left ventricular ejection fraction, by estimation, is 50 to 55%. The left ventricle has low normal function. The left ventricle has no regional wall motion abnormalities. Global longitudinal strain performed but not reported based on interpreter judgement due to suboptimal tracking. The left ventricular internal cavity size was mildly dilated. There is no left ventricular hypertrophy. Left ventricular  diastolic parameters are indeterminate. Right Ventricle: The right ventricular size is normal. No increase in right ventricular wall thickness. Right ventricular systolic function is normal. Tricuspid regurgitation signal is inadequate for assessing PA pressure. Left Atrium: Left atrial size was mildly dilated. Right Atrium: Right atrial size was normal in size. Pericardium: There is no evidence of pericardial effusion. Mitral Valve: The mitral valve is normal in structure. Mild mitral valve regurgitation. No evidence of mitral valve stenosis. MV peak gradient, 4.0 mmHg. The mean mitral valve gradient is 2.0 mmHg. Tricuspid Valve: The tricuspid valve is normal in structure. Tricuspid valve regurgitation is trivial. No evidence of tricuspid stenosis. Aortic Valve: The aortic valve is normal in structure. Aortic valve regurgitation is not visualized. No aortic stenosis is present. Aortic valve mean gradient measures 3.0 mmHg. Aortic valve peak gradient measures 6.0 mmHg. Aortic valve area, by VTI measures 1.95 cm. Pulmonic Valve: The pulmonic valve was normal in structure. Pulmonic valve regurgitation is not visualized. No evidence of pulmonic stenosis. Aorta: The aortic root is normal in size and structure. Venous: The inferior vena cava is normal in size  with greater than 50% respiratory variability, suggesting right atrial pressure of 3 mmHg. IAS/Shunts: No atrial level shunt detected by color flow Doppler.  LEFT VENTRICLE PLAX 2D LVIDd:         5.60 cm      Diastology LVIDs:         4.15 cm      LV e' medial:    6.42 cm/s LV PW:         1.20 cm      LV E/e' medial:  15.7 LV IVS:        0.90 cm      LV e' lateral:   7.83 cm/s LVOT diam:     2.10 cm      LV E/e' lateral: 12.9 LV SV:         45 LV SV Index:   21 LVOT Area:     3.46 cm                              3D Volume EF: LV Volumes (MOD)            3D EF:        45 % LV vol d, MOD A2C: 138.0 ml LV EDV:       200 ml LV vol d, MOD A4C: 158.0 ml LV ESV:        110 ml LV vol s, MOD A2C: 72.3 ml  LV SV:        90 ml LV vol s, MOD A4C: 86.7 ml LV SV MOD A2C:     65.7 ml LV SV MOD A4C:     158.0 ml LV SV MOD BP:      66.9 ml RIGHT VENTRICLE RV Basal diam:  4.30 cm RV S prime:     14.70 cm/s TAPSE (M-mode): 3.9 cm LEFT ATRIUM           Index        RIGHT ATRIUM           Index LA diam:      3.20 cm 1.52 cm/m   RA Area:     23.10 cm LA Vol (A2C): 73.0 ml 34.64 ml/m  RA Volume:   67.30 ml  31.94 ml/m LA Vol (A4C): 22.8 ml 10.82 ml/m  AORTIC VALVE                    PULMONIC VALVE AV Area (Vmax):    1.82 cm     PV Vmax:        1.08 m/s AV Area (Vmean):   1.80 cm     PV Vmean:       79.650 cm/s AV Area (VTI):     1.95 cm     PV VTI:         0.234 m AV Vmax:           122.00 cm/s  PV Peak grad:   4.7 mmHg AV Vmean:          83.000 cm/s  PV Mean grad:   3.0 mmHg AV VTI:            0.231 m      RVOT Peak grad: 9 mmHg AV Peak Grad:      6.0 mmHg AV Mean Grad:      3.0 mmHg LVOT Vmax:         64.00 cm/s LVOT Vmean:  43.200 cm/s LVOT VTI:          0.130 m LVOT/AV VTI ratio: 0.56  AORTA Ao Root diam: 3.55 cm MITRAL VALVE                TRICUSPID VALVE MV Area (PHT): 3.24 cm     TR Peak grad:   25.8 mmHg MV Area VTI:   1.40 cm     TR Vmax:        254.00 cm/s MV Peak grad:  4.0 mmHg MV Mean grad:  2.0 mmHg     SHUNTS MV Vmax:       1.00 m/s     Systemic VTI:  0.13 m MV Vmean:      66.5 cm/s    Systemic Diam: 2.10 cm MV Decel Time: 234 msec     Pulmonic VTI:  0.265 m MV E velocity: 101.00 cm/s MV A velocity: 72.40 cm/s MV E/A ratio:  1.40 [name] MD Electronically signed by [name] MD Signature Date/Time: 05/21/2021/11:09:37 AM    Final      Assessment and plan- Patient is a 64 y.o. male  with history of adenocarcinoma of esophagus with liver metastases.  He is here for assessment prior to cycle 25 of 5-FU trastuzumab and will be adding Keytruda.  Recently had a PET scan showed resolution of hypermetabolism in the liver as well as gastrohepatic ligament  lymph nodes consistent with treatment response.  Previously noted right lower lobe lung nodule did not reveal any metabolic activity.  Had MRI of his brain due to intraoperative complaint of intermittent floater which showed dural based mass associate with dural thickening and mild left occipital edema.  Differentials included metastasis for some angioma. Dr. [name] discussed with Dr. Lacinda Axon from [facility] and agreed to manage mass conservatively with repeat MRI of his brain sometime in January 2023.  At that point he will be referred to neurosurgery if consistently increasing.  Appears to be tolerating 5-FU and trastuzumab well.  Had echocardiogram which showed an EF of 50 to 55% which is mildly reduced as compared to prior echo.  Given decline Dr. [name] will repeat in 5 to 6 weeks.  Reviewed counts from today which are acceptable for treatment.  He will return to clinic on day 3 for pump disconnect and he will receive Udenyca.  Plan is to add Keytruda with today's cycle.  Mild leukocytosis- Secondary to Udenyca injection following treatment.  No signs of infection.  Thrombocytopenia- Likely secondary to chemo.  Right shoulder pain- Unrelated to malignancy and from his shoulder injury.   I spent 25 minutes dedicated to the care of this patient (face-to-face and non-face-to-face) on the date of the encounter to include what is described in the assessment and plan.  Visit Diagnosis 1. Esophageal adenocarcinoma ([place])    Faythe Casa, NP 06/19/2021 4:09 PM

## 2021-06-19 NOTE — Progress Notes (Signed)
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  Patient receiving 5-FU, trastuzumab and keytruda.  Met with patient during infusion.  Patient reports good appetite. Eating at least 3 meals per day and drinking boost shakes. Denies any trouble swallowing foods.     Medications: reviewed  Labs: reviewed  Anthropometrics:   Weight 198 lb 12.8 oz today  201 lb on 9/20 194 lb 3.2 oz on 8/9 188 lb on 8/2 197 lb on 7/12 195 lb on 6/28 191 lb on 6/14 194 lb on 5/31 197 lb on 5/3  NUTRITION DIAGNOSIS: Inadequate oral intake improved    INTERVENTION:  Continue well balanced diet including good sources of protein    MONITORING, EVALUATION, GOAL: weight trends, intake   NEXT VISIT: as needed  Cory [name] B. Zenia Resides, [place], [place] Registered Dietitian [phone] (mobile)

## 2021-06-19 NOTE — Patient Instructions (Signed)
[facility] AT [place]  Discharge Instructions: Thank you for choosing [facility] to provide your oncology and hematology care.  If you have a lab appointment with the [facility], please go directly to the [facility] and check in at the registration area.  Wear comfortable clothing and clothing appropriate for easy access to any Portacath or PICC line.   We strive to give you quality time with your provider. You may need to reschedule your appointment if you arrive late (15 or more minutes).  Arriving late affects you and other patients whose appointments are after yours.  Also, if you miss three or more appointments without notifying the office, you may be dismissed from the clinic at the providers discretion.      For prescription refill requests, have your pharmacy contact our office and allow 72 hours for refills to be completed.    Today you received the following chemotherapy and/or immunotherapy agents - pembrolizumab, trastuzumab, fluorouracil      To help prevent nausea and vomiting after your treatment, we encourage you to take your nausea medication as directed.  BELOW ARE SYMPTOMS THAT SHOULD BE REPORTED IMMEDIATELY: *FEVER GREATER THAN 100.4 F (38 C) OR HIGHER *CHILLS OR SWEATING *NAUSEA AND VOMITING THAT IS NOT CONTROLLED WITH YOUR NAUSEA MEDICATION *UNUSUAL SHORTNESS OF BREATH *UNUSUAL BRUISING OR BLEEDING *URINARY PROBLEMS (pain or burning when urinating, or frequent urination) *BOWEL PROBLEMS (unusual diarrhea, constipation, pain near the anus) TENDERNESS IN MOUTH AND THROAT WITH OR WITHOUT PRESENCE OF ULCERS (sore throat, sores in mouth, or a toothache) UNUSUAL RASH, SWELLING OR PAIN  UNUSUAL VAGINAL DISCHARGE OR ITCHING   Items with * indicate a potential emergency and should be followed up as soon as possible or go to the Emergency Department if any problems should occur.  Please show the CHEMOTHERAPY ALERT CARD or  IMMUNOTHERAPY ALERT CARD at check-in to the Emergency Department and triage nurse.  Should you have questions after your visit or need to cancel or reschedule your appointment, please contact [facility] [place] AT [place]  [phone] and follow the prompts.  Office hours are 8:00 a.m. to 4:30 p.m. Monday - Friday. Please note that voicemails left after 4:00 p.m. may not be returned until the following business day.  We are closed weekends and major holidays. You have access to a nurse at all times for urgent questions. Please call the main number to the clinic [phone] and follow the prompts.  For any non-urgent questions, you may also contact your provider using MyChart. We now offer e-Visits for anyone 61 and older to request care online for non-urgent symptoms. For details visit [email or web address].   Also download the MyChart app! Go to the app store, search "MyChart", open the app, select [facility], and log in with your MyChart username and password.  Due to Covid, a mask is required upon entering the hospital/clinic. If you do not have a mask, one will be given to you upon arrival. For doctor visits, patients may have 1 support person aged 81 or older with them. For treatment visits, patients cannot have anyone with them due to current Covid guidelines and our immunocompromised population.   Pembrolizumab injection What is this medication? PEMBROLIZUMAB (pem broe liz ue mab) is a monoclonal antibody. It is used to treat certain types of cancer. This medicine may be used for other purposes; ask your health care provider or pharmacist if you have questions. COMMON BRAND NAME(S): Keytruda What should I tell  my care team before I take this medication? They need to know if you have any of these conditions: autoimmune diseases like Crohn's disease, ulcerative colitis, or lupus have had or planning to have an allogeneic stem cell transplant (uses someone else's stem  cells) history of organ transplant history of chest radiation nervous system problems like myasthenia gravis or Guillain-Barre syndrome an unusual or allergic reaction to pembrolizumab, other medicines, foods, dyes, or preservatives pregnant or trying to get pregnant breast-feeding How should I use this medication? This medicine is for infusion into a vein. It is given by a health care professional in a hospital or clinic setting. A special MedGuide will be given to you before each treatment. Be sure to read this information carefully each time. Talk to your pediatrician regarding the use of this medicine in children. While this drug may be prescribed for children as young as 6 months for selected conditions, precautions do apply. Overdosage: If you think you have taken too much of this medicine contact a poison control center or emergency room at once. NOTE: This medicine is only for you. Do not share this medicine with others. What if I miss a dose? It is important not to miss your dose. Call your doctor or health care professional if you are unable to keep an appointment. What may interact with this medication? Interactions have not been studied. This list may not describe all possible interactions. Give your health care provider a list of all the medicines, herbs, non-prescription drugs, or dietary supplements you use. Also tell them if you smoke, drink alcohol, or use illegal drugs. Some items may interact with your medicine. What should I watch for while using this medication? Your condition will be monitored carefully while you are receiving this medicine. You may need blood work done while you are taking this medicine. Do not become pregnant while taking this medicine or for 4 months after stopping it. Women should inform their doctor if they wish to become pregnant or think they might be pregnant. There is a potential for serious side effects to an unborn child. Talk to your health care  professional or pharmacist for more information. Do not breast-feed an infant while taking this medicine or for 4 months after the last dose. What side effects may I notice from receiving this medication? Side effects that you should report to your doctor or health care professional as soon as possible: allergic reactions like skin rash, itching or hives, swelling of the face, lips, or tongue bloody or black, tarry breathing problems changes in vision chest pain chills confusion constipation cough diarrhea dizziness or feeling faint or lightheaded fast or irregular heartbeat fever flushing joint pain low blood counts - this medicine may decrease the number of white blood cells, red blood cells and platelets. You may be at increased risk for infections and bleeding. muscle pain muscle weakness pain, tingling, numbness in the hands or feet persistent headache redness, blistering, peeling or loosening of the skin, including inside the mouth signs and symptoms of high blood sugar such as dizziness; dry mouth; dry skin; fruity breath; nausea; stomach pain; increased hunger or thirst; increased urination signs and symptoms of kidney injury like trouble passing urine or change in the amount of urine signs and symptoms of liver injury like dark urine, light-colored stools, loss of appetite, nausea, right upper belly pain, yellowing of the eyes or skin sweating swollen lymph nodes weight loss Side effects that usually do not require medical attention (report to  your doctor or health care professional if they continue or are bothersome): decreased appetite hair loss tiredness This list may not describe all possible side effects. Call your doctor for medical advice about side effects. You may report side effects to FDA at 1-800-FDA-1088. Where should I keep my medication? This drug is given in a hospital or clinic and will not be stored at home. NOTE: This sheet is a summary. It may not  cover all possible information. If you have questions about this medicine, talk to your doctor, pharmacist, or health care provider.  2022 Elsevier/Gold Standard (2021-03-13 00:00:00)  Trastuzumab injection for infusion What is this medication? TRASTUZUMAB (tras TOO zoo mab) is a monoclonal antibody. It is used to treat breast cancer and stomach cancer. This medicine may be used for other purposes; ask your health care provider or pharmacist if you have questions. COMMON BRAND NAME(S): Herceptin, Janae Bridgeman, Ontruzant, Trazimera What should I tell my care team before I take this medication? They need to know if you have any of these conditions: heart disease heart failure lung or breathing disease, like asthma an unusual or allergic reaction to trastuzumab, benzyl alcohol, or other medications, foods, dyes, or preservatives pregnant or trying to get pregnant breast-feeding How should I use this medication? This drug is given as an infusion into a vein. It is administered in a hospital or clinic by a specially trained health care professional. Talk to your pediatrician regarding the use of this medicine in children. This medicine is not approved for use in children. Overdosage: If you think you have taken too much of this medicine contact a poison control center or emergency room at once. NOTE: This medicine is only for you. Do not share this medicine with others. What if I miss a dose? It is important not to miss a dose. Call your doctor or health care professional if you are unable to keep an appointment. What may interact with this medication? This medicine may interact with the following medications: certain types of chemotherapy, such as daunorubicin, doxorubicin, epirubicin, and idarubicin This list may not describe all possible interactions. Give your health care provider a list of all the medicines, herbs, non-prescription drugs, or dietary supplements you use. Also  tell them if you smoke, drink alcohol, or use illegal drugs. Some items may interact with your medicine. What should I watch for while using this medication? Visit your doctor for checks on your progress. Report any side effects. Continue your course of treatment even though you feel ill unless your doctor tells you to stop. Call your doctor or health care professional for advice if you get a fever, chills or sore throat, or other symptoms of a cold or flu. Do not treat yourself. Try to avoid being around people who are sick. You may experience fever, chills and shaking during your first infusion. These effects are usually mild and can be treated with other medicines. Report any side effects during the infusion to your health care professional. Fever and chills usually do not happen with later infusions. Do not become pregnant while taking this medicine or for 7 months after stopping it. Women should inform their doctor if they wish to become pregnant or think they might be pregnant. Women of child-bearing potential will need to have a negative pregnancy test before starting this medicine. There is a potential for serious side effects to an unborn child. Talk to your health care professional or pharmacist for more information. Do not breast-feed an  infant while taking this medicine or for 7 months after stopping it. Women must use effective birth control with this medicine. What side effects may I notice from receiving this medication? Side effects that you should report to your doctor or health care professional as soon as possible: allergic reactions like skin rash, itching or hives, swelling of the face, lips, or tongue chest pain or palpitations cough dizziness feeling faint or lightheaded, falls fever general ill feeling or flu-like symptoms signs of worsening heart failure like breathing problems; swelling in your legs and feet unusually weak or tired Side effects that usually do not require  medical attention (report to your doctor or health care professional if they continue or are bothersome): bone pain changes in taste diarrhea joint pain nausea/vomiting weight loss This list may not describe all possible side effects. Call your doctor for medical advice about side effects. You may report side effects to FDA at 1-800-FDA-1088. Where should I keep my medication? This drug is given in a hospital or clinic and will not be stored at home. NOTE: This sheet is a summary. It may not cover all possible information. If you have questions about this medicine, talk to your doctor, pharmacist, or health care provider.  2022 Elsevier/Gold Standard (2016-07-09 00:00:00)  Fluorouracil, 5-FU injection What is this medication? FLUOROURACIL, 5-FU (flure oh YOOR a sil) is a chemotherapy drug. It slows the growth of cancer cells. This medicine is used to treat many types of cancer like breast cancer, colon or rectal cancer, pancreatic cancer, and stomach cancer. This medicine may be used for other purposes; ask your health care provider or pharmacist if you have questions. COMMON BRAND NAME(S): Adrucil What should I tell my care team before I take this medication? They need to know if you have any of these conditions: blood disorders dihydropyrimidine dehydrogenase (DPD) deficiency infection (especially a virus infection such as chickenpox, cold sores, or herpes) kidney disease liver disease malnourished, poor nutrition recent or ongoing radiation therapy an unusual or allergic reaction to fluorouracil, other chemotherapy, other medicines, foods, dyes, or preservatives pregnant or trying to get pregnant breast-feeding How should I use this medication? This drug is given as an infusion or injection into a vein. It is administered in a hospital or clinic by a specially trained health care professional. Talk to your pediatrician regarding the use of this medicine in children. Special care  may be needed. Overdosage: If you think you have taken too much of this medicine contact a poison control center or emergency room at once. NOTE: This medicine is only for you. Do not share this medicine with others. What if I miss a dose? It is important not to miss your dose. Call your doctor or health care professional if you are unable to keep an appointment. What may interact with this medication? Do not take this medicine with any of the following medications: live virus vaccines This medicine may also interact with the following medications: medicines that treat or prevent blood clots like warfarin, enoxaparin, and dalteparin This list may not describe all possible interactions. Give your health care provider a list of all the medicines, herbs, non-prescription drugs, or dietary supplements you use. Also tell them if you smoke, drink alcohol, or use illegal drugs. Some items may interact with your medicine. What should I watch for while using this medication? Visit your doctor for checks on your progress. This drug may make you feel generally unwell. This is not uncommon, as chemotherapy can affect  healthy cells as well as cancer cells. Report any side effects. Continue your course of treatment even though you feel ill unless your doctor tells you to stop. In some cases, you may be given additional medicines to help with side effects. Follow all directions for their use. Call your doctor or health care professional for advice if you get a fever, chills or sore throat, or other symptoms of a cold or flu. Do not treat yourself. This drug decreases your body's ability to fight infections. Try to avoid being around people who are sick. This medicine may increase your risk to bruise or bleed. Call your doctor or health care professional if you notice any unusual bleeding. Be careful brushing and flossing your teeth or using a toothpick because you may get an infection or bleed more easily. If you  have any dental work done, tell your dentist you are receiving this medicine. Avoid taking products that contain aspirin, acetaminophen, ibuprofen, naproxen, or ketoprofen unless instructed by your doctor. These medicines may hide a fever. Do not become pregnant while taking this medicine. Women should inform their doctor if they wish to become pregnant or think they might be pregnant. There is a potential for serious side effects to an unborn child. Talk to your health care professional or pharmacist for more information. Do not breast-feed an infant while taking this medicine. Men should inform their doctor if they wish to father a child. This medicine may lower sperm counts. Do not treat diarrhea with over the counter products. Contact your doctor if you have diarrhea that lasts more than 2 days or if it is severe and watery. This medicine can make you more sensitive to the sun. Keep out of the sun. If you cannot avoid being in the sun, wear protective clothing and use sunscreen. Do not use sun lamps or tanning beds/booths. What side effects may I notice from receiving this medication? Side effects that you should report to your doctor or health care professional as soon as possible: allergic reactions like skin rash, itching or hives, swelling of the face, lips, or tongue low blood counts - this medicine may decrease the number of white blood cells, red blood cells and platelets. You may be at increased risk for infections and bleeding. signs of infection - fever or chills, cough, sore throat, pain or difficulty passing urine signs of decreased platelets or bleeding - bruising, pinpoint red spots on the skin, black, tarry stools, blood in the urine signs of decreased red blood cells - unusually weak or tired, fainting spells, lightheadedness breathing problems changes in vision chest pain mouth sores nausea and vomiting pain, swelling, redness at site where injected pain, tingling, numbness in  the hands or feet redness, swelling, or sores on hands or feet stomach pain unusual bleeding Side effects that usually do not require medical attention (report to your doctor or health care professional if they continue or are bothersome): changes in finger or toe nails diarrhea dry or itchy skin hair loss headache loss of appetite sensitivity of eyes to the light stomach upset unusually teary eyes This list may not describe all possible side effects. Call your doctor for medical advice about side effects. You may report side effects to FDA at 1-800-FDA-1088. Where should I keep my medication? This drug is given in a hospital or clinic and will not be stored at home. NOTE: This sheet is a summary. It may not cover all possible information. If you have questions about this medicine,  talk to your doctor, pharmacist, or health care provider.  2022 Elsevier/Gold Standard (2021-03-13 00:00:00)

## 2021-06-20 ENCOUNTER — Ambulatory Visit: Admission: RE | Admit: 2021-06-20 | Payer: Medicare Other | Source: Ambulatory Visit

## 2021-06-21 ENCOUNTER — Inpatient Hospital Stay: Payer: Medicare Other

## 2021-06-21 ENCOUNTER — Other Ambulatory Visit: Payer: Self-pay

## 2021-06-21 DIAGNOSIS — C159 Malignant neoplasm of esophagus, unspecified: Secondary | ICD-10-CM

## 2021-06-21 DIAGNOSIS — Z5112 Encounter for antineoplastic immunotherapy: Secondary | ICD-10-CM | POA: Diagnosis not present

## 2021-06-21 MED ORDER — SODIUM CHLORIDE 0.9% FLUSH
10.0000 mL | INTRAVENOUS | Status: DC | PRN
  Administered 2021-06-21: 10 mL
  Filled 2021-06-21: qty 10

## 2021-06-21 MED ORDER — HEPARIN SOD (PORK) LOCK FLUSH 100 UNIT/ML IV SOLN
INTRAVENOUS | Status: AC
  Filled 2021-06-21: qty 5

## 2021-06-21 MED ORDER — HEPARIN SOD (PORK) LOCK FLUSH 100 UNIT/ML IV SOLN
500.0000 [IU] | Freq: Once | INTRAVENOUS | Status: AC | PRN
  Administered 2021-06-21: 500 [IU]
  Filled 2021-06-21: qty 5

## 2021-06-21 MED ORDER — PEGFILGRASTIM-CBQV 6 MG/0.6ML ~~LOC~~ SOSY
6.0000 mg | PREFILLED_SYRINGE | Freq: Once | SUBCUTANEOUS | Status: AC
  Administered 2021-06-21: 6 mg via SUBCUTANEOUS
  Filled 2021-06-21: qty 0.6

## 2021-06-22 ENCOUNTER — Ambulatory Visit
Admission: RE | Admit: 2021-06-22 | Discharge: 2021-06-22 | Disposition: A | Discharge status: Home / Self Care | Payer: Medicare Other | Source: Ambulatory Visit | Attending: Radiation Oncology | Admitting: Radiation Oncology

## 2021-06-22 ENCOUNTER — Other Ambulatory Visit: Payer: Self-pay | Admitting: *Deleted

## 2021-06-22 DIAGNOSIS — C787 Secondary malignant neoplasm of liver and intrahepatic bile duct: Secondary | ICD-10-CM | POA: Insufficient documentation

## 2021-06-22 DIAGNOSIS — C7931 Secondary malignant neoplasm of brain: Secondary | ICD-10-CM | POA: Insufficient documentation

## 2021-06-22 DIAGNOSIS — C160 Malignant neoplasm of cardia: Secondary | ICD-10-CM | POA: Insufficient documentation

## 2021-06-22 MED ORDER — DEXAMETHASONE 2 MG PO TABS: 4.0000 mg | ORAL_TABLET | Freq: Two times a day (BID) | ORAL | 0 refills | Status: DC

## 2021-06-22 MED ORDER — GADOBUTROL 1 MMOL/ML IV SOLN
9.0000 mL | Freq: Once | INTRAVENOUS | Status: AC | PRN
  Administered 2021-06-22: 10 mL via INTRAVENOUS

## 2021-06-25 ENCOUNTER — Other Ambulatory Visit: Payer: Self-pay

## 2021-06-25 DIAGNOSIS — C7931 Secondary malignant neoplasm of brain: Secondary | ICD-10-CM | POA: Diagnosis not present

## 2021-06-26 DIAGNOSIS — C7931 Secondary malignant neoplasm of brain: Secondary | ICD-10-CM | POA: Diagnosis not present

## 2021-06-28 ENCOUNTER — Ambulatory Visit
Admission: RE | Admit: 2021-06-28 | Discharge: 2021-06-28 | Disposition: A | Discharge status: Home / Self Care | Payer: Medicare Other | Source: Ambulatory Visit | Attending: Radiation Oncology | Admitting: Radiation Oncology

## 2021-06-28 DIAGNOSIS — C7931 Secondary malignant neoplasm of brain: Secondary | ICD-10-CM | POA: Diagnosis not present

## 2021-07-04 ENCOUNTER — Other Ambulatory Visit: Payer: Self-pay | Admitting: Oncology

## 2021-07-04 ENCOUNTER — Inpatient Hospital Stay (HOSPITAL_BASED_OUTPATIENT_CLINIC_OR_DEPARTMENT_OTHER): Payer: Medicare Other | Admitting: Oncology

## 2021-07-04 ENCOUNTER — Telehealth: Payer: Self-pay | Admitting: *Deleted

## 2021-07-04 ENCOUNTER — Encounter: Payer: Self-pay | Admitting: Oncology

## 2021-07-04 ENCOUNTER — Other Ambulatory Visit: Payer: Self-pay | Admitting: *Deleted

## 2021-07-04 ENCOUNTER — Inpatient Hospital Stay: Payer: Medicare Other

## 2021-07-04 ENCOUNTER — Other Ambulatory Visit: Payer: Self-pay

## 2021-07-04 VITALS — BP 142/78 | HR 71 | Temp 97.6°F | Resp 16 | Wt 201.9 lb

## 2021-07-04 DIAGNOSIS — I10 Essential (primary) hypertension: Secondary | ICD-10-CM

## 2021-07-04 DIAGNOSIS — Z5111 Encounter for antineoplastic chemotherapy: Secondary | ICD-10-CM

## 2021-07-04 DIAGNOSIS — C160 Malignant neoplasm of cardia: Secondary | ICD-10-CM

## 2021-07-04 DIAGNOSIS — C159 Malignant neoplasm of esophagus, unspecified: Secondary | ICD-10-CM

## 2021-07-04 DIAGNOSIS — C799 Secondary malignant neoplasm of unspecified site: Secondary | ICD-10-CM | POA: Diagnosis not present

## 2021-07-04 DIAGNOSIS — Z5112 Encounter for antineoplastic immunotherapy: Secondary | ICD-10-CM

## 2021-07-04 LAB — CBC WITH DIFFERENTIAL/PLATELET
Abs Immature Granulocytes: 0.56 10*3/uL — ABNORMAL HIGH (ref 0.00–0.07)
Basophils Absolute: 0 10*3/uL (ref 0.0–0.1)
Basophils Relative: 0 %
Eosinophils Absolute: 0.1 10*3/uL (ref 0.0–0.5)
Eosinophils Relative: 1 %
HCT: 36.8 % — ABNORMAL LOW (ref 39.0–52.0)
Hemoglobin: 12.1 g/dL — ABNORMAL LOW (ref 13.0–17.0)
Immature Granulocytes: 4 %
Lymphocytes Relative: 7 %
Lymphs Abs: 1.1 10*3/uL (ref 0.7–4.0)
MCH: 29.9 pg (ref 26.0–34.0)
MCHC: 32.9 g/dL (ref 30.0–36.0)
MCV: 90.9 fL (ref 80.0–100.0)
Monocytes Absolute: 1 10*3/uL (ref 0.1–1.0)
Monocytes Relative: 7 %
Neutro Abs: 11.9 10*3/uL — ABNORMAL HIGH (ref 1.7–7.7)
Neutrophils Relative %: 81 %
Platelets: 154 10*3/uL (ref 150–400)
RBC: 4.05 MIL/uL — ABNORMAL LOW (ref 4.22–5.81)
RDW: 16.5 % — ABNORMAL HIGH (ref 11.5–15.5)
WBC: 14.7 10*3/uL — ABNORMAL HIGH (ref 4.0–10.5)
nRBC: 0 % (ref 0.0–0.2)

## 2021-07-04 LAB — COMPREHENSIVE METABOLIC PANEL
ALT: 22 U/L (ref 0–44)
AST: 24 U/L (ref 15–41)
Albumin: 3.7 g/dL (ref 3.5–5.0)
Alkaline Phosphatase: 201 U/L — ABNORMAL HIGH (ref 38–126)
Anion gap: 10 (ref 5–15)
BUN: 19 mg/dL (ref 8–23)
CO2: 24 mmol/L (ref 22–32)
Calcium: 9.1 mg/dL (ref 8.9–10.3)
Chloride: 103 mmol/L (ref 98–111)
Creatinine, Ser: 0.72 mg/dL (ref 0.61–1.24)
GFR, Estimated: >60 mL/min (ref >60)
Glucose, Bld: 188 mg/dL — ABNORMAL HIGH (ref 70–99)
Potassium: 3.6 mmol/L (ref 3.5–5.1)
Sodium: 137 mmol/L (ref 135–145)
Total Bilirubin: 0.5 mg/dL (ref 0.3–1.2)
Total Protein: 6.4 g/dL — ABNORMAL LOW (ref 6.5–8.1)

## 2021-07-04 MED ORDER — LEUCOVORIN CALCIUM INJECTION 350 MG
850.0000 mg | Freq: Once | INTRAVENOUS | Status: AC
  Administered 2021-07-04: 13:00:00 850 mg via INTRAVENOUS
  Filled 2021-07-04: qty 42.5

## 2021-07-04 MED ORDER — ACETAMINOPHEN 325 MG PO TABS
650.0000 mg | ORAL_TABLET | Freq: Once | ORAL | Status: AC
  Administered 2021-07-04: 12:00:00 650 mg via ORAL
  Filled 2021-07-04: qty 2

## 2021-07-04 MED ORDER — DEXAMETHASONE 2 MG PO TABS: 2.0000 mg | ORAL_TABLET | Freq: Every day | ORAL | 0 refills | Status: DC

## 2021-07-04 MED ORDER — SODIUM CHLORIDE 0.9 % IV SOLN
10.0000 mg | Freq: Once | INTRAVENOUS | Status: AC
  Administered 2021-07-04: 12:00:00 10 mg via INTRAVENOUS
  Filled 2021-07-04: qty 1

## 2021-07-04 MED ORDER — DEXTROSE 5 % IV SOLN
Freq: Once | INTRAVENOUS | Status: DC
  Filled 2021-07-04: qty 250

## 2021-07-04 MED ORDER — DIPHENHYDRAMINE HCL 50 MG/ML IJ SOLN
50.0000 mg | Freq: Once | INTRAMUSCULAR | Status: AC
  Administered 2021-07-04: 12:00:00 50 mg via INTRAVENOUS
  Filled 2021-07-04: qty 1

## 2021-07-04 MED ORDER — FLUOROURACIL CHEMO INJECTION 2.5 GM/50ML
400.0000 mg/m2 | Freq: Once | INTRAVENOUS | Status: AC
  Administered 2021-07-04: 14:00:00 850 mg via INTRAVENOUS
  Filled 2021-07-04: qty 17

## 2021-07-04 MED ORDER — SODIUM CHLORIDE 0.9 % IV SOLN
5000.0000 mg | INTRAVENOUS | Status: DC
  Administered 2021-07-04: 14:00:00 5000 mg via INTRAVENOUS
  Filled 2021-07-04: qty 100

## 2021-07-04 MED ORDER — FENTANYL 12 MCG/HR TD PT72: 1.0000 | MEDICATED_PATCH | TRANSDERMAL | 0 refills | Status: DC

## 2021-07-04 MED ORDER — SODIUM CHLORIDE 0.9% FLUSH
10.0000 mL | Freq: Once | INTRAVENOUS | Status: AC
  Administered 2021-07-04: 11:00:00 10 mL via INTRAVENOUS
  Filled 2021-07-04: qty 10

## 2021-07-04 MED ORDER — TRASTUZUMAB-ANNS CHEMO 150 MG IV SOLR
4.0000 mg/kg | Freq: Once | INTRAVENOUS | Status: AC
  Administered 2021-07-04: 13:00:00 357 mg via INTRAVENOUS
  Filled 2021-07-04: qty 17

## 2021-07-04 MED ORDER — PALONOSETRON HCL INJECTION 0.25 MG/5ML
0.2500 mg | Freq: Once | INTRAVENOUS | Status: AC
  Administered 2021-07-04: 12:00:00 0.25 mg via INTRAVENOUS
  Filled 2021-07-04: qty 5

## 2021-07-04 MED ORDER — SODIUM CHLORIDE 0.9% FLUSH
10.0000 mL | INTRAVENOUS | Status: DC | PRN
  Filled 2021-07-04: qty 10

## 2021-07-04 MED ORDER — SODIUM CHLORIDE 0.9 % IV SOLN
Freq: Once | INTRAVENOUS | Status: AC
  Filled 2021-07-04: qty 250

## 2021-07-04 MED ORDER — HEPARIN SOD (PORK) LOCK FLUSH 100 UNIT/ML IV SOLN
500.0000 [IU] | Freq: Once | INTRAVENOUS | Status: DC
  Filled 2021-07-04: qty 5

## 2021-07-04 NOTE — Progress Notes (Signed)
Pt in for follow up, reports shoulder "popped" and is unable to move right arm and shoulder.  States injury occurred in August. Pt also reports blurred vision, had eyes checked a month ok and states doctor said eyes had not changed regarding prescription.

## 2021-07-04 NOTE — Patient Instructions (Signed)
[facility] AT [place]  Discharge Instructions: Thank you for choosing [facility] to provide your oncology and hematology care.  If you have a lab appointment with the [facility], please go directly to the [facility] and check in at the registration area.  Wear comfortable clothing and clothing appropriate for easy access to any Portacath or PICC line.   We strive to give you quality time with your provider. You may need to reschedule your appointment if you arrive late (15 or more minutes).  Arriving late affects you and other patients whose appointments are after yours.  Also, if you miss three or more appointments without notifying the office, you may be dismissed from the clinic at the providers discretion.      For prescription refill requests, have your pharmacy contact our office and allow 72 hours for refills to be completed.    Today you received the following chemotherapy and/or immunotherapy agents - trastuzumab, fluorouracil      To help prevent nausea and vomiting after your treatment, we encourage you to take your nausea medication as directed.  BELOW ARE SYMPTOMS THAT SHOULD BE REPORTED IMMEDIATELY: *FEVER GREATER THAN 100.4 F (38 C) OR HIGHER *CHILLS OR SWEATING *NAUSEA AND VOMITING THAT IS NOT CONTROLLED WITH YOUR NAUSEA MEDICATION *UNUSUAL SHORTNESS OF BREATH *UNUSUAL BRUISING OR BLEEDING *URINARY PROBLEMS (pain or burning when urinating, or frequent urination) *BOWEL PROBLEMS (unusual diarrhea, constipation, pain near the anus) TENDERNESS IN MOUTH AND THROAT WITH OR WITHOUT PRESENCE OF ULCERS (sore throat, sores in mouth, or a toothache) UNUSUAL RASH, SWELLING OR PAIN  UNUSUAL VAGINAL DISCHARGE OR ITCHING   Items with * indicate a potential emergency and should be followed up as soon as possible or go to the Emergency Department if any problems should occur.  Please show the CHEMOTHERAPY ALERT CARD or IMMUNOTHERAPY ALERT  CARD at check-in to the Emergency Department and triage nurse.  Should you have questions after your visit or need to cancel or reschedule your appointment, please contact [facility] [place] [facility] [place]  [phone] and follow the prompts.  Office hours are 8:00 a.m. to 4:30 p.m. Monday - Friday. Please note that voicemails left after 4:00 p.m. may not be returned until the following business day.  We are closed weekends and major holidays. You have access to a nurse at all times for urgent questions. Please call the main number to the clinic [phone] and follow the prompts.  For any non-urgent questions, you may also contact your provider using MyChart. We now offer e-Visits for anyone 89 and older to request care online for non-urgent symptoms. For details visit [email or web address].   Also download the MyChart app! Go to the app store, search "MyChart", open the app, select [facility], and log in with your MyChart username and password.  Due to Covid, a mask is required upon entering the hospital/clinic. If you do not have a mask, one will be given to you upon arrival. For doctor visits, patients may have 1 support person aged 60 or older with them. For treatment visits, patients cannot have anyone with them due to current Covid guidelines and our immunocompromised population.   Trastuzumab injection for infusion What is this medication? TRASTUZUMAB (tras TOO zoo mab) is a monoclonal antibody. It is used to treat breast cancer and stomach cancer. This medicine may be used for other purposes; ask your health care provider or pharmacist if you have questions. COMMON BRAND NAME(S): Herceptin, Belenda Cruise, Ogivri,  Chalmers Guest What should I tell my care team before I take this medication? They need to know if you have any of these conditions: heart disease heart failure lung or breathing disease, like asthma an unusual or allergic reaction to trastuzumab,  benzyl alcohol, or other medications, foods, dyes, or preservatives pregnant or trying to get pregnant breast-feeding How should I use this medication? This drug is given as an infusion into a vein. It is administered in a hospital or clinic by a specially trained health care professional. Talk to your pediatrician regarding the use of this medicine in children. This medicine is not approved for use in children. Overdosage: If you think you have taken too much of this medicine contact a poison control center or emergency room at once. NOTE: This medicine is only for you. Do not share this medicine with others. What if I miss a dose? It is important not to miss a dose. Call your doctor or health care professional if you are unable to keep an appointment. What may interact with this medication? This medicine may interact with the following medications: certain types of chemotherapy, such as daunorubicin, doxorubicin, epirubicin, and idarubicin This list may not describe all possible interactions. Give your health care provider a list of all the medicines, herbs, non-prescription drugs, or dietary supplements you use. Also tell them if you smoke, drink alcohol, or use illegal drugs. Some items may interact with your medicine. What should I watch for while using this medication? Visit your doctor for checks on your progress. Report any side effects. Continue your course of treatment even though you feel ill unless your doctor tells you to stop. Call your doctor or health care professional for advice if you get a fever, chills or sore throat, or other symptoms of a cold or flu. Do not treat yourself. Try to avoid being around people who are sick. You may experience fever, chills and shaking during your first infusion. These effects are usually mild and can be treated with other medicines. Report any side effects during the infusion to your health care professional. Fever and chills usually do not happen  with later infusions. Do not become pregnant while taking this medicine or for 7 months after stopping it. Women should inform their doctor if they wish to become pregnant or think they might be pregnant. Women of child-bearing potential will need to have a negative pregnancy test before starting this medicine. There is a potential for serious side effects to an unborn child. Talk to your health care professional or pharmacist for more information. Do not breast-feed an infant while taking this medicine or for 7 months after stopping it. Women must use effective birth control with this medicine. What side effects may I notice from receiving this medication? Side effects that you should report to your doctor or health care professional as soon as possible: allergic reactions like skin rash, itching or hives, swelling of the face, lips, or tongue chest pain or palpitations cough dizziness feeling faint or lightheaded, falls fever general ill feeling or flu-like symptoms signs of worsening heart failure like breathing problems; swelling in your legs and feet unusually weak or tired Side effects that usually do not require medical attention (report to your doctor or health care professional if they continue or are bothersome): bone pain changes in taste diarrhea joint pain nausea/vomiting weight loss This list may not describe all possible side effects. Call your doctor for medical advice about side effects. You may report side  effects to FDA at 1-800-FDA-1088. Where should I keep my medication? This drug is given in a hospital or clinic and will not be stored at home. NOTE: This sheet is a summary. It may not cover all possible information. If you have questions about this medicine, talk to your doctor, pharmacist, or health care provider.  2022 Elsevier/Gold Standard (2016-07-09 00:00:00)  Fluorouracil, 5-FU injection What is this medication? FLUOROURACIL, 5-FU (flure oh YOOR a sil) is a  chemotherapy drug. It slows the growth of cancer cells. This medicine is used to treat many types of cancer like breast cancer, colon or rectal cancer, pancreatic cancer, and stomach cancer. This medicine may be used for other purposes; ask your health care provider or pharmacist if you have questions. COMMON BRAND NAME(S): Adrucil What should I tell my care team before I take this medication? They need to know if you have any of these conditions: blood disorders dihydropyrimidine dehydrogenase (DPD) deficiency infection (especially a virus infection such as chickenpox, cold sores, or herpes) kidney disease liver disease malnourished, poor nutrition recent or ongoing radiation therapy an unusual or allergic reaction to fluorouracil, other chemotherapy, other medicines, foods, dyes, or preservatives pregnant or trying to get pregnant breast-feeding How should I use this medication? This drug is given as an infusion or injection into a vein. It is administered in a hospital or clinic by a specially trained health care professional. Talk to your pediatrician regarding the use of this medicine in children. Special care may be needed. Overdosage: If you think you have taken too much of this medicine contact a poison control center or emergency room at once. NOTE: This medicine is only for you. Do not share this medicine with others. What if I miss a dose? It is important not to miss your dose. Call your doctor or health care professional if you are unable to keep an appointment. What may interact with this medication? Do not take this medicine with any of the following medications: live virus vaccines This medicine may also interact with the following medications: medicines that treat or prevent blood clots like warfarin, enoxaparin, and dalteparin This list may not describe all possible interactions. Give your health care provider a list of all the medicines, herbs, non-prescription drugs, or  dietary supplements you use. Also tell them if you smoke, drink alcohol, or use illegal drugs. Some items may interact with your medicine. What should I watch for while using this medication? Visit your doctor for checks on your progress. This drug may make you feel generally unwell. This is not uncommon, as chemotherapy can affect healthy cells as well as cancer cells. Report any side effects. Continue your course of treatment even though you feel ill unless your doctor tells you to stop. In some cases, you may be given additional medicines to help with side effects. Follow all directions for their use. Call your doctor or health care professional for advice if you get a fever, chills or sore throat, or other symptoms of a cold or flu. Do not treat yourself. This drug decreases your body's ability to fight infections. Try to avoid being around people who are sick. This medicine may increase your risk to bruise or bleed. Call your doctor or health care professional if you notice any unusual bleeding. Be careful brushing and flossing your teeth or using a toothpick because you may get an infection or bleed more easily. If you have any dental work done, tell your dentist you are receiving this medicine.  Avoid taking products that contain aspirin, acetaminophen, ibuprofen, naproxen, or ketoprofen unless instructed by your doctor. These medicines may hide a fever. Do not become pregnant while taking this medicine. Women should inform their doctor if they wish to become pregnant or think they might be pregnant. There is a potential for serious side effects to an unborn child. Talk to your health care professional or pharmacist for more information. Do not breast-feed an infant while taking this medicine. Men should inform their doctor if they wish to father a child. This medicine may lower sperm counts. Do not treat diarrhea with over the counter products. Contact your doctor if you have diarrhea that lasts  more than 2 days or if it is severe and watery. This medicine can make you more sensitive to the sun. Keep out of the sun. If you cannot avoid being in the sun, wear protective clothing and use sunscreen. Do not use sun lamps or tanning beds/booths. What side effects may I notice from receiving this medication? Side effects that you should report to your doctor or health care professional as soon as possible: allergic reactions like skin rash, itching or hives, swelling of the face, lips, or tongue low blood counts - this medicine may decrease the number of white blood cells, red blood cells and platelets. You may be at increased risk for infections and bleeding. signs of infection - fever or chills, cough, sore throat, pain or difficulty passing urine signs of decreased platelets or bleeding - bruising, pinpoint red spots on the skin, black, tarry stools, blood in the urine signs of decreased red blood cells - unusually weak or tired, fainting spells, lightheadedness breathing problems changes in vision chest pain mouth sores nausea and vomiting pain, swelling, redness at site where injected pain, tingling, numbness in the hands or feet redness, swelling, or sores on hands or feet stomach pain unusual bleeding Side effects that usually do not require medical attention (report to your doctor or health care professional if they continue or are bothersome): changes in finger or toe nails diarrhea dry or itchy skin hair loss headache loss of appetite sensitivity of eyes to the light stomach upset unusually teary eyes This list may not describe all possible side effects. Call your doctor for medical advice about side effects. You may report side effects to FDA at 1-800-FDA-1088. Where should I keep my medication? This drug is given in a hospital or clinic and will not be stored at home. NOTE: This sheet is a summary. It may not cover all possible information. If you have questions about  this medicine, talk to your doctor, pharmacist, or health care provider.  2022 Elsevier/Gold Standard (2021-03-13 00:00:00)

## 2021-07-04 NOTE — Telephone Encounter (Signed)
Dr. [name] spoke to Dr Sandra Cockayne  and he wants mri to be sent to him and he will get pt an appt.for [facility] . Patient has been notified of this also.

## 2021-07-04 NOTE — Progress Notes (Signed)
Hematology/Oncology Consult note [facility]  Telephone:([phone] [phone]  Patient Care Team: Venia Carbon, MD as PCP - General [name], RN as Oncology Nurse Navigator [name], MD as Consulting Physician (Hematology and Oncology)   Name of the patient: Cory [name]  [id number]  06/02/1956   Date of visit: 07/04/21  Diagnosis-  metastatic esophageal cancer with liver metastases  Chief complaint/ Reason for visit-on treatment assessment prior to cycle 26 of 5-FU trastuzumab chemotherapy  Heme/Onc history: Patient is a 65 year old male who presented with symptoms of indigestion and heartburn which gradually progressed to dysphagia.  He underwent EGD on 06/20/2020 By Dr. Vicente Males which showed a large nearly obstructing mass in the lower third of the esophagus 35 cm from incisors.  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  PD-l1 <1 %, TMB HIGH    Presently patient reports dysphagia but is able to eat cereal or soft food.  He has lost about 13 pounds in the last month itself.  Denies any significant pain.  He recently retired after many years of service and is otherwise independent of his ADLs and IADLs.     PET CT scan on 06/26/2020 showed circumferential distal esophageal mass with an SUV of 13.3 extending over 5.5 cm. Right lower paratracheal node is 0.6 cm with an SUV of 3.1. Hypermetabolic right gastric lymph nodes including 1.1 cm node with an SUV of 5.8. Numerous hypermetabolic lesions throughout the liver somewhat confluent around the periphery which were hypermetabolic between 8 and 9. Faintly hypermetabolic left adrenal gland   NGS testing showed high tumor mutational burden CPS score less than 1. CD9-NRG1 fusion. BRAF 581L, CCN E1 gain.  EMLA for fusion of BX [id number], FGF 23 gain, FGF 6 gain, MET R988C, T p53 S1 67F. ERBB2 gain but no other actionable mutations   Patient currently on  first-line FOLFOX/trastuzumab/Keytruda.  He gets Keytruda every 6 weeks    Interval history-patient reports occasional floaters especially in his right eye but denies any symptoms of decrease in visual acuity or headaches.  He has ongoing right upper extremity pain after his fall and fracture.  ECOG PS- 1 Pain scale- 5 Opioid associated constipation- no  Review of systems- Review of Systems  Constitutional:  Negative for chills, fever, malaise/fatigue and weight loss.  HENT:  Negative for congestion, ear discharge and nosebleeds.   Eyes:  Negative for blurred vision.  Respiratory:  Negative for cough, hemoptysis, sputum production, shortness of breath and wheezing.   Cardiovascular:  Negative for chest pain, palpitations, orthopnea and claudication.  Gastrointestinal:  Negative for abdominal pain, blood in stool, constipation, diarrhea, heartburn, melena, nausea and vomiting.  Genitourinary:  Negative for dysuria, flank pain, frequency, hematuria and urgency.  Musculoskeletal:  Negative for back pain, joint pain and myalgias.       Right upper extremity pain  Skin:  Negative for rash.  Neurological:  Negative for dizziness, tingling, focal weakness, seizures, weakness and headaches.  Endo/Heme/Allergies:  Does not bruise/bleed easily.  Psychiatric/Behavioral:  Negative for depression and suicidal ideas. The patient does not have insomnia.     No Known Allergies   Past Medical History:  Diagnosis Date   Esophageal adenocarcinoma ([place]) 06/23/2020   Family history of brain cancer    Family history of colon cancer    Family history of melanoma    Family history of stomach cancer    GERD (gastroesophageal reflux disease)  History of kidney stones    Nephrolithiasis    [facility]   Pancreatitis, acute    Personal history of colonic polyps    Dr Nicolasa Ducking   Psoriasis      Past Surgical History:  Procedure Laterality Date   COLONOSCOPY     COLONOSCOPY WITH PROPOFOL N/A 03/31/2020    Procedure: COLONOSCOPY WITH PROPOFOL;  Surgeon: Jonathon Bellows, MD;  Location: [facility] ENDOSCOPY;  Service: Gastroenterology;  Laterality: N/A;   ERCP     ESOPHAGOGASTRODUODENOSCOPY (EGD) WITH PROPOFOL N/A 06/19/2020   Procedure: ESOPHAGOGASTRODUODENOSCOPY (EGD) WITH PROPOFOL;  Surgeon: Jonathon Bellows, MD;  Location: [facility] ENDOSCOPY;  Service: Gastroenterology;  Laterality: N/A;   groin surgery     as a child   PORTA CATH INSERTION N/A 06/28/2020   Procedure: PORTA CATH INSERTION;  Surgeon: Algernon Huxley, MD;  Location: [place] [facility] LAB;  Service: Cardiovascular;  Laterality: N/A;   SHOULDER SURGERY      Social History   Socioeconomic History   Marital status: Married    Spouse name: Not on file   Number of children: 2   Years of education: Not on file   Highest education level: Not on file  Occupational History   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  Tobacco Use   Smoking status: Former    Types: Cigarettes    Quit date: 07/08/1988    Years since quitting: 33.0   Smokeless tobacco: Never  Vaping Use   Vaping Use: Never used  Substance and Sexual Activity   Alcohol use: No    Comment: recovering alcoholic for 13 years   Drug use: No   Sexual activity: Not Currently  Other Topics Concern   Not on file  Social History Narrative   Has living will--needs to get notarized   Daughter should be health care POA (wife with dementia)   Would accept resuscitation   Would accept tube feeds depending on the circumstance   Social Determinants of Health   Financial Resource Strain: Not on file  Food Insecurity: Not on file  Transportation Needs: Not on file  Physical Activity: Not on file  Stress: Not on file  Social Connections: Not on file  Intimate Partner Violence: Not on file    Family History  Problem Relation Age of Onset   Stomach cancer Mother    Diabetes Brother    Hypertension Brother    Hypertension Father    Prostate cancer Father    Colon  cancer Sister    Cancer Other        either cervical or ovarian, d. 8s   Brain cancer Sister      Current Outpatient Medications:    acetaminophen (TYLENOL) 325 MG tablet, Take 650 mg by mouth every 6 (six) hours as needed., Disp: , Rfl:    dexamethasone (DECADRON) 2 MG tablet, Take 2 tablets (4 mg total) by mouth 2 (two) times daily. Starting 06/28/21:  9m BID x3 days, 4 mg daily x3 days, 2 mg daily x3 days, Disp: 21 tablet, Rfl: 0   dexamethasone (DECADRON) 4 MG tablet, Take 2 tablets (8 mg total) by mouth daily. Start the day after chemotherapy for 2 days. Take with food., Disp: 30 tablet, Rfl: 1   lidocaine-prilocaine (EMLA) cream, Apply 1 application topically as needed. Apply small amount to port site at least 1 hour prior to it being accessed, cover with plastic wrap, Disp: 30 g, Rfl: 3   LORazepam (ATIVAN) 0.5 MG  tablet, Take 1 tablet (0.5 mg total) by mouth every 6 (six) hours as needed (Nausea or vomiting)., Disp: 30 tablet, Rfl: 0   Oxycodone HCl 10 MG TABS, Take 1 tablet (10 mg total) by mouth every 4 (four) hours as needed., Disp: 120 tablet, Rfl: 0   pantoprazole (PROTONIX) 40 MG tablet, TAKE 1 TABLET BY MOUTH TWICE A DAY BEFORE MEALS, Disp: 60 tablet, Rfl: 11   celecoxib (CELEBREX) 200 MG capsule, Take 200 mg by mouth 2 (two) times daily as needed. (Patient not taking: Reported on 05/23/2021), Disp: , Rfl:    ondansetron (ZOFRAN) 8 MG tablet, Take 1 tablet (8 mg total) by mouth 2 (two) times daily as needed for refractory nausea / vomiting. Start on day 3 after chemotherapy. (Patient not taking: Reported on 07/04/2021), Disp: 30 tablet, Rfl: 1   prochlorperazine (COMPAZINE) 10 MG tablet, Take 1 tablet (10 mg total) by mouth every 6 (six) hours as needed (Nausea or vomiting). (Patient not taking: Reported on 07/04/2021), Disp: 30 tablet, Rfl: 1   triamcinolone cream (KENALOG) 0.1 %, Apply 1 application topically 3 (three) times daily as needed. (Patient not taking: Reported on  07/04/2021), Disp: 300 each, Rfl: 1 No current facility-administered medications for this visit.  Facility-Administered Medications Ordered in Other Visits:    dextrose 5 % solution, , Intravenous, Once, [name], MD   fluorouracil (ADRUCIL) 5,000 mg in sodium chloride 0.9 % 150 mL chemo infusion, 5,000 mg, Intravenous, 1 day or 1 dose, [name], MD   fluorouracil (ADRUCIL) chemo injection 850 mg, 400 mg/m2 (Treatment Plan Recorded), Intravenous, Once, [name], MD   heparin lock flush 100 unit/mL, 500 Units, Intravenous, Once, [name], MD   leucovorin 850 mg in dextrose 5 % 250 mL infusion, 850 mg, Intravenous, Once, [name], MD   sodium chloride flush (NS) 0.9 % injection 10 mL, 10 mL, Intracatheter, PRN, [name], MD, 10 mL at 01/04/21 1149   sodium chloride flush (NS) 0.9 % injection 10 mL, 10 mL, Intracatheter, PRN, [name], MD   trastuzumab-anns [facility]) 357 mg in sodium chloride 0.9 % 250 mL chemo infusion, 4 mg/kg (Treatment Plan Recorded), Intravenous, Once, [name], MD, Last Rate: 534 mL/hr at 07/04/21 1237, 357 mg at 07/04/21 1237  Physical exam:  Vitals:   07/04/21 1106  BP: (!) 142/78  Pulse: 71  Resp: 16  Temp: 97.6 F (36.4 C)  TempSrc: Tympanic  SpO2: 100%  Weight: 201 lb 14.4 oz (91.6 kg)   Physical Exam Constitutional:      General: He is not in acute distress. Cardiovascular:     Rate and Rhythm: Normal rate and regular rhythm.     Heart sounds: Normal heart sounds.  Pulmonary:     Effort: Pulmonary effort is normal.     Breath sounds: Normal breath sounds.  Abdominal:     General: Bowel sounds are normal.     Palpations: Abdomen is soft.  Skin:    General: Skin is warm and dry.  Neurological:     Mental Status: He is alert and oriented to person, place, and time.     CMP Latest Ref Rng & Units 07/04/2021  Glucose 70 - 99 mg/dL 188(H)  BUN 8 - 23 mg/dL 19  Creatinine 0.61 - 1.24 mg/dL 0.72  Sodium  135 - 145 mmol/L 137  Potassium 3.5 - 5.1 mmol/L 3.6  Chloride 98 - 111 mmol/L 103  CO2 22 -  32 mmol/L 24  Calcium 8.9 - 10.3 mg/dL 9.1  Total Protein 6.5 - 8.1 g/dL 6.4(L)  Total Bilirubin 0.3 - 1.2 mg/dL 0.5  Alkaline Phos 38 - 126 U/L 201(H)  AST 15 - 41 U/L 24  ALT 0 - 44 U/L 22   CBC Latest Ref Rng & Units 07/04/2021  WBC 4.0 - 10.5 K/uL 14.7(H)  Hemoglobin 13.0 - 17.0 g/dL 12.1(L)  Hematocrit 39.0 - 52.0 % 36.8(L)  Platelets 150 - 400 K/uL 154    No images are attached to the encounter.  MR Brain W Wo Contrast  Result Date: 06/24/2021 CLINICAL DATA:  History of esophageal cancer with brain metastases. EXAM: MRI HEAD WITHOUT AND WITH CONTRAST TECHNIQUE: Multiplanar, multiecho pulse sequences of the brain and surrounding structures were obtained without and with intravenous contrast. CONTRAST:  46m GADAVIST GADOBUTROL 1 MMOL/ML IV SOLN COMPARISON:  05/16/2021 FINDINGS: Brain: 40 x 34 by 30 mm dural based mass at the junction of the tentorium and falx with growth above and below the tentorium and along the right proximal transverse sinus. The superior sagittal sinus is chronically occluded above the mass which effaces the torcula. Subdural collection with blood products along the left cerebral convexity has progressed in size, now 12 mm. Midline shift measures 4 mm. Progressive edema now in the bilateral occipital lobes with marginal hemorrhage along the left aspect of the mass parenchymal interface showing methemoglobin (subacute features) and measuring up to 17 x 8 mm on axial slices. Vascular: Dural sinus occlusion as above. Skull and upper cervical spine: No visible bone invasion or bony metastasis. Sinuses/Orbits: Intrinsically T1 hyperintense mass in the superior right orbit which becomes brighter on the postcontrast phase and has increased in size, dimensions now 13 mm anterior to posterior on sagittal images. These results will be called to the ordering clinician or representative  by the Radiologist Assistant, and communication documented in the PACS or [employer] IMPRESSION: 1. Rapid growth of the dural based mass centered at the torcula, now up to 4 cm and causing bilateral occipital edema. The is avidly enhancing and there is a subacute hemorrhage at the parenchymal mass interface at the left occiput. 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 2. Increased subdural hematoma along the left cerebral convexity measuring up to 12 mm in thickness and causing 4 mm of midline shift. 3. 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 Electronically Signed   By: JJorje GuildM.D.   On: 06/24/2021 09:17     Assessment and plan- Patient is a 65y.o. male with history of adenocarcinoma of esophagus with liver metastases.  He is here for on treatment assessment prior to cycle 26 of 5-FU trastuzumab chemotherapy  Counts okay to proceed with cycle 26 of 5-FU trastuzumab chemotherapy today.  I will see him back in 2 weeks for cycle 27.  He will receive Keytruda again with cycle 28.  I will plan to repeat echocardiogram in 3 weeks as his prior echocardiogram showed an EF of 50 to 55% and was reported to be low normal.  Patient noted to have dural based mass invading the proximal right transverse sinus in November 2022.  This was again noted in PET scan on 05/22/2021 as well.  Systemically there was no evidence of hypermetabolic disease noted on his PET scan in November 2022 but his CEA continues to rise.  Question is-is the  dural based mass metastatic esophageal cancer.  I have discussed his case with Dr. Lacinda Axon from neurosurgery who did not recommend a biopsy but consideration for definitive surgical excision could be given if radiation cannot be considered.  I will therefore refer the patient to radiation oncology and has received 1 fraction ofRapid  increase in the size of the mass measuring 4 cm associated with bilateral occipital edema as well as subacute hemorrhage at the parenchymal mass interface at the left occiput.  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  This right orbit mass has not been radiated.  SRS to his dural mass.  He also underwent a repeat MRI on 06/22/2021 prior to radiation which showed  I will get in touch with Dr. Sandra Cockayne who is his ophthalmologist and see if there would be role to refer the patient to [facility] or [facility] especially given the suspicion for choroidal tumor.  His dural based mass which is now radiated will be followed up with a repeat MRI brain sometime in early February 2022.  I would like the patient to remain on Decadron 2 mg daily for the next 1 week before we get him off steroids given the bilateral occipital edema noted on MRI on 06/22/2021.  Right shoulder pain: Unrelated to malignancy secondary to recent fall and fracture.  I will add fentanyl patch 12 mcg to his pain regimen and continue as needed oxycodone.  Patient and his daughter comprehend my plan well   Visit Diagnosis 1. Encounter for antineoplastic chemotherapy   2. Esophageal adenocarcinoma ([place])   3. 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 Hypertension, unspecified type   5. Encounter for monoclonal antibody treatment for malignancy      Dr. Randa Evens, MD, MPH [facility] [phone] 07/04/2021 12:57 PM

## 2021-07-06 ENCOUNTER — Inpatient Hospital Stay: Payer: Medicare Other

## 2021-07-06 ENCOUNTER — Other Ambulatory Visit: Payer: Self-pay

## 2021-07-06 VITALS — BP 129/68 | HR 69 | Temp 96.5°F

## 2021-07-06 DIAGNOSIS — Z5112 Encounter for antineoplastic immunotherapy: Secondary | ICD-10-CM | POA: Diagnosis not present

## 2021-07-06 DIAGNOSIS — C159 Malignant neoplasm of esophagus, unspecified: Secondary | ICD-10-CM

## 2021-07-06 MED ORDER — PEGFILGRASTIM-CBQV 6 MG/0.6ML ~~LOC~~ SOSY
6.0000 mg | PREFILLED_SYRINGE | Freq: Once | SUBCUTANEOUS | Status: AC
  Administered 2021-07-06: 14:00:00 6 mg via SUBCUTANEOUS
  Filled 2021-07-06: qty 0.6

## 2021-07-06 MED ORDER — HEPARIN SOD (PORK) LOCK FLUSH 100 UNIT/ML IV SOLN
500.0000 [IU] | Freq: Once | INTRAVENOUS | Status: AC | PRN
  Administered 2021-07-06: 14:00:00 500 [IU]
  Filled 2021-07-06: qty 5

## 2021-07-06 MED ORDER — SODIUM CHLORIDE 0.9% FLUSH
10.0000 mL | INTRAVENOUS | Status: DC | PRN
  Administered 2021-07-06: 14:00:00 10 mL
  Filled 2021-07-06: qty 10

## 2021-07-06 NOTE — Patient Instructions (Signed)

## 2021-07-16 ENCOUNTER — Telehealth: Payer: Self-pay

## 2021-07-16 NOTE — Telephone Encounter (Signed)
[facility] - Client TELEPHONE ADVICE RECORD [facility] Patient Name: Cory [name] Gender: Male DOB: [birth year] Age: 66 Y 40 M 30 D Return Phone Number: [phone] (Primary), [phone] (Secondary) Address[place]  [address] Client [facility] - Client Client Site [place] - Day Provider Viviana Simpler- MD Contact Type Call Who Is Calling Patient / Member / Family / Caregiver Call Type Triage / Clinical Relationship To Patient Self Return Phone Number [phone] (Primary) Chief Complaint Arm Pain (no known cause) Reason for Call Symptomatic / Request for Health Information Initial Comment Caller states she is Cory [name] calling from the office with a pt on the back line states his right arm, hand and both legs are swollen. Translation No Nurse Assessment Nurse: Burnadette Peter, RN, Elmo Putt Date/Time (Eastern Time): 07/16/2021 11:49:49 AM Confirm and document reason for call. If symptomatic, describe symptoms. ---Caller states his right arm and hand and both legs are swollen. Caller states the swelling started mostly on Saturday. Caller states he was given pain patches for the swelling and he is taking Oxycodone. Caller states the swelling is mild. Caller denies any redness. Caller denies a fever at this time (temp 98 this morning). 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 Caller states he had radiation on the 12/22. Does the patient have any new or worsening symptoms? ---Yes Will a triage be completed? ---Yes Related visit to physician within the last 2 weeks? ---Yes Does the PT have any chronic conditions? (i.e. diabetes, asthma, this includes High risk factors for pregnancy, etc.) ---Yes List chronic conditions. ---Sinus cancer, stage 4 liver cancer, brain cancer Is this a behavioral health or substance abuse call?  ---No Guidelines Guideline Title Affirmed Question Affirmed Notes Nurse Date/Time Eilene Ghazi Time) Hand Swelling SEVERE hand swelling (e.g., swelling of entire Wynona Canes 12/07/9507 32:67:12 AM PLEASE NOTE: All timestamps contained within this report are represented as [place] Standard Time. CONFIDENTIALTY NOTICE: This fax transmission is intended only for the addressee. It contains information that is legally privileged, confidential or otherwise protected from use or disclosure. If you are not the intended recipient, you are strictly prohibited from reviewing, disclosing, copying using or disseminating any of this information or taking any action in reliance on or regarding this information. If you have received this fax in error, please notify us immediately by telephone so that we can arrange for its return to [place]. Phone: [phone], Toll-Free: [phone], Fax: [phone] Page: 2 of 2 Call Id: [id number] Guidelines Guideline Title Affirmed Question Affirmed Notes Nurse Date/Time Eilene Ghazi Time) hand and up into forearm) Disp. Time Eilene Ghazi Time) Disposition Final User 07/16/2021 12:09:30 PM See HCP within 4 Hours (or PCP triage) Yes Burnadette Peter, RN, Laren Everts Disagree/Comply Comply Caller Understands Yes PreDisposition Go to Urgent Care/Walk-In Clinic Care Advice Given Per Guideline SEE HCP (OR PCP TRIAGE) WITHIN 4 HOURS: * IF OFFICE WILL BE OPEN: You need to be seen within the next 3 or 4 hours. Call your doctor (or NP/PA) now or as soon as the office opens. * OFFICE: If patient sounds stable and not seriously ill, consult PCP (or follow your office policy) to see if patient can be seen NOW in office. ANOTHER ADULT SHOULD DRIVE: * It is better and safer if another adult drives instead of you. CALL BACK IF: * You become worse CARE ADVICE given per Hand Swelling (Adult) guideline. Comments User: Braxton Feathers, RN  Date/Time (Eastern Time): 07/16/2021 12:10:16 PM Caller warm  transferred to office to make an appointment within the next 3-4 hours. I advised the caller that if he is not able to be seen today in the office within the next 3-4 hours to go to an UC. Caller verbalized understanding. Referrals REFERRED TO PCP OFFIC

## 2021-07-16 NOTE — Telephone Encounter (Signed)
Will assess at the [facility] tomorrow. Doesn't sound like an emergency

## 2021-07-16 NOTE — Telephone Encounter (Signed)
I spoke with pt; pt said starting on 07/14/21 he began with swelling in rt arm,rt hand and both lower extremities (upper and lower legs) pt said that this happened  back Aug 2022 when pt broke his rt arm. Pt said he cannot lay down due to causing pain in his arm; pt sits up in chair,recliner, or cough with legs hanging down.pt last saw Dr Silvio Pate for medicare wellness on 02/06/22.  Pt said now no CP. And no SOB. Pt does not see redness on rt arm or legs and no pain at this time. Pt said has CA  and has been eating more salt; pt  has been eating salt in his grits and pt eats pork sausage every morning. Pt  was triaged by access nurse and was advised to go to UC due to no appts at [facility] today. Pt s aid he spoke with [facility] [address] and pt was advised to ck PCP which pt had already done. I spoke with Dr Silvio Pate and he can add pt on his schedule 07/17/21 at 12:15 at [facility] office. Pt was given UC & ED precautions and pt voiced understanding. Pt scheduled appt with Dr Silvio Pate on 07/17/21 at 12:15 and pt will be at office on 07/17/21 at 12 noon; Sending note to Dr Silvio Pate and Larene Beach CMA.

## 2021-07-17 ENCOUNTER — Other Ambulatory Visit: Payer: Self-pay

## 2021-07-17 ENCOUNTER — Encounter: Payer: Self-pay | Admitting: Internal Medicine

## 2021-07-17 ENCOUNTER — Ambulatory Visit (INDEPENDENT_AMBULATORY_CARE_PROVIDER_SITE_OTHER): Payer: Medicare Other | Admitting: Internal Medicine

## 2021-07-17 ENCOUNTER — Ambulatory Visit
Admission: RE | Admit: 2021-07-17 | Discharge: 2021-07-17 | Disposition: A | Discharge status: Home / Self Care | Payer: Medicare Other | Source: Ambulatory Visit | Attending: Internal Medicine | Admitting: Internal Medicine

## 2021-07-17 VITALS — BP 132/70 | HR 78 | Temp 98.0°F | Ht 71.0 in | Wt 198.0 lb

## 2021-07-17 DIAGNOSIS — M7989 Other specified soft tissue disorders: Secondary | ICD-10-CM | POA: Diagnosis present

## 2021-07-17 NOTE — Assessment & Plan Note (Signed)
Known long standing humeral fracture but the swelling is new It suggests DVT---but coexistence of leg swelling could suggest a more central venous occlusion Has recent MRI of brain showing subdural hematoma--not sure if anticoagulation could even be done  I have contacted Dr [name]--- she suggests doppler of arm and referral to vascular urgently if DVT (for filter and perhaps thrombectomy) Anticoagulation may not be a good idea

## 2021-07-17 NOTE — Progress Notes (Signed)
Subjective:    Patient ID: Cory [name], male    DOB: 10-26-1955, 66 y.o.   MRN: [id number]  HPI Here due to swelling in right hand and feet Started 4 days ago Some pain--from past humerus fracture----no change Using the fentanyl patch and oxycodone oral  No SOB No chest pain  Current Outpatient Medications on File Prior to Visit  Medication Sig Dispense Refill   acetaminophen (TYLENOL) 325 MG tablet Take 650 mg by mouth every 6 (six) hours as needed.     dexamethasone (DECADRON) 2 MG tablet Take 1 tablet (2 mg total) by mouth daily. With food and in am 7 tablet 0   fentaNYL (DURAGESIC) 12 MCG/HR Place 1 patch onto the skin every 3 (three) days. 10 patch 0   lidocaine-prilocaine (EMLA) cream Apply 1 application topically as needed. Apply small amount to port site at least 1 hour prior to it being accessed, cover with plastic wrap 30 g 3   LORazepam (ATIVAN) 0.5 MG tablet Take 1 tablet (0.5 mg total) by mouth every 6 (six) hours as needed (Nausea or vomiting). 30 tablet 0   ondansetron (ZOFRAN) 8 MG tablet Take 1 tablet (8 mg total) by mouth 2 (two) times daily as needed for refractory nausea / vomiting. Start on day 3 after chemotherapy. 30 tablet 1   Oxycodone HCl 10 MG TABS Take 1 tablet (10 mg total) by mouth every 4 (four) hours as needed. 120 tablet 0   pantoprazole (PROTONIX) 40 MG tablet TAKE 1 TABLET BY MOUTH TWICE A DAY BEFORE MEALS 60 tablet 11   prochlorperazine (COMPAZINE) 10 MG tablet Take 1 tablet (10 mg total) by mouth every 6 (six) hours as needed (Nausea or vomiting). 30 tablet 1   triamcinolone cream (KENALOG) 0.1 % Apply 1 application topically 3 (three) times daily as needed. 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   Family history of brain cancer    Family history of colon cancer    Family history of melanoma    Family history of stomach cancer    GERD (gastroesophageal reflux disease)    History of kidney stones    Nephrolithiasis    [facility]   Pancreatitis, acute    Personal history of colonic polyps    Dr Nicolasa Ducking   Psoriasis     Past Surgical History:  Procedure Laterality Date   COLONOSCOPY     COLONOSCOPY WITH PROPOFOL N/A 03/31/2020   Procedure: COLONOSCOPY WITH PROPOFOL;  Surgeon: Jonathon Bellows, MD;  Location: [facility] ENDOSCOPY;  Service: Gastroenterology;  Laterality: N/A;   ERCP     ESOPHAGOGASTRODUODENOSCOPY (EGD) WITH PROPOFOL N/A 06/19/2020   Procedure: ESOPHAGOGASTRODUODENOSCOPY (EGD) WITH PROPOFOL;  Surgeon: Jonathon Bellows, MD;  Location: [facility] ENDOSCOPY;  Service: Gastroenterology;  Laterality: N/A;   groin surgery     as a child   PORTA CATH INSERTION N/A 06/28/2020   Procedure: PORTA CATH INSERTION;  Surgeon: Algernon Huxley, MD;  Location: [place] CV LAB;  Service: Cardiovascular;  Laterality: N/A;   SHOULDER SURGERY      Family History  Problem Relation Age of Onset   Stomach cancer  Mother    Diabetes Brother    Hypertension Brother    Hypertension Father    Prostate cancer Father    Colon cancer Sister    Cancer Other        either cervical or ovarian, d. 106s   Brain cancer Sister     Social History   Socioeconomic History   Marital status: Married    Spouse name: Not on file   Number of children: 2   Years of education: Not on file   Highest education level: Not on file  Occupational History   Occupation: Filtration and duct work    Comment: [employer]  Tobacco Use   Smoking status: Former    Types: Cigarettes    Quit date: 07/08/1988    Years since quitting: 33.0   Smokeless tobacco: Never  Vaping Use   Vaping Use: Never used  Substance and Sexual Activity   Alcohol use:  No    Comment: recovering alcoholic for 13 years   Drug use: No   Sexual activity: Not Currently  Other Topics Concern   Not on file  Social History Narrative   Has living will--needs to get notarized   Daughter should be health care POA (wife with dementia)   Would accept resuscitation   Would accept tube feeds depending on the circumstance   Social Determinants of Health   Financial Resource Strain: Not on file  Food Insecurity: Not on file  Transportation Needs: Not on file  Physical Activity: Not on file  Stress: Not on file  Social Connections: Not on file  Intimate Partner Violence: Not on file   Review of Systems Recent radiation treatment to brain--just had one Eating okay Not sleeping well--due to pain     Objective:   Physical Exam Constitutional:      Appearance: Normal appearance.  Cardiovascular:     Rate and Rhythm: Normal rate and regular rhythm.     Heart sounds: No murmur heard.   No gallop.     Comments: Frequent ectopy Pulmonary:     Effort: Pulmonary effort is normal.     Breath sounds: Normal breath sounds. No wheezing or rales.  Musculoskeletal:     Comments: Marked edema in right arm and hand (distal is new) 2+ edema in legs below knee (and some above knee)  Neurological:     Mental Status: He is alert.           Assessment & Plan:

## 2021-07-17 NOTE — Addendum Note (Signed)
Addended by: Pilar Grammes on: 07/17/2021 02:51 PM   Modules accepted: Orders

## 2021-07-17 NOTE — Addendum Note (Signed)
Addended by: Virl Cagey on: 07/17/2021 03:06 PM   Modules accepted: Orders

## 2021-07-18 ENCOUNTER — Inpatient Hospital Stay: Payer: Medicare Other | Attending: Oncology

## 2021-07-18 ENCOUNTER — Telehealth (INDEPENDENT_AMBULATORY_CARE_PROVIDER_SITE_OTHER): Payer: Self-pay

## 2021-07-18 ENCOUNTER — Other Ambulatory Visit: Payer: Self-pay | Admitting: *Deleted

## 2021-07-18 ENCOUNTER — Inpatient Hospital Stay: Payer: Medicare Other

## 2021-07-18 ENCOUNTER — Encounter: Payer: Self-pay | Admitting: Oncology

## 2021-07-18 ENCOUNTER — Inpatient Hospital Stay (HOSPITAL_BASED_OUTPATIENT_CLINIC_OR_DEPARTMENT_OTHER): Payer: Medicare Other | Admitting: Oncology

## 2021-07-18 VITALS — BP 138/64 | HR 77 | Temp 97.6°F | Resp 16 | Ht 71.0 in | Wt 198.8 lb

## 2021-07-18 DIAGNOSIS — Z8249 Family history of ischemic heart disease and other diseases of the circulatory system: Secondary | ICD-10-CM | POA: Insufficient documentation

## 2021-07-18 DIAGNOSIS — C155 Malignant neoplasm of lower third of esophagus: Secondary | ICD-10-CM | POA: Insufficient documentation

## 2021-07-18 DIAGNOSIS — I6200 Nontraumatic subdural hemorrhage, unspecified: Secondary | ICD-10-CM | POA: Insufficient documentation

## 2021-07-18 DIAGNOSIS — J90 Pleural effusion, not elsewhere classified: Secondary | ICD-10-CM | POA: Diagnosis not present

## 2021-07-18 DIAGNOSIS — R600 Localized edema: Secondary | ICD-10-CM | POA: Diagnosis not present

## 2021-07-18 DIAGNOSIS — Z9221 Personal history of antineoplastic chemotherapy: Secondary | ICD-10-CM | POA: Insufficient documentation

## 2021-07-18 DIAGNOSIS — Z8719 Personal history of other diseases of the digestive system: Secondary | ICD-10-CM | POA: Insufficient documentation

## 2021-07-18 DIAGNOSIS — C7951 Secondary malignant neoplasm of bone: Secondary | ICD-10-CM | POA: Diagnosis not present

## 2021-07-18 DIAGNOSIS — R1310 Dysphagia, unspecified: Secondary | ICD-10-CM | POA: Insufficient documentation

## 2021-07-18 DIAGNOSIS — R161 Splenomegaly, not elsewhere classified: Secondary | ICD-10-CM | POA: Diagnosis not present

## 2021-07-18 DIAGNOSIS — N200 Calculus of kidney: Secondary | ICD-10-CM | POA: Diagnosis not present

## 2021-07-18 DIAGNOSIS — I700 Atherosclerosis of aorta: Secondary | ICD-10-CM | POA: Diagnosis not present

## 2021-07-18 DIAGNOSIS — C159 Malignant neoplasm of esophagus, unspecified: Secondary | ICD-10-CM

## 2021-07-18 DIAGNOSIS — J432 Centrilobular emphysema: Secondary | ICD-10-CM | POA: Insufficient documentation

## 2021-07-18 DIAGNOSIS — R609 Edema, unspecified: Secondary | ICD-10-CM | POA: Insufficient documentation

## 2021-07-18 DIAGNOSIS — Z7952 Long term (current) use of systemic steroids: Secondary | ICD-10-CM | POA: Insufficient documentation

## 2021-07-18 DIAGNOSIS — C7800 Secondary malignant neoplasm of unspecified lung: Secondary | ICD-10-CM | POA: Diagnosis not present

## 2021-07-18 DIAGNOSIS — H532 Diplopia: Secondary | ICD-10-CM | POA: Insufficient documentation

## 2021-07-18 DIAGNOSIS — R590 Localized enlarged lymph nodes: Secondary | ICD-10-CM | POA: Insufficient documentation

## 2021-07-18 DIAGNOSIS — H5461 Unqualified visual loss, right eye, normal vision left eye: Secondary | ICD-10-CM | POA: Insufficient documentation

## 2021-07-18 DIAGNOSIS — K219 Gastro-esophageal reflux disease without esophagitis: Secondary | ICD-10-CM | POA: Diagnosis not present

## 2021-07-18 DIAGNOSIS — R2233 Localized swelling, mass and lump, upper limb, bilateral: Secondary | ICD-10-CM

## 2021-07-18 DIAGNOSIS — M79601 Pain in right arm: Secondary | ICD-10-CM | POA: Insufficient documentation

## 2021-07-18 DIAGNOSIS — Z808 Family history of malignant neoplasm of other organs or systems: Secondary | ICD-10-CM | POA: Insufficient documentation

## 2021-07-18 DIAGNOSIS — C7931 Secondary malignant neoplasm of brain: Secondary | ICD-10-CM | POA: Insufficient documentation

## 2021-07-18 DIAGNOSIS — N400 Enlarged prostate without lower urinary tract symptoms: Secondary | ICD-10-CM | POA: Diagnosis not present

## 2021-07-18 DIAGNOSIS — M7989 Other specified soft tissue disorders: Secondary | ICD-10-CM | POA: Insufficient documentation

## 2021-07-18 DIAGNOSIS — K4090 Unilateral inguinal hernia, without obstruction or gangrene, not specified as recurrent: Secondary | ICD-10-CM | POA: Insufficient documentation

## 2021-07-18 DIAGNOSIS — L723 Sebaceous cyst: Secondary | ICD-10-CM | POA: Insufficient documentation

## 2021-07-18 DIAGNOSIS — I340 Nonrheumatic mitral (valve) insufficiency: Secondary | ICD-10-CM | POA: Diagnosis not present

## 2021-07-18 DIAGNOSIS — Z833 Family history of diabetes mellitus: Secondary | ICD-10-CM | POA: Insufficient documentation

## 2021-07-18 DIAGNOSIS — Z8042 Family history of malignant neoplasm of prostate: Secondary | ICD-10-CM | POA: Insufficient documentation

## 2021-07-18 DIAGNOSIS — C787 Secondary malignant neoplasm of liver and intrahepatic bile duct: Secondary | ICD-10-CM | POA: Insufficient documentation

## 2021-07-18 DIAGNOSIS — R601 Generalized edema: Secondary | ICD-10-CM | POA: Insufficient documentation

## 2021-07-18 DIAGNOSIS — Z79899 Other long term (current) drug therapy: Secondary | ICD-10-CM | POA: Insufficient documentation

## 2021-07-18 DIAGNOSIS — G893 Neoplasm related pain (acute) (chronic): Secondary | ICD-10-CM | POA: Insufficient documentation

## 2021-07-18 DIAGNOSIS — K7460 Unspecified cirrhosis of liver: Secondary | ICD-10-CM | POA: Diagnosis not present

## 2021-07-18 DIAGNOSIS — H538 Other visual disturbances: Secondary | ICD-10-CM | POA: Insufficient documentation

## 2021-07-18 DIAGNOSIS — Z87891 Personal history of nicotine dependence: Secondary | ICD-10-CM | POA: Insufficient documentation

## 2021-07-18 DIAGNOSIS — Z8049 Family history of malignant neoplasm of other genital organs: Secondary | ICD-10-CM | POA: Insufficient documentation

## 2021-07-18 DIAGNOSIS — Z95828 Presence of other vascular implants and grafts: Secondary | ICD-10-CM

## 2021-07-18 DIAGNOSIS — Z800 Family history of malignant neoplasm of digestive organs: Secondary | ICD-10-CM | POA: Insufficient documentation

## 2021-07-18 LAB — COMPREHENSIVE METABOLIC PANEL
ALT: 24 U/L (ref 0–44)
AST: 32 U/L (ref 15–41)
Albumin: 3.7 g/dL (ref 3.5–5.0)
Alkaline Phosphatase: 208 U/L — ABNORMAL HIGH (ref 38–126)
Anion gap: 8 (ref 5–15)
BUN: 10 mg/dL (ref 8–23)
CO2: 24 mmol/L (ref 22–32)
Calcium: 8.6 mg/dL — ABNORMAL LOW (ref 8.9–10.3)
Chloride: 102 mmol/L (ref 98–111)
Creatinine, Ser: 0.87 mg/dL (ref 0.61–1.24)
GFR, Estimated: >60 mL/min (ref >60)
Glucose, Bld: 183 mg/dL — ABNORMAL HIGH (ref 70–99)
Potassium: 3.5 mmol/L (ref 3.5–5.1)
Sodium: 134 mmol/L — ABNORMAL LOW (ref 135–145)
Total Bilirubin: 1.3 mg/dL — ABNORMAL HIGH (ref 0.3–1.2)
Total Protein: 6.5 g/dL (ref 6.5–8.1)

## 2021-07-18 LAB — CBC WITH DIFFERENTIAL/PLATELET
Abs Immature Granulocytes: 0.08 10*3/uL — ABNORMAL HIGH (ref 0.00–0.07)
Basophils Absolute: 0 10*3/uL (ref 0.0–0.1)
Basophils Relative: 0 %
Eosinophils Absolute: 0.3 10*3/uL (ref 0.0–0.5)
Eosinophils Relative: 4 %
HCT: 35.8 % — ABNORMAL LOW (ref 39.0–52.0)
Hemoglobin: 11.6 g/dL — ABNORMAL LOW (ref 13.0–17.0)
Immature Granulocytes: 1 %
Lymphocytes Relative: 8 %
Lymphs Abs: 0.6 10*3/uL — ABNORMAL LOW (ref 0.7–4.0)
MCH: 29.1 pg (ref 26.0–34.0)
MCHC: 32.4 g/dL (ref 30.0–36.0)
MCV: 89.9 fL (ref 80.0–100.0)
Monocytes Absolute: 0.6 10*3/uL (ref 0.1–1.0)
Monocytes Relative: 9 %
Neutro Abs: 5.2 10*3/uL (ref 1.7–7.7)
Neutrophils Relative %: 78 %
Platelets: 134 10*3/uL — ABNORMAL LOW (ref 150–400)
RBC: 3.98 MIL/uL — ABNORMAL LOW (ref 4.22–5.81)
RDW: 16.1 % — ABNORMAL HIGH (ref 11.5–15.5)
WBC: 6.8 10*3/uL (ref 4.0–10.5)
nRBC: 0 % (ref 0.0–0.2)

## 2021-07-18 MED ORDER — PREDNISONE 10 MG PO TABS: 10.0000 mg | ORAL_TABLET | Freq: Every day | ORAL | 0 refills | Status: DC

## 2021-07-18 MED ORDER — HEPARIN SOD (PORK) LOCK FLUSH 100 UNIT/ML IV SOLN
500.0000 [IU] | Freq: Once | INTRAVENOUS | Status: AC
  Administered 2021-07-18: 500 [IU] via INTRAVENOUS
  Filled 2021-07-18: qty 5

## 2021-07-18 MED ORDER — OXYCODONE HCL 10 MG PO TABS: 10.0000 mg | ORAL_TABLET | ORAL | 0 refills | Status: DC | PRN

## 2021-07-18 NOTE — Progress Notes (Signed)
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  He underwent EGD on 06/20/2020 By Dr. Vicente Males which showed a large nearly obstructing mass in the lower third of the esophagus 35 cm from incisors.  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  PD-l1 <1 %, TMB HIGH    Presently patient reports dysphagia but is able to eat cereal or soft food.  He has lost about 13 pounds in the last month itself.  Denies any significant pain.  He recently retired after many years of service and is otherwise independent of his ADLs and IADLs.     PET CT scan on 06/26/2020 showed circumferential distal esophageal mass with an SUV of 13.3 extending over 5.5 cm. Right lower paratracheal node is 0.6 cm with an SUV of 3.1. Hypermetabolic right gastric lymph nodes including 1.1 cm node with an SUV of 5.8. Numerous hypermetabolic lesions throughout the liver somewhat confluent around the periphery which were hypermetabolic between 8 and 9. Faintly hypermetabolic left adrenal gland   NGS testing showed high tumor mutational burden CPS score less than 1. CD9-NRG1 fusion. BRAF 581L, CCN E1 gain.  EMLA for fusion of BX [id number], FGF 23 gain, FGF 6 gain, MET R988C, T p53 S1 47F. ERBB2 gain but no other actionable mutations   Patient currently on  first-line FOLFOX/trastuzumab/Keytruda.  He gets Keytruda every 6 weeks  Interval history-patient reports having significant right upper extremity swelling which he developed in the last 3 to 4 days.  Symptoms are worse this morning denies other complaints  ECOG PS- 1 Pain scale- 2 Opioid associated constipation- no  Review of systems- Review of Systems  Constitutional:  Negative for chills, fever, malaise/fatigue and weight loss.  HENT:  Negative for congestion, ear discharge and nosebleeds.   Eyes:  Negative for blurred vision.  Respiratory:  Negative for cough, hemoptysis, sputum production, shortness of breath and wheezing.   Cardiovascular:  Negative for chest pain, palpitations, orthopnea and claudication.  Gastrointestinal:  Negative for abdominal pain, blood in stool, constipation, diarrhea, heartburn, melena, nausea and vomiting.  Genitourinary:  Negative for dysuria, flank pain, frequency, hematuria and urgency.  Musculoskeletal:  Negative for back pain, joint pain and myalgias.       B/l LE edema. RUE significantly more swollen than left  Skin:  Negative for rash.  Neurological:  Negative for dizziness, tingling, focal weakness, seizures, weakness and headaches.  Endo/Heme/Allergies:  Does not bruise/bleed easily.  Psychiatric/Behavioral:  Negative for depression and suicidal ideas. The patient does not have insomnia.      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   Family history of brain cancer    Family history of colon cancer    Family history of melanoma    Family history of stomach cancer    GERD (gastroesophageal reflux disease)    History  of kidney stones    Nephrolithiasis    [facility]   Pancreatitis, acute    Personal history of colonic polyps    Dr Nicolasa Ducking   Psoriasis      Past Surgical History:  Procedure Laterality Date   COLONOSCOPY     COLONOSCOPY WITH PROPOFOL N/A 03/31/2020    Procedure: COLONOSCOPY WITH PROPOFOL;  Surgeon: Jonathon Bellows, MD;  Location: [facility] ENDOSCOPY;  Service: Gastroenterology;  Laterality: N/A;   ERCP     ESOPHAGOGASTRODUODENOSCOPY (EGD) WITH PROPOFOL N/A 06/19/2020   Procedure: ESOPHAGOGASTRODUODENOSCOPY (EGD) WITH PROPOFOL;  Surgeon: Jonathon Bellows, MD;  Location: [facility] ENDOSCOPY;  Service: Gastroenterology;  Laterality: N/A;   groin surgery     as a child   PORTA CATH INSERTION N/A 06/28/2020   Procedure: PORTA CATH INSERTION;  Surgeon: Algernon Huxley, MD;  Location: [place] [facility] LAB;  Service: Cardiovascular;  Laterality: N/A;   SHOULDER SURGERY      Social History   Socioeconomic History   Marital status: Married    Spouse name: Not on file   Number of children: 2   Years of education: Not on file   Highest education level: Not on file  Occupational History   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  Tobacco Use   Smoking status: Former    Types: Cigarettes    Quit date: 07/08/1988    Years since quitting: 33.0   Smokeless tobacco: Never  Vaping Use   Vaping Use: Never used  Substance and Sexual Activity   Alcohol use: No    Comment: recovering alcoholic for 13 years   Drug use: No   Sexual activity: Not Currently  Other Topics Concern   Not on file  Social History Narrative   Has living will--needs to get notarized   Daughter should be health care POA (wife with dementia)   Would accept resuscitation   Would accept tube feeds depending on the circumstance   Social Determinants of Health   Financial Resource Strain: Not on file  Food Insecurity: Not on file  Transportation Needs: Not on file  Physical Activity: Not on file  Stress: Not on file  Social Connections: Not on file  Intimate Partner Violence: Not on file    Family History  Problem Relation Age of Onset   Stomach cancer Mother    Diabetes Brother    Hypertension Brother    Hypertension Father    Prostate cancer Father    Colon  cancer Sister    Cancer Other        either cervical or ovarian, d. 56s   Brain cancer Sister      Current Outpatient Medications:    dexamethasone (DECADRON) 2 MG tablet, Take 1 tablet (2 mg total) by mouth daily. With food and in am, Disp: 7 tablet, Rfl: 0   fentaNYL (DURAGESIC) 12 MCG/HR, Place 1 patch onto the skin every 3 (three) days., Disp: 10 patch, Rfl: 0   lidocaine-prilocaine (EMLA) cream, Apply 1 application topically as needed. Apply small amount to port site at least 1 hour prior to it being accessed, cover with plastic wrap, Disp: 30 g, Rfl: 3   LORazepam (ATIVAN) 0.5 MG tablet, Take 1 tablet (0.5 mg total) by mouth every 6 (six) hours as needed (Nausea or vomiting)., Disp: 30 tablet, Rfl: 0   ondansetron (ZOFRAN) 8 MG tablet, Take 1 tablet (8 mg total) by mouth 2 (two) times daily as needed for refractory nausea / vomiting.  Start on day 3 after chemotherapy., Disp: 30 tablet, Rfl: 1   Oxycodone HCl 10 MG TABS, Take 1 tablet (10 mg total) by mouth every 4 (four) hours as needed., Disp: 120 tablet, Rfl: 0   pantoprazole (PROTONIX) 40 MG tablet, TAKE 1 TABLET BY MOUTH TWICE A DAY BEFORE MEALS, Disp: 60 tablet, Rfl: 11   prochlorperazine (COMPAZINE) 10 MG tablet, Take 1 tablet (10 mg total) by mouth every 6 (six) hours as needed (Nausea or vomiting)., Disp: 30 tablet, Rfl: 1   triamcinolone cream (KENALOG) 0.1 %, Apply 1 application topically 3 (three) times daily as needed., Disp: 300 each, Rfl: 1   acetaminophen (TYLENOL) 325 MG tablet, Take 650 mg by mouth every 6 (six) hours as needed. (Patient not taking: Reported on 07/18/2021), Disp: , Rfl:  No current facility-administered medications for this visit.  Facility-Administered Medications Ordered in Other Visits:    sodium chloride flush (NS) 0.9 % injection 10 mL, 10 mL, Intracatheter, PRN, [name], MD, 10 mL at 01/04/21 1149  Physical exam:  Vitals:   07/18/21 0954  BP: 138/64  Pulse: 77  Resp: 16  Temp: 97.6 F  (36.4 C)  TempSrc: Tympanic  SpO2: 96%  Weight: 198 lb 12.8 oz (90.2 kg)  Height: _0  (1.803 m)   Physical Exam Constitutional:      General: He is not in acute distress. Cardiovascular:     Rate and Rhythm: Normal rate and regular rhythm.     Heart sounds: Normal heart sounds.  Pulmonary:     Effort: Pulmonary effort is normal.     Breath sounds: Normal breath sounds.  Musculoskeletal:     Comments: Bilateral lower extremity edema.  Right upper extremity significantly more swollen than the left  Skin:    General: Skin is warm and dry.  Neurological:     Mental Status: He is alert and oriented to person, place, and time.     CMP Latest Ref Rng & Units 07/18/2021  Glucose 70 - 99 mg/dL 183(H)  BUN 8 - 23 mg/dL 10  Creatinine 0.61 - 1.24 mg/dL 0.87  Sodium 135 - 145 mmol/L 134(L)  Potassium 3.5 - 5.1 mmol/L 3.5  Chloride 98 - 111 mmol/L 102  CO2 22 - 32 mmol/L 24  Calcium 8.9 - 10.3 mg/dL 8.6(L)  Total Protein 6.5 - 8.1 g/dL 6.5  Total Bilirubin 0.3 - 1.2 mg/dL 1.3(H)  Alkaline Phos 38 - 126 U/L 208(H)  AST 15 - 41 U/L 32  ALT 0 - 44 U/L 24   CBC Latest Ref Rng & Units 07/18/2021  WBC 4.0 - 10.5 K/uL 6.8  Hemoglobin 13.0 - 17.0 g/dL 11.6(L)  Hematocrit 39.0 - 52.0 % 35.8(L)  Platelets 150 - 400 K/uL 134(L)      MR Brain W Wo Contrast  Result Date: 06/24/2021 CLINICAL DATA:  History of esophageal cancer with brain metastases. EXAM: MRI HEAD WITHOUT AND WITH CONTRAST TECHNIQUE: Multiplanar, multiecho pulse sequences of the brain and surrounding structures were obtained without and with intravenous contrast. CONTRAST:  19m GADAVIST GADOBUTROL 1 MMOL/ML IV SOLN COMPARISON:  05/16/2021 FINDINGS: Brain: 40 x 34 by 30 mm dural based mass at the junction of the tentorium and falx with growth above and below the tentorium and along the right proximal transverse sinus. The superior sagittal sinus is chronically occluded above the mass which effaces the torcula. Subdural  collection with blood products along the left cerebral convexity has progressed in size, now 12 mm. Midline  shift measures 4 mm. Progressive edema now in the bilateral occipital lobes with marginal hemorrhage along the left aspect of the mass parenchymal interface showing methemoglobin (subacute features) and measuring up to 17 x 8 mm on axial slices. Vascular: Dural sinus occlusion as above. Skull and upper cervical spine: No visible bone invasion or bony metastasis. Sinuses/Orbits: Intrinsically T1 hyperintense mass in the superior right orbit which becomes brighter on the postcontrast phase and has increased in size, dimensions now 13 mm anterior to posterior on sagittal images. These results will be called to the ordering clinician or representative by the Radiologist Assistant, and communication documented in the PACS or [employer]. IMPRESSION: 1. Rapid growth of the dural based mass centered at the torcula, now up to 4 cm and causing bilateral occipital edema. The is avidly enhancing and there is a subacute hemorrhage at the parenchymal mass interface at the left occiput. 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 2. Increased subdural hematoma along the left cerebral convexity measuring up to 12 mm in thickness and causing 4 mm of midline shift. 3. 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 Electronically Signed   By: Jorje Guild M.D.   On: 06/24/2021 09:17   US Venous Img Upper Uni Right(DVT)  Result Date: 07/17/2021 CLINICAL DATA:  Right arm swelling, pain EXAM: RIGHT UPPER EXTREMITY VENOUS DOPPLER ULTRASOUND TECHNIQUE: Gray-scale sonography with graded compression, as well as color Doppler and duplex ultrasound were performed to evaluate the upper extremity deep venous system from the level of the subclavian vein and including the jugular, axillary,  basilic, radial, ulnar and upper cephalic vein. Spectral Doppler was utilized to evaluate flow at rest and with distal augmentation maneuvers. COMPARISON:  None. FINDINGS: Contralateral Subclavian Vein: Respiratory phasicity is normal and symmetric with the symptomatic side. No evidence of thrombus. Normal compressibility. Internal Jugular Vein: No evidence of thrombus. Normal compressibility, respiratory phasicity and response to augmentation. Subclavian Vein: No evidence of thrombus. Normal compressibility, respiratory phasicity and response to augmentation. Axillary Vein: No evidence of thrombus. Normal compressibility, respiratory phasicity and response to augmentation. Cephalic Vein: No evidence of thrombus. Normal compressibility, respiratory phasicity and response to augmentation. Basilic Vein: No evidence of thrombus. Normal compressibility, respiratory phasicity and response to augmentation. Brachial Veins: No evidence of thrombus. Normal compressibility, respiratory phasicity and response to augmentation. Radial Veins: No evidence of thrombus. Normal compressibility, respiratory phasicity and response to augmentation. Ulnar Veins: No evidence of thrombus. Normal compressibility, respiratory phasicity and response to augmentation. IMPRESSION: No evidence of DVT within the right upper extremity. Electronically Signed   By: Jerilynn Mages.  Shick M.D.   On: 07/17/2021 16:20     Assessment and plan- Patient is a 66 y.o. male with history of adenocarcinoma of esophagus with liver metastases.  He is here for on treatment assessment prior to cycle 27 of 5-FU trastuzumab chemotherapy  Right upper extremity swelling: Doppler ultrasound was negative for DVT.  Patient states that all the swelling started after he started using the fentanyl patch but this does not appear to be consistent with drug reaction.  I have asked him to hold off on the fentanyl patch at this time regardless.  He does have a maculopapular rash over his  entire right upper extremity as well in addition to patchy rash over his lower extremity.  I will put him on Medrol Dosepak at this time.  I am  also getting a CT chest abdomen pelvis with contrast to assess how his tumor is doing and if there is any concern for tumor related obstruction that would explain his right upper extremity swelling.  I also reached out to Dr. Lucky Cowboy who will evaluate him in a day or 2 to see for a venogram as needed for any possible vascular occlusion higher up in the chest.  If all evaluation is negative I will consider repeating an MRI brain.  His latest MRI brain last month did show evidence of subdural hematoma.  Anticoagulation if needed will therefore be tricky in this patient.  I will evaluate him again in 1 week's time and decide if we can resume chemotherapy at that time   Visit Diagnosis 1. Esophageal adenocarcinoma ([place])   2. Localized swelling, mass, or lump of upper extremity, bilateral      Dr. Randa Evens, MD, MPH [facility] at [facility] [phone] 07/18/2021 10:57 AM

## 2021-07-19 ENCOUNTER — Ambulatory Visit (INDEPENDENT_AMBULATORY_CARE_PROVIDER_SITE_OTHER): Payer: Medicare Other | Admitting: Vascular Surgery

## 2021-07-19 ENCOUNTER — Encounter (INDEPENDENT_AMBULATORY_CARE_PROVIDER_SITE_OTHER): Payer: Self-pay | Admitting: Vascular Surgery

## 2021-07-19 ENCOUNTER — Ambulatory Visit
Admission: RE | Admit: 2021-07-19 | Discharge: 2021-07-19 | Disposition: A | Discharge status: Home / Self Care | Payer: Medicare Other | Source: Ambulatory Visit | Attending: Oncology | Admitting: Oncology

## 2021-07-19 ENCOUNTER — Other Ambulatory Visit: Payer: Self-pay | Admitting: Oncology

## 2021-07-19 ENCOUNTER — Other Ambulatory Visit: Payer: Self-pay

## 2021-07-19 VITALS — BP 163/77 | HR 80 | Resp 16 | Wt 202.6 lb

## 2021-07-19 DIAGNOSIS — R2233 Localized swelling, mass and lump, upper limb, bilateral: Secondary | ICD-10-CM

## 2021-07-19 DIAGNOSIS — C159 Malignant neoplasm of esophagus, unspecified: Secondary | ICD-10-CM

## 2021-07-19 DIAGNOSIS — I890 Lymphedema, not elsewhere classified: Secondary | ICD-10-CM

## 2021-07-19 DIAGNOSIS — M7989 Other specified soft tissue disorders: Secondary | ICD-10-CM | POA: Diagnosis not present

## 2021-07-19 DIAGNOSIS — K2100 Gastro-esophageal reflux disease with esophagitis, without bleeding: Secondary | ICD-10-CM | POA: Diagnosis not present

## 2021-07-19 MED ORDER — IOHEXOL 350 MG/ML SOLN
125.0000 mL | Freq: Once | INTRAVENOUS | Status: AC | PRN
  Administered 2021-07-19: 125 mL via INTRAVENOUS

## 2021-07-19 NOTE — Progress Notes (Signed)
MRN : [id number]  Cory [name] is a 66 y.o. (1956/03/21) male who presents with chief complaint of swollen right arm.  History of Present Illness:   I am asked to evaluate the patient by Dr. [name].  Patient is a 66 year old gentleman with a history of stage IV esophageal cancer who is been undergoing treatment for a while.  Approximately 7 to 10 days ago he noted a rather significant increase in swelling of his right arm.  Although it is not associated with significant pain redness or fever chills it is severe enough that he can no longer close his hand.  A CT scan with a venous phase was obtained and I have personally reviewed the scan.  It does appear to me that there are an increased number of collaterals around the shoulder and there is an area of the mid subclavian that appears to be narrowed.  Furthermore, the right internal jugular appears to be larger than the left which would also support increased collaterals from a subclavian lesion.  Also noted by the CT scan was lymphadenopathy suggesting metastatic infiltration and this would also increase the upper extremity lymphedema  Current Meds  Medication Sig   dexamethasone (DECADRON) 2 MG tablet Take 1 tablet (2 mg total) by mouth daily. With food and in am   fentaNYL (DURAGESIC) 12 MCG/HR Place 1 patch onto the skin every 3 (three) days.   lidocaine-prilocaine (EMLA) cream Apply 1 application topically as needed. Apply small amount to port site at least 1 hour prior to it being accessed, cover with plastic wrap   LORazepam (ATIVAN) 0.5 MG tablet Take 1 tablet (0.5 mg total) by mouth every 6 (six) hours as needed (Nausea or vomiting).   ondansetron (ZOFRAN) 8 MG tablet Take 1 tablet (8 mg total) by mouth 2 (two) times daily as needed for refractory nausea / vomiting. Start on day 3 after chemotherapy.   Oxycodone HCl 10 MG TABS Take 1 tablet (10 mg total) by mouth every 4 (four) hours as needed.   pantoprazole (PROTONIX) 40 MG tablet  TAKE 1 TABLET BY MOUTH TWICE A DAY BEFORE MEALS   predniSONE (DELTASONE) 10 MG tablet Take 1 tablet (10 mg total) by mouth daily with breakfast. Take 6 tablets x 2 days, then 5 tablet x 2 days, then 4 tablets x 2 days, 3 tablets x 2 days, 2 tablets x 2 days, then 1 tablet x 1 day and make sure to eat food before taking med. Take med every morning   prochlorperazine (COMPAZINE) 10 MG tablet Take 1 tablet (10 mg total) by mouth every 6 (six) hours as needed (Nausea or vomiting).   triamcinolone cream (KENALOG) 0.1 % Apply 1 application topically 3 (three) times daily as needed.    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   Family history of brain cancer    Family history of colon cancer    Family history of melanoma    Family history of stomach cancer    GERD (gastroesophageal reflux disease)    History of kidney stones    Nephrolithiasis    [facility]   Pancreatitis, acute    Personal history of colonic polyps    Dr Nicolasa Ducking   Psoriasis     Past Surgical History:  Procedure Laterality Date   COLONOSCOPY     COLONOSCOPY WITH PROPOFOL N/A 03/31/2020   Procedure: COLONOSCOPY WITH PROPOFOL;  Surgeon: Jonathon Bellows, MD;  Location: [facility] ENDOSCOPY;  Service:  Gastroenterology;  Laterality: N/A;   ERCP     ESOPHAGOGASTRODUODENOSCOPY (EGD) WITH PROPOFOL N/A 06/19/2020   Procedure: ESOPHAGOGASTRODUODENOSCOPY (EGD) WITH PROPOFOL;  Surgeon: Jonathon Bellows, MD;  Location[facility] ENDOSCOPY;  Service: Gastroenterology;  Laterality: N/A;   groin surgery     as a child   PORTA CATH INSERTION N/A 06/28/2020   Procedure: PORTA CATH INSERTION;  Surgeon: Algernon Huxley, MD;  Location: [place] [facility] LAB;  Service: Cardiovascular;  Laterality: N/A;   SHOULDER SURGERY      Social History Social History   Tobacco Use   Smoking status: Former    Types: Cigarettes    Quit date: 07/08/1988    Years since quitting: 33.0   Smokeless tobacco: Never  Vaping Use   Vaping Use:  Never used  Substance Use Topics   Alcohol use: No    Comment: recovering alcoholic for 13 years   Drug use: No    Family History Family History  Problem Relation Age of Onset   Stomach cancer Mother    Diabetes Brother    Hypertension Brother    Hypertension Father    Prostate cancer Father    Colon cancer Sister    Cancer Other        either cervical or ovarian, d. 25s   Brain cancer Sister     No Known Allergies   REVIEW OF SYSTEMS (Negative unless checked)  Constitutional: [] Weight loss  [] Fever  [] Chills Cardiac: [] Chest pain   [] Chest pressure   [] Palpitations   [] Shortness of breath when laying flat   [] Shortness of breath with exertion. Vascular:  [] Pain in legs with walking   [] Pain in legs at rest  [] History of DVT   [] Phlebitis   [x] Swelling in legs   [] Varicose veins   [] Non-healing ulcers Pulmonary:   [] Uses home oxygen   [] Productive cough   [] Hemoptysis   [] Wheeze  [] COPD   [] Asthma Neurologic:  [] Dizziness   [] Seizures   [] History of stroke   [] History of TIA  [] Aphasia   [] Vissual changes   [x] Weakness or numbness in arm   [] Weakness or numbness in leg Musculoskeletal:   [] Joint swelling   [] Joint pain   [] Low back pain Hematologic:  [] Easy bruising  [] Easy bleeding   [] Hypercoagulable state   [] Anemic Gastrointestinal:  [] Diarrhea   [] Vomiting  [x] Gastroesophageal reflux/heartburn   [] Difficulty swallowing. Genitourinary:  [] Chronic kidney disease   [] Difficult urination  [] Frequent urination   [] Blood in urine Skin:  [] Rashes   [] Ulcers  Psychological:  [] History of anxiety   []  History of major depression.  Physical Examination  Vitals:   07/19/21 1007  BP: (!) 163/77  Pulse: 80  Resp: 16  Weight: 202 lb 9.6 oz (91.9 kg)   Body mass index is 28.26 kg/m. Gen: WD/WN, NAD Head: /AT, No temporalis wasting.  Ear/Nose/Throat: Hearing grossly intact, nares w/o erythema or drainage, pinna without lesions Eyes: PER, EOMI, sclera nonicteric.  Neck:  Supple, no gross masses.  No JVD.  Pulmonary:  Good air movement, no audible wheezing, no use of accessory muscles.  Cardiac: RRR, precordium not hyperdynamic. Vascular:  4+ soft pitting edema of the entire arm Vessel Right Left  Radial Palpable Palpable  Gastrointestinal: soft, non-distended. No guarding/no peritoneal signs.  Musculoskeletal: M/S 5/5 throughout.  No deformity.  Neurologic: CN 2-12 intact. Pain and light touch intact in extremities.  Symmetrical.  Speech is fluent. Motor exam as listed above. Psychiatric: Judgment intact, Mood & affect appropriate for pt's clinical  situation. Dermatologic: Venous rashes no ulcers noted.  No changes consistent with cellulitis. Lymph : No lichenification or skin changes of chronic lymphedema.  CBC Lab Results  Component Value Date   WBC 6.8 07/18/2021   HGB 11.6 (L) 07/18/2021   HCT 35.8 (L) 07/18/2021   MCV 89.9 07/18/2021   PLT 134 (L) 07/18/2021    BMET    Component Value Date/Time   NA 134 (L) 07/18/2021 0926   K 3.5 07/18/2021 0926   CL 102 07/18/2021 0926   CO2 24 07/18/2021 0926   GLUCOSE 183 (H) 07/18/2021 0926   BUN 10 07/18/2021 0926   CREATININE 0.87 07/18/2021 0926   CALCIUM 8.6 (L) 07/18/2021 0926   GFRNONAA >60 07/18/2021 0926   Estimated Creatinine Clearance: 96.8 mL/min (by C-G formula based on SCr of 0.87 mg/dL).  COAG No results found for: INR, PROTIME  Radiology MR Brain W Wo Contrast  Result Date: 06/24/2021 CLINICAL DATA:  History of esophageal cancer with brain metastases. EXAM: MRI HEAD WITHOUT AND WITH CONTRAST TECHNIQUE: Multiplanar, multiecho pulse sequences of the brain and surrounding structures were obtained without and with intravenous contrast. CONTRAST:  25mL GADAVIST GADOBUTROL 1 MMOL/ML IV SOLN COMPARISON:  05/16/2021 FINDINGS: Brain: 40 x 34 by 30 mm dural based mass at the junction of the tentorium and falx with growth above and below the tentorium and along the right proximal transverse  sinus. The superior sagittal sinus is chronically occluded above the mass which effaces the torcula. Subdural collection with blood products along the left cerebral convexity has progressed in size, now 12 mm. Midline shift measures 4 mm. Progressive edema now in the bilateral occipital lobes with marginal hemorrhage along the left aspect of the mass parenchymal interface showing methemoglobin (subacute features) and measuring up to 17 x 8 mm on axial slices. Vascular: Dural sinus occlusion as above. Skull and upper cervical spine: No visible bone invasion or bony metastasis. Sinuses/Orbits: Intrinsically T1 hyperintense mass in the superior right orbit which becomes brighter on the postcontrast phase and has increased in size, dimensions now 13 mm anterior to posterior on sagittal images. These results will be called to the ordering clinician or representative by the Radiologist Assistant, and communication documented in the PACS or [employer]. IMPRESSION: 1. Rapid growth of the dural based mass centered at the torcula, now up to 4 cm and causing bilateral occipital edema. The is avidly enhancing and there is a subacute hemorrhage at the parenchymal mass interface at the left occiput. 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 2. Increased subdural hematoma along the left cerebral convexity measuring up to 12 mm in thickness and causing 4 mm of midline shift. 3. 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 Electronically Signed   By: Jorje Guild M.D.   On: 06/24/2021 09:17   US Venous Img Upper Uni Right(DVT)  Result Date: 07/17/2021 CLINICAL DATA:  Right arm swelling, pain EXAM: RIGHT UPPER EXTREMITY VENOUS DOPPLER ULTRASOUND TECHNIQUE: Gray-scale sonography with graded compression, as well as color Doppler and duplex ultrasound were performed to evaluate the  upper extremity deep venous system from the level of the subclavian vein and including the jugular, axillary, basilic, radial, ulnar and upper cephalic vein. Spectral Doppler was utilized to evaluate flow at rest and with distal augmentation maneuvers. COMPARISON:  None. FINDINGS: Contralateral Subclavian Vein: Respiratory phasicity is normal and symmetric with the symptomatic  side. No evidence of thrombus. Normal compressibility. Internal Jugular Vein: No evidence of thrombus. Normal compressibility, respiratory phasicity and response to augmentation. Subclavian Vein: No evidence of thrombus. Normal compressibility, respiratory phasicity and response to augmentation. Axillary Vein: No evidence of thrombus. Normal compressibility, respiratory phasicity and response to augmentation. Cephalic Vein: No evidence of thrombus. Normal compressibility, respiratory phasicity and response to augmentation. Basilic Vein: No evidence of thrombus. Normal compressibility, respiratory phasicity and response to augmentation. Brachial Veins: No evidence of thrombus. Normal compressibility, respiratory phasicity and response to augmentation. Radial Veins: No evidence of thrombus. Normal compressibility, respiratory phasicity and response to augmentation. Ulnar Veins: No evidence of thrombus. Normal compressibility, respiratory phasicity and response to augmentation. IMPRESSION: No evidence of DVT within the right upper extremity. Electronically Signed   By: Jerilynn Mages.  Shick M.D.   On: 07/17/2021 16:20     Assessment/Plan 1. Lymphedema Given the severity of the patient's lymphedema and the fact that it is inhibiting daily activities I believe further investigation is warranted.  I believe the CT scan suggests there is a narrowing of the subclavian vein which is very likely secondary to external compression by tumor burden.  I recommended venography of the right upper extremity with the hope for intervention and possible stent  placement.  Risk and benefits were reviewed with the patient all questions been answered patient agreed to proceed  2. Swelling of arm See #1  3. Esophageal adenocarcinoma ([place]) Plan per Dr. [name]  4. Gastroesophageal reflux disease with esophagitis without hemorrhage Continue PPI as already ordered, this medication has been reviewed and there are no changes at this time.  Avoidence of caffeine and alcohol  Moderate elevation of the head of the bed      [name], MD  07/19/2021 10:37 AM

## 2021-07-19 NOTE — H&P (View-Only) (Signed)
MRN : [id number]  Cory [name] is a 66 y.o. (17-Jan-1956) male who presents with chief complaint of swollen right arm.  History of Present Illness:   I am asked to evaluate the patient by Dr. [name].  Patient is a 66 year old gentleman with a history of stage IV esophageal cancer who is been undergoing treatment for a while.  Approximately 7 to 10 days ago he noted a rather significant increase in swelling of his right arm.  Although it is not associated with significant pain redness or fever chills it is severe enough that he can no longer close his hand.  A CT scan with a venous phase was obtained and I have personally reviewed the scan.  It does appear to me that there are an increased number of collaterals around the shoulder and there is an area of the mid subclavian that appears to be narrowed.  Furthermore, the right internal jugular appears to be larger than the left which would also support increased collaterals from a subclavian lesion.  Also noted by the CT scan was lymphadenopathy suggesting metastatic infiltration and this would also increase the upper extremity lymphedema  Current Meds  Medication Sig   dexamethasone (DECADRON) 2 MG tablet Take 1 tablet (2 mg total) by mouth daily. With food and in am   fentaNYL (DURAGESIC) 12 MCG/HR Place 1 patch onto the skin every 3 (three) days.   lidocaine-prilocaine (EMLA) cream Apply 1 application topically as needed. Apply small amount to port site at least 1 hour prior to it being accessed, cover with plastic wrap   LORazepam (ATIVAN) 0.5 MG tablet Take 1 tablet (0.5 mg total) by mouth every 6 (six) hours as needed (Nausea or vomiting).   ondansetron (ZOFRAN) 8 MG tablet Take 1 tablet (8 mg total) by mouth 2 (two) times daily as needed for refractory nausea / vomiting. Start on day 3 after chemotherapy.   Oxycodone HCl 10 MG TABS Take 1 tablet (10 mg total) by mouth every 4 (four) hours as needed.   pantoprazole (PROTONIX) 40 MG tablet  TAKE 1 TABLET BY MOUTH TWICE A DAY BEFORE MEALS   predniSONE (DELTASONE) 10 MG tablet Take 1 tablet (10 mg total) by mouth daily with breakfast. Take 6 tablets x 2 days, then 5 tablet x 2 days, then 4 tablets x 2 days, 3 tablets x 2 days, 2 tablets x 2 days, then 1 tablet x 1 day and make sure to eat food before taking med. Take med every morning   prochlorperazine (COMPAZINE) 10 MG tablet Take 1 tablet (10 mg total) by mouth every 6 (six) hours as needed (Nausea or vomiting).   triamcinolone cream (KENALOG) 0.1 % Apply 1 application topically 3 (three) times daily as needed.    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   Family history of brain cancer    Family history of colon cancer    Family history of melanoma    Family history of stomach cancer    GERD (gastroesophageal reflux disease)    History of kidney stones    Nephrolithiasis    [facility]   Pancreatitis, acute    Personal history of colonic polyps    Dr Nicolasa Ducking   Psoriasis     Past Surgical History:  Procedure Laterality Date   COLONOSCOPY     COLONOSCOPY WITH PROPOFOL N/A 03/31/2020   Procedure: COLONOSCOPY WITH PROPOFOL;  Surgeon: Jonathon Bellows, MD;  Location: [facility] ENDOSCOPY;  Service:  Gastroenterology;  Laterality: N/A;   ERCP     ESOPHAGOGASTRODUODENOSCOPY (EGD) WITH PROPOFOL N/A 06/19/2020   Procedure: ESOPHAGOGASTRODUODENOSCOPY (EGD) WITH PROPOFOL;  Surgeon: Jonathon Bellows, MD;  Location: [facility] ENDOSCOPY;  Service: Gastroenterology;  Laterality: N/A;   groin surgery     as a child   PORTA CATH INSERTION N/A 06/28/2020   Procedure: PORTA CATH INSERTION;  Surgeon: Algernon Huxley, MD;  Location: [facility] LAB;  Service: Cardiovascular;  Laterality: N/A;   SHOULDER SURGERY      Social History Social History   Tobacco Use   Smoking status: Former    Types: Cigarettes    Quit date: 07/08/1988    Years since quitting: 33.0   Smokeless tobacco: Never  Vaping Use   Vaping Use:  Never used  Substance Use Topics   Alcohol use: No    Comment: recovering alcoholic for 13 years   Drug use: No    Family History Family History  Problem Relation Age of Onset   Stomach cancer Mother    Diabetes Brother    Hypertension Brother    Hypertension Father    Prostate cancer Father    Colon cancer Sister    Cancer Other        either cervical or ovarian, d. 33s   Brain cancer Sister     No Known Allergies   REVIEW OF SYSTEMS (Negative unless checked)  Constitutional: [] Weight loss  [] Fever  [] Chills Cardiac: [] Chest pain   [] Chest pressure   [] Palpitations   [] Shortness of breath when laying flat   [] Shortness of breath with exertion. Vascular:  [] Pain in legs with walking   [] Pain in legs at rest  [] History of DVT   [] Phlebitis   [x] Swelling in legs   [] Varicose veins   [] Non-healing ulcers Pulmonary:   [] Uses home oxygen   [] Productive cough   [] Hemoptysis   [] Wheeze  [] COPD   [] Asthma Neurologic:  [] Dizziness   [] Seizures   [] History of stroke   [] History of TIA  [] Aphasia   [] Vissual changes   [x] Weakness or numbness in arm   [] Weakness or numbness in leg Musculoskeletal:   [] Joint swelling   [] Joint pain   [] Low back pain Hematologic:  [] Easy bruising  [] Easy bleeding   [] Hypercoagulable state   [] Anemic Gastrointestinal:  [] Diarrhea   [] Vomiting  [x] Gastroesophageal reflux/heartburn   [] Difficulty swallowing. Genitourinary:  [] Chronic kidney disease   [] Difficult urination  [] Frequent urination   [] Blood in urine Skin:  [] Rashes   [] Ulcers  Psychological:  [] History of anxiety   []  History of major depression.  Physical Examination  Vitals:   07/19/21 1007  BP: (!) 163/77  Pulse: 80  Resp: 16  Weight: 202 lb 9.6 oz (91.9 kg)   Body mass index is 28.26 kg/m. Gen: WD/WN, NAD Head: [place]/AT, No temporalis wasting.  Ear/Nose/Throat: Hearing grossly intact, nares w/o erythema or drainage, pinna without lesions Eyes: PER, EOMI, sclera nonicteric.  Neck:  Supple, no gross masses.  No JVD.  Pulmonary:  Good air movement, no audible wheezing, no use of accessory muscles.  Cardiac: RRR, precordium not hyperdynamic. Vascular:  4+ soft pitting edema of the entire arm Vessel Right Left  Radial Palpable Palpable  Gastrointestinal: soft, non-distended. No guarding/no peritoneal signs.  Musculoskeletal: M/S 5/5 throughout.  No deformity.  Neurologic: CN 2-12 intact. Pain and light touch intact in extremities.  Symmetrical.  Speech is fluent. Motor exam as listed above. Psychiatric: Judgment intact, Mood & affect appropriate for pt's clinical  situation. Dermatologic: Venous rashes no ulcers noted.  No changes consistent with cellulitis. Lymph : No lichenification or skin changes of chronic lymphedema.  CBC Lab Results  Component Value Date   WBC 6.8 07/18/2021   HGB 11.6 (L) 07/18/2021   HCT 35.8 (L) 07/18/2021   MCV 89.9 07/18/2021   PLT 134 (L) 07/18/2021    BMET    Component Value Date/Time   NA 134 (L) 07/18/2021 0926   K 3.5 07/18/2021 0926   CL 102 07/18/2021 0926   CO2 24 07/18/2021 0926   GLUCOSE 183 (H) 07/18/2021 0926   BUN 10 07/18/2021 0926   CREATININE 0.87 07/18/2021 0926   CALCIUM 8.6 (L) 07/18/2021 0926   GFRNONAA >60 07/18/2021 0926   Estimated Creatinine Clearance: 96.8 mL/min (by C-G formula based on SCr of 0.87 mg/dL).  COAG No results found for: INR, PROTIME  Radiology MR Brain W Wo Contrast  Result Date: 06/24/2021 CLINICAL DATA:  History of esophageal cancer with brain metastases. EXAM: MRI HEAD WITHOUT AND WITH CONTRAST TECHNIQUE: Multiplanar, multiecho pulse sequences of the brain and surrounding structures were obtained without and with intravenous contrast. CONTRAST:  46mL GADAVIST GADOBUTROL 1 MMOL/ML IV SOLN COMPARISON:  05/16/2021 FINDINGS: Brain: 40 x 34 by 30 mm dural based mass at the junction of the tentorium and falx with growth above and below the tentorium and along the right proximal transverse  sinus. The superior sagittal sinus is chronically occluded above the mass which effaces the torcula. Subdural collection with blood products along the left cerebral convexity has progressed in size, now 12 mm. Midline shift measures 4 mm. Progressive edema now in the bilateral occipital lobes with marginal hemorrhage along the left aspect of the mass parenchymal interface showing methemoglobin (subacute features) and measuring up to 17 x 8 mm on axial slices. Vascular: Dural sinus occlusion as above. Skull and upper cervical spine: No visible bone invasion or bony metastasis. Sinuses/Orbits: Intrinsically T1 hyperintense mass in the superior right orbit which becomes brighter on the postcontrast phase and has increased in size, dimensions now 13 mm anterior to posterior on sagittal images. These results will be called to the ordering clinician or representative by the Radiologist Assistant, and communication documented in the PACS or [employer]. IMPRESSION: 1. Rapid growth of the dural based mass centered at the torcula, now up to 4 cm and causing bilateral occipital edema. The is avidly enhancing and there is a subacute hemorrhage at the parenchymal mass interface at the left occiput. 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 2. Increased subdural hematoma along the left cerebral convexity measuring up to 12 mm in thickness and causing 4 mm of midline shift. 3. 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 Electronically Signed   By: Jorje Guild M.D.   On: 06/24/2021 09:17   US Venous Img Upper Uni Right(DVT)  Result Date: 07/17/2021 CLINICAL DATA:  Right arm swelling, pain EXAM: RIGHT UPPER EXTREMITY VENOUS DOPPLER ULTRASOUND TECHNIQUE: Gray-scale sonography with graded compression, as well as color Doppler and duplex ultrasound were performed to evaluate the  upper extremity deep venous system from the level of the subclavian vein and including the jugular, axillary, basilic, radial, ulnar and upper cephalic vein. Spectral Doppler was utilized to evaluate flow at rest and with distal augmentation maneuvers. COMPARISON:  None. FINDINGS: Contralateral Subclavian Vein: Respiratory phasicity is normal and symmetric with the symptomatic  side. No evidence of thrombus. Normal compressibility. Internal Jugular Vein: No evidence of thrombus. Normal compressibility, respiratory phasicity and response to augmentation. Subclavian Vein: No evidence of thrombus. Normal compressibility, respiratory phasicity and response to augmentation. Axillary Vein: No evidence of thrombus. Normal compressibility, respiratory phasicity and response to augmentation. Cephalic Vein: No evidence of thrombus. Normal compressibility, respiratory phasicity and response to augmentation. Basilic Vein: No evidence of thrombus. Normal compressibility, respiratory phasicity and response to augmentation. Brachial Veins: No evidence of thrombus. Normal compressibility, respiratory phasicity and response to augmentation. Radial Veins: No evidence of thrombus. Normal compressibility, respiratory phasicity and response to augmentation. Ulnar Veins: No evidence of thrombus. Normal compressibility, respiratory phasicity and response to augmentation. IMPRESSION: No evidence of DVT within the right upper extremity. Electronically Signed   By: Jerilynn Mages.  Shick M.D.   On: 07/17/2021 16:20     Assessment/Plan 1. Lymphedema Given the severity of the patient's lymphedema and the fact that it is inhibiting daily activities I believe further investigation is warranted.  I believe the CT scan suggests there is a narrowing of the subclavian vein which is very likely secondary to external compression by tumor burden.  I recommended venography of the right upper extremity with the hope for intervention and possible stent  placement.  Risk and benefits were reviewed with the patient all questions been answered patient agreed to proceed  2. Swelling of arm See #1  3. Esophageal adenocarcinoma ([place]) Plan per Dr. [name]  4. Gastroesophageal reflux disease with esophagitis without hemorrhage Continue PPI as already ordered, this medication has been reviewed and there are no changes at this time.  Avoidence of caffeine and alcohol  Moderate elevation of the head of the bed      [name], MD  07/19/2021 10:37 AM

## 2021-07-20 ENCOUNTER — Encounter (INDEPENDENT_AMBULATORY_CARE_PROVIDER_SITE_OTHER): Payer: Self-pay | Admitting: Vascular Surgery

## 2021-07-20 ENCOUNTER — Inpatient Hospital Stay: Payer: Medicare Other

## 2021-07-20 DIAGNOSIS — I890 Lymphedema, not elsewhere classified: Secondary | ICD-10-CM | POA: Insufficient documentation

## 2021-07-20 NOTE — Telephone Encounter (Signed)
Spoke with the patient and he is scheduled with Dr. Delana Meyer for a right arm venogram on 07/24/21 with a 11:15 am arrival time to the MM. Pre-procedure instructions were discussed and will be mailed.

## 2021-07-24 ENCOUNTER — Inpatient Hospital Stay: Payer: Medicare Other | Admitting: Oncology

## 2021-07-24 ENCOUNTER — Ambulatory Visit
Admission: RE | Admit: 2021-07-24 | Discharge: 2021-07-24 | Disposition: A | Discharge status: Home / Self Care | Payer: Medicare Other | Source: Ambulatory Visit | Attending: Vascular Surgery | Admitting: Vascular Surgery

## 2021-07-24 ENCOUNTER — Encounter: Payer: Self-pay | Admitting: Vascular Surgery

## 2021-07-24 ENCOUNTER — Other Ambulatory Visit (INDEPENDENT_AMBULATORY_CARE_PROVIDER_SITE_OTHER): Payer: Self-pay | Admitting: Nurse Practitioner

## 2021-07-24 ENCOUNTER — Telehealth: Payer: Self-pay

## 2021-07-24 ENCOUNTER — Other Ambulatory Visit: Payer: Self-pay | Admitting: *Deleted

## 2021-07-24 ENCOUNTER — Encounter
Admission: RE | Disposition: A | Discharge status: Home / Self Care | Payer: Self-pay | Source: Ambulatory Visit | Attending: Vascular Surgery

## 2021-07-24 DIAGNOSIS — C159 Malignant neoplasm of esophagus, unspecified: Secondary | ICD-10-CM | POA: Diagnosis not present

## 2021-07-24 DIAGNOSIS — M7989 Other specified soft tissue disorders: Secondary | ICD-10-CM

## 2021-07-24 DIAGNOSIS — K2100 Gastro-esophageal reflux disease with esophagitis, without bleeding: Secondary | ICD-10-CM | POA: Diagnosis not present

## 2021-07-24 DIAGNOSIS — I890 Lymphedema, not elsewhere classified: Secondary | ICD-10-CM | POA: Insufficient documentation

## 2021-07-24 DIAGNOSIS — S42301G Unspecified fracture of shaft of humerus, right arm, subsequent encounter for fracture with delayed healing: Secondary | ICD-10-CM

## 2021-07-24 DIAGNOSIS — R600 Localized edema: Secondary | ICD-10-CM | POA: Diagnosis not present

## 2021-07-24 HISTORY — PX: UPPER EXTREMITY VENOGRAPHY: CATH118272

## 2021-07-24 SURGERY — UPPER EXTREMITY VENOGRAPHY
Anesthesia: Moderate Sedation | Laterality: Right

## 2021-07-24 MED ORDER — HEPARIN SODIUM (PORCINE) 1000 UNIT/ML IJ SOLN
INTRAMUSCULAR | Status: AC
  Filled 2021-07-24: qty 10

## 2021-07-24 MED ORDER — FENTANYL CITRATE (PF) 100 MCG/2ML IJ SOLN
INTRAMUSCULAR | Status: AC
  Filled 2021-07-24: qty 2

## 2021-07-24 MED ORDER — CEFAZOLIN SODIUM-DEXTROSE 2-4 GM/100ML-% IV SOLN
2.0000 g | Freq: Once | INTRAVENOUS | Status: AC
  Administered 2021-07-24: 2 g via INTRAVENOUS

## 2021-07-24 MED ORDER — ONDANSETRON HCL 4 MG/2ML IJ SOLN: 4.0000 mg | Freq: Four times a day (QID) | INTRAMUSCULAR | Status: DC | PRN

## 2021-07-24 MED ORDER — MIDAZOLAM HCL 2 MG/2ML IJ SOLN
INTRAMUSCULAR | Status: DC | PRN
  Administered 2021-07-24: 2 mg via INTRAVENOUS

## 2021-07-24 MED ORDER — IODIXANOL 320 MG/ML IV SOLN
INTRAVENOUS | Status: DC | PRN
  Administered 2021-07-24: 40 mL via INTRAVENOUS

## 2021-07-24 MED ORDER — HYDROMORPHONE HCL 1 MG/ML IJ SOLN: 1.0000 mg | Freq: Once | INTRAMUSCULAR | Status: DC | PRN

## 2021-07-24 MED ORDER — FAMOTIDINE 20 MG PO TABS: 40.0000 mg | ORAL_TABLET | Freq: Once | ORAL | Status: DC | PRN

## 2021-07-24 MED ORDER — FENTANYL CITRATE (PF) 100 MCG/2ML IJ SOLN
INTRAMUSCULAR | Status: DC | PRN
  Administered 2021-07-24: 50 ug via INTRAVENOUS

## 2021-07-24 MED ORDER — HEPARIN SOD (PORK) LOCK FLUSH 100 UNIT/ML IV SOLN
INTRAVENOUS | Status: AC
  Filled 2021-07-24: qty 5

## 2021-07-24 MED ORDER — MIDAZOLAM HCL 2 MG/2ML IJ SOLN
INTRAMUSCULAR | Status: AC
  Filled 2021-07-24: qty 4

## 2021-07-24 MED ORDER — MIDAZOLAM HCL 2 MG/ML PO SYRP: 8.0000 mg | ORAL_SOLUTION | Freq: Once | ORAL | Status: DC | PRN

## 2021-07-24 MED ORDER — DIPHENHYDRAMINE HCL 50 MG/ML IJ SOLN: 50.0000 mg | Freq: Once | INTRAMUSCULAR | Status: DC | PRN

## 2021-07-24 MED ORDER — METHYLPREDNISOLONE SODIUM SUCC 125 MG IJ SOLR: 125.0000 mg | Freq: Once | INTRAMUSCULAR | Status: DC | PRN

## 2021-07-24 MED ORDER — SODIUM CHLORIDE 0.9 % IV SOLN: INTRAVENOUS | Status: DC

## 2021-07-24 MED ORDER — HEPARIN SOD (PORK) LOCK FLUSH 100 UNIT/ML IV SOLN
500.0000 [IU] | Freq: Once | INTRAVENOUS | Status: AC
  Administered 2021-07-24: 500 [IU] via INTRAVENOUS

## 2021-07-24 SURGICAL SUPPLY — 11 items
CANNULA 5F STIFF (CANNULA) ×1 IMPLANT
CATH BEACON 5 .035 40 KMP TP (CATHETERS) IMPLANT
CATH BEACON 5 .038 40 KMP TP (CATHETERS) ×2
COVER PROBE U/S 5X48 (MISCELLANEOUS) ×1 IMPLANT
DEVICE TORQUE .025-.038 (MISCELLANEOUS) ×1 IMPLANT
DRAPE BRACHIAL (DRAPES) ×1 IMPLANT
GUIDEWIRE ANGLED .035 180CM (WIRE) ×1 IMPLANT
PACK ANGIOGRAPHY (CUSTOM PROCEDURE TRAY) ×1 IMPLANT
SHEATH BRITE TIP 6FRX11 (SHEATH) ×1 IMPLANT
SHEATH BRITE TIP 6FRX5.5 (SHEATH) ×1 IMPLANT
SUT MNCRL AB 4-0 PS2 18 (SUTURE) ×1 IMPLANT

## 2021-07-24 NOTE — Telephone Encounter (Signed)
Call placed to [facility], Dr. Harlow Mares regarding right arm swelling, humeral fracture and message from Dr. Delana Meyer. Message left and awaiting call back from medical staff.

## 2021-07-24 NOTE — Progress Notes (Signed)
Dr. Delana Meyer at bedside, speaking with pt. And his son Harrie Jeans re: procedural results. Both verbalize understanding of conversation.

## 2021-07-24 NOTE — Op Note (Signed)
[facility]  Percutaneous Study/Intervention Procedural Note   Date of Surgery: 07/24/2021,10:13 AM  Surgeon:Dimas Scheck, Dolores Lory   Pre-operative Diagnosis: Massive edema of the right arm.  Post-operative diagnosis:  Same  Procedure(s) Performed:  1.  Ultrasound-guided access to the right brachial vein  2.  Introduction catheter into superior vena cava right arm approach  3.  Right upper extremity venogram  4.  Superior venacavogram  Anesthesia: Conscious sedation was administered under my direct supervision by the interventional radiology RN. IV Versed plus fentanyl were utilized. Continuous ECG, pulse oximetry and blood pressure was monitored throughout the entire procedure. Conscious sedation was administered for a total of 31 minutes.  Sheath: 6 French 11 cm Pinnacle sheath antegrade right basilic vein  Contrast: 40 cc   Fluoroscopy Time: 5.4 minutes  Indications:  Patient presents for evaluation for massive edema of his right arm.  He is undergoing venogram to evaluate for the possibility of venous outflow obstruction which could be intervened upon.  The risks and benefits of been reviewed all questions answered patient agrees to proceed.  Procedure:  MILFORD CILENTO a 66 y.o. male who was identified and appropriate procedural time out was performed.  The patient was then placed supine on the table and prepped and draped in the usual sterile fashion.  Ultrasound was used to evaluate the right basilic vein.  It was patent .  A digital ultrasound image was acquired.  A micro-puncture needle was used to access the right basilic vein under direct ultrasound guidance and a permanent image was saved for the record.  The microwire was then advanced under fluoroscopic guidance followed by micro-sheath.  J-wire was then advanced followed by an 11 cm 6 French Pinnacle sheath.  Hand injection of contrast was then performed to demonstrate the venous anatomy of the right  upper arm.  The central images were not adequate and therefore a floppy Glidewire was advanced through the 6 [place] sheath with a Kumpe catheter and the Kumpe catheter was negotiated into the central venous system. The micro-sheath was removed and a Kumpe catheter advanced over the wire. Wire was then removed and with the catheter positioned in the central venous anatomy and the superior vena cava further imaging was obtained by hand injection.   Findings:    Right upper extremity: The right basilic vein is widely patent from the level of the elbow proximally.  There are very few communicating branches in the distal and mid upper arm.  More proximally there is a large communicating branch which appears to fill the proximal brachial veins.  This is proximal to his fracture and is suggestive of damage to the brachial veins at the level of the fracture.  However from the level of the humeral head venous outflow is normal in appearance.  The axillary, subclavian, innominate vein and superior vena cava are widely patent.  Summary: Although there may be some damage to the deep venous system in the upper arm the venography suggests fairly good outflow without central venous obstruction and therefore the degree of edema he has would not be explained by this study.     Disposition: Patient was taken to the recovery room in stable condition having tolerated the procedure well.  Belenda Cruise Earnie Rockhold 07/24/2021,10:13 AM

## 2021-07-24 NOTE — Interval H&P Note (Signed)
History and Physical Interval Note:  07/24/2021 8:49 AM  Cory [name]  has presented today for surgery, with the diagnosis of RT Arm Venogram poss Intervention   Swelling of Arm.  The various methods of treatment have been discussed with the patient and family. After consideration of risks, benefits and other options for treatment, the patient has consented to  Procedure(s): UPPER EXTREMITY VENOGRAPHY (Right) as a surgical intervention.  The patient's history has been reviewed, patient examined, no change in status, stable for surgery.  I have reviewed the patient's chart and labs.  Questions were answered to the patient's satisfaction.     [name]

## 2021-07-25 ENCOUNTER — Ambulatory Visit
Admission: RE | Admit: 2021-07-25 | Discharge: 2021-07-25 | Disposition: A | Discharge status: Home / Self Care | Payer: Medicare Other | Source: Ambulatory Visit | Attending: Oncology | Admitting: Oncology

## 2021-07-25 DIAGNOSIS — Z0189 Encounter for other specified special examinations: Secondary | ICD-10-CM

## 2021-07-25 DIAGNOSIS — Z5111 Encounter for antineoplastic chemotherapy: Secondary | ICD-10-CM

## 2021-07-25 DIAGNOSIS — Z5112 Encounter for antineoplastic immunotherapy: Secondary | ICD-10-CM | POA: Insufficient documentation

## 2021-07-25 DIAGNOSIS — I10 Essential (primary) hypertension: Secondary | ICD-10-CM | POA: Diagnosis not present

## 2021-07-25 DIAGNOSIS — C159 Malignant neoplasm of esophagus, unspecified: Secondary | ICD-10-CM | POA: Diagnosis not present

## 2021-07-25 DIAGNOSIS — C160 Malignant neoplasm of cardia: Secondary | ICD-10-CM | POA: Diagnosis not present

## 2021-07-25 DIAGNOSIS — I340 Nonrheumatic mitral (valve) insufficiency: Secondary | ICD-10-CM | POA: Insufficient documentation

## 2021-07-25 DIAGNOSIS — Z5181 Encounter for therapeutic drug level monitoring: Secondary | ICD-10-CM | POA: Diagnosis not present

## 2021-07-25 DIAGNOSIS — C799 Secondary malignant neoplasm of unspecified site: Secondary | ICD-10-CM | POA: Insufficient documentation

## 2021-07-25 DIAGNOSIS — C7889 Secondary malignant neoplasm of other digestive organs: Secondary | ICD-10-CM

## 2021-07-25 LAB — ECHOCARDIOGRAM COMPLETE
Area-P 1/2: 3.23 cm2
Calc EF: 51.7 %
MV VTI: 2.07 cm2
S' Lateral: 4.1 cm
Single Plane A2C EF: 51.2 %
Single Plane A4C EF: 51.7 %

## 2021-07-25 NOTE — Progress Notes (Signed)
*  PRELIMINARY RESULTS* Echocardiogram 2D Echocardiogram has been performed.  Cory [name] 07/25/2021, 9:49 AM

## 2021-07-27 ENCOUNTER — Other Ambulatory Visit: Payer: Self-pay

## 2021-07-27 ENCOUNTER — Inpatient Hospital Stay (HOSPITAL_BASED_OUTPATIENT_CLINIC_OR_DEPARTMENT_OTHER): Payer: Medicare Other | Admitting: Oncology

## 2021-07-27 ENCOUNTER — Encounter: Payer: Self-pay | Admitting: Oncology

## 2021-07-27 VITALS — BP 159/74 | HR 78 | Temp 98.1°F | Resp 17 | Wt 198.0 lb

## 2021-07-27 DIAGNOSIS — C159 Malignant neoplasm of esophagus, unspecified: Secondary | ICD-10-CM

## 2021-07-27 DIAGNOSIS — Z7189 Other specified counseling: Secondary | ICD-10-CM | POA: Diagnosis not present

## 2021-07-27 DIAGNOSIS — C155 Malignant neoplasm of lower third of esophagus: Secondary | ICD-10-CM | POA: Diagnosis not present

## 2021-07-27 MED ORDER — DEXAMETHASONE 4 MG PO TABS: 4.0000 mg | ORAL_TABLET | Freq: Two times a day (BID) | ORAL | 0 refills | Status: DC

## 2021-07-27 NOTE — Progress Notes (Signed)
Patient here for oncology follow-up appointment, concerns of blurry vision and arm swelling from broken arm with fall

## 2021-07-27 NOTE — Progress Notes (Addendum)
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  He underwent EGD on 06/20/2020 By Dr. Vicente Males which showed a large nearly obstructing mass in the lower third of the esophagus 35 cm from incisors.  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  PD-l1 <1 %, TMB HIGH    Presently patient reports dysphagia but is able to eat cereal or soft food.  He has lost about 13 pounds in the last month itself.  Denies any significant pain.  He recently retired after many years of service and is otherwise independent of his ADLs and IADLs.     PET CT scan on 06/26/2020 showed circumferential distal esophageal mass with an SUV of 13.3 extending over 5.5 cm. Right lower paratracheal node is 0.6 cm with an SUV of 3.1. Hypermetabolic right gastric lymph nodes including 1.1 cm node with an SUV of 5.8. Numerous hypermetabolic lesions throughout the liver somewhat confluent around the periphery which were hypermetabolic between 8 and 9. Faintly hypermetabolic left adrenal gland   NGS testing showed high tumor mutational burden CPS score less than 1. CD9-NRG1 fusion. BRAF 581L, CCN E1 gain.  EMLA for fusion of BX [id number], FGF 23 gain, FGF 6 gain, MET R988C, T p53 S1 30F. ERBB2 gain but no other actionable mutations   Patient currently on first-line  FOLFOX/trastuzumab/Keytruda.  He gets Keytruda every 6 weeks  Patient noted to haveEnlarging duodenal based mass measuring 2 x 2.5 cm in size back in November 2022 dural based metastases versus meningioma were considered to be primary considerations.  Case discussed with neurosurgery and this spot was thought to be difficult to biopsy.    MRI brain in December 2022 showed significant enlargement of this mass to 4 cm with associated edema in the bilateral occipital lobes and marginal hemorrhage along the left aspect of the mass.  Increased subdural hematoma along the left cerebral convexity.  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  Patient received Byng radiation since single fraction to this mass.  PET scan inNovember 2022 showed interval resolution of activity in the esophagus as well as multifocal hepatic metastases and gastrohepatic lymph node.  No evidence of metastatic mediastinal lymph nodes but a small new right lower lobe lung nodule without metabolic activity was seen.  Patient therefore continued 5-FU trastuzumab Keytruda with last cycle given on 07/04/2021.   EchocardiogramIn November 2022 showed EF of 50 to 55% with low normal ejection fraction but no other abnormalities  Patient noted to have significant swelling in his right upper extremity.  CT venogram did not show any vascular stenosis and patient was seen by vascular surgery.  Swelling has been attributed secondary to his fracture that he has had in the past following a fall which has not been operated upon and was allowed to heal by secondary intent  Interval history-patient currently complains of continued pain and swelling in  Hematology/Oncology Consult note [facility]  [phone] [phone]  Patient Care Team: Venia Carbon, MD as PCP - General [name], RN as Oncology Nurse Navigator [name], MD as Consulting Physician (Hematology and Oncology)   Name of the patient: Cory [name]  [id number]  [birth year]   Date of visit: 07/27/21  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  He underwent EGD on 06/20/2020 By Dr. Vicente Males which showed a large nearly obstructing mass in the lower third of the esophagus 35 cm from incisors.  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  PD-l1 <1 %, TMB HIGH    Presently patient reports dysphagia but is able to eat cereal or soft food.  He has lost about 13 pounds in the last month itself.  Denies any significant pain.  He recently retired after many years of service and is otherwise independent of his ADLs and IADLs.     PET CT scan on 06/26/2020 showed circumferential distal esophageal mass with an SUV of 13.3 extending over 5.5 cm. Right lower paratracheal node is 0.6 cm with an SUV of 3.1. Hypermetabolic right gastric lymph nodes including 1.1 cm node with an SUV of 5.8. Numerous hypermetabolic lesions throughout the liver somewhat confluent around the periphery which were hypermetabolic between 8 and 9. Faintly hypermetabolic left adrenal gland   NGS testing showed high tumor mutational burden CPS score less than 1. CD9-NRG1 fusion. BRAF 581L, CCN E1 gain.  EMLA for fusion of BX [id number], FGF 23 gain, FGF 6 gain, MET R988C, T p53 S1 30F. ERBB2 gain but no other actionable mutations   Patient currently on first-line  FOLFOX/trastuzumab/Keytruda.  He gets Keytruda every 6 weeks  Patient noted to haveEnlarging duodenal based mass measuring 2 x 2.5 cm in size back in November 2022 dural based metastases versus meningioma were considered to be primary considerations.  Case discussed with neurosurgery and this spot was thought to be difficult to biopsy.    MRI brain in December 2022 showed significant enlargement of this mass to 4 cm with associated edema in the bilateral occipital lobes and marginal hemorrhage along the left aspect of the mass.  Increased subdural hematoma along the left cerebral convexity.  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  Patient received Byng radiation since single fraction to this mass.  PET scan inNovember 2022 showed interval resolution of activity in the esophagus as well as multifocal hepatic metastases and gastrohepatic lymph node.  No evidence of metastatic mediastinal lymph nodes but a small new right lower lobe lung nodule without metabolic activity was seen.  Patient therefore continued 5-FU trastuzumab Keytruda with last cycle given on 07/04/2021.   EchocardiogramIn November 2022 showed EF of 50 to 55% with low normal ejection fraction but no other abnormalities  Patient noted to have significant swelling in his right upper extremity.  CT venogram did not show any vascular stenosis and patient was seen by vascular surgery.  Swelling has been attributed secondary to his fracture that he has had in the past following a fall which has not been operated upon and was allowed to heal by secondary intent  Interval history-patient currently complains of continued pain and swelling in  Hematology/Oncology Consult note [facility]  [phone] [phone]  Patient Care Team: Venia Carbon, MD as PCP - General [name], RN as Oncology Nurse Navigator [name], MD as Consulting Physician (Hematology and Oncology)   Name of the patient: Cory [name]  [id number]  [birth year]   Date of visit: 07/27/21  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  He underwent EGD on 06/20/2020 By Dr. Vicente Males which showed a large nearly obstructing mass in the lower third of the esophagus 35 cm from incisors.  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  PD-l1 <1 %, TMB HIGH    Presently patient reports dysphagia but is able to eat cereal or soft food.  He has lost about 13 pounds in the last month itself.  Denies any significant pain.  He recently retired after many years of service and is otherwise independent of his ADLs and IADLs.     PET CT scan on 06/26/2020 showed circumferential distal esophageal mass with an SUV of 13.3 extending over 5.5 cm. Right lower paratracheal node is 0.6 cm with an SUV of 3.1. Hypermetabolic right gastric lymph nodes including 1.1 cm node with an SUV of 5.8. Numerous hypermetabolic lesions throughout the liver somewhat confluent around the periphery which were hypermetabolic between 8 and 9. Faintly hypermetabolic left adrenal gland   NGS testing showed high tumor mutational burden CPS score less than 1. CD9-NRG1 fusion. BRAF 581L, CCN E1 gain.  EMLA for fusion of BX [id number], FGF 23 gain, FGF 6 gain, MET R988C, T p53 S1 30F. ERBB2 gain but no other actionable mutations   Patient currently on first-line  FOLFOX/trastuzumab/Keytruda.  He gets Keytruda every 6 weeks  Patient noted to haveEnlarging duodenal based mass measuring 2 x 2.5 cm in size back in November 2022 dural based metastases versus meningioma were considered to be primary considerations.  Case discussed with neurosurgery and this spot was thought to be difficult to biopsy.    MRI brain in December 2022 showed significant enlargement of this mass to 4 cm with associated edema in the bilateral occipital lobes and marginal hemorrhage along the left aspect of the mass.  Increased subdural hematoma along the left cerebral convexity.  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  Patient received Byng radiation since single fraction to this mass.  PET scan inNovember 2022 showed interval resolution of activity in the esophagus as well as multifocal hepatic metastases and gastrohepatic lymph node.  No evidence of metastatic mediastinal lymph nodes but a small new right lower lobe lung nodule without metabolic activity was seen.  Patient therefore continued 5-FU trastuzumab Keytruda with last cycle given on 07/04/2021.   EchocardiogramIn November 2022 showed EF of 50 to 55% with low normal ejection fraction but no other abnormalities  Patient noted to have significant swelling in his right upper extremity.  CT venogram did not show any vascular stenosis and patient was seen by vascular surgery.  Swelling has been attributed secondary to his fracture that he has had in the past following a fall which has not been operated upon and was allowed to heal by secondary intent  Interval history-patient currently complains of continued pain and swelling in  central venous thrombosis. 4. Incompletely imaged, significant soft tissue fullness surrounding the right shoulder in the setting of prior comminuted fracture. Consider dedicated imaging (contrast enhanced CT or MRI) and clinical correlation to exclude pathologic fracture with soft tissue component. 5. Right axillary developing adenopathy which could be reactive or metastatic given the appearance of the incompletely imaged right shoulder. 6. Trace bilateral pleural effusions, similar. 7. Aortic atherosclerosis (ICD10-I70.0) and emphysema (ICD10-J43.9). 8. Nonspecific enlarging anterior left chest wall nodule for which metastatic disease is possible. Recommend attention on follow-up. 9. Suspect developing osseous metastasis within the anterior right fifth rib. CT ABDOMEN AND PELVIS IMPRESSION 1. Progressive hepatic metastasis. 2. Cirrhosis and mild splenomegaly. 3. Right nephrolithiasis. Electronically Signed   By: Abigail Miyamoto M.D.   On: 07/19/2021 16:58   PERIPHERAL VASCULAR CATHETERIZATION  Result Date: 07/24/2021 See surgical note for result.  US Venous Img Upper Uni Right(DVT)  Result Date: 07/17/2021 CLINICAL DATA:  Right arm swelling, pain EXAM: RIGHT UPPER EXTREMITY VENOUS DOPPLER ULTRASOUND TECHNIQUE: Gray-scale sonography with graded compression, as well as color Doppler and duplex ultrasound were performed to evaluate the upper extremity deep venous system from the level of the subclavian vein and including the jugular, axillary, basilic, radial, ulnar and upper cephalic vein. Spectral Doppler was utilized to evaluate flow at rest and with distal augmentation maneuvers. COMPARISON:  None. FINDINGS: Contralateral Subclavian Vein: Respiratory phasicity is normal and symmetric with the symptomatic side. No evidence of thrombus. Normal compressibility. Internal Jugular Vein: No evidence of thrombus. Normal compressibility,  respiratory phasicity and response to augmentation. Subclavian Vein: No evidence of thrombus. Normal compressibility, respiratory phasicity and response to augmentation. Axillary Vein: No evidence of thrombus. Normal compressibility, respiratory phasicity and response to augmentation. Cephalic Vein: No evidence of thrombus. Normal compressibility, respiratory phasicity and response to augmentation. Basilic Vein: No evidence of thrombus. Normal compressibility, respiratory phasicity and response to augmentation. Brachial Veins: No evidence of thrombus. Normal compressibility, respiratory phasicity and response to augmentation. Radial Veins: No evidence of thrombus. Normal compressibility, respiratory phasicity and response to augmentation. Ulnar Veins: No evidence of thrombus. Normal compressibility, respiratory phasicity and response to augmentation. IMPRESSION: No evidence of DVT within the right upper extremity. Electronically Signed   By: Jerilynn Mages.  Shick M.D.   On: 07/17/2021 16:20   ECHOCARDIOGRAM COMPLETE  Result Date: 07/25/2021    ECHOCARDIOGRAM REPORT   Patient Name:   RONI SCOW Date of Exam: 07/25/2021 Medical Rec #:  [id number]      Height:       71.0 in Accession #:    [id number]     Weight:       202.6 lb Date of Birth:  1956-04-17     BSA:          2.120 m Patient Age:    33 years       BP:           142/78 mmHg Patient Gender: M              HR:           80 bpm. Exam Location:  [facility] Procedure: 2D Echo, Cardiac Doppler, Color Doppler and Strain Analysis Indications:     Z51.11 Encounter for antineoplastic chemotherapy  History:         Patient has prior history of Echocardiogram examinations, most                  recent 05/21/2021. Cancer.  Sonographer:     Sherrie Sport  central venous thrombosis. 4. Incompletely imaged, significant soft tissue fullness surrounding the right shoulder in the setting of prior comminuted fracture. Consider dedicated imaging (contrast enhanced CT or MRI) and clinical correlation to exclude pathologic fracture with soft tissue component. 5. Right axillary developing adenopathy which could be reactive or metastatic given the appearance of the incompletely imaged right shoulder. 6. Trace bilateral pleural effusions, similar. 7. Aortic atherosclerosis (ICD10-I70.0) and emphysema (ICD10-J43.9). 8. Nonspecific enlarging anterior left chest wall nodule for which metastatic disease is possible. Recommend attention on follow-up. 9. Suspect developing osseous metastasis within the anterior right fifth rib. CT ABDOMEN AND PELVIS IMPRESSION 1. Progressive hepatic metastasis. 2. Cirrhosis and mild splenomegaly. 3. Right nephrolithiasis. Electronically Signed   By: Abigail Miyamoto M.D.   On: 07/19/2021 16:58   PERIPHERAL VASCULAR CATHETERIZATION  Result Date: 07/24/2021 See surgical note for result.  US Venous Img Upper Uni Right(DVT)  Result Date: 07/17/2021 CLINICAL DATA:  Right arm swelling, pain EXAM: RIGHT UPPER EXTREMITY VENOUS DOPPLER ULTRASOUND TECHNIQUE: Gray-scale sonography with graded compression, as well as color Doppler and duplex ultrasound were performed to evaluate the upper extremity deep venous system from the level of the subclavian vein and including the jugular, axillary, basilic, radial, ulnar and upper cephalic vein. Spectral Doppler was utilized to evaluate flow at rest and with distal augmentation maneuvers. COMPARISON:  None. FINDINGS: Contralateral Subclavian Vein: Respiratory phasicity is normal and symmetric with the symptomatic side. No evidence of thrombus. Normal compressibility. Internal Jugular Vein: No evidence of thrombus. Normal compressibility,  respiratory phasicity and response to augmentation. Subclavian Vein: No evidence of thrombus. Normal compressibility, respiratory phasicity and response to augmentation. Axillary Vein: No evidence of thrombus. Normal compressibility, respiratory phasicity and response to augmentation. Cephalic Vein: No evidence of thrombus. Normal compressibility, respiratory phasicity and response to augmentation. Basilic Vein: No evidence of thrombus. Normal compressibility, respiratory phasicity and response to augmentation. Brachial Veins: No evidence of thrombus. Normal compressibility, respiratory phasicity and response to augmentation. Radial Veins: No evidence of thrombus. Normal compressibility, respiratory phasicity and response to augmentation. Ulnar Veins: No evidence of thrombus. Normal compressibility, respiratory phasicity and response to augmentation. IMPRESSION: No evidence of DVT within the right upper extremity. Electronically Signed   By: Jerilynn Mages.  Shick M.D.   On: 07/17/2021 16:20   ECHOCARDIOGRAM COMPLETE  Result Date: 07/25/2021    ECHOCARDIOGRAM REPORT   Patient Name:   RONI SCOW Date of Exam: 07/25/2021 Medical Rec #:  [id number]      Height:       71.0 in Accession #:    [id number]     Weight:       202.6 lb Date of Birth:  1956-04-17     BSA:          2.120 m Patient Age:    33 years       BP:           142/78 mmHg Patient Gender: M              HR:           80 bpm. Exam Location:  [facility] Procedure: 2D Echo, Cardiac Doppler, Color Doppler and Strain Analysis Indications:     Z51.11 Encounter for antineoplastic chemotherapy  History:         Patient has prior history of Echocardiogram examinations, most                  recent 05/21/2021. Cancer.  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  He underwent EGD on 06/20/2020 By Dr. Vicente Males which showed a large nearly obstructing mass in the lower third of the esophagus 35 cm from incisors.  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  PD-l1 <1 %, TMB HIGH    Presently patient reports dysphagia but is able to eat cereal or soft food.  He has lost about 13 pounds in the last month itself.  Denies any significant pain.  He recently retired after many years of service and is otherwise independent of his ADLs and IADLs.     PET CT scan on 06/26/2020 showed circumferential distal esophageal mass with an SUV of 13.3 extending over 5.5 cm. Right lower paratracheal node is 0.6 cm with an SUV of 3.1. Hypermetabolic right gastric lymph nodes including 1.1 cm node with an SUV of 5.8. Numerous hypermetabolic lesions throughout the liver somewhat confluent around the periphery which were hypermetabolic between 8 and 9. Faintly hypermetabolic left adrenal gland   NGS testing showed high tumor mutational burden CPS score less than 1. CD9-NRG1 fusion. BRAF 581L, CCN E1 gain.  EMLA for fusion of BX [id number], FGF 23 gain, FGF 6 gain, MET R988C, T p53 S1 30F. ERBB2 gain but no other actionable mutations   Patient currently on first-line  FOLFOX/trastuzumab/Keytruda.  He gets Keytruda every 6 weeks  Patient noted to haveEnlarging duodenal based mass measuring 2 x 2.5 cm in size back in November 2022 dural based metastases versus meningioma were considered to be primary considerations.  Case discussed with neurosurgery and this spot was thought to be difficult to biopsy.    MRI brain in December 2022 showed significant enlargement of this mass to 4 cm with associated edema in the bilateral occipital lobes and marginal hemorrhage along the left aspect of the mass.  Increased subdural hematoma along the left cerebral convexity.  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  Patient received Byng radiation since single fraction to this mass.  PET scan inNovember 2022 showed interval resolution of activity in the esophagus as well as multifocal hepatic metastases and gastrohepatic lymph node.  No evidence of metastatic mediastinal lymph nodes but a small new right lower lobe lung nodule without metabolic activity was seen.  Patient therefore continued 5-FU trastuzumab Keytruda with last cycle given on 07/04/2021.   EchocardiogramIn November 2022 showed EF of 50 to 55% with low normal ejection fraction but no other abnormalities  Patient noted to have significant swelling in his right upper extremity.  CT venogram did not show any vascular stenosis and patient was seen by vascular surgery.  Swelling has been attributed secondary to his fracture that he has had in the past following a fall which has not been operated upon and was allowed to heal by secondary intent  Interval history-patient currently complains of continued pain and swelling in  Hematology/Oncology Consult note [facility]  [phone] [phone]  Patient Care Team: Venia Carbon, MD as PCP - General [name], RN as Oncology Nurse Navigator [name], MD as Consulting Physician (Hematology and Oncology)   Name of the patient: Cory [name]  [id number]  [birth year]   Date of visit: 07/27/21  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  He underwent EGD on 06/20/2020 By Dr. Vicente Males which showed a large nearly obstructing mass in the lower third of the esophagus 35 cm from incisors.  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  PD-l1 <1 %, TMB HIGH    Presently patient reports dysphagia but is able to eat cereal or soft food.  He has lost about 13 pounds in the last month itself.  Denies any significant pain.  He recently retired after many years of service and is otherwise independent of his ADLs and IADLs.     PET CT scan on 06/26/2020 showed circumferential distal esophageal mass with an SUV of 13.3 extending over 5.5 cm. Right lower paratracheal node is 0.6 cm with an SUV of 3.1. Hypermetabolic right gastric lymph nodes including 1.1 cm node with an SUV of 5.8. Numerous hypermetabolic lesions throughout the liver somewhat confluent around the periphery which were hypermetabolic between 8 and 9. Faintly hypermetabolic left adrenal gland   NGS testing showed high tumor mutational burden CPS score less than 1. CD9-NRG1 fusion. BRAF 581L, CCN E1 gain.  EMLA for fusion of BX [id number], FGF 23 gain, FGF 6 gain, MET R988C, T p53 S1 30F. ERBB2 gain but no other actionable mutations   Patient currently on first-line  FOLFOX/trastuzumab/Keytruda.  He gets Keytruda every 6 weeks  Patient noted to haveEnlarging duodenal based mass measuring 2 x 2.5 cm in size back in November 2022 dural based metastases versus meningioma were considered to be primary considerations.  Case discussed with neurosurgery and this spot was thought to be difficult to biopsy.    MRI brain in December 2022 showed significant enlargement of this mass to 4 cm with associated edema in the bilateral occipital lobes and marginal hemorrhage along the left aspect of the mass.  Increased subdural hematoma along the left cerebral convexity.  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  Patient received Byng radiation since single fraction to this mass.  PET scan inNovember 2022 showed interval resolution of activity in the esophagus as well as multifocal hepatic metastases and gastrohepatic lymph node.  No evidence of metastatic mediastinal lymph nodes but a small new right lower lobe lung nodule without metabolic activity was seen.  Patient therefore continued 5-FU trastuzumab Keytruda with last cycle given on 07/04/2021.   EchocardiogramIn November 2022 showed EF of 50 to 55% with low normal ejection fraction but no other abnormalities  Patient noted to have significant swelling in his right upper extremity.  CT venogram did not show any vascular stenosis and patient was seen by vascular surgery.  Swelling has been attributed secondary to his fracture that he has had in the past following a fall which has not been operated upon and was allowed to heal by secondary intent  Interval history-patient currently complains of continued pain and swelling in  central venous thrombosis. 4. Incompletely imaged, significant soft tissue fullness surrounding the right shoulder in the setting of prior comminuted fracture. Consider dedicated imaging (contrast enhanced CT or MRI) and clinical correlation to exclude pathologic fracture with soft tissue component. 5. Right axillary developing adenopathy which could be reactive or metastatic given the appearance of the incompletely imaged right shoulder. 6. Trace bilateral pleural effusions, similar. 7. Aortic atherosclerosis (ICD10-I70.0) and emphysema (ICD10-J43.9). 8. Nonspecific enlarging anterior left chest wall nodule for which metastatic disease is possible. Recommend attention on follow-up. 9. Suspect developing osseous metastasis within the anterior right fifth rib. CT ABDOMEN AND PELVIS IMPRESSION 1. Progressive hepatic metastasis. 2. Cirrhosis and mild splenomegaly. 3. Right nephrolithiasis. Electronically Signed   By: Abigail Miyamoto M.D.   On: 07/19/2021 16:58   PERIPHERAL VASCULAR CATHETERIZATION  Result Date: 07/24/2021 See surgical note for result.  US Venous Img Upper Uni Right(DVT)  Result Date: 07/17/2021 CLINICAL DATA:  Right arm swelling, pain EXAM: RIGHT UPPER EXTREMITY VENOUS DOPPLER ULTRASOUND TECHNIQUE: Gray-scale sonography with graded compression, as well as color Doppler and duplex ultrasound were performed to evaluate the upper extremity deep venous system from the level of the subclavian vein and including the jugular, axillary, basilic, radial, ulnar and upper cephalic vein. Spectral Doppler was utilized to evaluate flow at rest and with distal augmentation maneuvers. COMPARISON:  None. FINDINGS: Contralateral Subclavian Vein: Respiratory phasicity is normal and symmetric with the symptomatic side. No evidence of thrombus. Normal compressibility. Internal Jugular Vein: No evidence of thrombus. Normal compressibility,  respiratory phasicity and response to augmentation. Subclavian Vein: No evidence of thrombus. Normal compressibility, respiratory phasicity and response to augmentation. Axillary Vein: No evidence of thrombus. Normal compressibility, respiratory phasicity and response to augmentation. Cephalic Vein: No evidence of thrombus. Normal compressibility, respiratory phasicity and response to augmentation. Basilic Vein: No evidence of thrombus. Normal compressibility, respiratory phasicity and response to augmentation. Brachial Veins: No evidence of thrombus. Normal compressibility, respiratory phasicity and response to augmentation. Radial Veins: No evidence of thrombus. Normal compressibility, respiratory phasicity and response to augmentation. Ulnar Veins: No evidence of thrombus. Normal compressibility, respiratory phasicity and response to augmentation. IMPRESSION: No evidence of DVT within the right upper extremity. Electronically Signed   By: Jerilynn Mages.  Shick M.D.   On: 07/17/2021 16:20   ECHOCARDIOGRAM COMPLETE  Result Date: 07/25/2021    ECHOCARDIOGRAM REPORT   Patient Name:   RONI SCOW Date of Exam: 07/25/2021 Medical Rec #:  [id number]      Height:       71.0 in Accession #:    [id number]     Weight:       202.6 lb Date of Birth:  1956-04-17     BSA:          2.120 m Patient Age:    33 years       BP:           142/78 mmHg Patient Gender: M              HR:           80 bpm. Exam Location:  [facility] Procedure: 2D Echo, Cardiac Doppler, Color Doppler and Strain Analysis Indications:     Z51.11 Encounter for antineoplastic chemotherapy  History:         Patient has prior history of Echocardiogram examinations, most                  recent 05/21/2021. Cancer.  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  He underwent EGD on 06/20/2020 By Dr. Vicente Males which showed a large nearly obstructing mass in the lower third of the esophagus 35 cm from incisors.  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  PD-l1 <1 %, TMB HIGH    Presently patient reports dysphagia but is able to eat cereal or soft food.  He has lost about 13 pounds in the last month itself.  Denies any significant pain.  He recently retired after many years of service and is otherwise independent of his ADLs and IADLs.     PET CT scan on 06/26/2020 showed circumferential distal esophageal mass with an SUV of 13.3 extending over 5.5 cm. Right lower paratracheal node is 0.6 cm with an SUV of 3.1. Hypermetabolic right gastric lymph nodes including 1.1 cm node with an SUV of 5.8. Numerous hypermetabolic lesions throughout the liver somewhat confluent around the periphery which were hypermetabolic between 8 and 9. Faintly hypermetabolic left adrenal gland   NGS testing showed high tumor mutational burden CPS score less than 1. CD9-NRG1 fusion. BRAF 581L, CCN E1 gain.  EMLA for fusion of BX [id number], FGF 23 gain, FGF 6 gain, MET R988C, T p53 S1 30F. ERBB2 gain but no other actionable mutations   Patient currently on first-line  FOLFOX/trastuzumab/Keytruda.  He gets Keytruda every 6 weeks  Patient noted to haveEnlarging duodenal based mass measuring 2 x 2.5 cm in size back in November 2022 dural based metastases versus meningioma were considered to be primary considerations.  Case discussed with neurosurgery and this spot was thought to be difficult to biopsy.    MRI brain in December 2022 showed significant enlargement of this mass to 4 cm with associated edema in the bilateral occipital lobes and marginal hemorrhage along the left aspect of the mass.  Increased subdural hematoma along the left cerebral convexity.  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  Patient received Byng radiation since single fraction to this mass.  PET scan inNovember 2022 showed interval resolution of activity in the esophagus as well as multifocal hepatic metastases and gastrohepatic lymph node.  No evidence of metastatic mediastinal lymph nodes but a small new right lower lobe lung nodule without metabolic activity was seen.  Patient therefore continued 5-FU trastuzumab Keytruda with last cycle given on 07/04/2021.   EchocardiogramIn November 2022 showed EF of 50 to 55% with low normal ejection fraction but no other abnormalities  Patient noted to have significant swelling in his right upper extremity.  CT venogram did not show any vascular stenosis and patient was seen by vascular surgery.  Swelling has been attributed secondary to his fracture that he has had in the past following a fall which has not been operated upon and was allowed to heal by secondary intent  Interval history-patient currently complains of continued pain and swelling in  central venous thrombosis. 4. Incompletely imaged, significant soft tissue fullness surrounding the right shoulder in the setting of prior comminuted fracture. Consider dedicated imaging (contrast enhanced CT or MRI) and clinical correlation to exclude pathologic fracture with soft tissue component. 5. Right axillary developing adenopathy which could be reactive or metastatic given the appearance of the incompletely imaged right shoulder. 6. Trace bilateral pleural effusions, similar. 7. Aortic atherosclerosis (ICD10-I70.0) and emphysema (ICD10-J43.9). 8. Nonspecific enlarging anterior left chest wall nodule for which metastatic disease is possible. Recommend attention on follow-up. 9. Suspect developing osseous metastasis within the anterior right fifth rib. CT ABDOMEN AND PELVIS IMPRESSION 1. Progressive hepatic metastasis. 2. Cirrhosis and mild splenomegaly. 3. Right nephrolithiasis. Electronically Signed   By: Abigail Miyamoto M.D.   On: 07/19/2021 16:58   PERIPHERAL VASCULAR CATHETERIZATION  Result Date: 07/24/2021 See surgical note for result.  US Venous Img Upper Uni Right(DVT)  Result Date: 07/17/2021 CLINICAL DATA:  Right arm swelling, pain EXAM: RIGHT UPPER EXTREMITY VENOUS DOPPLER ULTRASOUND TECHNIQUE: Gray-scale sonography with graded compression, as well as color Doppler and duplex ultrasound were performed to evaluate the upper extremity deep venous system from the level of the subclavian vein and including the jugular, axillary, basilic, radial, ulnar and upper cephalic vein. Spectral Doppler was utilized to evaluate flow at rest and with distal augmentation maneuvers. COMPARISON:  None. FINDINGS: Contralateral Subclavian Vein: Respiratory phasicity is normal and symmetric with the symptomatic side. No evidence of thrombus. Normal compressibility. Internal Jugular Vein: No evidence of thrombus. Normal compressibility,  respiratory phasicity and response to augmentation. Subclavian Vein: No evidence of thrombus. Normal compressibility, respiratory phasicity and response to augmentation. Axillary Vein: No evidence of thrombus. Normal compressibility, respiratory phasicity and response to augmentation. Cephalic Vein: No evidence of thrombus. Normal compressibility, respiratory phasicity and response to augmentation. Basilic Vein: No evidence of thrombus. Normal compressibility, respiratory phasicity and response to augmentation. Brachial Veins: No evidence of thrombus. Normal compressibility, respiratory phasicity and response to augmentation. Radial Veins: No evidence of thrombus. Normal compressibility, respiratory phasicity and response to augmentation. Ulnar Veins: No evidence of thrombus. Normal compressibility, respiratory phasicity and response to augmentation. IMPRESSION: No evidence of DVT within the right upper extremity. Electronically Signed   By: Jerilynn Mages.  Shick M.D.   On: 07/17/2021 16:20   ECHOCARDIOGRAM COMPLETE  Result Date: 07/25/2021    ECHOCARDIOGRAM REPORT   Patient Name:   RONI SCOW Date of Exam: 07/25/2021 Medical Rec #:  [id number]      Height:       71.0 in Accession #:    [id number]     Weight:       202.6 lb Date of Birth:  1956-04-17     BSA:          2.120 m Patient Age:    33 years       BP:           142/78 mmHg Patient Gender: M              HR:           80 bpm. Exam Location:  [facility] Procedure: 2D Echo, Cardiac Doppler, Color Doppler and Strain Analysis Indications:     Z51.11 Encounter for antineoplastic chemotherapy  History:         Patient has prior history of Echocardiogram examinations, most                  recent 05/21/2021. Cancer.  Sonographer:     Sherrie Sport

## 2021-07-29 ENCOUNTER — Ambulatory Visit
Admission: RE | Admit: 2021-07-29 | Discharge: 2021-07-29 | Disposition: A | Discharge status: Home / Self Care | Payer: Medicare Other | Source: Ambulatory Visit | Attending: Oncology | Admitting: Oncology

## 2021-07-29 ENCOUNTER — Other Ambulatory Visit: Payer: Self-pay

## 2021-07-29 DIAGNOSIS — C159 Malignant neoplasm of esophagus, unspecified: Secondary | ICD-10-CM | POA: Diagnosis not present

## 2021-07-29 MED ORDER — GADOBUTROL 1 MMOL/ML IV SOLN
10.0000 mL | Freq: Once | INTRAVENOUS | Status: AC | PRN
  Administered 2021-07-29: 10 mL via INTRAVENOUS

## 2021-07-30 ENCOUNTER — Telehealth: Payer: Self-pay

## 2021-07-30 ENCOUNTER — Telehealth: Payer: Self-pay | Admitting: Oncology

## 2021-07-30 ENCOUNTER — Other Ambulatory Visit: Payer: Self-pay | Admitting: *Deleted

## 2021-07-30 ENCOUNTER — Other Ambulatory Visit: Payer: Self-pay | Admitting: Oncology

## 2021-07-30 DIAGNOSIS — I5189 Other ill-defined heart diseases: Secondary | ICD-10-CM

## 2021-07-30 DIAGNOSIS — C159 Malignant neoplasm of esophagus, unspecified: Secondary | ICD-10-CM

## 2021-07-30 NOTE — Progress Notes (Signed)
Order for urgent ref for heart mass

## 2021-07-30 NOTE — Telephone Encounter (Signed)
Spoke with patient to confirm appointments this week. He is to see [facility] 1/24, [facility] on 1/25 and then [facility] with Dr. Fletcher Anon on 1/26 @ 9:20 am.

## 2021-07-30 NOTE — Progress Notes (Signed)
DISCONTINUE ON PATHWAY REGIMEN - Gastroesophageal     A cycle is every 14 days:     Oxaliplatin      Leucovorin      Fluorouracil      Fluorouracil   **Always confirm dose/schedule in your pharmacy ordering system**  REASON: Disease Progression PRIOR TREATMENT: GEOS3: mFOLFOX6 q14 Days Until Progression or Unacceptable Toxicity TREATMENT RESPONSE: Progressive Disease (PD)  START OFF PATHWAY REGIMEN - Gastroesophageal   [id number]:Fam-trastuzumab deruxtecan-nxki 6.4 mg/kg IV D1 q21 Days:   A cycle is every 21 days:     Fam-trastuzumab deruxtecan-nxki   **Always confirm dose/schedule in your pharmacy ordering system**  Patient Characteristics: Distant Metastases (cM1/pM1) / Locally Recurrent Disease, Adenocarcinoma - Esophageal, GE Junction, and Gastric, Second Line, MSS/pMMR or MSI Unknown Histology: Adenocarcinoma Disease Classification: GE Junction Therapeutic Status: Distant Metastases (No Additional Staging) Line of Therapy: Second Line Microsatellite/Mismatch Repair Status: MSS/pMMR Intent of Therapy: Non-Curative / Palliative Intent, Discussed with Patient

## 2021-07-30 NOTE — Progress Notes (Signed)
Pharmacist Chemotherapy Monitoring - Initial Assessment    Anticipated start date: 08/06/21   The following has been reviewed per standard work regarding the patient's treatment regimen: The patient's diagnosis, treatment plan and drug doses, and organ/hematologic function Lab orders and baseline tests specific to treatment regimen  The treatment plan start date, drug sequencing, and pre-medications Prior authorization status  Patient's documented medication list, including drug-drug interaction screen and prescriptions for anti-emetics and supportive care specific to the treatment regimen The drug concentrations, fluid compatibility, administration routes, and timing of the medications to be used The patient's access for treatment and lifetime cumulative dose history, if applicable  The patient's medication allergies and previous infusion related reactions, if applicable   Changes made to treatment plan:  treatment plan date  Follow up needed:  [place], [place], 07/30/2021  11:05 AM

## 2021-07-30 NOTE — Telephone Encounter (Signed)
-----   Message from Ace Gins sent at 07/30/2021  9:47 AM EST ----- Regarding: Dr Garen Lah read ECHO Hello,  Judeen Hammans from [facility] / Dr Elroy Channel office calling.  States Dr Garen Lah read this patients echo and recommended a cardiac MRI.  Would like assistance on where this is done and how to order this for patient.  Please call Judeen Hammans at [phone]. Thanks

## 2021-07-30 NOTE — Telephone Encounter (Signed)
Previous referral was sent to [facility] to see if Cory [name] could be seen 1/24 for second opinion following his appointment with ortho. Message was sent to Dr. Oralia Rud to see if she could see him. While awaiting appointment noted that Cory [name] in now being urgently seen by cardiology. Will await call back from [facility].

## 2021-07-30 NOTE — Telephone Encounter (Signed)
Called Sherry back at the cancer center and informed her how to order a cardiac MRI. She also informed me that Dr. [name] put in an urgent referral for the patient. Patient was called and scheduled with Dr. Fletcher Anon tomorrow 07/31/21.

## 2021-07-31 ENCOUNTER — Ambulatory Visit: Payer: Medicare Other | Admitting: Cardiovascular Disease

## 2021-08-01 ENCOUNTER — Telehealth: Payer: Self-pay

## 2021-08-01 ENCOUNTER — Telehealth: Payer: Self-pay | Admitting: *Deleted

## 2021-08-01 NOTE — Telephone Encounter (Signed)
Dr. Oralia Rud from [facility] called stating that she faxed over her note to Dr [name] on this patient and wanted Dr [name] to have her cell number in case there are any questions or another referral back to her is needed [phone]

## 2021-08-01 NOTE — Telephone Encounter (Signed)
That is very nice of her. Thank you

## 2021-08-01 NOTE — Telephone Encounter (Signed)
Called and spoke with Mr. Littler. [facility] appointment arranged 08/02/21.

## 2021-08-02 ENCOUNTER — Other Ambulatory Visit: Payer: Self-pay

## 2021-08-02 ENCOUNTER — Encounter: Payer: Self-pay | Admitting: Cardiovascular Disease

## 2021-08-02 ENCOUNTER — Other Ambulatory Visit: Payer: Self-pay | Admitting: Oncology

## 2021-08-02 ENCOUNTER — Ambulatory Visit (INDEPENDENT_AMBULATORY_CARE_PROVIDER_SITE_OTHER): Payer: Medicare Other | Admitting: Cardiovascular Disease

## 2021-08-02 ENCOUNTER — Inpatient Hospital Stay: Payer: Medicare Other

## 2021-08-02 VITALS — BP 144/60 | HR 65 | Ht 71.0 in | Wt 194.1 lb

## 2021-08-02 DIAGNOSIS — I5189 Other ill-defined heart diseases: Secondary | ICD-10-CM | POA: Diagnosis not present

## 2021-08-02 NOTE — Progress Notes (Signed)
Cardiology Office Note   Date:  08/02/2021   ID:  Cory [name], DOB 03-01-1956, MRN [id number]  PCP:  Venia Carbon, MD  Cardiologist:   [name], MD   Chief Complaint  Patient presents with   Other    Discuss echo/Mass in the LV apex no complaints today. Meds reviewed verbally with pt.      History of Present Illness: Cory [name] is a 66 y.o. male who was referred by Dr.Rao for evaluation of left ventricular mass.  He has no prior cardiac history.  He has history of metastatic esophageal cancer with poor response to chemotherapy so far.  He also had radiation therapy to brain mets.  He has been having serial echocardiograms for surveillance given his chemotherapy.  Most recent ejection fraction was 50 to 55%.  There was a 17 x 16 mm mass in the LV anterolateral apical area.  Echocardiogram was personally reviewed by me.  I reviewed his prior echocardiograms which also showed this but was less prominent and more suggestive of trabeculations. The patient denies any chest pain or shortness of breath.   Past Medical History:  Diagnosis Date   Esophageal adenocarcinoma ([place]) 06/23/2020   Family history of brain cancer    Family history of colon cancer    Family history of melanoma    Family history of stomach cancer    GERD (gastroesophageal reflux disease)    History of kidney stones    Nephrolithiasis    [facility]   Pancreatitis, acute    Personal history of colonic polyps    Dr Nicolasa Ducking   Psoriasis     Past Surgical History:  Procedure Laterality Date   COLONOSCOPY     COLONOSCOPY WITH PROPOFOL N/A 03/31/2020   Procedure: COLONOSCOPY WITH PROPOFOL;  Surgeon: Jonathon Bellows, MD;  Location: [facility] ENDOSCOPY;  Service: Gastroenterology;  Laterality: N/A;   ERCP     ESOPHAGOGASTRODUODENOSCOPY (EGD) WITH PROPOFOL N/A 06/19/2020   Procedure: ESOPHAGOGASTRODUODENOSCOPY (EGD) WITH PROPOFOL;  Surgeon: Jonathon Bellows, MD;  Location: [facility] ENDOSCOPY;  Service: Gastroenterology;   Laterality: N/A;   groin surgery     as a child   PORTA CATH INSERTION N/A 06/28/2020   Procedure: PORTA CATH INSERTION;  Surgeon: Algernon Huxley, MD;  Location: [place] CV LAB;  Service: Cardiovascular;  Laterality: N/A;   SHOULDER SURGERY     UPPER EXTREMITY VENOGRAPHY Right 07/24/2021   Procedure: UPPER EXTREMITY VENOGRAPHY;  Surgeon: Katha Cabal, MD;  Location: [place] [facility];  Service: Cardiovascular;  Laterality: Right;     Current Outpatient Medications  Medication Sig Dispense Refill   dexamethasone (DECADRON) 4 MG tablet Take 1 tablet (4 mg total) by mouth 2 (two) times daily. 28 tablet 0   fentaNYL (DURAGESIC) 12 MCG/HR Place 1 patch onto the skin every 3 (three) days. 10 patch 0   lidocaine-prilocaine (EMLA) cream Apply 1 application topically as needed. Apply small amount to port site at least 1 hour prior to it being accessed, cover with plastic wrap 30 g 3   Oxycodone HCl 10 MG TABS Take 1 tablet (10 mg total) by mouth every 4 (four) hours as needed. 120 tablet 0   pantoprazole (PROTONIX) 40 MG tablet TAKE 1 TABLET BY MOUTH TWICE A DAY BEFORE MEALS 60 tablet 11   triamcinolone cream (KENALOG) 0.1 % Apply 1 application topically 3 (three) times daily as needed. 300 each 1   No current facility-administered medications for this visit.  Allergies:   Patient has no known allergies.    Social History:  The patient  reports that he quit smoking about 33 years ago. His smoking use included cigarettes. He has never used smokeless tobacco. He reports that he does not drink alcohol and does not use drugs.   Family History:  The patient's family history includes Brain cancer in his sister; Cancer in an other family member; Colon cancer in his sister; Diabetes in his brother; Hypertension in his brother and father; Prostate cancer in his father; Stomach cancer in his mother.    ROS:  Please see the history of present illness.   Otherwise, review of systems are  positive for none.   All other systems are reviewed and negative.    PHYSICAL EXAM: VS:  BP (!) 144/60 (BP Location: Left Arm, Patient Position: Sitting, Cuff Size: Normal)    Pulse 65    Ht 5\' 11"  (1.803 m)    Wt 194 lb 2 oz (88.1 kg)    SpO2 99%    BMI 27.07 kg/m  , BMI Body mass index is 27.07 kg/m. GEN: Well nourished, well developed, in no acute distress  HEENT: normal  Neck: no JVD, carotid bruits, or masses Cardiac: RRR; no murmurs, rubs, or gallops,no edema  Respiratory:  clear to auscultation bilaterally, normal work of breathing GI: soft, nontender, nondistended, + BS MS: no deformity or atrophy  Skin: warm and dry, no rash Neuro:  Strength and sensation are intact Psych: euthymic mood, full affect   EKG:  EKG is ordered today. The ekg ordered today demonstrates sinus rhythm with a PAC.  No significant ST or T wave changes.   Recent Labs: 03/27/2021: TSH 1.351 07/18/2021: ALT 24; BUN 10; Creatinine, Ser 0.87; Hemoglobin 11.6; Platelets 134; Potassium 3.5; Sodium 134    Lipid Panel    Component Value Date/Time   CHOL 156 01/09/2017 0839   TRIG 63.0 01/09/2017 0839   HDL 40.40 01/09/2017 0839   CHOLHDL 4 01/09/2017 0839   VLDL 12.6 01/09/2017 0839   LDLCALC 103 (H) 01/09/2017 0839   LDLDIRECT 154.8 04/28/2009 1246      Wt Readings from Last 3 Encounters:  08/02/21 194 lb 2 oz (88.1 kg)  07/27/21 198 lb (89.8 kg)  07/24/21 202 lb 9.6 oz (91.9 kg)      PAD Screen 08/02/2021  Previous PAD dx? No  Previous surgical procedure? No  Pain with walking? No  Feet/toe relief with dangling? No  Painful, non-healing ulcers? No  Extremities discolored? No      ASSESSMENT AND PLAN:  1.  Possible LV mass: I personally reviewed the echocardiogram.  This could also be a prominent calcified trabeculation.  It would be very unlikely to represent metastatic cancer. It does not have the appearance of an LV thrombus and seems to be very echodense. I will discuss with Dr.  [name] the options which include cardiac MRI versus just waiting for his next echocardiogram to ensure stability.  The best option is likely to repeat echocardiogram in 3 months.  2.  Metastatic esophageal adenocarcinoma: Managed by oncology.  Disposition:   FU with me in 3 months  Signed,  [name], MD  08/02/2021 9:32 AM    [place]

## 2021-08-02 NOTE — Patient Instructions (Signed)
Medication Instructions:  Your physician recommends that you continue on your current medications as directed. Please refer to the Current Medication list given to you today.  *If you need a refill on your cardiac medications before your next appointment, please call your pharmacy*   Lab Work: None ordered If you have labs (blood work) drawn today and your tests are completely normal, you will receive your results only by: [place] (if you have MyChart) OR A paper copy in the mail If you have any lab test that is abnormal or we need to change your treatment, we will call you to review the results.   Testing/Procedures: None ordered   Follow-Up: At [facility], you and your health needs are our priority.  As part of our continuing mission to provide you with exceptional heart care, we have created designated Provider Care Teams.  These Care Teams include your primary Cardiologist (physician) and Advanced Practice Providers (APPs -  Physician Assistants and Nurse Practitioners) who all work together to provide you with the care you need, when you need it.  We recommend signing up for the patient portal called "MyChart".  Sign up information is provided on this After Visit Summary.  MyChart is used to connect with patients for Virtual Visits (Telemedicine).  Patients are able to view lab/test results, encounter notes, upcoming appointments, etc.  Non-urgent messages can be sent to your provider as well.   To learn more about what you can do with MyChart, go to [email or web address].    Your next appointment:   Dr. Fletcher Anon is going to consult with your Oncologist.   The format for your next appointment:   In Person  Provider:   You may see [name], MD or one of the following Advanced Practice Providers on your designated Care Team:   Murray Hodgkins, NP Christell Faith, PA-C Cadence Kathlen Mody, PA-C{    Other Instructions N/A

## 2021-08-03 ENCOUNTER — Encounter: Payer: Self-pay | Admitting: Oncology

## 2021-08-06 ENCOUNTER — Inpatient Hospital Stay: Payer: Medicare Other

## 2021-08-06 ENCOUNTER — Encounter: Payer: Self-pay | Admitting: Oncology

## 2021-08-06 ENCOUNTER — Other Ambulatory Visit: Payer: Self-pay

## 2021-08-06 ENCOUNTER — Other Ambulatory Visit: Payer: Self-pay | Admitting: *Deleted

## 2021-08-06 ENCOUNTER — Inpatient Hospital Stay (HOSPITAL_BASED_OUTPATIENT_CLINIC_OR_DEPARTMENT_OTHER): Payer: Medicare Other | Admitting: Oncology

## 2021-08-06 VITALS — BP 147/69 | HR 61 | Temp 99.1°F | Resp 18 | Ht 71.0 in | Wt 195.0 lb

## 2021-08-06 VITALS — BP 138/60 | HR 52 | Temp 97.0°F

## 2021-08-06 DIAGNOSIS — Z7189 Other specified counseling: Secondary | ICD-10-CM

## 2021-08-06 DIAGNOSIS — C159 Malignant neoplasm of esophagus, unspecified: Secondary | ICD-10-CM

## 2021-08-06 DIAGNOSIS — C155 Malignant neoplasm of lower third of esophagus: Secondary | ICD-10-CM | POA: Diagnosis not present

## 2021-08-06 DIAGNOSIS — Z5112 Encounter for antineoplastic immunotherapy: Secondary | ICD-10-CM

## 2021-08-06 LAB — COMPREHENSIVE METABOLIC PANEL
ALT: 61 U/L — ABNORMAL HIGH (ref 0–44)
AST: 32 U/L (ref 15–41)
Albumin: 3.5 g/dL (ref 3.5–5.0)
Alkaline Phosphatase: 147 U/L — ABNORMAL HIGH (ref 38–126)
Anion gap: 10 (ref 5–15)
BUN: 23 mg/dL (ref 8–23)
CO2: 22 mmol/L (ref 22–32)
Calcium: 8.9 mg/dL (ref 8.9–10.3)
Chloride: 101 mmol/L (ref 98–111)
Creatinine, Ser: 0.67 mg/dL (ref 0.61–1.24)
GFR, Estimated: >60 mL/min (ref >60)
Glucose, Bld: 220 mg/dL — ABNORMAL HIGH (ref 70–99)
Potassium: 3.8 mmol/L (ref 3.5–5.1)
Sodium: 133 mmol/L — ABNORMAL LOW (ref 135–145)
Total Bilirubin: 1.1 mg/dL (ref 0.3–1.2)
Total Protein: 6.6 g/dL (ref 6.5–8.1)

## 2021-08-06 LAB — CBC WITH DIFFERENTIAL/PLATELET
Abs Immature Granulocytes: 0.15 10*3/uL — ABNORMAL HIGH (ref 0.00–0.07)
Basophils Absolute: 0 10*3/uL (ref 0.0–0.1)
Basophils Relative: 0 %
Eosinophils Absolute: 0 10*3/uL (ref 0.0–0.5)
Eosinophils Relative: 0 %
HCT: 38.3 % — ABNORMAL LOW (ref 39.0–52.0)
Hemoglobin: 12.3 g/dL — ABNORMAL LOW (ref 13.0–17.0)
Immature Granulocytes: 1 %
Lymphocytes Relative: 3 %
Lymphs Abs: 0.3 10*3/uL — ABNORMAL LOW (ref 0.7–4.0)
MCH: 28.7 pg (ref 26.0–34.0)
MCHC: 32.1 g/dL (ref 30.0–36.0)
MCV: 89.3 fL (ref 80.0–100.0)
Monocytes Absolute: 0.7 10*3/uL (ref 0.1–1.0)
Monocytes Relative: 6 %
Neutro Abs: 10.8 10*3/uL — ABNORMAL HIGH (ref 1.7–7.7)
Neutrophils Relative %: 90 %
Platelets: 163 10*3/uL (ref 150–400)
RBC: 4.29 MIL/uL (ref 4.22–5.81)
RDW: 15.9 % — ABNORMAL HIGH (ref 11.5–15.5)
WBC: 12 10*3/uL — ABNORMAL HIGH (ref 4.0–10.5)
nRBC: 0 % (ref 0.0–0.2)

## 2021-08-06 MED ORDER — PALONOSETRON HCL INJECTION 0.25 MG/5ML
0.2500 mg | Freq: Once | INTRAVENOUS | Status: AC
  Administered 2021-08-06: 0.25 mg via INTRAVENOUS
  Filled 2021-08-06: qty 5

## 2021-08-06 MED ORDER — HEPARIN SOD (PORK) LOCK FLUSH 100 UNIT/ML IV SOLN
500.0000 [IU] | Freq: Once | INTRAVENOUS | Status: AC | PRN
  Filled 2021-08-06: qty 5

## 2021-08-06 MED ORDER — ACETAMINOPHEN 325 MG PO TABS
650.0000 mg | ORAL_TABLET | Freq: Once | ORAL | Status: AC
  Administered 2021-08-06: 650 mg via ORAL
  Filled 2021-08-06: qty 2

## 2021-08-06 MED ORDER — SODIUM CHLORIDE 0.9 % IV SOLN
10.0000 mg | Freq: Once | INTRAVENOUS | Status: AC
  Administered 2021-08-06: 10 mg via INTRAVENOUS
  Filled 2021-08-06: qty 10

## 2021-08-06 MED ORDER — PROCHLORPERAZINE MALEATE 10 MG PO TABS: 10.0000 mg | ORAL_TABLET | Freq: Four times a day (QID) | ORAL | 0 refills | Status: DC | PRN

## 2021-08-06 MED ORDER — FAM-TRASTUZUMAB DERUXTECAN-NXKI CHEMO 100 MG IV SOLR
6.4000 mg/kg | Freq: Once | INTRAVENOUS | Status: AC
  Administered 2021-08-06: 574 mg via INTRAVENOUS
  Filled 2021-08-06: qty 28.7

## 2021-08-06 MED ORDER — HEPARIN SOD (PORK) LOCK FLUSH 100 UNIT/ML IV SOLN
INTRAVENOUS | Status: AC
  Administered 2021-08-06: 500 [IU]
  Filled 2021-08-06: qty 5

## 2021-08-06 MED ORDER — DIPHENHYDRAMINE HCL 25 MG PO CAPS
50.0000 mg | ORAL_CAPSULE | Freq: Once | ORAL | Status: AC
  Administered 2021-08-06: 50 mg via ORAL
  Filled 2021-08-06: qty 2

## 2021-08-06 MED ORDER — DEXTROSE 5 % IV SOLN
Freq: Once | INTRAVENOUS | Status: AC
  Filled 2021-08-06: qty 250

## 2021-08-06 NOTE — Progress Notes (Signed)
Hematology/Oncology Consult note [facility]  Telephone:([phone] [phone]  Patient Care Team: Karie Schwalbe, MD as PCP - General Benita Gutter, RN as Oncology Nurse Navigator Creig Hines, MD as Consulting Physician (Hematology and Oncology)   Name of the patient: Cory [name]  [id number]  June 17, 1956   Date of visit: 08/06/21  Diagnosis- metastatic esophageal cancer with liver metastases  Chief complaint/ Reason for visit-on treatment assessment prior to cycle 1 of Enhertu  Heme/Onc history: Patient is a 66 year old male who presented with symptoms of indigestion and heartburn which gradually progressed to dysphagia.  He underwent EGD on 06/20/2020 By Dr. Tobi Bastos which showed a large nearly obstructing mass in the lower third of the esophagus 35 cm from incisors.  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  PD-l1 <1 %, TMB HIGH    Presently patient reports dysphagia but is able to eat cereal or soft food.  He has lost about 13 pounds in the last month itself.  Denies any significant pain.  He recently retired after many years of service and is otherwise independent of his ADLs and IADLs.     PET CT scan on 06/26/2020 showed circumferential distal esophageal mass with an SUV of 13.3 extending over 5.5 cm. Right lower paratracheal node is 0.6 cm with an SUV of 3.1. Hypermetabolic right gastric lymph nodes including 1.1 cm node with an SUV of 5.8. Numerous hypermetabolic lesions throughout the liver somewhat confluent around the periphery which were hypermetabolic between 8 and 9. Faintly hypermetabolic left adrenal gland   NGS testing showed high tumor mutational burden CPS score less than 1. CD9-NRG1 fusion. BRAF 581L, CCN E1 gain.  EMLA for fusion of BX [id number], FGF 23 gain, FGF 6 gain, MET R988C, T p53 S1 61F. ERBB2 gain but no other actionable mutations   Patient currently on first-line  FOLFOX/trastuzumab/Keytruda.  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  Case discussed with neurosurgery and this spot was thought to be difficult to biopsy.    MRI brain in December 2022 showed significant enlargement of this mass to 4 cm with associated edema in the bilateral occipital lobes and marginal hemorrhage along the left aspect of the mass.  Increased subdural hematoma along the left cerebral convexity.  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  Patient received 20 Wallace Cullens radiation since single fraction to this mass.   PET scan inNovember 2022 showed interval resolution of activity in the esophagus as well as multifocal hepatic metastases and gastrohepatic lymph node.  No evidence of metastatic mediastinal lymph nodes but a small new right lower lobe lung nodule without metabolic activity was seen.  Patient therefore continued 5-FU trastuzumab Keytruda with last cycle given on 07/04/2021.    EchocardiogramIn November 2022 showed EF of 50 to 55% with low normal ejection fraction but no other abnormalities   Patient noted to have significant swelling in his right upper extremity.  CT venogram did not show any vascular stenosis and patient was seen by vascular surgery.  Swelling has been attributed secondary to his fracture that he has had in the past following a fall which has not been operated upon and was allowed to heal by secondary intent  Patient seen at [facility] Dr. Timoteo Gaul  for second opinion.  They agreed upon Enhertu for second line treatment if he continues to remain HER2 positive on repeat testing  Interval history-patient is unable to see from his right eye at this point.  He does not drive anymore.  Continues to have right upper extremity swelling.  Pain is relatively well  controlled with fentanyl patch and as needed oxycodone.  ECOG PS- 1 Pain scale- 2 Opioid associated constipation- no  Review of systems- Review of Systems  Constitutional:  Negative for chills, fever, malaise/fatigue and weight loss.  HENT:  Negative for congestion, ear discharge and nosebleeds.   Eyes:  Negative for blurred vision.  Respiratory:  Negative for cough, hemoptysis, sputum production, shortness of breath and wheezing.   Cardiovascular:  Negative for chest pain, palpitations, orthopnea and claudication.  Gastrointestinal:  Negative for abdominal pain, blood in stool, constipation, diarrhea, heartburn, melena, nausea and vomiting.  Genitourinary:  Negative for dysuria, flank pain, frequency, hematuria and urgency.  Musculoskeletal:  Negative for back pain, joint pain and myalgias.       Right arm pain and swelling  Skin:  Negative for rash.  Neurological:  Negative for dizziness, tingling, focal weakness, seizures, weakness and headaches.  Endo/Heme/Allergies:  Does not bruise/bleed easily.  Psychiatric/Behavioral:  Negative for depression and suicidal ideas. The patient does not have insomnia.      No Known Allergies   Past Medical History:  Diagnosis Date   Esophageal adenocarcinoma (HCC) 06/23/2020   Family history of brain cancer    Family history of colon cancer    Family history of melanoma    Family history of stomach cancer    GERD (gastroesophageal reflux disease)    History of kidney stones    Nephrolithiasis    [facility]   Pancreatitis, acute    Personal history of colonic polyps    Dr Maryruth Bun   Psoriasis      Past Surgical History:  Procedure Laterality Date   COLONOSCOPY     COLONOSCOPY WITH PROPOFOL N/A 03/31/2020   Procedure: COLONOSCOPY WITH PROPOFOL;  Surgeon: Wyline Mood, MD;  Location: [facility] ENDOSCOPY;  Service: Gastroenterology;  Laterality: N/A;   ERCP     ESOPHAGOGASTRODUODENOSCOPY (EGD) WITH PROPOFOL N/A 06/19/2020   Procedure:  ESOPHAGOGASTRODUODENOSCOPY (EGD) WITH PROPOFOL;  Surgeon: Wyline Mood, MD;  Location: [facility] ENDOSCOPY;  Service: Gastroenterology;  Laterality: N/A;   groin surgery     as a child   PORTA CATH INSERTION N/A 06/28/2020   Procedure: PORTA CATH INSERTION;  Surgeon: Annice Needy, MD;  Location: [facility] INVASIVE CV LAB;  Service: Cardiovascular;  Laterality: N/A;   SHOULDER SURGERY     UPPER EXTREMITY VENOGRAPHY Right 07/24/2021   Procedure: UPPER EXTREMITY VENOGRAPHY;  Surgeon: Renford Dills, MD;  Location: [facility] INVASIVE CV LAB;  Service: Cardiovascular;  Laterality: Right;    Social History   Socioeconomic History   Marital status: Married    Spouse name: Not on file   Number of children: 2   Years of education: Not on file   Highest education level: Not on file  Occupational History   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  Tobacco Use   Smoking status: Former    Types: Cigarettes    Quit date: 07/08/1988    Years since quitting: 33.1   Smokeless tobacco: Never  Vaping Use   Vaping Use: Never used  Substance and Sexual Activity   Alcohol use: No    Comment: recovering alcoholic for  13 years   Drug use: No   Sexual activity: Not Currently  Other Topics Concern   Not on file  Social History Narrative   Has living will--needs to get notarized   Daughter should be health care POA (wife with dementia)   Would accept resuscitation   Would accept tube feeds depending on the circumstance   Social Determinants of Health   Financial Resource Strain: Not on file  Food Insecurity: Not on file  Transportation Needs: Not on file  Physical Activity: Not on file  Stress: Not on file  Social Connections: Not on file  Intimate Partner Violence: Not on file    Family History  Problem Relation Age of Onset   Stomach cancer Mother    Diabetes Brother    Hypertension Brother    Hypertension Father    Prostate cancer Father    Colon cancer Sister    Cancer  Other        either cervical or ovarian, d. 56s   Brain cancer Sister      Current Outpatient Medications:    dexamethasone (DECADRON) 4 MG tablet, Take 1 tablet (4 mg total) by mouth 2 (two) times daily., Disp: 28 tablet, Rfl: 0   fentaNYL (DURAGESIC) 12 MCG/HR, PLACE 1 PATCH ONTO THE SKIN EVERY 3 DAYS, Disp: 10 patch, Rfl: 0   lidocaine-prilocaine (EMLA) cream, Apply 1 application topically as needed. Apply small amount to port site at least 1 hour prior to it being accessed, cover with plastic wrap, Disp: 30 g, Rfl: 3   Oxycodone HCl 10 MG TABS, Take 1 tablet (10 mg total) by mouth every 4 (four) hours as needed., Disp: 120 tablet, Rfl: 0   pantoprazole (PROTONIX) 40 MG tablet, TAKE 1 TABLET BY MOUTH TWICE A DAY BEFORE MEALS, Disp: 60 tablet, Rfl: 11   triamcinolone cream (KENALOG) 0.1 %, Apply 1 application topically 3 (three) times daily as needed., Disp: 300 each, Rfl: 1 No current facility-administered medications for this visit.  Facility-Administered Medications Ordered in Other Visits:    acetaminophen (TYLENOL) tablet 650 mg, 650 mg, Oral, Once, Creig Hines, MD   dexamethasone (DECADRON) 10 mg in sodium chloride 0.9 % 50 mL IVPB, 10 mg, Intravenous, Once, Creig Hines, MD   diphenhydrAMINE (BENADRYL) capsule 50 mg, 50 mg, Oral, Once, Creig Hines, MD   fam-trastuzumab deruxtecan-nxki (ENHERTU) 574 mg in dextrose 5 % 100 mL chemo infusion, 6.4 mg/kg (Treatment Plan Recorded), Intravenous, Once, Creig Hines, MD   heparin lock flush 100 UNIT/ML injection, , , ,    heparin lock flush 100 unit/mL, 500 Units, Intracatheter, Once PRN, Creig Hines, MD   palonosetron (ALOXI) injection 0.25 mg, 0.25 mg, Intravenous, Once, Creig Hines, MD  Physical exam:  Vitals:   08/06/21 0826  BP: (!) 147/69  Pulse: 61  Resp: 18  Temp: 99.1 F (37.3 C)  TempSrc: Tympanic  SpO2: 99%  Weight: 195 lb (88.5 kg)  Height: 5\' 11"  (1.803 m)   Physical Exam Constitutional:       General: He is not in acute distress.    Comments: He is wearing glasses with his right eye covered  Cardiovascular:     Rate and Rhythm: Normal rate and regular rhythm.     Heart sounds: Normal heart sounds.  Pulmonary:     Effort: Pulmonary effort is normal.     Breath sounds: Normal breath sounds.  Abdominal:     General: Bowel sounds are normal.  Palpations: Abdomen is soft.  Musculoskeletal:     Comments: Right upper extremity is diffusely swollen.  Bilateral +1 edema feet  Skin:    General: Skin is warm and dry.  Neurological:     Mental Status: He is alert and oriented to person, place, and time.     CMP Latest Ref Rng & Units 08/06/2021  Glucose 70 - 99 mg/dL 956(L)  BUN 8 - 23 mg/dL 23  Creatinine 8.75 - 6.43 mg/dL 3.29  Sodium 518 - 841 mmol/L 133(L)  Potassium 3.5 - 5.1 mmol/L 3.8  Chloride 98 - 111 mmol/L 101  CO2 22 - 32 mmol/L 22  Calcium 8.9 - 10.3 mg/dL 8.9  Total Protein 6.5 - 8.1 g/dL 6.6  Total Bilirubin 0.3 - 1.2 mg/dL 1.1  Alkaline Phos 38 - 126 U/L 147(H)  AST 15 - 41 U/L 32  ALT 0 - 44 U/L 61(H)   CBC Latest Ref Rng & Units 08/06/2021  WBC 4.0 - 10.5 K/uL 12.0(H)  Hemoglobin 13.0 - 17.0 g/dL 12.3(L)  Hematocrit 39.0 - 52.0 % 38.3(L)  Platelets 150 - 400 K/uL 163    No images are attached to the encounter.  MR Brain W Wo Contrast  Result Date: 07/29/2021 CLINICAL DATA:  66 year old male with metastatic esophageal cancer. Double vision. Brain/dural metastases. EXAM: MRI HEAD WITHOUT AND WITH CONTRAST TECHNIQUE: Multiplanar, multiecho pulse sequences of the brain and surrounding structures were obtained without and with intravenous contrast. CONTRAST:  10mL GADAVIST GADOBUTROL 1 MMOL/ML IV SOLN COMPARISON:  Brain MRI 06/22/2021 and earlier. FINDINGS: Brain: Substantial regression of the large, lobulated dural-based mass centered at the torcula and eccentric to the left. Residual lobulated enhancing mass there now superimposed on some T1 intrinsic  hemorrhage in the adjacent occipital poles. Some indistinct margins of the mass but overall now 16 x 21 x 23 mm (AP by transverse by CC) versus 40 x 34 x 30 mm last month. The continued abnormal bilateral pachymeningeal dural thickening and enhancement, smooth and slightly greater on the left. Associated left side (versus left > right) subdural hematoma, most pronounced along the anterior left frontal convexity, is stable measuring up to 11 mm thickness, with stable mild intracranial mass effect, no significant midline shift. Bilateral occipital lobe vasogenic edema has regressed but not resolved. No new abnormal intracranial enhancement identified. No superimposed restricted diffusion to suggest acute infarction, ventriculomegaly, or new intracranial hemorrhage. Cervicomedullary junction and pituitary are within normal limits. Vascular: Major intracranial vascular flow voids appear stable since last month. 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 Skull and upper cervical spine: Mildly expansile heterogeneously enhancing metastasis of the bony clivus (series 9, image 10) has progressed from both prior studies, now up to 2 cm long axis, but is not associated with significant basilar dural thickening at this time. Note evidence of hypercellularity on diffusion series 5, image 11. No other destructive bone lesion identified. Negative visible upper cervical spinal cord. Sinuses/Orbits: Abnormal right globe with superior T1 bright, T2 dark vitreous mass (series 18, image 25) is stable. No other intraorbital mass or inflammation. Paranasal Visualized paranasal sinuses and mastoids are stable and well aerated. Other: Visible internal auditory structures appear normal. IMPRESSION: 1. Substantial regression of lobulated dural-based torcula mass since last month. Residual is 1.6 x 2.1 x 2.3 cm. Associated chronic hemorrhage in the occipital poles but regressed occipital vasogenic edema. Continued  occlusion of the superior sagittal sinus and torcula. 2. Stable suspicious pachymeningeal thickening left > right.  Stable left side subdural hematoma up to 11 mm thickness. No midline shift or significant intracranial mass effect at this time. No new brain or dural metastasis identified. 3. Progressed bone metastasis destroying the clivus, approximately 2 cm. 4. Stable abnormal right globe, vitreous metastasis versus hemorrhage. Electronically Signed   By: Odessa Fleming M.D.   On: 07/29/2021 09:46   CT ABDOMEN PELVIS W CONTRAST  Result Date: 07/19/2021 CLINICAL DATA:  Esophageal cancer with liver metastasis. Long chemotherapy. Right upper extremity swelling. Evaluate for vascular occlusion. EXAM: CTV  CHEST, CT ABDOMEN, AND PELVIS WITH CONTRAST TECHNIQUE: Multidetector CT imaging of the chest, abdomen and pelvis was performed following the standard protocol during bolus administration of intravenous contrast. Chest imaging was timed to optimally visualize the venous system. RADIATION DOSE REDUCTION: This exam was performed according to the departmental dose-optimization program which includes automated exposure control, adjustment of the mA and/or kV according to patient size and/or use of iterative reconstruction technique. CONTRAST:  OMNIPAQUE IOHEXOL 350 MG/ML SOLN COMPARISON:  05/22/2021 PET.  Diagnostic CTs 04/16/2021. FINDINGS: CT CHEST FINDINGS Cardiovascular: Right IJ Port-A-Cath tip at superior caval/atrial junction. Aortic atherosclerosis. Patent SVC and brachiocephalic veins. Patent axillary veins. Normal heart size, without pericardial effusion. No central pulmonary embolism, on this non-dedicated study. Mediastinum/Nodes: No supraclavicular adenopathy. 1.1 cm right axillary node on 25/2 is significantly enlarged compared to the prior exam. A more anterior right axillary 1.0 cm node on 20/2 is new or significantly enlarged since the prior. No mediastinal adenopathy. Right hilar node of 1.2 cm is new  on 37/2. Left hilar developing adenopathy at 1.6 cm on 29/2. No dominant esophageal mass. Lungs/Pleura: Trace right greater than left pleural effusions are not significantly changed. Mild centrilobular emphysema. New and enlarging pulmonary nodules. A nodule along the right minor fissure measures 2.4 x 1.6 cm on 98/4 and is new since the prior diagnostic CT. Right lower lobe pleural-based nodules, including at 2.1 cm on 117/4, new. The left lower lobe pulmonary nodule described on 04/16/2021 CT has likely resolved. There are however, new left lower lobe pulmonary nodules including a 9 mm nodule on 95/4. Musculoskeletal: Left chest wall 9 mm subcutaneous nodule on 57/2 is increased from 7 mm on the prior. A skin lesion in the anterior right chest wall of 1.5 cm on 48/2 is unchanged and favored to represent a sebaceous cyst. Anterior right fifth rib is subtly expansile with overlying soft tissue thickening including on 44/2, new. Incompletely imaged soft tissue fullness surrounding the right shoulder including on 15/2. This is at the site of a comminuted proximal humerus fracture on the prior CT. This area is more completely imaged on coronal image 37. Also sagittal image 1. CT ABDOMEN PELVIS FINDINGS Hepatobiliary: Cirrhosis. High right hepatic lobe hypoattenuating 2.5 cm lesion on 27/2 is at the site of a subcentimeter lesion on the prior. Segment 4A 1.5 cm lesion on 29/2 is either new or significantly increased since the prior. Normal gallbladder, without biliary ductal dilatation. Pancreas: Normal, without mass or ductal dilatation. Spleen: Mild splenomegaly at 14.7 cm craniocaudal. Adrenals/Urinary Tract: Normal adrenal glands. Punctate lower pole right renal collecting system calculus. Normal left kidney. No hydronephrosis. Normal urinary bladder. Stomach/Bowel: Normal stomach, without wall thickening. Normal colon, appendix, and terminal ileum. Normal small bowel. Vascular/Lymphatic: Aortic atherosclerosis.  Patent portal and splenic veins. No abdominopelvic adenopathy. Reproductive: Mild prostatomegaly. Other: No significant free fluid. Small fat containing left inguinal hernia. No free intraperitoneal air. Musculoskeletal: No acute osseous abnormality. Schmorl's node  deformity at L3 and L5. Convex left lumbar spine curvature. IMPRESSION: CT CHEST IMPRESSION 1. Significant progression of pulmonary metastasis. 2. Development of thoracic adenopathy, highly suspicious for nodal metastasis. 3. No evidence of IVC or central venous thrombosis. 4. Incompletely imaged, significant soft tissue fullness surrounding the right shoulder in the setting of prior comminuted fracture. Consider dedicated imaging (contrast enhanced CT or MRI) and clinical correlation to exclude pathologic fracture with soft tissue component. 5. Right axillary developing adenopathy which could be reactive or metastatic given the appearance of the incompletely imaged right shoulder. 6. Trace bilateral pleural effusions, similar. 7. Aortic atherosclerosis (ICD10-I70.0) and emphysema (ICD10-J43.9). 8. Nonspecific enlarging anterior left chest wall nodule for which metastatic disease is possible. Recommend attention on follow-up. 9. Suspect developing osseous metastasis within the anterior right fifth rib. CT ABDOMEN AND PELVIS IMPRESSION 1. Progressive hepatic metastasis. 2. Cirrhosis and mild splenomegaly. 3. Right nephrolithiasis. Electronically Signed   By: Jeronimo Greaves M.D.   On: 07/19/2021 16:58   PERIPHERAL VASCULAR CATHETERIZATION  Result Date: 07/24/2021 See surgical note for result.  US Venous Img Upper Uni Right(DVT)  Result Date: 07/17/2021 CLINICAL DATA:  Right arm swelling, pain EXAM: RIGHT UPPER EXTREMITY VENOUS DOPPLER ULTRASOUND TECHNIQUE: Gray-scale sonography with graded compression, as well as color Doppler and duplex ultrasound were performed to evaluate the upper extremity deep venous system from the level of the subclavian vein  and including the jugular, axillary, basilic, radial, ulnar and upper cephalic vein. Spectral Doppler was utilized to evaluate flow at rest and with distal augmentation maneuvers. COMPARISON:  None. FINDINGS: Contralateral Subclavian Vein: Respiratory phasicity is normal and symmetric with the symptomatic side. No evidence of thrombus. Normal compressibility. Internal Jugular Vein: No evidence of thrombus. Normal compressibility, respiratory phasicity and response to augmentation. Subclavian Vein: No evidence of thrombus. Normal compressibility, respiratory phasicity and response to augmentation. Axillary Vein: No evidence of thrombus. Normal compressibility, respiratory phasicity and response to augmentation. Cephalic Vein: No evidence of thrombus. Normal compressibility, respiratory phasicity and response to augmentation. Basilic Vein: No evidence of thrombus. Normal compressibility, respiratory phasicity and response to augmentation. Brachial Veins: No evidence of thrombus. Normal compressibility, respiratory phasicity and response to augmentation. Radial Veins: No evidence of thrombus. Normal compressibility, respiratory phasicity and response to augmentation. Ulnar Veins: No evidence of thrombus. Normal compressibility, respiratory phasicity and response to augmentation. IMPRESSION: No evidence of DVT within the right upper extremity. Electronically Signed   By: Judie Petit.  Shick M.D.   On: 07/17/2021 16:20   ECHOCARDIOGRAM COMPLETE  Result Date: 07/25/2021    ECHOCARDIOGRAM REPORT   Patient Name:   DUAYNE HERNANDEZGARCI Date of Exam: 07/25/2021 Medical Rec #:  [id number]      Height:       71.0 in Accession #:    [id number]     Weight:       202.6 lb Date of Birth:  1956-03-10     BSA:          2.120 m Patient Age:    65 years       BP:           142/78 mmHg Patient Gender: M              HR:           80 bpm. Exam Location:  [facility] Procedure: 2D Echo, Cardiac Doppler, Color Doppler and Strain Analysis Indications:      Z51.11 Encounter for antineoplastic chemotherapy  History:  Patient has prior history of Echocardiogram examinations, most                  recent 05/21/2021. Cancer.  Sonographer:     Cristela Blue Referring Phys:  [id number] [facility] C Teja Judice Diagnosing Phys: Debbe Odea MD  Sonographer Comments: Global longitudinal strain was attempted. IMPRESSIONS  1. There is a 17 x 16 mm mass in the LV apex. Consider CMR for better characterization of LV apical mass.. Left ventricular ejection fraction, by estimation, is 50 to 55%. Left ventricular ejection fraction by 2D MOD biplane is 51.7 %. The left ventricle has low normal function. The left ventricle has no regional wall motion abnormalities. Left ventricular diastolic parameters were normal.  2. Right ventricular systolic function is normal. The right ventricular size is normal.  3. Right atrial size was mildly dilated.  4. The mitral valve is normal in structure. Mild mitral valve regurgitation.  5. The aortic valve was not well visualized. Aortic valve regurgitation is not visualized.  6. The inferior vena cava is normal in size with greater than 50% respiratory variability, suggesting right atrial pressure of 3 mmHg. FINDINGS  Left Ventricle: There is a 17 x 16 mm mass in the LV apex. Consider CMR for better characterization of LV apical mass. Left ventricular ejection fraction, by estimation, is 50 to 55%. Left ventricular ejection fraction by 2D MOD biplane is 51.7 %. The left ventricle has low normal function. The left ventricle has no regional wall motion abnormalities. Global longitudinal strain performed but not reported based on interpreter judgement due to suboptimal tracking. 3D left ventricular ejection fraction analysis performed but not reported based on interpreter judgement due to suboptimal tracking. The left ventricular internal cavity size was normal in size. There is no left ventricular hypertrophy. Left ventricular diastolic parameters were  normal. Right Ventricle: The right ventricular size is normal. No increase in right ventricular wall thickness. Right ventricular systolic function is normal. Left Atrium: Left atrial size was normal in size. Right Atrium: Right atrial size was mildly dilated. Pericardium: There is no evidence of pericardial effusion. Mitral Valve: The mitral valve is normal in structure. Mild mitral valve regurgitation. MV peak gradient, 8.4 mmHg. The mean mitral valve gradient is 4.0 mmHg. Tricuspid Valve: The tricuspid valve is grossly normal. Tricuspid valve regurgitation is not demonstrated. Aortic Valve: The aortic valve was not well visualized. Aortic valve regurgitation is not visualized. Pulmonic Valve: The pulmonic valve was not well visualized. Pulmonic valve regurgitation is not visualized. Aorta: The aortic root is normal in size and structure. Venous: The inferior vena cava is normal in size with greater than 50% respiratory variability, suggesting right atrial pressure of 3 mmHg. IAS/Shunts: No atrial level shunt detected by color flow Doppler.  LEFT VENTRICLE PLAX 2D                        Biplane EF (MOD) LVIDd:         5.45 cm         LV Biplane EF:   Left LVIDs:         4.10 cm                          ventricular LV PW:         1.20 cm  ejection LV IVS:        1.00 cm                          fraction by LVOT diam:     2.20 cm                          2D MOD LV SV:         56                               biplane is LV SV Index:   27                               51.7 %. LVOT Area:     3.80 cm                                Diastology                                LV e' medial:    10.30 cm/s LV Volumes (MOD)               LV E/e' medial:  10.3 LV vol d, MOD    139.0 ml      LV e' lateral:   7.94 cm/s A2C:                           LV E/e' lateral: 13.4 LV vol d, MOD    157.0 ml A4C: LV vol s, MOD    67.9 ml A2C: LV vol s, MOD    75.9 ml       3D Volume EF: A4C:                            3D EF:        53 % LV SV MOD A2C:   71.1 ml       LV EDV:       229 ml LV SV MOD A4C:   157.0 ml      LV ESV:       108 ml LV SV MOD BP:    76.8 ml       LV SV:        120 ml RIGHT VENTRICLE TAPSE (M-mode): 4.0 cm LEFT ATRIUM             Index        RIGHT ATRIUM           Index LA diam:        3.20 cm 1.51 cm/m   RA Area:     23.30 cm LA Vol (A2C):   79.4 ml 37.45 ml/m  RA Volume:   73.10 ml  34.48 ml/m LA Vol (A4C):   27.4 ml 12.92 ml/m LA Biplane Vol: 50.9 ml 24.01 ml/m  AORTIC VALVE             PULMONIC VALVE LVOT Vmax:   68.50 cm/s  PV Vmax:        0.84 m/s LVOT Vmean:  42.400 cm/s PV Vmean:  59.000 cm/s LVOT VTI:    0.148 m     PV VTI:         0.153 m                          PV Peak grad:   2.8 mmHg AORTA                    PV Mean grad:   2.0 mmHg Ao Root diam: 3.43 cm    RVOT Peak grad: 4 mmHg  MITRAL VALVE MV Area (PHT): 3.23 cm     SHUNTS MV Area VTI:   2.07 cm     Systemic VTI:  0.15 m MV Peak grad:  8.4 mmHg     Systemic Diam: 2.20 cm MV Mean grad:  4.0 mmHg     Pulmonic VTI:  0.170 m MV Vmax:       1.45 m/s MV Vmean:      98.4 cm/s MV Decel Time: 235 msec MV E velocity: 106.00 cm/s MV A velocity: 71.60 cm/s MV E/A ratio:  1.48 Debbe Odea MD Electronically signed by Debbe Odea MD Signature Date/Time: 07/25/2021/1:53:34 PM    Final    CT VENOGRAM CHEST  Result Date: 07/19/2021 CLINICAL DATA:  Esophageal cancer with liver metastasis. Long chemotherapy. Right upper extremity swelling. Evaluate for vascular occlusion. EXAM: CTV  CHEST, CT ABDOMEN, AND PELVIS WITH CONTRAST TECHNIQUE: Multidetector CT imaging of the chest, abdomen and pelvis was performed following the standard protocol during bolus administration of intravenous contrast. Chest imaging was timed to optimally visualize the venous system. RADIATION DOSE REDUCTION: This exam was performed according to the departmental dose-optimization program which includes automated exposure control, adjustment of the mA and/or kV  according to patient size and/or use of iterative reconstruction technique. CONTRAST:  OMNIPAQUE IOHEXOL 350 MG/ML SOLN COMPARISON:  05/22/2021 PET.  Diagnostic CTs 04/16/2021. FINDINGS: CT CHEST FINDINGS Cardiovascular: Right IJ Port-A-Cath tip at superior caval/atrial junction. Aortic atherosclerosis. Patent SVC and brachiocephalic veins. Patent axillary veins. Normal heart size, without pericardial effusion. No central pulmonary embolism, on this non-dedicated study. Mediastinum/Nodes: No supraclavicular adenopathy. 1.1 cm right axillary node on 25/2 is significantly enlarged compared to the prior exam. A more anterior right axillary 1.0 cm node on 20/2 is new or significantly enlarged since the prior. No mediastinal adenopathy. Right hilar node of 1.2 cm is new on 37/2. Left hilar developing adenopathy at 1.6 cm on 29/2. No dominant esophageal mass. Lungs/Pleura: Trace right greater than left pleural effusions are not significantly changed. Mild centrilobular emphysema. New and enlarging pulmonary nodules. A nodule along the right minor fissure measures 2.4 x 1.6 cm on 98/4 and is new since the prior diagnostic CT. Right lower lobe pleural-based nodules, including at 2.1 cm on 117/4, new. The left lower lobe pulmonary nodule described on 04/16/2021 CT has likely resolved. There are however, new left lower lobe pulmonary nodules including a 9 mm nodule on 95/4. Musculoskeletal: Left chest wall 9 mm subcutaneous nodule on 57/2 is increased from 7 mm on the prior. A skin lesion in the anterior right chest wall of 1.5 cm on 48/2 is unchanged and favored to represent a sebaceous cyst. Anterior right fifth rib is subtly expansile with overlying soft tissue thickening including on 44/2, new. Incompletely imaged soft tissue fullness surrounding the right shoulder including on 15/2. This is at the site of a comminuted proximal humerus fracture on the prior CT. This  area is more completely imaged on coronal image  37. Also sagittal image 1. CT ABDOMEN PELVIS FINDINGS Hepatobiliary: Cirrhosis. High right hepatic lobe hypoattenuating 2.5 cm lesion on 27/2 is at the site of a subcentimeter lesion on the prior. Segment 4A 1.5 cm lesion on 29/2 is either new or significantly increased since the prior. Normal gallbladder, without biliary ductal dilatation. Pancreas: Normal, without mass or ductal dilatation. Spleen: Mild splenomegaly at 14.7 cm craniocaudal. Adrenals/Urinary Tract: Normal adrenal glands. Punctate lower pole right renal collecting system calculus. Normal left kidney. No hydronephrosis. Normal urinary bladder. Stomach/Bowel: Normal stomach, without wall thickening. Normal colon, appendix, and terminal ileum. Normal small bowel. Vascular/Lymphatic: Aortic atherosclerosis. Patent portal and splenic veins. No abdominopelvic adenopathy. Reproductive: Mild prostatomegaly. Other: No significant free fluid. Small fat containing left inguinal hernia. No free intraperitoneal air. Musculoskeletal: No acute osseous abnormality. Schmorl's node deformity at L3 and L5. Convex left lumbar spine curvature. IMPRESSION: CT CHEST IMPRESSION 1. Significant progression of pulmonary metastasis. 2. Development of thoracic adenopathy, highly suspicious for nodal metastasis. 3. No evidence of IVC or central venous thrombosis. 4. Incompletely imaged, significant soft tissue fullness surrounding the right shoulder in the setting of prior comminuted fracture. Consider dedicated imaging (contrast enhanced CT or MRI) and clinical correlation to exclude pathologic fracture with soft tissue component. 5. Right axillary developing adenopathy which could be reactive or metastatic given the appearance of the incompletely imaged right shoulder. 6. Trace bilateral pleural effusions, similar. 7. Aortic atherosclerosis (ICD10-I70.0) and emphysema (ICD10-J43.9). 8. Nonspecific enlarging anterior left chest wall nodule for which metastatic disease is  possible. Recommend attention on follow-up. 9. Suspect developing osseous metastasis within the anterior right fifth rib. CT ABDOMEN AND PELVIS IMPRESSION 1. Progressive hepatic metastasis. 2. Cirrhosis and mild splenomegaly. 3. Right nephrolithiasis. Electronically Signed   By: Jeronimo Greaves M.D.   On: 07/19/2021 16:58     Assessment and plan- Patient is a 66 y.o. male with metastatic GE junction adenocarcinoma here for on treatment assessment prior to cycle 1 of second line Enhertu treatment  Counts okay to proceed with Enhertu treatment today which will be given with a palliative intent.  Discussed risks and benefits of Enhertu including all but not limited to possible risk of pneumonitis and cardiotoxicity.  We have sent NGS testing on his peripheral blood to see if he is continuing to remain HER2 positive or not.  If these tests show that he is HER2 negative I will stop Enhertu treatment for cycle 2 and switch him to palliative chemotherapy likely FOLFIRI.  Given that he has significant disease burden and rapid progression of disease and proceeding with Enhertu treatment today.  Treatment will be given with a palliative intent.  Patient and his daughter verbalized understanding.  Echocardiogram was normal.  He was seen by Dr. Kirke Corin from cardiology as well.  There was a small LV mass noted which does not appear to be a thrombus and could be calcified trabeculation.  Plan is to monitor this when we repeat his echocardiogram again in 3 months time.  Neoplasm related pain: Continue fentanyl patch and as needed oxycodone  Right upper extremity swelling: He has not been deemed to be an operative candidate by [facility] orthopedic oncology.  They have recommended compression sleeve and we will have him see Leona Carry for this  Right-sided vision loss: Likely secondary to intracoronary nitro.  He was also found to have chronic hemorrhage in the occipital lobes but given that his visual loss is only on the  right  side I suspect is secondary to intrachorodial met rather than occipital hemorrhage that is chronic  I will see him back in 3 weeks for cycle 2 of treatment  Patient and his daughter understand that his overall prognosis is poor.  If he progresses on second line chemotherapy/Enhertu I would recommend hospice at that time   Visit Diagnosis 1. Esophageal adenocarcinoma (HCC)   2. Encounter for monoclonal antibody treatment for malignancy   3. Goals of care, counseling/discussion      Dr. Owens Shark, MD, MPH [facility] [phone] 08/06/2021 9:23 AM

## 2021-08-06 NOTE — Patient Instructions (Signed)
[facility] [place]  Discharge Instructions: Thank you for choosing [facility] to provide your oncology and hematology care.  If you have a lab appointment with the [facility], please go directly to the [facility] and check in at the registration area.  Wear comfortable clothing and clothing appropriate for easy access to any Portacath or PICC line.   We strive to give you quality time with your provider. You may need to reschedule your appointment if you arrive late (15 or more minutes).  Arriving late affects you and other patients whose appointments are after yours.  Also, if you miss three or more appointments without notifying the office, you may be dismissed from the clinic at the providers discretion.      For prescription refill requests, have your pharmacy contact our office and allow 72 hours for refills to be completed.    Today you received the following chemotherapy and/or immunotherapy agents Enhertu  Fam-Trastuzumab deruxtecan injection What is this medication? TRASTUZUMAB DERUXTECAN (tras TOOZ eu mab DER ux TEE kan) is a chemotherapy medicine and a monoclonal antibody. It treats certain types of cancer. Some of the cancers treated are breast cancer and gastric cancer. This medicine may be used for other purposes; ask your health care provider or pharmacist if you have questions. COMMON BRAND NAME(S): ENHERTU What should I tell my care team before I take this medication? They need to know if you have any of these conditions: heart disease heart failure infection (especially a virus infection such as chickenpox, cold sores, or herpes) liver disease lung or breathing disease, like asthma an unusual or allergic reaction to fam-trastuzumab deruxtecan, other medications, foods, dyes, or preservatives pregnant or trying to get pregnant breast-feeding How should I use this medication? This medicine is for infusion into a vein. It is  given by a health care professional in a hospital or clinic setting. Talk to your pediatrician regarding the use of this medicine in children. Special care may be needed. Overdosage: If you think you have taken too much of this medicine contact a poison control center or emergency room at once. NOTE: This medicine is only for you. Do not share this medicine with others. What if I miss a dose? It is important not to miss your dose. Call your doctor or health care professional if you are unable to keep an appointment. What may interact with this medication? Interaction studies have not been performed. This list may not describe all possible interactions. Give your health care provider a list of all the medicines, herbs, non-prescription drugs, or dietary supplements you use. Also tell them if you smoke, drink alcohol, or use illegal drugs. Some items may interact with your medicine. What should I watch for while using this medication? Visit your healthcare professional for regular checks on your progress. Tell your healthcare professional if your symptoms do not start to get better or if they get worse. Your condition will be monitored carefully while you are receiving this medicine. Do not become pregnant while taking this medicine or for 7 months after stopping it. Women should inform their healthcare professional if they wish to become pregnant or think they might be pregnant. Men should not father a child while taking this medicine and for 4 months after stopping it. There is potential for serious side effects to an unborn child. Talk to your healthcare professional for more information. Do not breast-feed an infant while taking this medicine or for 7 months  after the last dose. This medicine has caused decreased sperm counts in some men. This may make it more difficult to father a child. Talk to your healthcare professional if you are concerned about your fertility. This medicine may increase your  risk to bruise or bleed. Call your health care professional if you notice any unusual bleeding. Be careful brushing or flossing your teeth or using a toothpick because you may get an infection or bleed more easily. If you have any dental work done, tell your dentist you are receiving this medicine. This medicine may cause dry eyes [and blurred vision]. If you wear contact lenses, you may feel some discomfort. Lubricating eye drops may help. See your healthcare professional if the problem does not go away or is severe. Call your healthcare professional for advice if you get a fever, chills, or sore throat, or other symptoms of a cold or flu. Do not treat yourself. This medicine decreases your body's ability to fight infections. Try to avoid being around people who are sick. Avoid taking medicines that contain aspirin, acetaminophen, ibuprofen, naproxen, or ketoprofen unless instructed by your healthcare professional. These medicines may hide a fever. What side effects may I notice from receiving this medication? Side effects that you should report to your doctor or health care professional as soon as possible: allergic reactions like skin rash, itching or hives, swelling of the face, lips, or tongue breathing problems cough nausea, vomiting signs and symptoms of bleeding such as bloody or black, tarry stools; red or dark-brown urine; spitting up blood or brown material that looks like coffee grounds; red spots on the skin; unusual bruising or bleeding from the eye, gums, or nose signs and symptoms of heart failure like breathing problems, fast, irregular heartbeat, sudden weight gain; swelling of the ankles, feet, hands; unusually weak or tired signs and symptoms of infection like fever; chills; cough; sore throat; pain or trouble passing urine signs and symptoms of low red blood cells or anemia such as unusually weak or tired; feeling faint or lightheaded; falls; breathing problems Side effects that  usually do not require medical attention (report these to your doctor or health care professional if they continue or are bothersome): constipation diarrhea dry eyes hair loss loss of appetite mouth sores rash This list may not describe all possible side effects. Call your doctor for medical advice about side effects. You may report side effects to FDA at 1-800-FDA-1088. Where should I keep my medication? This drug is given in a hospital or clinic and will not be stored at home. NOTE: This sheet is a summary. It may not cover all possible information. If you have questions about this medicine, talk to your doctor, pharmacist, or health care provider.  2022 Elsevier/Gold Standard (2021-03-13 00:00:00)    To help prevent nausea and vomiting after your treatment, we encourage you to take your nausea medication as directed.  BELOW ARE SYMPTOMS THAT SHOULD BE REPORTED IMMEDIATELY: *FEVER GREATER THAN 100.4 F (38 C) OR HIGHER *CHILLS OR SWEATING *NAUSEA AND VOMITING THAT IS NOT CONTROLLED WITH YOUR NAUSEA MEDICATION *UNUSUAL SHORTNESS OF BREATH *UNUSUAL BRUISING OR BLEEDING *URINARY PROBLEMS (pain or burning when urinating, or frequent urination) *BOWEL PROBLEMS (unusual diarrhea, constipation, pain near the anus) TENDERNESS IN MOUTH AND THROAT WITH OR WITHOUT PRESENCE OF ULCERS (sore throat, sores in mouth, or a toothache) UNUSUAL RASH, SWELLING OR PAIN  UNUSUAL VAGINAL DISCHARGE OR ITCHING   Items with * indicate a potential emergency and should be followed up as  soon as possible or go to the Emergency Department if any problems should occur.  Please show the CHEMOTHERAPY ALERT CARD or IMMUNOTHERAPY ALERT CARD at check-in to the Emergency Department and triage nurse.  Should you have questions after your visit or need to cancel or reschedule your appointment, please contact [facility] [place] [facility] [place]  [phone] and follow the prompts.  Office hours are 8:00 a.m.  to 4:30 p.m. Monday - Friday. Please note that voicemails left after 4:00 p.m. may not be returned until the following business day.  We are closed weekends and major holidays. You have access to a nurse at all times for urgent questions. Please call the main number to the clinic [phone] and follow the prompts.  For any non-urgent questions, you may also contact your provider using MyChart. We now offer e-Visits for anyone 51 and older to request care online for non-urgent symptoms. For details visit [email or web address].   Also download the MyChart app! Go to the app store, search "MyChart", open the app, select [place], and log in with your MyChart username and password.  Due to Covid, a mask is required upon entering the hospital/clinic. If you do not have a mask, one will be given to you upon arrival. For doctor visits, patients may have 1 support person aged 54 or older with them. For treatment visits, patients cannot have anyone with them due to current Covid guidelines and our immunocompromised population.

## 2021-08-07 ENCOUNTER — Telehealth: Payer: Self-pay

## 2021-08-07 DIAGNOSIS — I5189 Other ill-defined heart diseases: Secondary | ICD-10-CM

## 2021-08-07 LAB — CEA: CEA: 466 ng/mL — ABNORMAL HIGH (ref 0.0–4.7)

## 2021-08-07 NOTE — Telephone Encounter (Signed)
Patient made aware of Dr. Tyrell Antonio recommendation. Echo to be repeated in 3 months. Adv the patient that a scheduler will contact him to schedule. Patient verbalized understanding and voiced appreciation for the call.

## 2021-08-07 NOTE — Telephone Encounter (Signed)
-----   Message from Wellington Hampshire, MD sent at 08/06/2021  9:02 AM EST ----- Please let the patient know that I discussed his condition with Dr. [name] and recommend repeat echocardiogram in 3 months from now.   ----- Message ----- From: [name], MD Sent: 08/03/2021   8:33 AM EST To: Wellington Hampshire, MD  Thank you so much! ----- Message ----- From: Wellington Hampshire, MD Sent: 08/03/2021   7:50 AM EST To: [name], MD  Hi Dr. [name], The LV cardiac mass is very unlikely to be metastatic disease.  In addition, it appears to be too dense to be a thrombus and I suspect that most likely it is a calcified trabeculation.  I do not think a cardiac MRI is going to change his management and my recommendation would be to repeat echocardiogram in 3 months to ensure stability.

## 2021-08-08 ENCOUNTER — Other Ambulatory Visit: Payer: Self-pay

## 2021-08-08 ENCOUNTER — Inpatient Hospital Stay: Payer: Medicare Other | Admitting: Occupational Therapy

## 2021-08-08 DIAGNOSIS — M7989 Other specified soft tissue disorders: Secondary | ICD-10-CM | POA: Insufficient documentation

## 2021-08-08 DIAGNOSIS — Z87891 Personal history of nicotine dependence: Secondary | ICD-10-CM | POA: Insufficient documentation

## 2021-08-08 DIAGNOSIS — I890 Lymphedema, not elsewhere classified: Secondary | ICD-10-CM | POA: Insufficient documentation

## 2021-08-08 DIAGNOSIS — C155 Malignant neoplasm of lower third of esophagus: Secondary | ICD-10-CM | POA: Insufficient documentation

## 2021-08-08 DIAGNOSIS — Z5189 Encounter for other specified aftercare: Secondary | ICD-10-CM | POA: Insufficient documentation

## 2021-08-08 DIAGNOSIS — Z8042 Family history of malignant neoplasm of prostate: Secondary | ICD-10-CM | POA: Insufficient documentation

## 2021-08-08 DIAGNOSIS — Z5111 Encounter for antineoplastic chemotherapy: Secondary | ICD-10-CM | POA: Insufficient documentation

## 2021-08-08 DIAGNOSIS — C787 Secondary malignant neoplasm of liver and intrahepatic bile duct: Secondary | ICD-10-CM | POA: Insufficient documentation

## 2021-08-08 DIAGNOSIS — Z808 Family history of malignant neoplasm of other organs or systems: Secondary | ICD-10-CM | POA: Insufficient documentation

## 2021-08-08 DIAGNOSIS — Z510 Encounter for antineoplastic radiation therapy: Secondary | ICD-10-CM | POA: Insufficient documentation

## 2021-08-08 DIAGNOSIS — Z833 Family history of diabetes mellitus: Secondary | ICD-10-CM | POA: Insufficient documentation

## 2021-08-08 DIAGNOSIS — R1310 Dysphagia, unspecified: Secondary | ICD-10-CM | POA: Insufficient documentation

## 2021-08-08 DIAGNOSIS — Z8249 Family history of ischemic heart disease and other diseases of the circulatory system: Secondary | ICD-10-CM | POA: Insufficient documentation

## 2021-08-08 DIAGNOSIS — R911 Solitary pulmonary nodule: Secondary | ICD-10-CM | POA: Insufficient documentation

## 2021-08-08 DIAGNOSIS — Z800 Family history of malignant neoplasm of digestive organs: Secondary | ICD-10-CM | POA: Insufficient documentation

## 2021-08-08 DIAGNOSIS — R5383 Other fatigue: Secondary | ICD-10-CM | POA: Insufficient documentation

## 2021-08-08 DIAGNOSIS — Z79899 Other long term (current) drug therapy: Secondary | ICD-10-CM | POA: Insufficient documentation

## 2021-08-08 NOTE — Telephone Encounter (Signed)
Echo sch 11/05/21

## 2021-08-08 NOTE — Therapy (Signed)
[facility] [place], [place], [place], [address] Phone: [phone]   Fax:  [phone]  Occupational Therapy Screen:  Patient Details  Name: Cory [name] MRN: [id number] Date of Birth: January 23, 1956 No data recorded  Encounter Date: 08/08/2021   OT End of Session - 08/08/21 1530     Visit Number 0             Past Medical History:  Diagnosis Date   Esophageal adenocarcinoma ([place]) 06/23/2020   Family history of brain cancer    Family history of colon cancer    Family history of melanoma    Family history of stomach cancer    GERD (gastroesophageal reflux disease)    History of kidney stones    Nephrolithiasis    [facility]   Pancreatitis, acute    Personal history of colonic polyps    Dr Nicolasa Ducking   Psoriasis     Past Surgical History:  Procedure Laterality Date   COLONOSCOPY     COLONOSCOPY WITH PROPOFOL N/A 03/31/2020   Procedure: COLONOSCOPY WITH PROPOFOL;  Surgeon: Jonathon Bellows, MD;  Location: [facility] ENDOSCOPY;  Service: Gastroenterology;  Laterality: N/A;   ERCP     ESOPHAGOGASTRODUODENOSCOPY (EGD) WITH PROPOFOL N/A 06/19/2020   Procedure: ESOPHAGOGASTRODUODENOSCOPY (EGD) WITH PROPOFOL;  Surgeon: Jonathon Bellows, MD;  Location[facility] ENDOSCOPY;  Service: Gastroenterology;  Laterality: N/A;   groin surgery     as a child   PORTA CATH INSERTION N/A 06/28/2020   Procedure: PORTA CATH INSERTION;  Surgeon: Algernon Huxley, MD;  Location: [place] CV LAB;  Service: Cardiovascular;  Laterality: N/A;   SHOULDER SURGERY     UPPER EXTREMITY VENOGRAPHY Right 07/24/2021   Procedure: UPPER EXTREMITY VENOGRAPHY;  Surgeon: Katha Cabal, MD;  Location: [place] [facility];  Service: Cardiovascular;  Laterality: Right;    There were no vitals filed for this visit.   Subjective Assessment - 08/08/21 1527     Subjective  I washing pushing something in about Aug last year and got fracture- and then hurt it again in  Dec- but about 2 wks ago my arm started swelling -and this part at the top got hard- cannot lift my arm up- but I can make fist , move my wrist and forearm /elbow - pain in the upper arm but not bad                 LYMPHEDEMA/ONCOLOGY QUESTIONNAIRE - 08/08/21 0001       Right Upper Extremity Lymphedema   15 cm Proximal to Olecranon Process 35 cm    10 cm Proximal to Olecranon Process 32 cm    Olecranon Process 33 cm    15 cm Proximal to Ulnar Styloid Process 31 cm    10 cm Proximal to Ulnar Styloid Process 27 cm    Just Proximal to Ulnar Styloid Process 22.5 cm    Across Hand at [employer] 25.6 cm    At [place] of 2nd Digit 7 cm    At [facility] of Thumb 8 cm      Left Upper Extremity Lymphedema   15 cm Proximal to Olecranon Process 30.5 cm    10 cm Proximal to Olecranon Process 28.5 cm    Olecranon Process 28 cm    15 cm Proximal to Ulnar Styloid Process 27 cm    10 cm Proximal to Ulnar Styloid Process 22.5 cm    Just Proximal to Ulnar Styloid Process  18.5 cm    Across Hand at [employer] 22 cm    At [place] of 2nd Digit 7.4 cm    At [facility] of Thumb 7.4 cm               Assessment and plan DR RAO on 08/06/21 - Patient is a 66 y.o. male with metastatic GE junction adenocarcinoma here for on treatment assessment prior to cycle 1 of second line Enhertu treatment   Counts okay to proceed with Enhertu treatment today which will be given with a palliative intent.  Discussed risks and benefits of Enhertu including all but not limited to possible risk of pneumonitis and cardiotoxicity.  We have sent NGS testing on his peripheral blood to see if he is continuing to remain HER2 positive or not.  If these tests show that he is HER2 negative I will stop Enhertu treatment for cycle 2 and switch him to palliative chemotherapy likely FOLFIRI.  Given that he has significant disease burden and rapid progression of disease and proceeding with Enhertu treatment today.  Treatment will be given  with a palliative intent.  Patient and his daughter verbalized understanding.  Echocardiogram was normal.  He was seen by Dr. Fletcher Anon from cardiology as well.  There was a small LV mass noted which does not appear to be a thrombus and could be calcified trabeculation.  Plan is to monitor this when we repeat his echocardiogram again in 3 months time.   Neoplasm related pain: Continue fentanyl patch and as needed oxycodone   Right upper extremity swelling: He has not been deemed to be an operative candidate by [facility] orthopedic oncology.  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  If he progresses on second line chemotherapy/Enhertu I would recommend hospice at that time   (Patient noted to have significant swelling in his right upper extremity.  CT venogram did not show any vascular stenosis and patient was seen by vascular surgery.  Swelling has been attributed secondary to his fracture that he has had in the past following a fall which has not been operated upon and was allowed to heal by secondary intent)   Patient seen at [facility] Dr. Oralia Rud for second opinion.  They agreed upon Enhertu for second line treatment if he continues to remain HER2 positive on repeat testing   Interval history-patient is unable to see from his right eye at this point.  He does not drive anymore.  Continues to have right upper extremity swelling.  Pain is relatively well controlled with fentanyl patch and as needed oxycodone.     OT SCREEN 08/08/21:  Circumference taken  for bilateral UE - R dominant UE increase by 6.5 cm upper arm, elbow 5 cm , forearm 6 cm , wrist 4 cm and hand 3 cm compare to L.  Fibrosis of upper arm and hard - decrease shoulder AROM , elbow flexion end range limited by swelling but sup/pro, wrist and hand WFL Would not recommend at this time for  pt to try and donn 20-54mHG over the counter compression sleeve -do not have the strength and will reinjured UE -and  compression sleeve do not decrease but maintain. Done Isotoner glove with pt and Tubigrip D hand to elbow and tubigrip F from wrist to upper arm -about 965mg and had trouble to donn that - daughter is at home with him until Friday to assist. Did recommend for them to do one layer bandage for day or 2 - with isotoner glove, soft stockinet and 10 cm short stretch hand and wrist 2 x  wrap and over lap 50% to upper arm. And then try again tubigip D and F with isotoner glove - wear light compression most of the time And ed on soft MLD on upper  arm over top of shoulder -and daughter ed on light fibrosis tech on upper arm prior to MLD   Return in week for follow up                        Patient will benefit from skilled therapeutic intervention in order to improve the following deficits and impairments:           Visit Diagnosis: Lymphedema, not elsewhere classified    Problem List Patient Active Problem List   Diagnosis Date Noted   Lymphedema 07/20/2021   Swelling of arm 07/17/2021   Great toe pain, left 02/06/2021   Advance directive discussed with patient 02/06/2021   Genetic testing 09/07/2020   Family history of colon cancer    Family history of brain cancer    Family history of stomach cancer    Family history of melanoma    Goals of care, counseling/discussion 06/23/2020   [place]   Esophageal dysphagia 05/26/2020   Erectile dysfunction 09/20/2013   Routine general medical examination at a health care  facility 09/13/2011   Psoriasis 09/13/2011   ALLERGIC RHINITIS 04/28/2009   GERD 04/28/2009   COLONIC POLYPS, HX OF 04/28/2009    DuRosalyn GessOTR/L,CLT 08/08/2021, 3:32 PM  [place] [facility]hone: [phone] Fax:  [phone]: Cory DEAKINSRN: [id number] of Birth: [birth year]

## 2021-08-09 ENCOUNTER — Encounter: Payer: Self-pay | Admitting: Oncology

## 2021-08-09 ENCOUNTER — Ambulatory Visit (INDEPENDENT_AMBULATORY_CARE_PROVIDER_SITE_OTHER): Payer: Medicare Other | Admitting: Vascular Surgery

## 2021-08-10 ENCOUNTER — Other Ambulatory Visit: Payer: Self-pay

## 2021-08-10 ENCOUNTER — Ambulatory Visit
Admission: RE | Admit: 2021-08-10 | Discharge: 2021-08-10 | Disposition: A | Discharge status: Home / Self Care | Payer: Medicare Other | Source: Ambulatory Visit | Attending: Radiation Oncology | Admitting: Radiation Oncology

## 2021-08-10 VITALS — BP 149/82 | HR 87 | Resp 18 | Wt 194.2 lb

## 2021-08-10 DIAGNOSIS — C7931 Secondary malignant neoplasm of brain: Secondary | ICD-10-CM | POA: Insufficient documentation

## 2021-08-10 DIAGNOSIS — C787 Secondary malignant neoplasm of liver and intrahepatic bile duct: Secondary | ICD-10-CM | POA: Insufficient documentation

## 2021-08-10 DIAGNOSIS — C155 Malignant neoplasm of lower third of esophagus: Secondary | ICD-10-CM | POA: Diagnosis not present

## 2021-08-10 DIAGNOSIS — C7951 Secondary malignant neoplasm of bone: Secondary | ICD-10-CM | POA: Insufficient documentation

## 2021-08-10 NOTE — Progress Notes (Signed)
Radiation Oncology Follow up Note old patient new area right humerus metastatic disease  Name: Cory [name]   Date:   08/10/2021 MRN:  [id number] DOB: [birth year]    This 66 y.o. male presents to the clinic today for 1 month follow-up status post SRS.  For solitary brain metastasis.  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  Is having some problems with his vision of the right eye and continues to have the current narcotic dependent pain for metastatic disease to his right humerus.  He is also having persistent swelling of his right upper extremity with evidence of progressive lymph node involvement of his right axilla.  PET CT scan in November showed resolution of activity through the esophagus resolution of multifocal hypermetabolic hepatic metastasis.  He did have intense metabolic activity associated with subacute right humeral fracture which seems to be pathologic.  Recent CT scan again showed progressive hepatic metastasis as well as development of thoracic adenopathy and again evidence of right axillary adenopathy which could be reactive or metastatic.  Recent MRI scan of his brain shows substantial regression of the lobulated dural based mass.  He does have evidence of metastatic disease destroying the clivus.  Right globe showed vitreous metastasis versus hemorrhage.  COMPLICATIONS OF TREATMENT: none  FOLLOW UP COMPLIANCE: keeps appointments   PHYSICAL EXAM:  BP (!) 149/82 (BP Location: Left Arm, Patient Position: Sitting)    Pulse 87    Resp 18    Wt 194 lb 3.2 oz (88.1 kg)    BMI 27.09 kg/m  Right arm has significant edema.  Range of motion is difficult secondary to pain.  Well-developed  well-nourished patient in NAD. HEENT reveals PERLA, EOMI, discs not visualized.  Oral cavity is clear. No oral mucosal lesions are identified. Neck is clear without evidence of cervical or supraclavicular adenopathy. Lungs are clear to A&P. Cardiac examination is essentially unremarkable with regular rate and rhythm without murmur rub or thrill. Abdomen is benign with no organomegaly or masses noted. Motor sensory and DTR levels are equal and symmetric in the upper and lower extremities. Cranial nerves II through XII are grossly intact. Proprioception is intact. No peripheral adenopathy or edema is identified. No motor or sensory levels are noted. Crude visual fields are within normal range.  RADIOLOGY RESULTS: PET/CT CT scans and MRI of brain reviewed compatible with above-stated findings  PLAN: This time it go ahead with the palliative course of radiation therapy to his right humerus as well as the axillary nodes in his right axilla.  We will plan on delivering 30 Gray in 10 fractions.  We will discuss this with Dr. [name] although I do not believe this will inhibit any of her treatments since this is a small confined space with very little bone marrow involvement.  Risks and benefits of radiation cleaning skin reaction were reviewed with the patient.  Patient and his son are anxious to get started treatment I have personally set up and ordered CT simulation for first thing next week.  I would like to take this opportunity to thank you for allowing me to participate in the care of your patient.Noreene Filbert, MD

## 2021-08-13 ENCOUNTER — Other Ambulatory Visit: Payer: Self-pay | Admitting: Oncology

## 2021-08-13 ENCOUNTER — Other Ambulatory Visit: Payer: Self-pay

## 2021-08-13 ENCOUNTER — Encounter (INDEPENDENT_AMBULATORY_CARE_PROVIDER_SITE_OTHER): Payer: Self-pay | Admitting: Vascular Surgery

## 2021-08-13 ENCOUNTER — Ambulatory Visit (INDEPENDENT_AMBULATORY_CARE_PROVIDER_SITE_OTHER): Payer: Medicare Other | Admitting: Vascular Surgery

## 2021-08-13 VITALS — BP 128/64 | HR 92 | Ht 71.0 in | Wt 190.0 lb

## 2021-08-13 DIAGNOSIS — M7989 Other specified soft tissue disorders: Secondary | ICD-10-CM

## 2021-08-13 DIAGNOSIS — K2100 Gastro-esophageal reflux disease with esophagitis, without bleeding: Secondary | ICD-10-CM | POA: Diagnosis not present

## 2021-08-13 DIAGNOSIS — C159 Malignant neoplasm of esophagus, unspecified: Secondary | ICD-10-CM

## 2021-08-13 NOTE — Progress Notes (Signed)
MRN : [id number]  Cory [name] is a 66 y.o. ([birth year]) male who presents with chief complaint of check right arm.  History of Present Illness:  Patient is a 66 year old gentleman with a history of stage IV esophageal cancer who is been undergoing treatment for a while.  He now has a compression sleeve which has helped a lot.  He is now seeing Dr Donella Stade for XRT. Although it is not associated with significant pain redness or fever chills it is severe enough that he can no longer close his hand.  The patient returns to the office s/p right arm venogram.  Procedure 07/24/2021: Although there may be some damage to the deep venous system in the upper arm the venography suggests fairly good outflow without central venous obstruction and therefore the degree of edema he has would not be explained by this study.  No outpatient medications have been marked as taking for the 08/13/21 encounter (Appointment) with Delana Meyer, Dolores Lory, MD.    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   Family history of brain cancer    Family history of colon cancer    Family history of melanoma    Family history of stomach cancer    GERD (gastroesophageal reflux disease)    History of kidney stones    Nephrolithiasis    [facility]   Pancreatitis, acute    Personal history of colonic polyps    Dr Nicolasa Ducking   Psoriasis     Past Surgical History:  Procedure Laterality Date   COLONOSCOPY     COLONOSCOPY WITH PROPOFOL N/A 03/31/2020   Procedure: COLONOSCOPY WITH PROPOFOL;  Surgeon: Jonathon Bellows, MD;  Location: [facility] ENDOSCOPY;  Service: Gastroenterology;  Laterality: N/A;   ERCP     ESOPHAGOGASTRODUODENOSCOPY (EGD) WITH PROPOFOL N/A 06/19/2020   Procedure: ESOPHAGOGASTRODUODENOSCOPY (EGD) WITH PROPOFOL;  Surgeon: Jonathon Bellows, MD;  Location: [facility] ENDOSCOPY;  Service: Gastroenterology;  Laterality: N/A;   groin surgery     as a child   PORTA CATH INSERTION N/A 06/28/2020    Procedure: PORTA CATH INSERTION;  Surgeon: Algernon Huxley, MD;  Location: [place] [facility];  Service: Cardiovascular;  Laterality: N/A;   SHOULDER SURGERY     UPPER EXTREMITY VENOGRAPHY Right 07/24/2021   Procedure: UPPER EXTREMITY VENOGRAPHY;  Surgeon: Katha Cabal, MD;  Location: [place] [facility];  Service: Cardiovascular;  Laterality: Right;    Social History Social History   Tobacco Use   Smoking status: Former    Types: Cigarettes    Quit date: 07/08/1988    Years since quitting: 33.1   Smokeless tobacco: Never  Vaping Use   Vaping Use: Never used  Substance Use Topics   Alcohol use: No    Comment: recovering alcoholic for 13 years   Drug use: No    Family History Family History  Problem Relation Age of Onset   Stomach cancer Mother    Diabetes Brother    Hypertension Brother    Hypertension Father    Prostate cancer Father    Colon cancer Sister    Cancer Other        either cervical or ovarian, d. 76s   Brain cancer Sister     No Known Allergies   REVIEW OF SYSTEMS (Negative unless checked)  Constitutional: [] Weight loss  [] Fever  [] Chills Cardiac: [] Chest pain   [] Chest pressure   [] Palpitations   [] Shortness of breath when laying flat   [] Shortness of breath  with exertion. Vascular:  [] Pain in legs with walking   [] Pain in legs at rest  [] History of DVT   [] Phlebitis   [x] Swelling in right arm   [] Varicose veins   [] Non-healing ulcers Pulmonary:   [] Uses home oxygen   [] Productive cough   [] Hemoptysis   [] Wheeze  [] COPD   [] Asthma Neurologic:  [] Dizziness   [] Seizures   [] History of stroke   [] History of TIA  [] Aphasia   [] Vissual changes   [] Weakness or numbness in arm   [] Weakness or numbness in leg Musculoskeletal:   [] Joint swelling   [] Joint pain   [] Low back pain Hematologic:  [] Easy bruising  [] Easy bleeding   [] Hypercoagulable state   [] Anemic Gastrointestinal:  [] Diarrhea   [] Vomiting  [] Gastroesophageal reflux/heartburn   [] Difficulty  swallowing. Genitourinary:  [] Chronic kidney disease   [] Difficult urination  [] Frequent urination   [] Blood in urine Skin:  [] Rashes   [] Ulcers  Psychological:  [] History of anxiety   []  History of major depression.  Physical Examination  There were no vitals filed for this visit. There is no height or weight on file to calculate BMI. Gen: WD/WN, NAD Head: [place]/AT, No temporalis wasting.  Ear/Nose/Throat: Hearing grossly intact, nares w/o erythema or drainage, pinna without lesions Eyes: PER, EOMI, sclera nonicteric.  Neck: Supple, no gross masses.  No JVD.  Pulmonary:  Good air movement, no audible wheezing, no use of accessory muscles.  Cardiac: RRR, precordium not hyperdynamic. Vascular:  3+ soft pitting edema right arm Vessel Right Left  Radial Palpable Palpable  Gastrointestinal: soft, non-distended. No guarding/no peritoneal signs.  Musculoskeletal: M/S 5/5 throughout.  No deformity.  Neurologic: CN 2-12 intact. Pain and light touch intact in extremities.  Symmetrical.  Speech is fluent. Motor exam as listed above. Psychiatric: Judgment intact, Mood & affect appropriate for pt's clinical situation. Dermatologic: Venous rashes no ulcers noted.  No changes consistent with cellulitis. Lymph : No lichenification or skin changes of chronic lymphedema.  CBC Lab Results  Component Value Date   WBC 12.0 (H) 08/06/2021   HGB 12.3 (L) 08/06/2021   HCT 38.3 (L) 08/06/2021   MCV 89.3 08/06/2021   PLT 163 08/06/2021    BMET    Component Value Date/Time   NA 133 (L) 08/06/2021 0753   K 3.8 08/06/2021 0753   CL 101 08/06/2021 0753   CO2 22 08/06/2021 0753   GLUCOSE 220 (H) 08/06/2021 0753   BUN 23 08/06/2021 0753   CREATININE 0.67 08/06/2021 0753   CALCIUM 8.9 08/06/2021 0753   GFRNONAA >60 08/06/2021 0753   Estimated Creatinine Clearance: 96.7 mL/min (by C-G formula based on SCr of 0.67 mg/dL).  COAG No results found for: INR, PROTIME  Radiology MR Brain W Wo  Contrast  Result Date: 07/29/2021 CLINICAL DATA:  66 year old male with metastatic esophageal cancer. Double vision. Brain/dural metastases. EXAM: MRI HEAD WITHOUT AND WITH CONTRAST TECHNIQUE: Multiplanar, multiecho pulse sequences of the brain and surrounding structures were obtained without and with intravenous contrast. CONTRAST:  44mL GADAVIST GADOBUTROL 1 MMOL/ML IV SOLN COMPARISON:  Brain MRI 06/22/2021 and earlier. FINDINGS: Brain: Substantial regression of the large, lobulated dural-based mass centered at the torcula and eccentric to the left. Residual lobulated enhancing mass there now superimposed on some T1 intrinsic hemorrhage in the adjacent occipital poles. Some indistinct margins of the mass but overall now 16 x 21 x 23 mm (AP by transverse by CC) versus 40 x 34 x 30 mm last month. The continued abnormal bilateral pachymeningeal dural  thickening and enhancement, smooth and slightly greater on the left. Associated left side (versus left > right) subdural hematoma, most pronounced along the anterior left frontal convexity, is stable measuring up to 11 mm thickness, with stable mild intracranial mass effect, no significant midline shift. Bilateral occipital lobe vasogenic edema has regressed but not resolved. No new abnormal intracranial enhancement identified. No superimposed restricted diffusion to suggest acute infarction, ventriculomegaly, or new intracranial hemorrhage. Cervicomedullary junction and pituitary are within normal limits. Vascular: Major intracranial vascular flow voids appear stable since last month. 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 Skull and upper cervical spine: Mildly expansile heterogeneously enhancing metastasis of the bony clivus (series 9, image 10) has progressed from both prior studies, now up to 2 cm long axis, but is not associated with significant basilar dural thickening at this time. Note evidence of hypercellularity on diffusion  series 5, image 11. No other destructive bone lesion identified. Negative visible upper cervical spinal cord. Sinuses/Orbits: Abnormal right globe with superior T1 bright, T2 dark vitreous mass (series 18, image 25) is stable. No other intraorbital mass or inflammation. Paranasal Visualized paranasal sinuses and mastoids are stable and well aerated. Other: Visible internal auditory structures appear normal. IMPRESSION: 1. Substantial regression of lobulated dural-based torcula mass since last month. Residual is 1.6 x 2.1 x 2.3 cm. Associated chronic hemorrhage in the occipital poles but regressed occipital vasogenic edema. 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 2. Stable suspicious pachymeningeal thickening left > right. Stable left side subdural hematoma up to 11 mm thickness. No midline shift or significant intracranial mass effect at this time. No new brain or dural metastasis identified. 3. Progressed bone metastasis destroying the clivus, approximately 2 cm. 4. Stable abnormal right globe, vitreous metastasis versus hemorrhage. Electronically Signed   By: Genevie Ann M.D.   On: 07/29/2021 09:46   CT ABDOMEN PELVIS W CONTRAST  Result Date: 07/19/2021 CLINICAL DATA:  Esophageal cancer with liver metastasis. Long chemotherapy. Right upper extremity swelling. Evaluate for vascular occlusion. EXAM: CTV  CHEST, CT ABDOMEN, AND PELVIS WITH CONTRAST TECHNIQUE: Multidetector CT imaging of the chest, abdomen and pelvis was performed following the standard protocol during bolus administration of intravenous contrast. Chest imaging was timed to optimally visualize the venous system. RADIATION DOSE REDUCTION: This exam was performed according to the departmental dose-optimization program which includes automated exposure control, adjustment of the mA and/or kV according to patient size and/or use of iterative reconstruction technique. CONTRAST:  114mL OMNIPAQUE IOHEXOL 350 MG/ML SOLN COMPARISON:   05/22/2021 PET.  Diagnostic CTs 04/16/2021. FINDINGS: CT CHEST FINDINGS Cardiovascular: Right IJ Port-A-Cath tip at superior caval/atrial junction. Aortic atherosclerosis. Patent SVC and brachiocephalic veins. Patent axillary veins. Normal heart size, without pericardial effusion. No central pulmonary embolism, on this non-dedicated study. Mediastinum/Nodes: No supraclavicular adenopathy. 1.1 cm right axillary node on 25/2 is significantly enlarged compared to the prior exam. A more anterior right axillary 1.0 cm node on 20/2 is new or significantly enlarged since the prior. No mediastinal adenopathy. Right hilar node of 1.2 cm is new on 37/2. Left hilar developing adenopathy at 1.6 cm on 29/2. No dominant esophageal mass. Lungs/Pleura: Trace right greater than left pleural effusions are not significantly changed. Mild centrilobular emphysema. New and enlarging pulmonary nodules. A nodule along the right minor fissure measures 2.4 x 1.6 cm on 98/4 and is new since the prior diagnostic CT. Right lower lobe pleural-based nodules, including at 2.1 cm on 117/4, new. The left  lower lobe pulmonary nodule described on 04/16/2021 CT has likely resolved. There are however, new left lower lobe pulmonary nodules including a 9 mm nodule on 95/4. Musculoskeletal: Left chest wall 9 mm subcutaneous nodule on 57/2 is increased from 7 mm on the prior. A skin lesion in the anterior right chest wall of 1.5 cm on 48/2 is unchanged and favored to represent a sebaceous cyst. Anterior right fifth rib is subtly expansile with overlying soft tissue thickening including on 44/2, new. Incompletely imaged soft tissue fullness surrounding the right shoulder including on 15/2. This is at the site of a comminuted proximal humerus fracture on the prior CT. This area is more completely imaged on coronal image 37. Also sagittal image 1. CT ABDOMEN PELVIS FINDINGS Hepatobiliary: Cirrhosis. High right hepatic lobe hypoattenuating 2.5 cm lesion on 27/2  is at the site of a subcentimeter lesion on the prior. Segment 4A 1.5 cm lesion on 29/2 is either new or significantly increased since the prior. Normal gallbladder, without biliary ductal dilatation. Pancreas: Normal, without mass or ductal dilatation. Spleen: Mild splenomegaly at 14.7 cm craniocaudal. Adrenals/Urinary Tract: Normal adrenal glands. Punctate lower pole right renal collecting system calculus. Normal left kidney. No hydronephrosis. Normal urinary bladder. Stomach/Bowel: Normal stomach, without wall thickening. Normal colon, appendix, and terminal ileum. Normal small bowel. Vascular/Lymphatic: Aortic atherosclerosis. Patent portal and splenic veins. No abdominopelvic adenopathy. Reproductive: Mild prostatomegaly. Other: No significant free fluid. Small fat containing left inguinal hernia. No free intraperitoneal air. Musculoskeletal: No acute osseous abnormality. Schmorl's node deformity at L3 and L5. Convex left lumbar spine curvature. IMPRESSION: CT CHEST IMPRESSION 1. Significant progression of pulmonary metastasis. 2. Development of thoracic adenopathy, highly suspicious for nodal metastasis. 3. No evidence of IVC or central venous thrombosis. 4. Incompletely imaged, significant soft tissue fullness surrounding the right shoulder in the setting of prior comminuted fracture. Consider dedicated imaging (contrast enhanced CT or MRI) and clinical correlation to exclude pathologic fracture with soft tissue component. 5. Right axillary developing adenopathy which could be reactive or metastatic given the appearance of the incompletely imaged right shoulder. 6. Trace bilateral pleural effusions, similar. 7. Aortic atherosclerosis (ICD10-I70.0) and emphysema (ICD10-J43.9). 8. Nonspecific enlarging anterior left chest wall nodule for which metastatic disease is possible. Recommend attention on follow-up. 9. Suspect developing osseous metastasis within the anterior right fifth rib. CT ABDOMEN AND PELVIS  IMPRESSION 1. Progressive hepatic metastasis. 2. Cirrhosis and mild splenomegaly. 3. Right nephrolithiasis. Electronically Signed   By: Abigail Miyamoto M.D.   On: 07/19/2021 16:58   PERIPHERAL VASCULAR CATHETERIZATION  Result Date: 07/24/2021 See surgical note for result.  US Venous Img Upper Uni Right(DVT)  Result Date: 07/17/2021 CLINICAL DATA:  Right arm swelling, pain EXAM: RIGHT UPPER EXTREMITY VENOUS DOPPLER ULTRASOUND TECHNIQUE: Gray-scale sonography with graded compression, as well as color Doppler and duplex ultrasound were performed to evaluate the upper extremity deep venous system from the level of the subclavian vein and including the jugular, axillary, basilic, radial, ulnar and upper cephalic vein. Spectral Doppler was utilized to evaluate flow at rest and with distal augmentation maneuvers. COMPARISON:  None. FINDINGS: Contralateral Subclavian Vein: Respiratory phasicity is normal and symmetric with the symptomatic side. No evidence of thrombus. Normal compressibility. Internal Jugular Vein: No evidence of thrombus. Normal compressibility, respiratory phasicity and response to augmentation. Subclavian Vein: No evidence of thrombus. Normal compressibility, respiratory phasicity and response to augmentation. Axillary Vein: No evidence of thrombus. Normal compressibility, respiratory phasicity and response to augmentation. Cephalic Vein: No evidence of thrombus. Normal  compressibility, respiratory phasicity and response to augmentation. Basilic Vein: No evidence of thrombus. Normal compressibility, respiratory phasicity and response to augmentation. Brachial Veins: No evidence of thrombus. Normal compressibility, respiratory phasicity and response to augmentation. Radial Veins: No evidence of thrombus. Normal compressibility, respiratory phasicity and response to augmentation. Ulnar Veins: No evidence of thrombus. Normal compressibility, respiratory phasicity and response to augmentation.  IMPRESSION: No evidence of DVT within the right upper extremity. Electronically Signed   By: Jerilynn Mages.  Shick M.D.   On: 07/17/2021 16:20   ECHOCARDIOGRAM COMPLETE  Result Date: 07/25/2021    ECHOCARDIOGRAM REPORT   Patient Name:   DAWSYN RAMSARAN Date of Exam: 07/25/2021 Medical Rec #:  [id number]      Height:       71.0 in Accession #:    [id number]     Weight:       202.6 lb Date of Birth:  08/31/1955     BSA:          2.120 m Patient Age:    66 years       BP:           142/78 mmHg Patient Gender: M              HR:           80 bpm. Exam Location:  [facility] Procedure: 2D Echo, Cardiac Doppler, Color Doppler and Strain Analysis Indications:     Z51.11 Encounter for antineoplastic chemotherapy  History:         Patient has prior history of Echocardiogram examinations, most                  recent 05/21/2021. Cancer.  Sonographer:     Sherrie Sport Referring Phys:  [id number] [facility] C RAO Diagnosing Phys: Kate Sable MD  Sonographer Comments: Global longitudinal strain was attempted. IMPRESSIONS  1. There is a 17 x 16 mm mass in the LV apex. Consider CMR for better characterization of LV apical mass.. Left ventricular ejection fraction, by estimation, is 50 to 55%. Left ventricular ejection fraction by 2D MOD biplane is 51.7 %. The left ventricle has low normal function. The left ventricle has no regional wall motion abnormalities. Left ventricular diastolic parameters were normal.  2. Right ventricular systolic function is normal. The right ventricular size is normal.  3. Right atrial size was mildly dilated.  4. The mitral valve is normal in structure. Mild mitral valve regurgitation.  5. The aortic valve was not well visualized. Aortic valve regurgitation is not visualized.  6. The inferior vena cava is normal in size with greater than 50% respiratory variability, suggesting right atrial pressure of 3 mmHg. FINDINGS  Left Ventricle: There is a 17 x 16 mm mass in the LV apex. Consider CMR for better characterization of  LV apical mass. Left ventricular ejection fraction, by estimation, is 50 to 55%. Left ventricular ejection fraction by 2D MOD biplane is 51.7 %. The left ventricle has low normal function. The left ventricle has no regional wall motion abnormalities. Global longitudinal strain performed but not reported based on interpreter judgement due to suboptimal tracking. 3D left ventricular ejection fraction analysis performed but not reported based on interpreter judgement due to suboptimal tracking. The left ventricular internal cavity size was normal in size. There is no left ventricular hypertrophy. Left ventricular diastolic parameters were normal. Right Ventricle: The right ventricular size is normal. No increase in right ventricular wall thickness. Right ventricular systolic function is normal. Left Atrium: Left  atrial size was normal in size. Right Atrium: Right atrial size was mildly dilated. Pericardium: There is no evidence of pericardial effusion. Mitral Valve: The mitral valve is normal in structure. Mild mitral valve regurgitation. MV peak gradient, 8.4 mmHg. The mean mitral valve gradient is 4.0 mmHg. Tricuspid Valve: The tricuspid valve is grossly normal. Tricuspid valve regurgitation is not demonstrated. Aortic Valve: The aortic valve was not well visualized. Aortic valve regurgitation is not visualized. Pulmonic Valve: The pulmonic valve was not well visualized. Pulmonic valve regurgitation is not visualized. Aorta: The aortic root is normal in size and structure. Venous: The inferior vena cava is normal in size with greater than 50% respiratory variability, suggesting right atrial pressure of 3 mmHg. IAS/Shunts: No atrial level shunt detected by color flow Doppler.  LEFT VENTRICLE PLAX 2D                        Biplane EF (MOD) LVIDd:         5.45 cm         LV Biplane EF:   Left LVIDs:         4.10 cm                          ventricular LV PW:         1.20 cm                          ejection LV IVS:         1.00 cm                          fraction by LVOT diam:     2.20 cm                          2D MOD LV SV:         56                               biplane is LV SV Index:   27                               51.7 %. LVOT Area:     3.80 cm                                Diastology                                LV e' medial:    10.30 cm/s LV Volumes (MOD)               LV E/e' medial:  10.3 LV vol d, MOD    139.0 ml      LV e' lateral:   7.94 cm/s A2C:                           LV E/e' lateral: 13.4 LV vol d, MOD    157.0 ml A4C: LV vol s, MOD    67.9 ml A2C: LV vol s, MOD  75.9 ml       3D Volume EF: A4C:                           3D EF:        53 % LV SV MOD A2C:   71.1 ml       LV EDV:       229 ml LV SV MOD A4C:   157.0 ml      LV ESV:       108 ml LV SV MOD BP:    76.8 ml       LV SV:        120 ml RIGHT VENTRICLE TAPSE (M-mode): 4.0 cm LEFT ATRIUM             Index        RIGHT ATRIUM           Index LA diam:        3.20 cm 1.51 cm/m   RA Area:     23.30 cm LA Vol (A2C):   79.4 ml 37.45 ml/m  RA Volume:   73.10 ml  34.48 ml/m LA Vol (A4C):   27.4 ml 12.92 ml/m LA Biplane Vol: 50.9 ml 24.01 ml/m  AORTIC VALVE             PULMONIC VALVE LVOT Vmax:   68.50 cm/s  PV Vmax:        0.84 m/s LVOT Vmean:  42.400 cm/s PV Vmean:       59.000 cm/s LVOT VTI:    0.148 m     PV VTI:         0.153 m                          PV Peak grad:   2.8 mmHg AORTA                    PV Mean grad:   2.0 mmHg Ao Root diam: 3.43 cm    RVOT Peak grad: 4 mmHg  MITRAL VALVE MV Area (PHT): 3.23 cm     SHUNTS MV Area VTI:   2.07 cm     Systemic VTI:  0.15 m MV Peak grad:  8.4 mmHg     Systemic Diam: 2.20 cm MV Mean grad:  4.0 mmHg     Pulmonic VTI:  0.170 m MV Vmax:       1.45 m/s MV Vmean:      98.4 cm/s MV Decel Time: 235 msec MV E velocity: 106.00 cm/s MV A velocity: 71.60 cm/s MV E/A ratio:  1.48 Kate Sable MD Electronically signed by Kate Sable MD Signature Date/Time: 07/25/2021/1:53:34 PM    Final    CT VENOGRAM  CHEST  Result Date: 07/19/2021 CLINICAL DATA:  Esophageal cancer with liver metastasis. Long chemotherapy. Right upper extremity swelling. Evaluate for vascular occlusion. EXAM: CTV  CHEST, CT ABDOMEN, AND PELVIS WITH CONTRAST TECHNIQUE: Multidetector CT imaging of the chest, abdomen and pelvis was performed following the standard protocol during bolus administration of intravenous contrast. Chest imaging was timed to optimally visualize the venous system. RADIATION DOSE REDUCTION: This exam was performed according to the departmental dose-optimization program which includes automated exposure control, adjustment of the mA and/or kV according to patient size and/or use of iterative reconstruction technique. CONTRAST:  129mL OMNIPAQUE IOHEXOL 350 MG/ML SOLN COMPARISON:  05/22/2021 PET.  Diagnostic CTs  04/16/2021. FINDINGS: CT CHEST FINDINGS Cardiovascular: Right IJ Port-A-Cath tip at superior caval/atrial junction. Aortic atherosclerosis. Patent SVC and brachiocephalic veins. Patent axillary veins. Normal heart size, without pericardial effusion. No central pulmonary embolism, on this non-dedicated study. Mediastinum/Nodes: No supraclavicular adenopathy. 1.1 cm right axillary node on 25/2 is significantly enlarged compared to the prior exam. A more anterior right axillary 1.0 cm node on 20/2 is new or significantly enlarged since the prior. No mediastinal adenopathy. Right hilar node of 1.2 cm is new on 37/2. Left hilar developing adenopathy at 1.6 cm on 29/2. No dominant esophageal mass. Lungs/Pleura: Trace right greater than left pleural effusions are not significantly changed. Mild centrilobular emphysema. New and enlarging pulmonary nodules. A nodule along the right minor fissure measures 2.4 x 1.6 cm on 98/4 and is new since the prior diagnostic CT. Right lower lobe pleural-based nodules, including at 2.1 cm on 117/4, new. The left lower lobe pulmonary nodule described on 04/16/2021 CT has likely resolved.  There are however, new left lower lobe pulmonary nodules including a 9 mm nodule on 95/4. Musculoskeletal: Left chest wall 9 mm subcutaneous nodule on 57/2 is increased from 7 mm on the prior. A skin lesion in the anterior right chest wall of 1.5 cm on 48/2 is unchanged and favored to represent a sebaceous cyst. Anterior right fifth rib is subtly expansile with overlying soft tissue thickening including on 44/2, new. Incompletely imaged soft tissue fullness surrounding the right shoulder including on 15/2. This is at the site of a comminuted proximal humerus fracture on the prior CT. This area is more completely imaged on coronal image 37. Also sagittal image 1. CT ABDOMEN PELVIS FINDINGS Hepatobiliary: Cirrhosis. High right hepatic lobe hypoattenuating 2.5 cm lesion on 27/2 is at the site of a subcentimeter lesion on the prior. Segment 4A 1.5 cm lesion on 29/2 is either new or significantly increased since the prior. Normal gallbladder, without biliary ductal dilatation. Pancreas: Normal, without mass or ductal dilatation. Spleen: Mild splenomegaly at 14.7 cm craniocaudal. Adrenals/Urinary Tract: Normal adrenal glands. Punctate lower pole right renal collecting system calculus. Normal left kidney. No hydronephrosis. Normal urinary bladder. Stomach/Bowel: Normal stomach, without wall thickening. Normal colon, appendix, and terminal ileum. Normal small bowel. Vascular/Lymphatic: Aortic atherosclerosis. Patent portal and splenic veins. No abdominopelvic adenopathy. Reproductive: Mild prostatomegaly. Other: No significant free fluid. Small fat containing left inguinal hernia. No free intraperitoneal air. Musculoskeletal: No acute osseous abnormality. Schmorl's node deformity at L3 and L5. Convex left lumbar spine curvature. IMPRESSION: CT CHEST IMPRESSION 1. Significant progression of pulmonary metastasis. 2. Development of thoracic adenopathy, highly suspicious for nodal metastasis. 3. No evidence of IVC or central  venous thrombosis. 4. Incompletely imaged, significant soft tissue fullness surrounding the right shoulder in the setting of prior comminuted fracture. Consider dedicated imaging (contrast enhanced CT or MRI) and clinical correlation to exclude pathologic fracture with soft tissue component. 5. Right axillary developing adenopathy which could be reactive or metastatic given the appearance of the incompletely imaged right shoulder. 6. Trace bilateral pleural effusions, similar. 7. Aortic atherosclerosis (ICD10-I70.0) and emphysema (ICD10-J43.9). 8. Nonspecific enlarging anterior left chest wall nodule for which metastatic disease is possible. Recommend attention on follow-up. 9. Suspect developing osseous metastasis within the anterior right fifth rib. CT ABDOMEN AND PELVIS IMPRESSION 1. Progressive hepatic metastasis. 2. Cirrhosis and mild splenomegaly. 3. Right nephrolithiasis. Electronically Signed   By: Abigail Miyamoto M.D.   On: 07/19/2021 16:58     Assessment/Plan 1. Swelling of arm It is improved  and he now has a compression sleeve which is being refitted.  He is also now seeing Dr. Donella Stade for beginning radiation therapy.  I have described a lymphedema pump to him and shown him a picture of it from the Internet.  I will see him back in 3 months and we can readdress this issue depending on the benefit of his compression sleeve in conjunction with radiation therapy.  2. 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 Gastroesophageal reflux disease with esophagitis without hemorrhage Continue PPI as already ordered, this medication has been reviewed and there are no changes at this time.  Avoidence of caffeine and alcohol  Moderate elevation of the head of the bed       [name], MD  08/13/2021 8:33 AM

## 2021-08-14 ENCOUNTER — Ambulatory Visit
Admission: RE | Admit: 2021-08-14 | Discharge: 2021-08-14 | Disposition: A | Discharge status: Home / Self Care | Payer: Medicare Other | Source: Ambulatory Visit | Attending: Radiation Oncology | Admitting: Radiation Oncology

## 2021-08-14 DIAGNOSIS — C787 Secondary malignant neoplasm of liver and intrahepatic bile duct: Secondary | ICD-10-CM | POA: Diagnosis not present

## 2021-08-14 DIAGNOSIS — Z5111 Encounter for antineoplastic chemotherapy: Secondary | ICD-10-CM | POA: Diagnosis not present

## 2021-08-14 DIAGNOSIS — Z87891 Personal history of nicotine dependence: Secondary | ICD-10-CM | POA: Diagnosis not present

## 2021-08-14 DIAGNOSIS — C160 Malignant neoplasm of cardia: Secondary | ICD-10-CM | POA: Insufficient documentation

## 2021-08-14 DIAGNOSIS — Z510 Encounter for antineoplastic radiation therapy: Secondary | ICD-10-CM | POA: Diagnosis not present

## 2021-08-14 DIAGNOSIS — I890 Lymphedema, not elsewhere classified: Secondary | ICD-10-CM | POA: Diagnosis present

## 2021-08-14 DIAGNOSIS — C155 Malignant neoplasm of lower third of esophagus: Secondary | ICD-10-CM | POA: Diagnosis present

## 2021-08-14 DIAGNOSIS — C7931 Secondary malignant neoplasm of brain: Secondary | ICD-10-CM | POA: Insufficient documentation

## 2021-08-15 ENCOUNTER — Other Ambulatory Visit: Payer: Self-pay

## 2021-08-15 ENCOUNTER — Inpatient Hospital Stay: Payer: Medicare Other | Attending: Oncology | Admitting: Occupational Therapy

## 2021-08-15 DIAGNOSIS — I890 Lymphedema, not elsewhere classified: Secondary | ICD-10-CM

## 2021-08-15 NOTE — Therapy (Signed)
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[place]) 06/23/2020   Family history of brain cancer    Family history of colon cancer    Family history of melanoma    Family history of stomach cancer    GERD (gastroesophageal reflux disease)    History of kidney stones    Nephrolithiasis    [facility]   Pancreatitis, acute    Personal history of colonic polyps    Dr Nicolasa Ducking   Psoriasis     Past Surgical History:  Procedure Laterality Date   COLONOSCOPY     COLONOSCOPY WITH PROPOFOL N/A 03/31/2020   Procedure: COLONOSCOPY WITH PROPOFOL;  Surgeon: Jonathon Bellows, MD;  Location: [facility] ENDOSCOPY;  Service: Gastroenterology;  Laterality: N/A;   ERCP     ESOPHAGOGASTRODUODENOSCOPY (EGD) WITH PROPOFOL N/A 06/19/2020   Procedure: ESOPHAGOGASTRODUODENOSCOPY (EGD) WITH PROPOFOL;  Surgeon: Jonathon Bellows, MD;  Location: [facility] ENDOSCOPY;  Service: Gastroenterology;  Laterality: N/A;   groin surgery     as a child   PORTA CATH INSERTION N/A 06/28/2020   Procedure: PORTA CATH INSERTION;  Surgeon: Algernon Huxley, MD;  Location: [place] CV LAB;  Service: Cardiovascular;  Laterality: N/A;   SHOULDER SURGERY     UPPER EXTREMITY VENOGRAPHY Right 07/24/2021   Procedure: UPPER EXTREMITY VENOGRAPHY;  Surgeon: Katha Cabal, MD;  Location: [place] CV LAB;  Service: Cardiovascular;  Laterality: Right;    There were no vitals filed for this visit.   Subjective Assessment - 08/15/21 1227     Subjective  I kept my bandages off at night time - going to start radiation Tues- my arm is doing well if  I wear compressoin but it I keep it off it increased    Currently in Pain? No/denies                 LYMPHEDEMA/ONCOLOGY QUESTIONNAIRE - 08/15/21 0001       Right Upper Extremity Lymphedema   15 cm Proximal to Olecranon Process 37 cm    10 cm Proximal to Olecranon Process 36.5 cm    Olecranon Process 36 cm    15 cm Proximal to Ulnar Styloid Process 34 cm    10 cm Proximal to Ulnar Styloid Process 28 cm    Just Proximal to Ulnar Styloid Process 23 cm    Across Hand at [employer] 26 cm    At [place] of 2nd Digit 7.4 cm    At [facility] of Thumb 7.5 cm               Assessment and plan DR RAO on 08/06/21 - Patient is a 66 y.o. male with metastatic GE junction adenocarcinoma here for on treatment assessment prior to cycle 1 of second line Enhertu treatment   Counts okay to proceed with Enhertu treatment today which will be given with a palliative intent.  Discussed risks and benefits of Enhertu including all but not limited to possible risk of pneumonitis and cardiotoxicity.  We have sent NGS testing on his peripheral blood  to see if he is continuing to remain HER2 positive or not.  If these tests show that he is HER2 negative I will stop Enhertu treatment for cycle 2 and switch him to palliative chemotherapy likely FOLFIRI.  Given that he has significant disease burden and rapid progression of disease and proceeding with Enhertu treatment today.  Treatment will be given with a palliative intent.  Patient and his daughter verbalized understanding.  Echocardiogram was normal.  He was seen by Dr. Fletcher Anon from cardiology as well.  There was a small LV mass noted which does not appear to be a thrombus and could be calcified trabeculation.  Plan is to monitor this when we repeat his echocardiogram again in 3 months time.   Neoplasm related pain: Continue fentanyl patch and as needed oxycodone   Right upper extremity swelling: He has not been deemed to be an operative candidate by [facility]  orthopedic oncology.  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  If he progresses on second line chemotherapy/Enhertu I would recommend hospice at that time   (Patient noted to have significant swelling in his right upper extremity.  CT venogram did not show any vascular stenosis and patient was seen by vascular surgery.  Swelling has been attributed secondary to his fracture that he has had in the past following a fall which has not been operated upon and was allowed to heal by secondary intent)   Patient seen at [facility] Dr. Oralia Rud for second opinion.  They agreed upon Enhertu for second line treatment if he continues to remain HER2 positive on repeat testing   Interval history-patient is unable to see from his right eye at this point.  He does not drive anymore.  Continues to have right upper extremity swelling.  Pain is relatively well controlled with fentanyl patch and as needed oxycodone.         OT SCREEN 08/08/21:   Circumference taken for bilateral UE - R dominant UE increase by 6.5 cm upper arm, elbow 5 cm , forearm 6 cm , wrist 4 cm and hand 3 cm compare to L.  Fibrosis of upper arm and hard - decrease shoulder AROM , elbow flexion end range limited by swelling but sup/pro, wrist and hand WFL Would not recommend at this time for pt to try and donn 20-69mHG over the counter compression sleeve -do not have the strength and will reinjured UE -and  compression sleeve do not decrease but maintain. Done Isotoner glove with pt and Tubigrip  D hand to elbow and tubigrip F from wrist to upper arm -about 952mg and had trouble to donn that - daughter is at home with him until Friday to assist. Did recommend for them to do one layer bandage for day or 2 - with isotoner glove, soft stockinet and 10 cm short stretch hand and wrist 2 x  wrap and over lap 50% to upper arm. And then try again tubigip D and F with isotoner glove - wear light compression most  of the time And ed on soft MLD on upper  arm over top of shoulder -and daughter ed on light fibrosis tech on upper arm prior to MLD   Return in week for follow up    OT SCREEN 08/15/21:   Pt arrive with soft stockinett- isotoner glove and one layer 10cm short stretch to mid upper arm. He report keeping bandages off since seen vascular MD at night time - but feels like swelling is less with compression but increase if not wearing compression Hard time doing bandage himself. Starting radiation to humerus and axillary ln on R Tuesday. Circumference taken for  L UE - increase from hand to upper arm   Fibrosis of upper arm and hard - decrease shoulder AROM , elbow flexion end range limited by swelling but sup/pro, wrist and hand WFL Would not recommend at this time for pt to try and donn 20-77mHG over the counter compression sleeve -do not have the strength and will reinjured UE -and  compression sleeve do not decrease but maintain. Recommend 3 layer bandage at this time - OT will do today and son will redo it Fri and Sunday and Tuesday - OT will check on him Wednesday Done Eucerin lotion, Isotoner glove  and soft stockinet Artiflex 10 cm wrist, hand , wrist to forearm , 15 cm Artiflex wrist, hand and then back up to axilla  8 cm short stretch wrist , hand , wrist to mid forearm , 10 cm wrist , hand , wrist and up circular 50% over lay to axilla  Reinforce many times with pt and son -if issues and increase pressure, swelling or pain at upper arm or shoulder -to remove and just do light one  layer bandage  with isotoner glove, soft stockinet  Review again and can cont to do light  MLD on upper  arm over top of shoulder   Return in week for follow up                             Patient will benefit from skilled therapeutic intervention in order to improve the following deficits and impairments:           Visit Diagnosis: Lymphedema, not elsewhere classified    Problem List Patient Active Problem List   Diagnosis Date Noted   Lymphedema 07/20/2021   Swelling of arm 07/17/2021   Great toe pain, left 02/06/2021   Advance directive discussed with patient 02/06/2021   Genetic testing 09/07/2020   Family history of colon cancer    Family history of brain cancer    Family history of stomach cancer    Family history of melanoma    Goals of care, counseling/discussion 06/23/2020   [place]ophageal [place] ([place] 06/23/2020   Esophageal dysphagia 05/26/2020   Erectile dysfunction 09/20/2013   Routine general medical examination at a health care facility 09/13/2011   Psoriasis 09/13/2011   ALLERGIC RHINITIS 04/28/2009   GERD 04/28/2009   COLONIC POLYPS, HX OF 04/28/2009    DRosalyn Gess OTR/L,CLT 08/15/2021, 12:29 PM   [facility] [place] [address]Phone: [phone]  Fax:  [phone] Name: Cory CHATTERJEEMRN: [id number] of Birth: [birth year]

## 2021-08-16 ENCOUNTER — Ambulatory Visit: Payer: Medicare Other

## 2021-08-17 ENCOUNTER — Ambulatory Visit: Payer: Medicare Other

## 2021-08-17 DIAGNOSIS — Z5111 Encounter for antineoplastic chemotherapy: Secondary | ICD-10-CM | POA: Diagnosis not present

## 2021-08-20 ENCOUNTER — Ambulatory Visit: Payer: Medicare Other

## 2021-08-21 ENCOUNTER — Ambulatory Visit
Admission: RE | Admit: 2021-08-21 | Discharge: 2021-08-21 | Disposition: A | Discharge status: Home / Self Care | Payer: Medicare Other | Source: Ambulatory Visit | Attending: Radiation Oncology | Admitting: Radiation Oncology

## 2021-08-21 ENCOUNTER — Ambulatory Visit: Payer: Medicare Other

## 2021-08-21 DIAGNOSIS — Z5111 Encounter for antineoplastic chemotherapy: Secondary | ICD-10-CM | POA: Diagnosis not present

## 2021-08-22 ENCOUNTER — Other Ambulatory Visit: Payer: Self-pay

## 2021-08-22 ENCOUNTER — Ambulatory Visit
Admission: RE | Admit: 2021-08-22 | Discharge: 2021-08-22 | Disposition: A | Discharge status: Home / Self Care | Payer: Medicare Other | Source: Ambulatory Visit | Attending: Radiation Oncology | Admitting: Radiation Oncology

## 2021-08-22 ENCOUNTER — Inpatient Hospital Stay: Payer: Medicare Other | Admitting: Occupational Therapy

## 2021-08-22 DIAGNOSIS — I890 Lymphedema, not elsewhere classified: Secondary | ICD-10-CM

## 2021-08-22 DIAGNOSIS — Z5111 Encounter for antineoplastic chemotherapy: Secondary | ICD-10-CM | POA: Diagnosis not present

## 2021-08-22 NOTE — Therapy (Signed)
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[place]) 06/23/2020   Family history of brain cancer    Family history of colon cancer    Family history of melanoma    Family history of stomach cancer    GERD (gastroesophageal reflux disease)    History of kidney stones    Nephrolithiasis    [facility]   Pancreatitis, acute    Personal history of colonic polyps    Dr Nicolasa Ducking   Psoriasis     Past Surgical History:  Procedure Laterality Date   COLONOSCOPY     COLONOSCOPY WITH PROPOFOL N/A 03/31/2020   Procedure: COLONOSCOPY WITH PROPOFOL;  Surgeon: Jonathon Bellows, MD;  Location: [facility] ENDOSCOPY;  Service: Gastroenterology;  Laterality: N/A;   ERCP     ESOPHAGOGASTRODUODENOSCOPY (EGD) WITH PROPOFOL N/A 06/19/2020   Procedure: ESOPHAGOGASTRODUODENOSCOPY (EGD) WITH PROPOFOL;  Surgeon: Jonathon Bellows, MD;  Location: [facility] ENDOSCOPY;  Service: Gastroenterology;  Laterality: N/A;   groin surgery     as a child   PORTA CATH INSERTION N/A 06/28/2020   Procedure: PORTA CATH INSERTION;  Surgeon: Algernon Huxley, MD;  Location: [place] CV LAB;  Service: Cardiovascular;  Laterality: N/A;   SHOULDER SURGERY     UPPER EXTREMITY VENOGRAPHY Right 07/24/2021   Procedure: UPPER EXTREMITY VENOGRAPHY;  Surgeon: Katha Cabal, MD;  Location: [place] CV LAB;  Service: Cardiovascular;  Laterality: Right;    There were no vitals filed for this visit.   Subjective Assessment - 08/22/21 1322     Subjective  I had 2 sessions of radiation - and doing okay - banaging help - my arm is smaller and my son  changed it like to talked about but we forgot to record last time - so I need review again    Currently in Pain? No/denies                 LYMPHEDEMA/ONCOLOGY QUESTIONNAIRE - 08/22/21 0001       Right Upper Extremity Lymphedema   15 cm Proximal to Olecranon Process 35 cm    10 cm Proximal to Olecranon Process 34.5 cm    Olecranon Process 35.5 cm    15 cm Proximal to Ulnar Styloid Process 34 cm    10 cm Proximal to Ulnar Styloid Process 28.5 cm    Just Proximal to Ulnar Styloid Process 22.5 cm    Across Hand at [employer] 25 cm    At [place] of 2nd Digit 7.2 cm    At [facility] of Thumb 7.5 cm               Assessment and plan DR RAO on 08/06/21 - Patient is a 66 y.o. male with metastatic GE junction adenocarcinoma here for on treatment assessment prior to cycle 1 of second line Enhertu treatment   Counts okay to proceed with Enhertu treatment today which will be given with a palliative intent.  Discussed risks and benefits of Enhertu including all but not limited to possible risk of pneumonitis and cardiotoxicity.  We have sent NGS testing on his peripheral blood to see if he is continuing to remain HER2 positive or not.  If these tests show that he is HER2 negative I will stop Enhertu treatment for cycle 2 and switch him to palliative chemotherapy likely FOLFIRI.  Given that he has significant disease burden and rapid progression of disease and proceeding with Enhertu treatment today.  Treatment will be given with a palliative intent.  Patient and his daughter verbalized understanding.  Echocardiogram was normal.  He was seen by Dr. Fletcher Anon from cardiology as well.  There was a small LV mass noted which does not appear to be a thrombus and could be calcified trabeculation.  Plan is to monitor this when we repeat his echocardiogram again in 3 months time.   Neoplasm related pain: Continue fentanyl patch and as needed oxycodone   Right upper extremity swelling: He has not been deemed  to be an operative candidate by [facility] orthopedic oncology.  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  If he progresses on second line chemotherapy/Enhertu I would recommend hospice at that time   (Patient noted to have significant swelling in his right upper extremity.  CT venogram did not show any vascular stenosis and patient was seen by vascular surgery.  Swelling has been attributed secondary to his fracture that he has had in the past following a fall which has not been operated upon and was allowed to heal by secondary intent)   Patient seen at [facility] Dr. Oralia Rud for second opinion.  They agreed upon Enhertu for second line treatment if he continues to remain HER2 positive on repeat testing   Interval history-patient is unable to see from his right eye at this point.  He does not drive anymore.  Continues to have right upper extremity swelling.  Pain is relatively well controlled with fentanyl patch and as needed oxycodone.         OT SCREEN 08/08/21:   Circumference taken for bilateral UE - R dominant UE increase by 6.5 cm upper arm, elbow 5 cm , forearm 6 cm , wrist 4 cm and hand 3 cm compare to L.  Fibrosis of upper arm and hard - decrease shoulder AROM , elbow flexion end range limited by swelling but sup/pro, wrist and hand WFL Would not recommend at this time for pt to try and donn 20-16mHG over the counter compression sleeve -do not have the strength and will reinjured UE -and  compression sleeve do not decrease but maintain. Done  Isotoner glove with pt and Tubigrip D hand to elbow and tubigrip F from wrist to upper arm -about 953mg and had trouble to donn that - daughter is at home with him until Friday to assist. Did recommend for them to do one layer bandage for day or 2 - with isotoner glove, soft stockinet and 10 cm short stretch hand and wrist 2 x  wrap and over lap 50% to upper arm. And then try again tubigip D and  F with isotoner glove - wear light compression most of the time And ed on soft MLD on upper  arm over top of shoulder -and daughter ed on light fibrosis tech on upper arm prior to MLD   Return in week for follow up     OT SCREEN 08/15/21:   Pt arrive with soft stockinett- isotoner glove and one layer 10cm short stretch to mid upper arm. He report keeping bandages off since seen vascular MD at night time - but feels like swelling is less with compression but increase if not wearing compression Hard time doing bandage himself. Starting radiation to humerus and axillary ln on R Tuesday. Circumference taken for  L UE - increase from hand to upper arm   Fibrosis of upper arm and hard - decrease shoulder AROM , elbow flexion end range limited by swelling but sup/pro, wrist and hand WFL Would not recommend at this time for pt to try and donn 20-46mHG over the counter compression sleeve -do not have the strength and will reinjured UE -and  compression sleeve do not decrease but maintain. Recommend 3 layer bandage at this time - OT will do today and son will redo it Fri and Sunday and Tuesday - OT will check on him Wednesday Done Eucerin lotion, Isotoner glove  and soft stockinet Artiflex 10 cm wrist, hand , wrist to forearm , 15 cm Artiflex wrist, hand and then back up to axilla  8 cm short stretch wrist , hand , wrist to mid forearm , 10 cm wrist , hand , wrist and up circular 50% over lay to axilla   Reinforce many times with pt and son -if issues and increase pressure, swelling or pain at upper arm or shoulder  -to remove and just do light one layer bandage  with isotoner glove, soft stockinet  Review again and can cont to do light  MLD on upper  arm over top of shoulder   Return in week for follow up  OT SCREEN 08/22/21:    Pt arrive with  compression off for about 2-3 hrs - had this am his 2nd radiation session Son with pt and did change his bandages every 48 hrs and then daily since radiation started  and will continues between him and sister doing it  Circumference taken for L UE Measurements decrease in upper arm, wrist and hand  Fibrosis of upper arm decreasing - decrease shoulder AROM , elbow flexion end range limited by swelling but sup/pro, wrist and hand WFL Would not recommend at this time for pt to try and donn 20-324mG over the counter compression sleeve -do not have the strength and will reinjured UE -and  compression sleeve do not decrease but maintain. Recommend 3 layer bandage to cont with - OT done bandaging again today and son recorded it and provided him hand out too  OT will do today and son /dDerek Jackill repeat every day after radiation until -OT will check on him Wednesday Done Eucerin lotion, Isotoner glove  and soft stockinet Artiflex 10 cm wrist, hand , wrist to forearm , 15 cm Artiflex wrist, hand and then back up to axilla  6 cm short stretch 3 x thru and over hand to forearm figure 8's, 8 cm short stretch wrist , hand , to elbow  figure 8's, 10 cm  mid forearm to  upper arm - stop below radiation area and circular   Reinforce many times with pt and son -if issues and increase pressure,  swelling or pain at upper arm or shoulder -to remove and just do light one layer bandage  with isotoner glove, soft stockinet   Return in week for follow up                                                  Patient will benefit from skilled therapeutic intervention in order to improve the following deficits and impairments:           Visit  Diagnosis: Lymphedema, not elsewhere classified    Problem List Patient Active Problem List   Diagnosis Date Noted   Lymphedema 07/20/2021   Swelling of arm 07/17/2021   Great toe pain, left 02/06/2021   Advance directive discussed with patient 02/06/2021   Genetic testing 09/07/2020   Family history of colon cancer    Family history of brain cancer    Family history of stomach cancer    Family history of melanoma    Goals of care, counseling/discussion 06/23/2020   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

## 2021-08-23 ENCOUNTER — Ambulatory Visit
Admission: RE | Admit: 2021-08-23 | Discharge: 2021-08-23 | Disposition: A | Discharge status: Home / Self Care | Payer: Medicare Other | Source: Ambulatory Visit | Attending: Radiation Oncology | Admitting: Radiation Oncology

## 2021-08-23 DIAGNOSIS — Z5111 Encounter for antineoplastic chemotherapy: Secondary | ICD-10-CM | POA: Diagnosis not present

## 2021-08-24 ENCOUNTER — Other Ambulatory Visit: Payer: Self-pay | Admitting: Oncology

## 2021-08-24 ENCOUNTER — Ambulatory Visit
Admission: RE | Admit: 2021-08-24 | Discharge: 2021-08-24 | Disposition: A | Discharge status: Home / Self Care | Payer: Medicare Other | Source: Ambulatory Visit | Attending: Radiation Oncology | Admitting: Radiation Oncology

## 2021-08-24 DIAGNOSIS — C159 Malignant neoplasm of esophagus, unspecified: Secondary | ICD-10-CM

## 2021-08-24 DIAGNOSIS — Z5111 Encounter for antineoplastic chemotherapy: Secondary | ICD-10-CM | POA: Diagnosis not present

## 2021-08-24 MED ORDER — LOPERAMIDE HCL 2 MG PO CAPS: 2.0000 mg | ORAL_CAPSULE | ORAL | 3 refills | Status: DC | PRN

## 2021-08-24 NOTE — Progress Notes (Signed)
DISCONTINUE OFF PATHWAY REGIMEN - Gastroesophageal   [id number]:Fam-trastuzumab deruxtecan-nxki 6.4 mg/kg IV D1 q21 Days:   A cycle is every 21 days:     Fam-trastuzumab deruxtecan-nxki   **Always confirm dose/schedule in your pharmacy ordering system**  REASON: Other Reason PRIOR TREATMENT: Off Pathway: Fam-trastuzumab deruxtecan-nxki 6.4 mg/kg IV D1 q21 Days TREATMENT RESPONSE: Unable to Evaluate  START OFF PATHWAY REGIMEN - Gastroesophageal   [id number]:FOLFIRI (Leucovorin IV D1 + Fluorouracil IV D1/CIV D1,2 + Irinotecan IV D1) q14 Days:   A cycle is every 14 days:     Irinotecan      Leucovorin      Fluorouracil      Fluorouracil   **Always confirm dose/schedule in your pharmacy ordering system**  Patient Characteristics: Distant Metastases (cM1/pM1) / Locally Recurrent Disease, Adenocarcinoma - Esophageal, GE Junction, and Gastric, Second Line, MSS/pMMR or MSI Unknown Histology: Adenocarcinoma Disease Classification: GE Junction Therapeutic Status: Distant Metastases (No Additional Staging) Line of Therapy: Second Line Microsatellite/Mismatch Repair Status: MSS/pMMR Intent of Therapy: Non-Curative / Palliative Intent, Discussed with Patient

## 2021-08-27 ENCOUNTER — Inpatient Hospital Stay: Payer: Medicare Other

## 2021-08-27 ENCOUNTER — Ambulatory Visit: Payer: Medicare Other

## 2021-08-27 ENCOUNTER — Ambulatory Visit
Admission: RE | Admit: 2021-08-27 | Discharge: 2021-08-27 | Disposition: A | Discharge status: Home / Self Care | Payer: Medicare Other | Source: Ambulatory Visit | Attending: Radiation Oncology | Admitting: Radiation Oncology

## 2021-08-27 ENCOUNTER — Inpatient Hospital Stay: Payer: Medicare Other | Admitting: Oncology

## 2021-08-27 ENCOUNTER — Other Ambulatory Visit: Payer: Self-pay | Admitting: *Deleted

## 2021-08-27 DIAGNOSIS — Z5111 Encounter for antineoplastic chemotherapy: Secondary | ICD-10-CM | POA: Diagnosis not present

## 2021-08-27 MED ORDER — FENTANYL 25 MCG/HR TD PT72: 1.0000 | MEDICATED_PATCH | TRANSDERMAL | 0 refills | Status: DC

## 2021-08-27 MED ORDER — FUROSEMIDE 20 MG PO TABS: 20.0000 mg | ORAL_TABLET | Freq: Every day | ORAL | 0 refills | Status: DC

## 2021-08-28 ENCOUNTER — Ambulatory Visit
Admission: RE | Admit: 2021-08-28 | Discharge: 2021-08-28 | Disposition: A | Discharge status: Home / Self Care | Payer: Medicare Other | Source: Ambulatory Visit | Attending: Radiation Oncology | Admitting: Radiation Oncology

## 2021-08-28 DIAGNOSIS — Z5111 Encounter for antineoplastic chemotherapy: Secondary | ICD-10-CM | POA: Diagnosis not present

## 2021-08-29 ENCOUNTER — Inpatient Hospital Stay: Payer: Medicare Other

## 2021-08-29 ENCOUNTER — Inpatient Hospital Stay (HOSPITAL_BASED_OUTPATIENT_CLINIC_OR_DEPARTMENT_OTHER): Payer: Medicare Other | Admitting: Oncology

## 2021-08-29 ENCOUNTER — Other Ambulatory Visit: Payer: Self-pay

## 2021-08-29 ENCOUNTER — Ambulatory Visit
Admission: RE | Admit: 2021-08-29 | Discharge: 2021-08-29 | Disposition: A | Discharge status: Home / Self Care | Payer: Medicare Other | Source: Ambulatory Visit | Attending: Radiation Oncology | Admitting: Radiation Oncology

## 2021-08-29 ENCOUNTER — Inpatient Hospital Stay: Payer: Medicare Other | Admitting: Occupational Therapy

## 2021-08-29 ENCOUNTER — Encounter: Payer: Self-pay | Admitting: Oncology

## 2021-08-29 VITALS — BP 117/66 | HR 82 | Temp 97.4°F | Ht 71.0 in | Wt 195.6 lb

## 2021-08-29 DIAGNOSIS — C159 Malignant neoplasm of esophagus, unspecified: Secondary | ICD-10-CM

## 2021-08-29 DIAGNOSIS — I890 Lymphedema, not elsewhere classified: Secondary | ICD-10-CM

## 2021-08-29 DIAGNOSIS — Z5111 Encounter for antineoplastic chemotherapy: Secondary | ICD-10-CM | POA: Diagnosis not present

## 2021-08-29 LAB — COMPREHENSIVE METABOLIC PANEL
ALT: 15 U/L (ref 0–44)
AST: 39 U/L (ref 15–41)
Albumin: 2.8 g/dL — ABNORMAL LOW (ref 3.5–5.0)
Alkaline Phosphatase: 418 U/L — ABNORMAL HIGH (ref 38–126)
Anion gap: 10 (ref 5–15)
BUN: 5 mg/dL — ABNORMAL LOW (ref 8–23)
CO2: 29 mmol/L (ref 22–32)
Calcium: 8.6 mg/dL — ABNORMAL LOW (ref 8.9–10.3)
Chloride: 100 mmol/L (ref 98–111)
Creatinine, Ser: 0.68 mg/dL (ref 0.61–1.24)
GFR, Estimated: >60 mL/min (ref >60)
Glucose, Bld: 116 mg/dL — ABNORMAL HIGH (ref 70–99)
Potassium: 3.7 mmol/L (ref 3.5–5.1)
Sodium: 139 mmol/L (ref 135–145)
Total Bilirubin: 0.7 mg/dL (ref 0.3–1.2)
Total Protein: 5.8 g/dL — ABNORMAL LOW (ref 6.5–8.1)

## 2021-08-29 LAB — CBC WITH DIFFERENTIAL/PLATELET
Abs Immature Granulocytes: 0.06 10*3/uL (ref 0.00–0.07)
Basophils Absolute: 0 10*3/uL (ref 0.0–0.1)
Basophils Relative: 0 %
Eosinophils Absolute: 0.1 10*3/uL (ref 0.0–0.5)
Eosinophils Relative: 1 %
HCT: 33.4 % — ABNORMAL LOW (ref 39.0–52.0)
Hemoglobin: 10.4 g/dL — ABNORMAL LOW (ref 13.0–17.0)
Immature Granulocytes: 1 %
Lymphocytes Relative: 12 %
Lymphs Abs: 0.8 10*3/uL (ref 0.7–4.0)
MCH: 29.4 pg (ref 26.0–34.0)
MCHC: 31.1 g/dL (ref 30.0–36.0)
MCV: 94.4 fL (ref 80.0–100.0)
Monocytes Absolute: 0.8 10*3/uL (ref 0.1–1.0)
Monocytes Relative: 12 %
Neutro Abs: 4.9 10*3/uL (ref 1.7–7.7)
Neutrophils Relative %: 74 %
Platelets: 396 10*3/uL (ref 150–400)
RBC: 3.54 MIL/uL — ABNORMAL LOW (ref 4.22–5.81)
RDW: 19 % — ABNORMAL HIGH (ref 11.5–15.5)
WBC: 6.7 10*3/uL (ref 4.0–10.5)
nRBC: 0 % (ref 0.0–0.2)

## 2021-08-29 NOTE — Progress Notes (Signed)
Hematology/Oncology Consult note [facility]  [phone] [phone]  Patient Care Team: Venia Carbon, MD as PCP - General [name], RN as Oncology Nurse Navigator [name], MD as Consulting Physician (Hematology and Oncology)   Name of the patient: [name]  [id number]  April 17, 1956   Date of visit: 08/29/21  Diagnosis- metastatic esophageal cancer with liver metastases  Chief complaint/ Reason for visit-on treatment assessment prior to cycle 1 of FOLFIRI chemotherapy  Heme/Onc history: Patient is a 66 year old male who presented with symptoms of indigestion and heartburn which gradually progressed to dysphagia.  He underwent EGD on 06/20/2020 By Dr. Vicente Males which showed a large nearly obstructing mass in the lower third of the esophagus 35 cm from incisors.  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  PD-l1 <1 %, TMB HIGH    Presently patient reports dysphagia but is able to eat cereal or soft food.  He has lost about 13 pounds in the last month itself.  Denies any significant pain.  He recently retired after many years of service and is otherwise independent of his ADLs and IADLs.     PET CT scan on 06/26/2020 showed circumferential distal esophageal mass with an SUV of 13.3 extending over 5.5 cm. Right lower paratracheal node is 0.6 cm with an SUV of 3.1. Hypermetabolic right gastric lymph nodes including 1.1 cm node with an SUV of 5.8. Numerous hypermetabolic lesions throughout the liver somewhat confluent around the periphery which were hypermetabolic between 8 and 9. Faintly hypermetabolic left adrenal gland   NGS testing showed high tumor mutational burden CPS score less than 1. CD9-NRG1 fusion. BRAF 581L, CCN E1 gain.  EMLA for fusion of BX [id number], FGF 23 gain, FGF 6 gain, MET R988C, T p53 S1 78F. ERBB2 gain but no other actionable mutations   Patient currently on first-line  FOLFOX/trastuzumab/Keytruda.  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  Case discussed with neurosurgery and this spot was thought to be difficult to biopsy.    MRI brain in December 2022 showed significant enlargement of this mass to 4 cm with associated edema in the bilateral occipital lobes and marginal hemorrhage along the left aspect of the mass.  Increased subdural hematoma along the left cerebral convexity.  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  Patient received [place] radiation since single fraction to this mass.   PET scan inNovember 2022 showed interval resolution of activity in the esophagus as well as multifocal hepatic metastases and gastrohepatic lymph node.  No evidence of metastatic mediastinal lymph nodes but a small new right lower lobe lung nodule without metabolic activity was seen.  Patient therefore continued 5-FU trastuzumab Keytruda with last cycle given on 07/04/2021.    EchocardiogramIn November 2022 showed EF of 50 to 55% with low normal ejection fraction but no other abnormalities   Patient noted to have significant swelling in his right upper extremity.  CT venogram did not show any vascular stenosis and patient was seen by vascular surgery.  Swelling has been attributed secondary to his fracture that he has had in the past following a fall which has not been operated upon and was allowed to heal by secondary intent   Patient seen at [facility]  Dr. Oralia Rud for second opinion.  They agreed upon Enhertu for second line treatment if he continues to remain HER2 positive on repeat testing.  Patient received 1 cycle of Enhertu but repeat testing subsequently showed her to negativity and was therefore switched to FOLFIRI chemotherapy  Interval history-patient is gradually  declining at this time.  He has worsening bilateral lower extremity swelling.  Continues to have right upper extremity swelling as well for which he is receiving palliative radiation to be completed next week.  Appetite is poor.  Pain is better controlled after increasing the dose of fentanyl patch.  He is still using as needed oxycodone every 4-6 hours  ECOG PS- 2 Pain scale- 4 Opioid associated constipation- no  Review of systems- Review of Systems  Constitutional:  Positive for malaise/fatigue and weight loss. Negative for chills and fever.  HENT:  Negative for congestion, ear discharge and nosebleeds.   Eyes:  Negative for blurred vision.  Respiratory:  Negative for cough, hemoptysis, sputum production, shortness of breath and wheezing.   Cardiovascular:  Positive for leg swelling. Negative for chest pain, palpitations, orthopnea and claudication.  Gastrointestinal:  Negative for abdominal pain, blood in stool, constipation, diarrhea, heartburn, melena, nausea and vomiting.  Genitourinary:  Negative for dysuria, flank pain, frequency, hematuria and urgency.  Musculoskeletal:  Negative for back pain, joint pain and myalgias.       Right arm pain  Skin:  Negative for rash.  Neurological:  Negative for dizziness, tingling, focal weakness, seizures, weakness and headaches.  Endo/Heme/Allergies:  Does not bruise/bleed easily.  Psychiatric/Behavioral:  Negative for depression and suicidal ideas. The patient does not have insomnia.      No Known Allergies   Past Medical History:  Diagnosis Date   Esophageal adenocarcinoma ([place]) 06/23/2020   Family history of brain cancer    Family history of colon cancer    Family history of melanoma    Family history of stomach cancer    GERD (gastroesophageal reflux disease)    History of kidney stones    Nephrolithiasis    [facility]   Pancreatitis, acute    Personal history of colonic polyps    Dr Nicolasa Ducking   Psoriasis      Past Surgical  History:  Procedure Laterality Date   COLONOSCOPY     COLONOSCOPY WITH PROPOFOL N/A 03/31/2020   Procedure: COLONOSCOPY WITH PROPOFOL;  Surgeon: Jonathon Bellows, MD;  Location: [facility] ENDOSCOPY;  Service: Gastroenterology;  Laterality: N/A;   ERCP     ESOPHAGOGASTRODUODENOSCOPY (EGD) WITH PROPOFOL N/A 06/19/2020   Procedure: ESOPHAGOGASTRODUODENOSCOPY (EGD) WITH PROPOFOL;  Surgeon: Jonathon Bellows, MD;  Location: [facility] ENDOSCOPY;  Service: Gastroenterology;  Laterality: N/A;   groin surgery     as a child   PORTA CATH INSERTION N/A 06/28/2020   Procedure: PORTA CATH INSERTION;  Surgeon: Algernon Huxley, MD;  Location: [place] CV LAB;  Service: Cardiovascular;  Laterality: N/A;   SHOULDER SURGERY     UPPER EXTREMITY VENOGRAPHY Right 07/24/2021   Procedure: UPPER EXTREMITY VENOGRAPHY;  Surgeon: Katha Cabal, MD;  Location: [place] CV LAB;  Service: Cardiovascular;  Laterality: Right;    Social History   Socioeconomic History   Marital status: Married    Spouse name: Not on file   Number of children: 2   Years of education: Not on file   Highest education level: Not on file  Occupational History   Occupation: Filtration and duct work    Comment: [employer]  Tobacco Use  Smoking status: Former    Types: Cigarettes    Quit date: 07/08/1988    Years since quitting: 33.1   Smokeless tobacco: Never  Vaping Use   Vaping Use: Never used  Substance and Sexual Activity   Alcohol use: No    Comment: recovering alcoholic for 13 years   Drug use: No   Sexual activity: Not Currently  Other Topics Concern   Not on file  Social History Narrative   Has living will--needs to get notarized   Daughter should be health care POA (wife with dementia)   Would accept resuscitation   Would accept tube feeds depending on the circumstance   Social Determinants of Health   Financial Resource Strain: Not on file  Food Insecurity: Not on file  Transportation Needs: Not on file  Physical  Activity: Not on file  Stress: Not on file  Social Connections: Not on file  Intimate Partner Violence: Not on file    Family History  Problem Relation Age of Onset   Stomach cancer Mother    Diabetes Brother    Hypertension Brother    Hypertension Father    Prostate cancer Father    Colon cancer Sister    Cancer Other        either cervical or ovarian, d. 26s   Brain cancer Sister      Current Outpatient Medications:    dexamethasone (DECADRON) 4 MG tablet, Take 1 tablet (4 mg total) by mouth 2 (two) times daily., Disp: 28 tablet, Rfl: 0   fentaNYL (DURAGESIC) 25 MCG/HR, Place 1 patch onto the skin every 3 (three) days., Disp: 5 patch, Rfl: 0   furosemide (LASIX) 20 MG tablet, Take 1 tablet (20 mg total) by mouth daily., Disp: 10 tablet, Rfl: 0   lidocaine-prilocaine (EMLA) cream, Apply 1 application topically as needed. Apply small amount to port site at least 1 hour prior to it being accessed, cover with plastic wrap, Disp: 30 g, Rfl: 3   loperamide (IMODIUM) 2 MG capsule, Take 1 capsule (2 mg total) by mouth as needed. Take 2 at diarrhea onset , then 1 every 2hr until 12hrs with no BM. May take 2 every 4hrs at night. If diarrhea recurs repeat., Disp: 30 capsule, Rfl: 3   Oxycodone HCl 10 MG TABS, TAKE 1 TABLET BY MOUTH EVERY 4 HOURS AS NEEDED, Disp: 120 tablet, Rfl: 0   pantoprazole (PROTONIX) 40 MG tablet, TAKE 1 TABLET BY MOUTH TWICE A DAY BEFORE MEALS, Disp: 60 tablet, Rfl: 11   prochlorperazine (COMPAZINE) 10 MG tablet, Take 1 tablet (10 mg total) by mouth every 6 (six) hours as needed for nausea or vomiting., Disp: 30 tablet, Rfl: 0   triamcinolone cream (KENALOG) 0.1 %, Apply 1 application topically 3 (three) times daily as needed., Disp: 300 each, Rfl: 1  Physical exam:  Vitals:   08/29/21 0926  BP: 117/66  Pulse: 82  Temp: (!) 97.4 F (36.3 C)  TempSrc: Tympanic  SpO2: 95%  Weight: 195 lb 9.6 oz (88.7 kg)  Height: _0  (1.803 m)   Physical  Exam Constitutional:      General: He is not in acute distress. Cardiovascular:     Rate and Rhythm: Normal rate and regular rhythm.     Heart sounds: Normal heart sounds.  Pulmonary:     Effort: Pulmonary effort is normal.     Breath sounds: Normal breath sounds.  Abdominal:     General: Bowel sounds are normal.  Palpations: Abdomen is soft.  Musculoskeletal:     Right lower leg: Edema present.     Left lower leg: Edema present.     Comments: right upper extremity swelling noted.  Skin:    General: Skin is warm and dry.  Neurological:     Mental Status: He is alert and oriented to person, place, and time.     CMP Latest Ref Rng & Units 08/29/2021  Glucose 70 - 99 mg/dL 116(H)  BUN 8 - 23 mg/dL 5(L)  Creatinine 0.61 - 1.24 mg/dL 0.68  Sodium 135 - 145 mmol/L 139  Potassium 3.5 - 5.1 mmol/L 3.7  Chloride 98 - 111 mmol/L 100  CO2 22 - 32 mmol/L 29  Calcium 8.9 - 10.3 mg/dL 8.6(L)  Total Protein 6.5 - 8.1 g/dL 5.8(L)  Total Bilirubin 0.3 - 1.2 mg/dL 0.7  Alkaline Phos 38 - 126 U/L 418(H)  AST 15 - 41 U/L 39  ALT 0 - 44 U/L 15   CBC Latest Ref Rng & Units 08/29/2021  WBC 4.0 - 10.5 K/uL 6.7  Hemoglobin 13.0 - 17.0 g/dL 10.4(L)  Hematocrit 39.0 - 52.0 % 33.4(L)  Platelets 150 - 400 K/uL 396      Assessment and plan- Patient is a 66 y.o. male with metastatic GE junction adenocarcinoma here for on treatment assessment prior to cycle 1 of FOLFIRI chemotherapy  Patient received 1 cycle of Enhertu treatment but will not be continued subsequently given that his HER2 testing came back negative.  He will be proceeding with FOLFIRI chemotherapy tomorrow with pump disconnect over the weekend.  I will see him back in 2 weeks for cycle 2 of FOLFIRI chemotherapy  We discussed that hisCancer has recurred in an aggressive fashion and CEA continues to trend up.  He has significant burden of disease as noted with metastatic pleural/lung nodules, hilar adenopathy as well as liver  metastases.  We discussed considering foregoing chemotherapy and proceeding with best supportive care/hospice.  Patient still has a good performance status at this time and would like to try second line chemotherapy at this time.  Patient and his daughter understand that his overall prognosis is poor despite treatments  Neoplasm related pain: Continue fentanyl patch and as needed oxycodone  Bilateral lower extremity swelling: Likely secondary to advanced malignancy as well as hypoalbuminemia.   Visit Diagnosis 1. Esophageal adenocarcinoma ([place])      Dr. Randa Evens, MD, MPH [facility] [phone] 08/29/2021 12:53 PM

## 2021-08-29 NOTE — Therapy (Signed)
[facility] [place], [name], [place], [address] Phone: [phone]   Fax:  [phone]  Occupational Therapy Screen  Patient Details  Name: Cory [name] MRN: [id number] Date of Birth: 1956-07-01 No data recorded  Encounter Date: 08/29/2021   OT End of Session - 08/29/21 1932     Visit Number 0             Past Medical History:  Diagnosis Date   Esophageal adenocarcinoma ([place]) 06/23/2020   Family history of brain cancer    Family history of colon cancer    Family history of melanoma    Family history of stomach cancer    GERD (gastroesophageal reflux disease)    History of kidney stones    Nephrolithiasis    [facility]   Pancreatitis, acute    Personal history of colonic polyps    Dr Nicolasa Ducking   Psoriasis     Past Surgical History:  Procedure Laterality Date   COLONOSCOPY     COLONOSCOPY WITH PROPOFOL N/A 03/31/2020   Procedure: COLONOSCOPY WITH PROPOFOL;  Surgeon: Jonathon Bellows, MD;  Location: [facility] ENDOSCOPY;  Service: Gastroenterology;  Laterality: N/A;   ERCP     ESOPHAGOGASTRODUODENOSCOPY (EGD) WITH PROPOFOL N/A 06/19/2020   Procedure: ESOPHAGOGASTRODUODENOSCOPY (EGD) WITH PROPOFOL;  Surgeon: Jonathon Bellows, MD;  Location: [facility] ENDOSCOPY;  Service: Gastroenterology;  Laterality: N/A;   groin surgery     as a child   PORTA CATH INSERTION N/A 06/28/2020   Procedure: PORTA CATH INSERTION;  Surgeon: Algernon Huxley, MD;  Location: [place] CV LAB;  Service: Cardiovascular;  Laterality: N/A;   SHOULDER SURGERY     UPPER EXTREMITY VENOGRAPHY Right 07/24/2021   Procedure: UPPER EXTREMITY VENOGRAPHY;  Surgeon: Katha Cabal, MD;  Location: [facility] CV LAB;  Service: Cardiovascular;  Laterality: Right;    There were no vitals filed for this visit.   Subjective Assessment - 08/29/21 1931     Subjective  My hand and forearm looked great this am -but then after it is off for hour it starts swelling  -I have 3 more sessoins of radiation left - doing okay                 LYMPHEDEMA/ONCOLOGY QUESTIONNAIRE - 08/29/21 0001       Right Upper Extremity Lymphedema   15 cm Proximal to Olecranon Process 36 cm    10 cm Proximal to Olecranon Process 34 cm    Olecranon Process 34.5 cm    15 cm Proximal to Ulnar Styloid Process 33.5 cm    10 cm Proximal to Ulnar Styloid Process 28 cm    Just Proximal to Ulnar Styloid Process 22.5 cm    Across Hand at [employer] 25 cm              OT SCREEN 08/08/21:   Circumference taken for bilateral UE - R dominant UE increase by 6.5 cm upper arm, elbow 5 cm , forearm 6 cm , wrist 4 cm and hand 3 cm compare to L.  Fibrosis of upper arm and hard - decrease shoulder AROM , elbow flexion end range limited by swelling but sup/pro, wrist and hand WFL Would not recommend at this time for pt to try and donn 20-67mHG over the counter compression sleeve -do not have the strength and will reinjured UE -and  compression sleeve do not decrease but maintain. Done Isotoner glove with pt and Tubigrip D hand  to elbow and tubigrip F from wrist to upper arm -about 59mHg and had trouble to donn that - daughter is at home with him until Friday to assist. Did recommend for them to do one layer bandage for day or 2 - with isotoner glove, soft stockinet and 10 cm short stretch hand and wrist 2 x  wrap and over lap 50% to upper arm. And then try again tubigip D and F with isotoner glove - wear light compression most of the time And ed on soft MLD on upper  arm over top of shoulder -and daughter ed on light fibrosis tech on upper arm prior to MLD   Return in week for follow up     OT SCREEN 08/15/21:   Pt arrive with soft stockinett- isotoner glove and one layer 10cm short stretch to mid upper arm. He report keeping bandages off since seen vascular MD at night time - but feels like swelling is less with compression but increase if not wearing compression Hard time  doing bandage himself. Starting radiation to humerus and axillary ln on R Tuesday. Circumference taken for  L UE - increase from hand to upper arm   Fibrosis of upper arm and hard - decrease shoulder AROM , elbow flexion end range limited by swelling but sup/pro, wrist and hand WFL Would not recommend at this time for pt to try and donn 20-670mG over the counter compression sleeve -do not have the strength and will reinjured UE -and  compression sleeve do not decrease but maintain. Recommend 3 layer bandage at this time - OT will do today and son will redo it Fri and Sunday and Tuesday - OT will check on him Wednesday Done Eucerin lotion, Isotoner glove  and soft stockinet Artiflex 10 cm wrist, hand , wrist to forearm , 15 cm Artiflex wrist, hand and then back up to axilla  8 cm short stretch wrist , hand , wrist to mid forearm , 10 cm wrist , hand , wrist and up circular 50% over lay to axilla   Reinforce many times with pt and son -if issues and increase pressure, swelling or pain at upper arm or shoulder -to remove and just do light one layer bandage  with isotoner glove, soft stockinet  Review again and can cont to do light  MLD on upper  arm over top of shoulder   Return in week for follow up   OT SCREEN 08/22/21: Pt arrive with  compression off for about 2-3 hrs - had this am his 2nd radiation session Son with pt and did change his bandages every 48 hrs and then daily since radiation started  and will continues between him and sister doing it  Circumference taken for L UE Measurements decrease in upper arm, wrist and hand  Fibrosis of upper arm decreasing - decrease shoulder AROM , elbow flexion end range limited by swelling but sup/pro, wrist and hand WFL Would not recommend at this time for pt to try and donn 20-6767m over the counter compression sleeve -do not have the strength and will reinjured UE -and  compression sleeve do not decrease but maintain. Recommend 3 layer bandage to cont  with - OT done bandaging again today and son recorded it and provided him hand out too  OT will do today and son /daDerek Jackll repeat every day after radiation until -OT will check on him Wednesday Done Eucerin lotion, Isotoner glove  and soft stockinet Artiflex 10 cm wrist, hand ,  wrist to forearm , 15 cm Artiflex wrist, hand and then back up to axilla  6 cm short stretch 3 x thru and over hand to forearm figure 8's, 8 cm short stretch wrist , hand , to elbow  figure 8's, 10 cm  mid forearm to  upper arm - stop below radiation area and circular   Reinforce many times with pt and son -if issues and increase pressure, swelling or pain at upper arm or shoulder -to remove and just do light one layer bandage  with isotoner glove, soft stockinet    Return in week for follow up       Assessment and plan DR [name] 08/30/21:  Patient is a 66 y.o. male with metastatic GE junction adenocarcinoma here for on treatment assessment prior to cycle 1 of FOLFIRI chemotherapy   Patient received 1 cycle of Enhertu treatment but will not be continued subsequently given that his HER2 testing came back negative.  He will be proceeding with FOLFIRI chemotherapy tomorrow with pump disconnect over the weekend.  I will see him back in 2 weeks for cycle 2 of FOLFIRI chemotherapy   We discussed that hisCancer has recurred in an aggressive fashion and CEA continues to trend up.  He has significant burden of disease as noted with metastatic pleural/lung nodules, hilar adenopathy as well as liver metastases.  We discussed considering foregoing chemotherapy and proceeding with best supportive care/hospice.  Patient still has a good performance status at this time and would like to try second line chemotherapy at this time.  Patient and his daughter understand that his overall prognosis is poor despite treatments   Neoplasm related pain: Continue fentanyl patch and as needed oxycodone   Bilateral lower extremity swelling: Likely  secondary to advanced malignancy as well as hypoalbuminemia   OT screen 08/29/21:   t arrive with  compression off for about 5hrs - had this am his 7th radiation session Daughter with pt and did change his bandages yesterday- and another CLT did Monday.  Circumference taken for L UE Measurements decrease in fL arm some -provided tubigrip D to use during radiation over isotoner glove -to maintain decongestion in hand and forearm.  Fibrosis of upper arm decreasing - decrease shoulder AROM  but elbow , wrist and hand AROM assess and WFL and review with pt this date Would not recommend at this time for pt to try and donn 20-57mHG over the counter compression sleeve -do not have the strength and will reinjured UE -and  compression sleeve do not decrease but maintain. Recommend 3 layer bandage to cont with - OT done bandaging again today  - son and daughter ed on doing at home -   OT will do today and son /Derek Jackwill repeat every day after radiation until -OT will check next week Done Eucerin lotion, Isotoner glove  and soft stockinet Rosidal foam from hand to upper arm  6 cm short stretch 3 x thru and over hand to forearm figure 8's, 8 cm short stretch wrist , hand , to elbow  figure 8's, 10 cm  mid forearm to  upper arm - stop below radiation area and circular   Reinforce many times with pt and son/daughter -if issues and increase pressure, swelling or pain at upper arm or shoulder -to remove and just do light one layer bandage  with isotoner glove, soft stockinet  Will check on getting pt into circ-aid for pt to do at home for more comfort and maintenance of  L UE lypmphedema    Return next week                                     Visit Diagnosis: Lymphedema, not elsewhere classified    Problem List Patient Active Problem List   Diagnosis Date Noted   Lymphedema 07/20/2021   Swelling of arm 07/17/2021   Great toe pain, left 02/06/2021   Advance directive  discussed with patient 02/06/2021   Genetic testing 09/07/2020   Family history of colon cancer    Family history of brain cancer    Family history of stomach cancer    Family history of melanoma    Goals of care, counseling/discussion 06/23/2020   Esophageal adenocarcinoma ([place]) 06/23/2020   Esophageal dysphagia 05/26/2020   Erectile dysfunction 09/20/2013   Routine general medical examination at a health care facility 09/13/2011   Psoriasis 09/13/2011   ALLERGIC RHINITIS 04/28/2009   GERD 04/28/2009   COLONIC POLYPS, HX OF 04/28/2009    Rosalyn Gess, OTR/L,CLT 08/29/2021, 7:36 PM  [place] at [facility] [address] [place], [place], [address] Phone: [phone]   Fax:  [phone]  Name: BURT PIATEK MRN: [id number] Date of Birth: 1955/11/17

## 2021-08-30 ENCOUNTER — Inpatient Hospital Stay: Payer: Medicare Other

## 2021-08-30 ENCOUNTER — Ambulatory Visit
Admission: RE | Admit: 2021-08-30 | Discharge: 2021-08-30 | Disposition: A | Discharge status: Home / Self Care | Payer: Medicare Other | Source: Ambulatory Visit | Attending: Radiation Oncology | Admitting: Radiation Oncology

## 2021-08-30 VITALS — BP 119/60 | HR 74 | Temp 97.0°F | Resp 20

## 2021-08-30 DIAGNOSIS — Z5111 Encounter for antineoplastic chemotherapy: Secondary | ICD-10-CM | POA: Diagnosis not present

## 2021-08-30 DIAGNOSIS — C159 Malignant neoplasm of esophagus, unspecified: Secondary | ICD-10-CM

## 2021-08-30 LAB — CEA: CEA: 471 ng/mL — ABNORMAL HIGH (ref 0.0–4.7)

## 2021-08-30 MED ORDER — SODIUM CHLORIDE 0.9 % IV SOLN: 2400.0000 mg/m2 | INTRAVENOUS | Status: DC

## 2021-08-30 MED ORDER — SODIUM CHLORIDE 0.9 % IV SOLN
180.0000 mg/m2 | Freq: Once | INTRAVENOUS | Status: AC
  Administered 2021-08-30: 380 mg via INTRAVENOUS
  Filled 2021-08-30: qty 15

## 2021-08-30 MED ORDER — SODIUM CHLORIDE 0.9 % IV SOLN
10.0000 mg | Freq: Once | INTRAVENOUS | Status: AC
  Administered 2021-08-30: 10 mg via INTRAVENOUS
  Filled 2021-08-30: qty 10

## 2021-08-30 MED ORDER — SODIUM CHLORIDE 0.9 % IV SOLN
5000.0000 mg | INTRAVENOUS | Status: DC
  Administered 2021-08-30: 5000 mg via INTRAVENOUS
  Filled 2021-08-30: qty 100

## 2021-08-30 MED ORDER — ATROPINE SULFATE 1 MG/ML IV SOLN
0.5000 mg | Freq: Once | INTRAVENOUS | Status: AC | PRN
  Administered 2021-08-30: 0.5 mg via INTRAVENOUS
  Filled 2021-08-30: qty 1

## 2021-08-30 MED ORDER — SODIUM CHLORIDE 0.9 % IV SOLN
Freq: Once | INTRAVENOUS | Status: AC
  Filled 2021-08-30: qty 250

## 2021-08-30 MED ORDER — PALONOSETRON HCL INJECTION 0.25 MG/5ML
0.2500 mg | Freq: Once | INTRAVENOUS | Status: AC
  Administered 2021-08-30: 0.25 mg via INTRAVENOUS
  Filled 2021-08-30: qty 5

## 2021-08-30 MED ORDER — SODIUM CHLORIDE 0.9 % IV SOLN
850.0000 mg | Freq: Once | INTRAVENOUS | Status: AC
  Administered 2021-08-30: 850 mg via INTRAVENOUS
  Filled 2021-08-30: qty 42.5

## 2021-08-30 MED ORDER — FLUOROURACIL CHEMO INJECTION 2.5 GM/50ML
400.0000 mg/m2 | Freq: Once | INTRAVENOUS | Status: AC
  Administered 2021-08-30: 850 mg via INTRAVENOUS
  Filled 2021-08-30: qty 17

## 2021-08-30 NOTE — Patient Instructions (Signed)
[facility] [place]  Discharge Instructions: Thank you for choosing [facility] to provide your oncology and hematology care.  If you have a lab appointment with the [facility], please go directly to the [facility] and check in at the registration area.  Wear comfortable clothing and clothing appropriate for easy access to any Portacath or PICC line.   We strive to give you quality time with your provider. You may need to reschedule your appointment if you arrive late (15 or more minutes).  Arriving late affects you and other patients whose appointments are after yours.  Also, if you miss three or more appointments without notifying the office, you may be dismissed from the clinic at the providers discretion.      For prescription refill requests, have your pharmacy contact our office and allow 72 hours for refills to be completed.    Today you received the following chemotherapy and/or immunotherapy agents irinotecan, adrucil    Irinotecan injection What is this medication? IRINOTECAN (ir in oh TEE kan ) is a chemotherapy drug. It is used to treat colon and rectal cancer. This medicine may be used for other purposes; ask your health care provider or pharmacist if you have questions. COMMON BRAND NAME(S): Camptosar What should I tell my care team before I take this medication? They need to know if you have any of these conditions: dehydration diarrhea infection (especially a virus infection such as chickenpox, cold sores, or herpes) liver disease low blood counts, like low white cell, platelet, or red cell counts low levels of calcium, magnesium, or potassium in the blood recent or ongoing radiation therapy an unusual or allergic reaction to irinotecan, other medicines, foods, dyes, or preservatives pregnant or trying to get pregnant breast-feeding How should I use this medication? This drug is given as an infusion into a vein. It is  administered in a hospital or clinic by a specially trained health care professional. Talk to your pediatrician regarding the use of this medicine in children. Special care may be needed. Overdosage: If you think you have taken too much of this medicine contact a poison control center or emergency room at once. NOTE: This medicine is only for you. Do not share this medicine with others. What if I miss a dose? It is important not to miss your dose. Call your doctor or health care professional if you are unable to keep an appointment. What may interact with this medication? Do not take this medicine with any of the following medications: cobicistat itraconazole This medicine may interact with the following medications: antiviral medicines for HIV or AIDS certain antibiotics like rifampin or rifabutin certain medicines for fungal infections like ketoconazole, posaconazole, and voriconazole certain medicines for seizures like carbamazepine, phenobarbital, phenotoin clarithromycin gemfibrozil nefazodone St. John's Wort This list may not describe all possible interactions. Give your health care provider a list of all the medicines, herbs, non-prescription drugs, or dietary supplements you use. Also tell them if you smoke, drink alcohol, or use illegal drugs. Some items may interact with your medicine. What should I watch for while using this medication? Your condition will be monitored carefully while you are receiving this medicine. You will need important blood work done while you are taking this medicine. This drug may make you feel generally unwell. This is not uncommon, as chemotherapy can affect healthy cells as well as cancer cells. Report any side effects. Continue your course of treatment even though you feel ill unless  your doctor tells you to stop. In some cases, you may be given additional medicines to help with side effects. Follow all directions for their use. You may get drowsy or  dizzy. Do not drive, use machinery, or do anything that needs mental alertness until you know how this medicine affects you. Do not stand or sit up quickly, especially if you are an older patient. This reduces the risk of dizzy or fainting spells. Call your health care professional for advice if you get a fever, chills, or sore throat, or other symptoms of a cold or flu. Do not treat yourself. This medicine decreases your body's ability to fight infections. Try to avoid being around people who are sick. Avoid taking products that contain aspirin, acetaminophen, ibuprofen, naproxen, or ketoprofen unless instructed by your doctor. These medicines may hide a fever. This medicine may increase your risk to bruise or bleed. Call your doctor or health care professional if you notice any unusual bleeding. Be careful brushing and flossing your teeth or using a toothpick because you may get an infection or bleed more easily. If you have any dental work done, tell your dentist you are receiving this medicine. Do not become pregnant while taking this medicine or for 6 months after stopping it. Women should inform their health care professional if they wish to become pregnant or think they might be pregnant. Men should not father a child while taking this medicine and for 3 months after stopping it. There is potential for serious side effects to an unborn child. Talk to your health care professional for more information. Do not breast-feed an infant while taking this medicine or for 7 days after stopping it. This medicine has caused ovarian failure in some women. This medicine may make it more difficult to get pregnant. Talk to your health care professional if you are concerned about your fertility. This medicine has caused decreased sperm counts in some men. This may make it more difficult to father a child. Talk to your health care professional if you are concerned about your fertility. What side effects may I notice  from receiving this medication? Side effects that you should report to your doctor or health care professional as soon as possible: allergic reactions like skin rash, itching or hives, swelling of the face, lips, or tongue chest pain diarrhea flushing, runny nose, sweating during infusion low blood counts - this medicine may decrease the number of white blood cells, red blood cells and platelets. You may be at increased risk for infections and bleeding. nausea, vomiting pain, swelling, warmth in the leg signs of decreased platelets or bleeding - bruising, pinpoint red spots on the skin, black, tarry stools, blood in the urine signs of infection - fever or chills, cough, sore throat, pain or difficulty passing urine signs of decreased red blood cells - unusually weak or tired, fainting spells, lightheadedness Side effects that usually do not require medical attention (report to your doctor or health care professional if they continue or are bothersome): constipation hair loss headache loss of appetite mouth sores stomach pain This list may not describe all possible side effects. Call your doctor for medical advice about side effects. You may report side effects to FDA at 1-800-FDA-1088. Where should I keep my medication? This drug is given in a hospital or clinic and will not be stored at home. NOTE: This sheet is a summary. It may not cover all possible information. If you have questions about this medicine, talk to your  doctor, pharmacist, or health care provider.  2022 Elsevier/Gold Standard (2021-03-13 00:00:00)    To help prevent nausea and vomiting after your treatment, we encourage you to take your nausea medication as directed.  BELOW ARE SYMPTOMS THAT SHOULD BE REPORTED IMMEDIATELY: *FEVER GREATER THAN 100.4 F (38 C) OR HIGHER *CHILLS OR SWEATING *NAUSEA AND VOMITING THAT IS NOT CONTROLLED WITH YOUR NAUSEA MEDICATION *UNUSUAL SHORTNESS OF BREATH *UNUSUAL BRUISING OR  BLEEDING *URINARY PROBLEMS (pain or burning when urinating, or frequent urination) *BOWEL PROBLEMS (unusual diarrhea, constipation, pain near the anus) TENDERNESS IN MOUTH AND THROAT WITH OR WITHOUT PRESENCE OF ULCERS (sore throat, sores in mouth, or a toothache) UNUSUAL RASH, SWELLING OR PAIN  UNUSUAL VAGINAL DISCHARGE OR ITCHING   Items with * indicate a potential emergency and should be followed up as soon as possible or go to the Emergency Department if any problems should occur.  Please show the CHEMOTHERAPY ALERT CARD or IMMUNOTHERAPY ALERT CARD at check-in to the Emergency Department and triage nurse.  Should you have questions after your visit or need to cancel or reschedule your appointment, please contact [facility] [place] AT [place]  [phone] and follow the prompts.  Office hours are 8:00 a.m. to 4:30 p.m. Monday - Friday. Please note that voicemails left after 4:00 p.m. may not be returned until the following business day.  We are closed weekends and major holidays. You have access to a nurse at all times for urgent questions. Please call the main number to the clinic [phone] and follow the prompts.  For any non-urgent questions, you may also contact your provider using MyChart. We now offer e-Visits for anyone 45 and older to request care online for non-urgent symptoms. For details visit [email or web address].   Also download the MyChart app! Go to the app store, search "MyChart", open the app, select [facility], and log in with your MyChart username and password.  Due to Covid, a mask is required upon entering the hospital/clinic. If you do not have a mask, one will be given to you upon arrival. For doctor visits, patients may have 1 support person aged 61 or older with them. For treatment visits, patients cannot have anyone with them due to current Covid guidelines and our immunocompromised population.

## 2021-08-31 ENCOUNTER — Ambulatory Visit
Admission: RE | Admit: 2021-08-31 | Discharge: 2021-08-31 | Disposition: A | Discharge status: Home / Self Care | Payer: Medicare Other | Source: Ambulatory Visit | Attending: Radiation Oncology | Admitting: Radiation Oncology

## 2021-08-31 DIAGNOSIS — Z5111 Encounter for antineoplastic chemotherapy: Secondary | ICD-10-CM | POA: Diagnosis not present

## 2021-08-31 MED ORDER — PEGFILGRASTIM-CBQV 6 MG/0.6ML ~~LOC~~ SOSY
6.0000 mg | PREFILLED_SYRINGE | Freq: Once | SUBCUTANEOUS | Status: AC
  Administered 2021-09-01: 6 mg via SUBCUTANEOUS

## 2021-09-01 ENCOUNTER — Inpatient Hospital Stay: Payer: Medicare Other

## 2021-09-01 DIAGNOSIS — C159 Malignant neoplasm of esophagus, unspecified: Secondary | ICD-10-CM

## 2021-09-01 DIAGNOSIS — Z5111 Encounter for antineoplastic chemotherapy: Secondary | ICD-10-CM | POA: Diagnosis not present

## 2021-09-01 MED ORDER — HEPARIN SOD (PORK) LOCK FLUSH 100 UNIT/ML IV SOLN
500.0000 [IU] | Freq: Once | INTRAVENOUS | Status: AC
  Administered 2021-09-01: 500 [IU] via INTRAVENOUS
  Filled 2021-09-01: qty 5

## 2021-09-03 ENCOUNTER — Ambulatory Visit
Admission: RE | Admit: 2021-09-03 | Discharge: 2021-09-03 | Disposition: A | Discharge status: Home / Self Care | Payer: Medicare Other | Source: Ambulatory Visit | Attending: Radiation Oncology | Admitting: Radiation Oncology

## 2021-09-03 ENCOUNTER — Inpatient Hospital Stay: Payer: Medicare Other

## 2021-09-03 DIAGNOSIS — Z5111 Encounter for antineoplastic chemotherapy: Secondary | ICD-10-CM | POA: Diagnosis not present

## 2021-09-04 ENCOUNTER — Other Ambulatory Visit: Payer: Self-pay

## 2021-09-04 ENCOUNTER — Ambulatory Visit: Payer: Medicare Other | Attending: Internal Medicine | Admitting: Occupational Therapy

## 2021-09-04 DIAGNOSIS — I890 Lymphedema, not elsewhere classified: Secondary | ICD-10-CM | POA: Insufficient documentation

## 2021-09-04 NOTE — Therapy (Signed)
[facility] 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   Family history of brain cancer    Family history of colon cancer    Family history of melanoma    Family history of stomach cancer    GERD (gastroesophageal reflux disease)    History of kidney stones    Nephrolithiasis    [facility]   Pancreatitis, acute    Personal history of colonic polyps    Dr Nicolasa Ducking   Psoriasis     Past Surgical History:  Procedure Laterality Date   COLONOSCOPY     COLONOSCOPY WITH PROPOFOL N/A 03/31/2020   Procedure: COLONOSCOPY WITH PROPOFOL;  Surgeon: Jonathon Bellows, MD;  Location: [facility] ENDOSCOPY;  Service: Gastroenterology;  Laterality: N/A;   ERCP     ESOPHAGOGASTRODUODENOSCOPY (EGD) WITH PROPOFOL N/A 06/19/2020   Procedure: ESOPHAGOGASTRODUODENOSCOPY (EGD) WITH PROPOFOL;  Surgeon: Jonathon Bellows, MD;  [facility] END[facility]COPY;  Service: Gastroenterology;  Laterality: N/A;   groin surgery     as a child   PORTA CATH INSERTION N/A 06/28/2020   Procedure: PORTA CATH INSERTION;  Surgeon: Algernon Huxley, MD;  Location: [facility] CV LAB;  Service: Cardiovascular;  Laterality: N/A;   SHOULDER SURGERY     UPPER EXTREMITY VENOGRAPHY Right 07/24/2021   Procedure: UPPER EXTREMITY VENOGRAPHY;  Surgeon: Katha Cabal, MD;  Location: [facility] LAB;  Service: Cardiovascular;  Laterality: Right;    There were no vitals filed for this visit.   Subjective Assessment - 09/04/21 1302     Subjective  Had my last radiation session yesterday -and my arm is much smaller since Sunday - arm  feels lighter    Currently in Pain? No/denies                 LYMPHEDEMA/ONCOLOGY QUESTIONNAIRE - 09/04/21 0001       Right Upper Extremity Lymphedema   15 cm Proximal to Olecranon Process 34 cm    10 cm Proximal to Olecranon Process 30 cm    Olecranon Process 29.5 cm    15 cm Proximal to Ulnar Styloid Process 27.5 cm    10 cm Proximal to Ulnar Styloid Process 22.4 cm    Just Proximal to Ulnar Styloid Process 19 cm    Across Hand at [employer] 22.5 cm      Left Upper Extremity Lymphedema   15 cm Proximal to Olecranon Process 30.8 cm    10 cm Proximal to Olecranon Process 28.4 cm    Olecranon Process 27.4 cm    15 cm Proximal to Ulnar Styloid Process 25.5 cm    10 cm Proximal to Ulnar Styloid Process 22 cm    Just Proximal to Ulnar Styloid Process 22 cm                OT SCREEN 08/08/21:   Circumference taken for bilateral UE - R dominant UE increase by 6.5 cm upper arm, elbow 5 cm , forearm 6 cm , wrist 4 cm and hand 3 cm compare to L.  Fibrosis of upper arm and hard - decrease shoulder AROM , elbow flexion end range limited by swelling but sup/pro, wrist and hand WFL Would not recommend at this time for pt to try and donn 20-92mHG over the counter compression sleeve -do not have the strength and will reinjured UE -and  compression sleeve do not decrease but maintain. Done Isotoner glove with pt and Tubigrip D hand to elbow and tubigrip F from wrist to upper arm -about 945mg and had trouble to donn that - daughter is at home with him until Friday to assist. Did recommend for them to do one layer bandage for day or 2 - with isotoner glove, soft stockinet and 10 cm short stretch hand and wrist 2 x  wrap and over lap 50% to upper arm. And then try again tubigip D and F with isotoner glove - wear light compression most of the time And ed on soft MLD on upper  arm over top of shoulder -and daughter ed on light fibrosis tech on upper arm prior to MLD   Return in week  for follow up     OT SCREEN 08/15/21:   Pt arrive with soft stockinett- isotoner glove and one layer 10cm short stretch to mid upper arm. He report keeping bandages off since seen vascular MD at night time - but feels like swelling is less with compression but increase if not wearing compression Hard time doing bandage himself. Starting radiation to humerus and axillary ln on R Tuesday. Circumference taken for  L UE - increase from hand to upper arm   Fibrosis of upper arm and hard - decrease shoulder AROM , elbow flexion end range limited by swelling but sup/pro, wrist and hand WFL Would not recommend at this time for pt to try and donn 20-3013m over the counter compression sleeve -do not have the strength and will reinjured UE -and  compression sleeve do not decrease but maintain. Recommend 3 layer bandage at this time - OT will do today and son will redo it Fri and Sunday and Tuesday - OT will check on him Wednesday Done Eucerin lotion, Isotoner glove  and soft stockinet Artiflex 10 cm wrist, hand , wrist to forearm , 15 cm Artiflex wrist, hand and then back up to axilla  8 cm short stretch wrist , hand , wrist to mid forearm , 10 cm wrist , hand , wrist and up circular 50% over lay to axilla   Reinforce many times with pt and son -if issues and increase pressure, swelling or pain at upper arm or shoulder -to remove and just do light one layer bandage  with isotoner glove, soft stockinet  Review again and can cont to do light  MLD on upper  arm over top of shoulder   Return in week for follow up   OT SCREEN 08/22/21: Pt arrive with  compression off for about 2-3 hrs - had this am his 2nd radiation session Son with pt and did change his bandages every 48 hrs and then daily since radiation started  and will continues between him and sister doing it  Circumference taken for L UE Measurements decrease in upper arm, wrist and hand  Fibrosis of upper arm decreasing - decrease shoulder AROM ,  elbow flexion end range limited by swelling but sup/pro, wrist and hand WFL Would not recommend at this time for pt to try and donn 20-60m75mover the counter compression sleeve -do not have the strength and will reinjured  UE -and  compression sleeve do not decrease but maintain. Recommend 3 layer bandage to cont with - OT done bandaging again today and son recorded it and provided him hand out too  OT will do today and son Derek Jack will repeat every day after radiation until -OT will check on him Wednesday Done Eucerin lotion, Isotoner glove  and soft stockinet Artiflex 10 cm wrist, hand , wrist to forearm , 15 cm Artiflex wrist, hand and then back up to axilla  6 cm short stretch 3 x thru and over hand to forearm figure 8's, 8 cm short stretch wrist , hand , to elbow  figure 8's, 10 cm  mid forearm to  upper arm - stop below radiation area and circular   Reinforce many times with pt and son -if issues and increase pressure, swelling or pain at upper arm or shoulder -to remove and just do light one layer bandage  with isotoner glove, soft stockinet    Return in week for follow up        Assessment and plan DR [name] 08/30/21:  Patient is a 66 y.o. male with metastatic GE junction adenocarcinoma here for on treatment assessment prior to cycle 1 of FOLFIRI chemotherapy   Patient received 1 cycle of Enhertu treatment but will not be continued subsequently given that his HER2 testing came back negative.  He will be proceeding with FOLFIRI chemotherapy tomorrow with pump disconnect over the weekend.  I will see him back in 2 weeks for cycle 2 of FOLFIRI chemotherapy   We discussed that hisCancer has recurred in an aggressive fashion and CEA continues to trend up.  He has significant burden of disease as noted with metastatic pleural/lung nodules, hilar adenopathy as well as liver metastases.  We discussed considering foregoing chemotherapy and proceeding with best supportive care/hospice.  Patient still  has a good performance status at this time and would like to try second line chemotherapy at this time.  Patient and his daughter understand that his overall prognosis is poor despite treatments   Neoplasm related pain: Continue fentanyl patch and as needed oxycodone   Bilateral lower extremity swelling: Likely secondary to advanced malignancy as well as hypoalbuminemia     OT screen 08/29/21:    t arrive with  compression off for about 5hrs - had this am his 7th radiation session Daughter with pt and did change his bandages yesterday- and another CLT did Monday.  Circumference taken for L UE Measurements decrease in fL arm some -provided tubigrip D to use during radiation over isotoner glove -to maintain decongestion in hand and forearm.  Fibrosis of upper arm decreasing - decrease shoulder AROM  but elbow , wrist and hand AROM assess and WFL and review with pt this date Would not recommend at this time for pt to try and donn 20-23mHG over the counter compression sleeve -do not have the strength and will reinjured UE -and  compression sleeve do not decrease but maintain. Recommend 3 layer bandage to cont with - OT done bandaging again today  - son and daughter ed on doing at home -   OT will do today and son /Derek Jackwill repeat every day after radiation until -OT will check next week Done Eucerin lotion, Isotoner glove  and soft stockinet Rosidal foam from hand to upper arm  6 cm short stretch 3 x thru and over hand to forearm figure 8's, 8 cm short stretch wrist , hand , to elbow  figure 8's,  10 cm  mid forearm to  upper arm - stop below radiation area and circular   Reinforce many times with pt and son/daughter -if issues and increase pressure, swelling or pain at upper arm or shoulder -to remove and just do light one layer bandage  with isotoner glove, soft stockinet  Will check on getting pt into circ-aid for pt to do at home for more comfort and maintenance of L UE lypmphedema     Return next week  SCREEN 09/04/21:    t arrive with  compression  bandages on R UE - son and wife with him today - son report he changed bandages since Sunday and yesterday after last readiation session and arm looked so much smaller since Sunday.   Circumference taken for L UE Measurements  - decrease greatly compare to last week - but still increase 2 cm in most areas compare to L UE - upper arm around top of shoulder swollen - pt ed on doing MLD again up there over top of shoulder some during day  Review with pt to do some pendulum for shoulder pain free  And shoulder  AAROM gentle in supine for flexion - cont AROM elbow , wrist and hand   Would not recommend at this time for pt to try and donn 20-93mHG over the counter compression sleeve -do not have the strength and will reinjured UE -and  compression sleeve do not decrease but maintain. Done Eucerin lotion, Isotoner glove  and soft stockinet Rosidal foam from hand to upper arm  6 cm short stretch 3 x thru and over hand to forearm figure 8's, 8 cm short stretch wrist , hand , to elbow  figure 8's, 10 cm  mid forearm to  upper arm - stop below radiation area and circular Pt to keep on for 48 hrs and will send pt for measurements for Circ-aid measurements on Thursday [facility] to wear during day and night time -and adjust himself                                                                       Patient will benefit from skilled therapeutic intervention in order to improve the following deficits and impairments:           Visit Diagnosis: Lymphedema, not elsewhere classified    Problem List Patient Active Problem List   Diagnosis Date Noted   Lymphedema 07/20/2021   Swelling of arm 07/17/2021   Great toe pain, left 02/06/2021   Advance directive discussed with patient 02/06/2021   Genetic testing 09/07/2020   Family history of colon cancer     Family history of brain cancer    Family history of stomach cancer    Family history of melanoma    Goals of care, counseling/discussion 06/23/2020   Esophageal adenocarcinoma ([place] 06/23/2020   Esophageal dysphagia 05/26/2020   Erectile dysfunction 09/20/2013   Routine general medical examination at a health care facility 09/13/2011   Psoriasis 09/13/2011   ALLERGIC RHINITIS 04/28/2009   GERD 04/28/2009   COLONIC POLYPS, HX OF 04/28/2009    DRosalyn Gess OTR/L,CLT 09/04/2021, 1:03 PM  [facility] [address], [place], [address] Phone: [phone]   Fax:  [phone]  Name: Cory [name] MRN: [id number] Date of Birth: Apr 08, 1956

## 2021-09-06 ENCOUNTER — Other Ambulatory Visit: Payer: Self-pay

## 2021-09-06 ENCOUNTER — Ambulatory Visit: Payer: Medicare Other | Attending: Internal Medicine | Admitting: Occupational Therapy

## 2021-09-06 DIAGNOSIS — I890 Lymphedema, not elsewhere classified: Secondary | ICD-10-CM | POA: Insufficient documentation

## 2021-09-06 NOTE — Therapy (Signed)
[facility] 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   Family history of brain cancer    Family history of colon cancer    Family history of melanoma    Family history of stomach cancer    GERD (gastroesophageal reflux disease)    History of kidney stones    Nephrolithiasis    [facility]   Pancreatitis, acute    Personal history of colonic polyps    Dr Nicolasa Ducking   Psoriasis     Past Surgical History:  Procedure Laterality Date   COLONOSCOPY     COLONOSCOPY WITH PROPOFOL N/A 03/31/2020   Procedure: COLONOSCOPY WITH PROPOFOL;  Surgeon: Jonathon Bellows, MD;  Location: [facility] ENDOSCOPY;  Service: Gastroenterology;  Laterality: N/A;   ERCP     ESOPHAGOGASTRODUODENOSCOPY (EGD) WITH PROPOFOL N/A 06/19/2020   Procedure: ESOPHAGOGASTRODUODENOSCOPY (EGD) WITH PROPOFOL;  Surgeon: Jonathon Bellows, MD;  Location: [facility] ENDOSCOPY;  Service: Gastroenterology;  Laterality: N/A;   groin surgery     as a child   PORTA CATH INSERTION N/A 06/28/2020   Procedure: PORTA CATH INSERTION;  Surgeon: Algernon Huxley, MD;  Location: [facility];  Service: Cardiovascular;  Laterality: N/A;   SHOULDER SURGERY     UPPER EXTREMITY VENOGRAPHY Right 07/24/2021   Procedure: UPPER EXTREMITY VENOGRAPHY;  Surgeon: Katha Cabal, MD;  Location: [facility];  Service: Cardiovascular;  Laterality: Right;    There were no vitals filed for this visit.   Subjective Assessment - 09/06/21 1107     Subjective  I kept you bandages on - I think my arm is going to even look better than last time -     Currently in Pain? No/denies                 LYMPHEDEMA/ONCOLOGY QUESTIONNAIRE - 09/06/21 0001       Right Upper Extremity Lymphedema   15 cm Proximal to Olecranon Process 34 cm    10 cm Proximal to Olecranon Process 30 cm    Olecranon Process 28 cm    15 cm Proximal to Ulnar Styloid Process 25 cm    10 cm Proximal to Ulnar Styloid Process 21.4 cm    Just Proximal to Ulnar Styloid Process 18 cm    Across Hand at [employer] 22.2 cm      Left Upper Extremity Lymphedema   15 cm Proximal to Olecranon Process 30.8 cm    10 cm Proximal to Olecranon Process 28.4 cm    Olecranon Process 27.4 cm    15 cm Proximal to Ulnar Styloid Process 25.5 cm    10 cm Proximal to Ulnar Styloid Process 22 cm    Just Proximal to Ulnar Styloid Process 22 cm             OT SCREEN 08/08/21:   Circumference taken for bilateral UE - R dominant UE increase by 6.5 cm upper arm, elbow 5 cm , forearm 6 cm , wrist 4 cm and hand 3 cm compare to L.  Fibrosis  of upper arm and hard - decrease shoulder AROM , elbow flexion end range limited by swelling but sup/pro, wrist and hand WFL Would not recommend at this time for pt to try and donn 20-3mHG over the counter compression sleeve -do not have the strength and will reinjured UE -and  compression sleeve do not decrease but maintain. Done Isotoner glove with pt and Tubigrip D hand to elbow and tubigrip F from wrist to upper arm -about 944mg and had trouble to donn that - daughter is at home with him until Friday to assist. Did recommend for them to do one layer bandage for day or 2 - with isotoner glove, soft stockinet and 10 cm short stretch hand and wrist 2 x  wrap and over lap 50% to upper arm. And then try again tubigip D and F with isotoner glove - wear light compression most of the time And ed on soft MLD on upper  arm over top of shoulder -and daughter ed on light fibrosis tech on upper arm prior to MLD   Return in week for follow up     OT  SCREEN 08/15/21:   Pt arrive with soft stockinett- isotoner glove and one layer 10cm short stretch to mid upper arm. He report keeping bandages off since seen vascular MD at night time - but feels like swelling is less with compression but increase if not wearing compression Hard time doing bandage himself. Starting radiation to humerus and axillary ln on R Tuesday. Circumference taken for  L UE - increase from hand to upper arm   Fibrosis of upper arm and hard - decrease shoulder AROM , elbow flexion end range limited by swelling but sup/pro, wrist and hand WFL Would not recommend at this time for pt to try and donn 20-3021m over the counter compression sleeve -do not have the strength and will reinjured UE -and  compression sleeve do not decrease but maintain. Recommend 3 layer bandage at this time - OT will do today and son will redo it Fri and Sunday and Tuesday - OT will check on him Wednesday Done Eucerin lotion, Isotoner glove  and soft stockinet Artiflex 10 cm wrist, hand , wrist to forearm , 15 cm Artiflex wrist, hand and then back up to axilla  8 cm short stretch wrist , hand , wrist to mid forearm , 10 cm wrist , hand , wrist and up circular 50% over lay to axilla   Reinforce many times with pt and son -if issues and increase pressure, swelling or pain at upper arm or shoulder -to remove and just do light one layer bandage  with isotoner glove, soft stockinet  Review again and can cont to do light  MLD on upper  arm over top of shoulder   Return in week for follow up   OT SCREEN 08/22/21: Pt arrive with  compression off for about 2-3 hrs - had this am his 2nd radiation session Son with pt and did change his bandages every 48 hrs and then daily since radiation started  and will continues between him and sister doing it  Circumference taken for L UE Measurements decrease in upper arm, wrist and hand  Fibrosis of upper arm decreasing - decrease shoulder AROM , elbow flexion end range  limited by swelling but sup/pro, wrist and hand WFL Would not recommend at this time for pt to try and donn 20-73m74mover the counter compression sleeve -do not have the strength and will reinjured UE -  and  compression sleeve do not decrease but maintain. Recommend 3 layer bandage to cont with - OT done bandaging again today and son recorded it and provided him hand out too  OT will do today and son Derek Jack will repeat every day after radiation until -OT will check on him Wednesday Done Eucerin lotion, Isotoner glove  and soft stockinet Artiflex 10 cm wrist, hand , wrist to forearm , 15 cm Artiflex wrist, hand and then back up to axilla  6 cm short stretch 3 x thru and over hand to forearm figure 8's, 8 cm short stretch wrist , hand , to elbow  figure 8's, 10 cm  mid forearm to  upper arm - stop below radiation area and circular   Reinforce many times with pt and son -if issues and increase pressure, swelling or pain at upper arm or shoulder -to remove and just do light one layer bandage  with isotoner glove, soft stockinet    Return in week for follow up        Assessment and plan DR [name] 08/30/21:  Patient is a 66 y.o. male with metastatic GE junction adenocarcinoma here for on treatment assessment prior to cycle 1 of FOLFIRI chemotherapy   Patient received 1 cycle of Enhertu treatment but will not be continued subsequently given that his HER2 testing came back negative.  He will be proceeding with FOLFIRI chemotherapy tomorrow with pump disconnect over the weekend.  I will see him back in 2 weeks for cycle 2 of FOLFIRI chemotherapy   We discussed that hisCancer has recurred in an aggressive fashion and CEA continues to trend up.  He has significant burden of disease as noted with metastatic pleural/lung nodules, hilar adenopathy as well as liver metastases.  We discussed considering foregoing chemotherapy and proceeding with best supportive care/hospice.  Patient still has a good performance  status at this time and would like to try second line chemotherapy at this time.  Patient and his daughter understand that his overall prognosis is poor despite treatments   Neoplasm related pain: Continue fentanyl patch and as needed oxycodone   Bilateral lower extremity swelling: Likely secondary to advanced malignancy as well as hypoalbuminemia     OT screen 08/29/21:    t arrive with  compression off for about 5hrs - had this am his 7th radiation session Daughter with pt and did change his bandages yesterday- and another CLT did Monday.  Circumference taken for L UE Measurements decrease in fL arm some -provided tubigrip D to use during radiation over isotoner glove -to maintain decongestion in hand and forearm.  Fibrosis of upper arm decreasing - decrease shoulder AROM  but elbow , wrist and hand AROM assess and WFL and review with pt this date Would not recommend at this time for pt to try and donn 20-86mHG over the counter compression sleeve -do not have the strength and will reinjured UE -and  compression sleeve do not decrease but maintain. Recommend 3 layer bandage to cont with - OT done bandaging again today  - son and daughter ed on doing at home -   OT will do today and son /Derek Jackwill repeat every day after radiation until -OT will check next week Done Eucerin lotion, Isotoner glove  and soft stockinet Rosidal foam from hand to upper arm  6 cm short stretch 3 x thru and over hand to forearm figure 8's, 8 cm short stretch wrist , hand , to elbow  figure 8's, 10  cm  mid forearm to  upper arm - stop below radiation area and circular   Reinforce many times with pt and son/daughter -if issues and increase pressure, swelling or pain at upper arm or shoulder -to remove and just do light one layer bandage  with isotoner glove, soft stockinet  Will check on getting pt into circ-aid for pt to do at home for more comfort and maintenance of L UE lypmphedema    Return next week   SCREEN  09/04/21:     t arrive with  compression  bandages on R UE - son and wife with him today - son report he changed bandages since Sunday and yesterday after last readiation session and arm looked so much smaller since Sunday.    Circumference taken for L UE Measurements  - decrease greatly compare to last week - but still increase 2 cm in most areas compare to L UE - upper arm around top of shoulder swollen - pt ed on doing MLD again up there over top of shoulder some during day  Review with pt to do some pendulum for shoulder pain free  And shoulder  AAROM gentle in supine for flexion - cont AROM elbow , wrist and hand    Would not recommend at this time for pt to try and donn 20-91mHG over the counter compression sleeve -do not have the strength and will reinjured UE -and  compression sleeve do not decrease but maintain. Done Eucerin lotion, Isotoner glove  and soft stockinet Rosidal foam from hand to upper arm  6 cm short stretch 3 x thru and over hand to forearm figure 8's, 8 cm short stretch wrist , hand , to elbow  figure 8's, 10 cm  mid forearm to  upper arm - stop below radiation area and circular Pt to keep on for 48 hrs and will send pt for measurements for Circ-aid measurements on Thursday [facility] will be able to wear during day and night time -and adjust himself     OT SCREEN 09/06/21:  Pt arrive with  compression  bandages on R UE - son with him today - kept bandages on for 48 hrs    Circumference taken for R UE Measurements  - decrease greatly  again compare to  2 days ago and last week - upper arm around top of shoulder swollen  but less than 2 days ago - done some fibrotic techniques - son to help with prior to MLD again up there over top of shoulder some during day  Review with pt to do some pendulum for shoulder pain free  And shoulder  AAROM gentle in supine for flexion - cont AROM elbow , wrist and hand    Would not recommend at this time for pt to try  and donn 20-3100mG over the counter compression sleeve -do not have the strength and will reinjured UE -and  compression sleeve do not decrease but maintain. Son to replace bandage in hour to 2 after getting measured for circ-aid.- Leaving now to get measured Eucerin lotion, Isotoner glove  and soft stockinet Rosidal foam from hand to upper arm  6 cm short stretch 3 x thru and over hand to forearm figure 8's, 8 cm short stretch wrist , hand , to elbow  figure 8's, 10 cm  mid forearm to  upper arm - stop below radiation area and circular Pt leaving to get measured for Circ-aid  at  [facility]    Circ-aid pt will be able to wear during day and night time -and adjust himself   Pt to follow up with me on Monday                                            Patient will benefit from skilled therapeutic intervention in order to improve the following deficits and impairments:           Visit Diagnosis: Lymphedema, not elsewhere classified    Problem List Patient Active Problem List   Diagnosis Date Noted   Lymphedema 07/20/2021   Swelling of arm 07/17/2021   Great toe pain, left 02/06/2021   Advance directive discussed with patient 02/06/2021   Genetic testing 09/07/2020   Family history of colon cancer    Family history of brain cancer    Family history of stomach cancer    Family history of melanoma    Goals of care, counseling/discussion 06/23/2020   Esophageal adenocarcinoma ([place]) 06/23/2020   Esophageal dysphagia 05/26/2020   Erectile dysfunction 09/20/2013   Routine general medical examination at a health care facility 09/13/2011   Psoriasis 09/13/2011   ALLERGIC RHINITIS 04/28/2009   GERD 04/28/2009   COLONIC POLYPS, HX OF 04/28/2009    Rosalyn Gess, OTR/L,CLT 09/06/2021, 11:10 AM  [facility] [address], [place], [address] Phone: [phone]   Fax:   [phone]  Name: Cory [name] MRN: [id number] Date of Birth: 12/23/1955

## 2021-09-10 ENCOUNTER — Ambulatory Visit: Payer: Medicare Other | Admitting: Occupational Therapy

## 2021-09-10 ENCOUNTER — Other Ambulatory Visit: Payer: Self-pay

## 2021-09-10 ENCOUNTER — Telehealth: Payer: Self-pay

## 2021-09-10 DIAGNOSIS — I890 Lymphedema, not elsewhere classified: Secondary | ICD-10-CM

## 2021-09-10 NOTE — Therapy (Signed)
[facility] 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 ENDOSCOPY;  Service: Gastroenterology;  Laterality: N/A;   ERCP     ESOPHAGOGASTRODUODENOSCOPY (EGD) WITH PROPOFOL N/A 06/19/2020   Procedure: ESOPHAGOGASTRODUODENOSCOPY (EGD) WITH PROPOFOL;  Surgeon: Jonathon Bellows, MD;  Location: [facility] ENDOSCOPY;  Service: Gastroenterology;  Laterality: N/A;   groin surgery     as a child   PORTA CATH INSERTION N/A 06/28/2020   Procedure: PORTA CATH INSERTION;  Surgeon: Algernon Huxley, MD;  Location: [facility];  Service: Cardiovascular;  Laterality: N/A;   SHOULDER SURGERY     UPPER EXTREMITY VENOGRAPHY Right 07/24/2021   Procedure: UPPER EXTREMITY VENOGRAPHY;  Surgeon: Katha Cabal, MD;  Location: [place] [facility];  Service: Cardiovascular;  Laterality: Right;    There were no vitals filed for this visit.   Subjective Assessment - 09/10/21 1859     Subjective  My son change my bandages Friday and then Sunday - but my skin was like this yesterday - I   put some of that radiation lotion on Friday and it was itching - then yesterday did not put anything on- did you hear back about the compression garments    Currently in Pain? No/denies                           OT SCREEN 08/08/21:   Circumference taken for bilateral UE - R dominant UE increase by 6.5 cm upper arm, elbow 5 cm , forearm 6 cm , wrist 4 cm and hand 3 cm compare to L.  Fibrosis of upper arm and hard - decrease shoulder AROM , elbow flexion end range limited by swelling but sup/pro, wrist and hand WFL Would not recommend at this time for pt to try and donn 20-72mHG over the counter compression sleeve -do not have the strength and will reinjured UE -and  compression sleeve do not decrease but maintain. Done Isotoner glove with pt and Tubigrip D hand to elbow and tubigrip F from wrist to upper arm -about 923mg and had trouble to donn that - daughter is at home with him until Friday to assist. Did recommend for them to do one layer bandage for day or 2 - with isotoner glove, soft stockinet and 10 cm short stretch hand and wrist 2 x  wrap and over lap 50% to upper arm.  And then try again tubigip D and F with isotoner glove - wear light compression most of the time And ed on soft MLD on upper  arm over top of shoulder -and daughter ed on light fibrosis tech on upper arm prior to MLD   Return in week for follow up     OT SCREEN 08/15/21:   Pt arrive with soft stockinett- isotoner glove and one layer 10cm short stretch to mid upper arm. He report keeping bandages off since seen vascular MD at night time - but feels like swelling is less with compression but increase if not wearing compression Hard time doing bandage himself. Starting radiation to humerus and axillary ln on R Tuesday. Circumference taken for  L UE - increase from hand to upper arm   Fibrosis of upper arm and hard - decrease shoulder AROM , elbow flexion end range limited by swelling but sup/pro, wrist and  hand WFL Would not recommend at this time for pt to try and donn 20-55mHG over the counter compression sleeve -do not have the strength and will reinjured UE -and  compression sleeve do not decrease but maintain. Recommend 3 layer bandage at this time - OT will do today and son will redo it Fri and Sunday and Tuesday - OT will check on him Wednesday Done Eucerin lotion, Isotoner glove  and soft stockinet Artiflex 10 cm wrist, hand , wrist to forearm , 15 cm Artiflex wrist, hand and then back up to axilla  8 cm short stretch wrist , hand , wrist to mid forearm , 10 cm wrist , hand , wrist and up circular 50% over lay to axilla   Reinforce many times with pt and son -if issues and increase pressure, swelling or pain at upper arm or shoulder -to remove and just do light one layer bandage  with isotoner glove, soft stockinet  Review again and can cont to do light  MLD on upper  arm over top of shoulder   Return in week for follow up   OT SCREEN 08/22/21: Pt arrive with  compression off for about 2-3 hrs - had this am his 2nd radiation session Son with pt and did change his bandages every 48 hrs and then daily since radiation started  and will continues between him and sister doing it  Circumference taken for L UE Measurements decrease in upper arm, wrist and hand  Fibrosis of upper arm decreasing - decrease shoulder AROM , elbow flexion end range limited by swelling but sup/pro, wrist and hand WFL Would not recommend at this time for pt to try and donn 20-354mG over the counter compression sleeve -do not have the strength and will reinjured UE -and  compression sleeve do not decrease but maintain. Recommend 3 layer bandage to cont with - OT done bandaging again today and son recorded it and provided him hand out too  OT will do today and son /dDerek Jackill repeat every day after radiation until -OT will check on him Wednesday Done Eucerin lotion, Isotoner glove  and soft stockinet Artiflex 10 cm  wrist, hand , wrist to forearm , 15 cm Artiflex wrist, hand and then back up to axilla  6 cm short stretch 3 x thru and over hand to forearm figure 8's, 8 cm short stretch wrist , hand , to elbow  figure 8's, 10 cm  mid forearm to  upper arm - stop below radiation area and circular   Reinforce many times with pt and  son -if issues and increase pressure, swelling or pain at upper arm or shoulder -to remove and just do light one layer bandage  with isotoner glove, soft stockinet    Return in week for follow up        Assessment and plan DR [name] 08/30/21:  Patient is a 66 y.o. male with metastatic GE junction adenocarcinoma here for on treatment assessment prior to cycle 1 of FOLFIRI chemotherapy   Patient received 1 cycle of Enhertu treatment but will not be continued subsequently given that his HER2 testing came back negative.  He will be proceeding with FOLFIRI chemotherapy tomorrow with pump disconnect over the weekend.  I will see him back in 2 weeks for cycle 2 of FOLFIRI chemotherapy   We discussed that hisCancer has recurred in an aggressive fashion and CEA continues to trend up.  He has significant burden of disease as noted with metastatic pleural/lung nodules, hilar adenopathy as well as liver metastases.  We discussed considering foregoing chemotherapy and proceeding with best supportive care/hospice.  Patient still has a good performance status at this time and would like to try second line chemotherapy at this time.  Patient and his daughter understand that his overall prognosis is poor despite treatments   Neoplasm related pain: Continue fentanyl patch and as needed oxycodone   Bilateral lower extremity swelling: Likely secondary to advanced malignancy as well as hypoalbuminemia     OT screen 08/29/21:    t arrive with  compression off for about 5hrs - had this am his 7th radiation session Daughter with pt and did change his bandages yesterday- and another CLT did Monday.   Circumference taken for L UE Measurements decrease in fL arm some -provided tubigrip D to use during radiation over isotoner glove -to maintain decongestion in hand and forearm.  Fibrosis of upper arm decreasing - decrease shoulder AROM  but elbow , wrist and hand AROM assess and WFL and review with pt this date Would not recommend at this time for pt to try and donn 20-9mHG over the counter compression sleeve -do not have the strength and will reinjured UE -and  compression sleeve do not decrease but maintain. Recommend 3 layer bandage to cont with - OT done bandaging again today  - son and daughter ed on doing at home -   OT will do today and son /Derek Jackwill repeat every day after radiation until -OT will check next week Done Eucerin lotion, Isotoner glove  and soft stockinet Rosidal foam from hand to upper arm  6 cm short stretch 3 x thru and over hand to forearm figure 8's, 8 cm short stretch wrist , hand , to elbow  figure 8's, 10 cm  mid forearm to  upper arm - stop below radiation area and circular   Reinforce many times with pt and son/daughter -if issues and increase pressure, swelling or pain at upper arm or shoulder -to remove and just do light one layer bandage  with isotoner glove, soft stockinet  Will check on getting pt into circ-aid for pt to do at home for more comfort and maintenance of L UE lypmphedema    Return next week   SCREEN 09/04/21:     t arrive with  compression  bandages on R UE - son and wife with him today - son report he changed bandages since Sunday and yesterday after last readiation session and arm looked so much smaller since Sunday.    Circumference taken for L  UE Measurements  - decrease greatly compare to last week - but still increase 2 cm in most areas compare to L UE - upper arm around top of shoulder swollen - pt ed on doing MLD again up there over top of shoulder some during day  Review with pt to do some pendulum for shoulder pain free  And  shoulder  AAROM gentle in supine for flexion - cont AROM elbow , wrist and hand    Would not recommend at this time for pt to try and donn 20-45mHG over the counter compression sleeve -do not have the strength and will reinjured UE -and  compression sleeve do not decrease but maintain. Done Eucerin lotion, Isotoner glove  and soft stockinet Rosidal foam from hand to upper arm  6 cm short stretch 3 x thru and over hand to forearm figure 8's, 8 cm short stretch wrist , hand , to elbow  figure 8's, 10 cm  mid forearm to  upper arm - stop below radiation area and circular Pt to keep on for 48 hrs and will send pt for measurements for Circ-aid measurements on Thursday [facility] during day and night time -and adjust himself       OT SCREEN 09/06/21:  Pt arrive with  compression  bandages on R UE - son with him today - kept bandages on for 48 hrs    Circumference taken for R UE Measurements  - decrease greatly  again compare to  2 days ago and last week - upper arm around top of shoulder swollen  but less than 2 days ago - done some fibrotic techniques - son to help with prior to MLD again up there over top of shoulder some during day  Review with pt to do some pendulum for shoulder pain free  And shoulder  AAROM gentle in supine for flexion - cont AROM elbow , wrist and hand    Would not recommend at this time for pt to try and donn 20-340mG over the counter compression sleeve -do not have the strength and will reinjured UE -and  compression sleeve do not decrease but maintain. Son to replace bandage in hour to 2 after getting measured for circ-aid.- Leaving now to get measured Eucerin lotion, Isotoner glove  and soft stockinet Rosidal foam from hand to upper arm  6 cm short stretch 3 x thru and over hand to forearm figure 8's, 8 cm short stretch wrist , hand , to elbow  figure 8's, 10 cm  mid forearm to  upper arm - stop below radiation area and  circular Pt leaving to get measured for Circ-aid  at [facility]:  Pt arrive with  compression  bandages on R UE - D-I-L  with pt Bandages removed-patient's arm with several red pimples of which 3 of them had puss Skin irritated-patient report arm itchy Son changed bandages last Thursday and Friday-daughter changed yesterday when noticing redness and irritation Applied since last time radiation lotion on arm-did not apply any lotion yesterday This OT contacted symptoms management at cancer center-was unable to get appointment today-patient was unable to do telehealth Patient has appointment 830 tomorrow morning with symptoms management After patient washed arm applied Neosporin on any open areas or pimples-and Cortaid lotion for itching Symptoms management nurse educated patient on symptoms and signs that were causing over the night to go to the ED Patient denies  fever,pain    Await circ-aid compression garments that should be in this week per Rep from [facility] glove  and soft stockinet donn Rosidal foam from hand to upper arm circular 50% overlay 6 cm short stretch 3 x thru and over hand to forearm figure 8's, 8 cm short stretch wrist , hand , to elbow  figure 8's, 10 cm  mid forearm to  upper arm - stop below radiation area in upper arm Pt to keep arm out to side on pillow to air his axilla   Circ-aid pt will be able to wear during day and night time -and adjust himself   Pt to follow up with me later in week  for ed and fitting when Circ-aids come in                                      Patient will benefit from skilled therapeutic intervention in order to improve the following deficits and impairments:           Visit Diagnosis: Lymphedema, not elsewhere classified    Problem List Patient Active Problem List   Diagnosis Date Noted   Lymphedema 07/20/2021   Swelling of arm 07/17/2021   Great toe pain, left  02/06/2021   Advance directive discussed with patient 02/06/2021   Genetic testing 09/07/2020   Family history of colon cancer    Family history of brain cancer    Family history of stomach cancer    Family history of melanoma    Goals of care, counseling/discussion 06/23/2020   Esophageal adenocarcinoma ([place]) 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, [place], [address] Phone: [phone]   Fax:  [phone]  Name: Cory [name] MRN: [id number] Date of Birth: April 12, 1956

## 2021-09-10 NOTE — Telephone Encounter (Signed)
Received message from OT re: "pimples on left arm under bandage". OT requesting pt be seen in clinic. OT has been informed that first available appointment is tomorrow AM, or pt would need to go to ED. Pt is with OT at time of this conversation. Pt is agreeable to see [facility] in the AM. Informed of s/sx that would necessitate ER visit, such as development of fever or spreading of redness/pustules. Pt verbalized understanding.  ?

## 2021-09-11 ENCOUNTER — Other Ambulatory Visit: Payer: Self-pay

## 2021-09-11 ENCOUNTER — Ambulatory Visit: Payer: Medicare Other

## 2021-09-11 ENCOUNTER — Inpatient Hospital Stay (HOSPITAL_BASED_OUTPATIENT_CLINIC_OR_DEPARTMENT_OTHER): Payer: Medicare Other | Admitting: Hospice and Palliative Medicine

## 2021-09-11 ENCOUNTER — Inpatient Hospital Stay: Payer: Medicare Other | Attending: Oncology

## 2021-09-11 VITALS — BP 116/56 | HR 69 | Temp 98.8°F | Resp 16 | Wt 182.0 lb

## 2021-09-11 DIAGNOSIS — Z800 Family history of malignant neoplasm of digestive organs: Secondary | ICD-10-CM | POA: Diagnosis not present

## 2021-09-11 DIAGNOSIS — L739 Follicular disorder, unspecified: Secondary | ICD-10-CM | POA: Diagnosis not present

## 2021-09-11 DIAGNOSIS — R21 Rash and other nonspecific skin eruption: Secondary | ICD-10-CM | POA: Insufficient documentation

## 2021-09-11 DIAGNOSIS — I499 Cardiac arrhythmia, unspecified: Secondary | ICD-10-CM | POA: Diagnosis not present

## 2021-09-11 DIAGNOSIS — R5383 Other fatigue: Secondary | ICD-10-CM | POA: Diagnosis not present

## 2021-09-11 DIAGNOSIS — Z808 Family history of malignant neoplasm of other organs or systems: Secondary | ICD-10-CM | POA: Diagnosis not present

## 2021-09-11 DIAGNOSIS — G893 Neoplasm related pain (acute) (chronic): Secondary | ICD-10-CM | POA: Insufficient documentation

## 2021-09-11 DIAGNOSIS — Z8249 Family history of ischemic heart disease and other diseases of the circulatory system: Secondary | ICD-10-CM | POA: Insufficient documentation

## 2021-09-11 DIAGNOSIS — Z8042 Family history of malignant neoplasm of prostate: Secondary | ICD-10-CM | POA: Diagnosis not present

## 2021-09-11 DIAGNOSIS — C155 Malignant neoplasm of lower third of esophagus: Secondary | ICD-10-CM | POA: Insufficient documentation

## 2021-09-11 DIAGNOSIS — Z79899 Other long term (current) drug therapy: Secondary | ICD-10-CM | POA: Diagnosis not present

## 2021-09-11 DIAGNOSIS — Z87891 Personal history of nicotine dependence: Secondary | ICD-10-CM | POA: Insufficient documentation

## 2021-09-11 DIAGNOSIS — Z5111 Encounter for antineoplastic chemotherapy: Secondary | ICD-10-CM | POA: Diagnosis present

## 2021-09-11 DIAGNOSIS — Z87442 Personal history of urinary calculi: Secondary | ICD-10-CM | POA: Diagnosis not present

## 2021-09-11 DIAGNOSIS — Z8049 Family history of malignant neoplasm of other genital organs: Secondary | ICD-10-CM | POA: Diagnosis not present

## 2021-09-11 DIAGNOSIS — Z833 Family history of diabetes mellitus: Secondary | ICD-10-CM | POA: Diagnosis not present

## 2021-09-11 DIAGNOSIS — Z86010 Personal history of colonic polyps: Secondary | ICD-10-CM | POA: Insufficient documentation

## 2021-09-11 DIAGNOSIS — K1230 Oral mucositis (ulcerative), unspecified: Secondary | ICD-10-CM | POA: Insufficient documentation

## 2021-09-11 DIAGNOSIS — C159 Malignant neoplasm of esophagus, unspecified: Secondary | ICD-10-CM

## 2021-09-11 DIAGNOSIS — I890 Lymphedema, not elsewhere classified: Secondary | ICD-10-CM

## 2021-09-11 DIAGNOSIS — C787 Secondary malignant neoplasm of liver and intrahepatic bile duct: Secondary | ICD-10-CM | POA: Insufficient documentation

## 2021-09-11 DIAGNOSIS — K219 Gastro-esophageal reflux disease without esophagitis: Secondary | ICD-10-CM | POA: Insufficient documentation

## 2021-09-11 DIAGNOSIS — Z5189 Encounter for other specified aftercare: Secondary | ICD-10-CM | POA: Diagnosis not present

## 2021-09-11 DIAGNOSIS — Z5112 Encounter for antineoplastic immunotherapy: Secondary | ICD-10-CM | POA: Diagnosis present

## 2021-09-11 LAB — COMPREHENSIVE METABOLIC PANEL
ALT: 19 U/L (ref 0–44)
AST: 30 U/L (ref 15–41)
Albumin: 3.6 g/dL (ref 3.5–5.0)
Alkaline Phosphatase: 376 U/L — ABNORMAL HIGH (ref 38–126)
Anion gap: 6 (ref 5–15)
BUN: 5 mg/dL — ABNORMAL LOW (ref 8–23)
CO2: 28 mmol/L (ref 22–32)
Calcium: 9.2 mg/dL (ref 8.9–10.3)
Chloride: 105 mmol/L (ref 98–111)
Creatinine, Ser: 0.65 mg/dL (ref 0.61–1.24)
GFR, Estimated: >60 mL/min (ref >60)
Glucose, Bld: 108 mg/dL — ABNORMAL HIGH (ref 70–99)
Potassium: 3.5 mmol/L (ref 3.5–5.1)
Sodium: 139 mmol/L (ref 135–145)
Total Bilirubin: 1.1 mg/dL (ref 0.3–1.2)
Total Protein: 6.7 g/dL (ref 6.5–8.1)

## 2021-09-11 LAB — CBC WITH DIFFERENTIAL/PLATELET
Abs Immature Granulocytes: 0.05 10*3/uL (ref 0.00–0.07)
Basophils Absolute: 0 10*3/uL (ref 0.0–0.1)
Basophils Relative: 1 %
Eosinophils Absolute: 0.1 10*3/uL (ref 0.0–0.5)
Eosinophils Relative: 2 %
HCT: 36.1 % — ABNORMAL LOW (ref 39.0–52.0)
Hemoglobin: 11.3 g/dL — ABNORMAL LOW (ref 13.0–17.0)
Immature Granulocytes: 1 %
Lymphocytes Relative: 17 %
Lymphs Abs: 0.9 10*3/uL (ref 0.7–4.0)
MCH: 29.7 pg (ref 26.0–34.0)
MCHC: 31.3 g/dL (ref 30.0–36.0)
MCV: 95 fL (ref 80.0–100.0)
Monocytes Absolute: 0.4 10*3/uL (ref 0.1–1.0)
Monocytes Relative: 7 %
Neutro Abs: 3.9 10*3/uL (ref 1.7–7.7)
Neutrophils Relative %: 72 %
Platelets: 149 10*3/uL — ABNORMAL LOW (ref 150–400)
RBC: 3.8 MIL/uL — ABNORMAL LOW (ref 4.22–5.81)
RDW: 18.8 % — ABNORMAL HIGH (ref 11.5–15.5)
WBC: 5.4 10*3/uL (ref 4.0–10.5)
nRBC: 0 % (ref 0.0–0.2)

## 2021-09-11 MED ORDER — TRIAMCINOLONE ACETONIDE 0.5 % EX OINT: 1.0000 "application " | TOPICAL_OINTMENT | Freq: Two times a day (BID) | CUTANEOUS | 0 refills | Status: DC

## 2021-09-11 NOTE — Progress Notes (Signed)
Pt in clinic this AM per request of OT, for assessment of pustules to right arm. Pt states that the arm has been itching, and recently broke out in pustules along the distal forearm. One pustule is also noted to upper arm. Yesterday in outpatient rehab, he states that neosporin was applied to the pustules and cortisone cream was applied to reddened areas prior to wrapping.  ?

## 2021-09-11 NOTE — Therapy (Signed)
[facility] ?[place], [place], [address] ?Phone: [phone]   Fax:  [phone] ? ?Occupational Therapy Screen ? ?Patient Details  ?Name: Cory [name] ?MRN: [id number] ?Date of Birth: 20-Aug-1955 ?No data recorded ? ?Encounter Date: 09/11/2021 ? ? OT End of Session - 09/11/21 1401   ? ? Visit Number 0   ? OT Start Time 1607   ? OT Stop Time 0930   ? OT Time Calculation (min) 10 min   ? Activity Tolerance Patient tolerated treatment well   ? Behavior During Therapy [facility] for tasks assessed/performed   ? ?  ?  ? ?  ? ? ?Past Medical History:  ?Diagnosis Date  ? 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 Family history of brain cancer   ? Family history of colon cancer   ? Family history of melanoma   ? Family history of stomach cancer   ? GERD (gastroesophageal reflux disease)   ? History of kidney stones   ? Nephrolithiasis   ? [facility]  ? Pancreatitis, acute   ? Personal history of colonic polyps   ? Dr Nicolasa Ducking  ? Psoriasis   ? ? ?Past Surgical History:  ?Procedure Laterality Date  ? COLONOSCOPY    ? COLONOSCOPY WITH PROPOFOL N/A 03/31/2020  ? Procedure: COLONOSCOPY WITH PROPOFOL;  Surgeon: Jonathon Bellows, MD;  Location: [facility] ENDOSCOPY;  Service: Gastroenterology;  Laterality: N/A;  ? ERCP    ? ESOPHAGOGASTRODUODENOSCOPY (EGD) WITH PROPOFOL N/A 06/19/2020  ? Procedure: ESOPHAGOGASTRODUODENOSCOPY (EGD) WITH PROPOFOL;  Surgeon: Jonathon Bellows, MD;  Location: [facility] ENDOSCOPY;  Service: Gastroenterology;  Laterality: N/A;  ? groin surgery    ? as a child  ? PORTA CATH INSERTION N/A 06/28/2020  ? Procedure: PORTA CATH INSERTION;  Surgeon: Algernon Huxley, MD;  Location: [place] [facility];  Service: Cardiovascular;  Laterality: N/A;  ? SHOULDER SURGERY    ? UPPER EXTREMITY VENOGRAPHY Right 07/24/2021  ? Procedure: UPPER EXTREMITY VENOGRAPHY;  Surgeon: Katha Cabal, MD;  Location: [place] [facility];  Service: Cardiovascular;  Laterality: Right;   ? ? ?There were no vitals filed for this visit. ? ?09/11/20 OT screen:  ?Pt seen today to assess appropriateness for compression wrapping to RUE d/t new skin irritation.  Collaborated with Vonna Kotyk, NP who felt rash was likely d/t moisture from lotion application post radiation tx followed by compression wrapping, and may need to hold off on wrapping this day to limit the skin irritation to RUE.  OT in agreement to allow skin to "breathe," and NP is prescribing new topical cream to tx the irritated skin.  OT encouraged pt that lymphedema appeared well managed at this time and as long as pt can continue to perform self MLD and AROM/AAROM exercises to promote lymph flow throughout RUE, anticipate that leaving compression wraps off until [facility] arrives should not contribute to much or any set back with lymph fluid increase.  Circaid garment was suppose to arrive yesterday but did not, so anticipate in the next day or so.  OT reviewed MLD techniques for upper/lower arm/hand with fair return demo.  OT reviewed self AAROM for R shoulder flex/abd, active elbow flex/ext, forearm pron/sup, wrist flex/ext, digit flex/ext with good return demo.  Instructed pt in table slides for R shoulder flex/abd to tolerance.  Encouraged pt complete MLD and ther ex 1-2x per day to promote lymph flow.  Pt has redness and skin irritation on R upper torso inferior to  axilla.  OT encouraged pt position self with R arm slightly abducted with pillow when able to reduce skin friction which can contribute to increased irritation.  Pt verbalized understanding of all education provided and verbalized understanding of how to contact OT for follow up when Circaid garment arrives.   ? ? ?Patient will benefit from skilled therapeutic intervention in order to improve the following deficits and impairments:   ?  ?  ?  ? ? ?Visit Diagnosis: ?Lymphedema, not elsewhere classified ? ? ? ?Problem List ?Patient Active Problem List  ? Diagnosis Date Noted   ? Lymphedema 07/20/2021  ? Swelling of arm 07/17/2021  ? Great toe pain, left 02/06/2021  ? Advance directive discussed with patient 02/06/2021  ? Genetic testing 09/07/2020  ? Family history of colon cancer   ? Family history of brain cancer   ? Family history of stomach cancer   ? Family history of melanoma   ? Goals of care, counseling/discussion 06/23/2020  ? Esophageal adenocarcinoma ([place]) 06/23/2020  ? Esophageal dysphagia 05/26/2020  ? Erectile dysfunction 09/20/2013  ? Routine general medical examination at a health care facility 09/13/2011  ? Psoriasis 09/13/2011  ? ALLERGIC RHINITIS 04/28/2009  ? GERD 04/28/2009  ? COLONIC POLYPS, HX OF 04/28/2009  ? ?Leta Speller, MS, OTR/L, CLT ? ?Darleene Cleaver, OT ?09/11/2021, 2:05 PM ? ?[facility] ?[place], [place], [address] ?Phone: [phone]   Fax:  [phone] ? ?Name: Cory [name] ?MRN: [id number] ?Date of Birth: 01/25/1956 ? ?

## 2021-09-11 NOTE — Progress Notes (Signed)
Symptom Management [place] [facility] Telephone:([phone]) [phone] [phone]  Patient Care Team: Venia Carbon, MD as PCP - General [name], RN as Oncology Nurse Navigator [name], MD as Consulting Physician (Hematology and Oncology)   Name of the patient: Cory [name]  [id number]  1956-04-23   Date of visit: 09/11/21  Reason for Consult: RAI SEVERNS is a 66 y.o. male with multiple medical problems including metastatic esophageal cancer with liver metastases on treatment with FOLFIRI chemotherapy.   Patient last saw Dr. [name] on 08/29/2021 at which time he was felt to be gradually declining with worsening bilateral lower extremity swelling and right upper extremity swelling.  Appetite has been poor.  Pain has been an issue but better controlled after increasing dose of fentanyl with use of as needed oxycodone.  Dr. [name] had discussed option of foregoing chemotherapy and proceeding with best supportive care/hospice but decision was made to try second line chemotherapy.  Patient was seen yesterday by OT for management of right upper extremity edema.  He is noted to have some pustules and was referred to [facility] for evaluation.  Patient reports that rash has been present over the past 3 days.  It itches.  He has been applying hydrocortisone cream which has been helping.  Denies any neurologic complaints. Denies recent fevers or illnesses. Denies any easy bleeding or bruising. Reports good appetite and denies weight loss. Denies chest pain. Denies any nausea, vomiting, constipation, or diarrhea. Denies urinary complaints. Patient offers no further specific complaints today.  PAST MEDICAL HISTORY: Past Medical History:  Diagnosis Date   Esophageal adenocarcinoma ([place]) 06/23/2020   Family history of brain cancer    Family history of colon cancer    Family history of melanoma    Family history of stomach cancer    GERD  (gastroesophageal reflux disease)    History of kidney stones    Nephrolithiasis    [facility]   Pancreatitis, acute    Personal history of colonic polyps    Dr Nicolasa Ducking   Psoriasis     PAST SURGICAL HISTORY:  Past Surgical History:  Procedure Laterality Date   COLONOSCOPY     COLONOSCOPY WITH PROPOFOL N/A 03/31/2020   Procedure: COLONOSCOPY WITH PROPOFOL;  Surgeon: Jonathon Bellows, MD;  Location: [facility] ENDOSCOPY;  Service: Gastroenterology;  Laterality: N/A;   ERCP     ESOPHAGOGASTRODUODENOSCOPY (EGD) WITH PROPOFOL N/A 06/19/2020   Procedure: ESOPHAGOGASTRODUODENOSCOPY (EGD) WITH PROPOFOL;  Surgeon: Jonathon Bellows, MD;  Location: [facility] ENDOSCOPY;  Service: Gastroenterology;  Laterality: N/A;   groin surgery     as a child   PORTA CATH INSERTION N/A 06/28/2020   Procedure: PORTA CATH INSERTION;  Surgeon: Algernon Huxley, MD;  Location: [place] CV LAB;  Service: Cardiovascular;  Laterality: N/A;   SHOULDER SURGERY     UPPER EXTREMITY VENOGRAPHY Right 07/24/2021   Procedure: UPPER EXTREMITY VENOGRAPHY;  Surgeon: Katha Cabal, MD;  Location: [place] CV LAB;  Service: Cardiovascular;  Laterality: Right;    HEMATOLOGY/ONCOLOGY HISTORY:  Oncology History  Esophageal adenocarcinoma ([place])  06/23/2020 Initial Diagnosis   Esophageal adenocarcinoma ([place])   06/28/2020 Cancer Staging   Staging form: Esophagus - Adenocarcinoma, AJCC 8th Edition - Clinical stage from 06/28/2020: Stage IVB (cTX, cN1, cM1) - Signed by [name], MD on 06/28/2020    07/03/2020 - 07/06/2021 Chemotherapy   Patient is on Treatment Plan : GASTROESOPHAGEAL FOLFOX/Kanjintiq14d x 6 cycles + Keytruda '400mg'$  q6w  02/13/2021 - 02/13/2021 Chemotherapy   Patient is on Treatment Plan : GE junction Trastuzumab q14d     08/06/2021 - 08/06/2021 Chemotherapy   Patient is on Treatment Plan : GASTROESOPHAGEAL fam-trastuzumab deruxtecan-nxki (Enhertu) q21d     08/30/2021 -  Chemotherapy   Patient is on Treatment Plan : GE  junction       ALLERGIES:  has No Known Allergies.  MEDICATIONS:  Current Outpatient Medications  Medication Sig Dispense Refill   dexamethasone (DECADRON) 4 MG tablet Take 1 tablet (4 mg total) by mouth 2 (two) times daily. 28 tablet 0   fentaNYL (DURAGESIC) 25 MCG/HR Place 1 patch onto the skin every 3 (three) days. 5 patch 0   furosemide (LASIX) 20 MG tablet Take 1 tablet (20 mg total) by mouth daily. 10 tablet 0   lidocaine-prilocaine (EMLA) cream Apply 1 application topically as needed. Apply small amount to port site at least 1 hour prior to it being accessed, cover with plastic wrap 30 g 3   loperamide (IMODIUM) 2 MG capsule Take 1 capsule (2 mg total) by mouth as needed. Take 2 at diarrhea onset , then 1 every 2hr until 12hrs with no BM. May take 2 every 4hrs at night. If diarrhea recurs repeat. 30 capsule 3   Oxycodone HCl 10 MG TABS TAKE 1 TABLET BY MOUTH EVERY 4 HOURS AS NEEDED 120 tablet 0   pantoprazole (PROTONIX) 40 MG tablet TAKE 1 TABLET BY MOUTH TWICE A DAY BEFORE MEALS 60 tablet 11   prochlorperazine (COMPAZINE) 10 MG tablet Take 1 tablet (10 mg total) by mouth every 6 (six) hours as needed for nausea or vomiting. 30 tablet 0   triamcinolone cream (KENALOG) 0.1 % Apply 1 application topically 3 (three) times daily as needed. 300 each 1   No current facility-administered medications for this visit.    VITAL SIGNS: There were no vitals taken for this visit. There were no vitals filed for this visit.  Estimated body mass index is 27.28 kg/m as calculated from the following:   Height as of 08/29/21: '5\' 11"'$  (1.803 m).   Weight as of 08/29/21: 195 lb 9.6 oz (88.7 kg).  LABS: CBC:    Component Value Date/Time   WBC 5.4 09/11/2021 0827   HGB 11.3 (L) 09/11/2021 0827   HCT 36.1 (L) 09/11/2021 0827   PLT 149 (L) 09/11/2021 0827   MCV 95.0 09/11/2021 0827   NEUTROABS 3.9 09/11/2021 0827   LYMPHSABS 0.9 09/11/2021 0827   MONOABS 0.4 09/11/2021 0827   EOSABS 0.1  09/11/2021 0827   BASOSABS 0.0 09/11/2021 0827   Comprehensive Metabolic Panel:    Component Value Date/Time   NA 139 08/29/2021 0851   K 3.7 08/29/2021 0851   CL 100 08/29/2021 0851   CO2 29 08/29/2021 0851   BUN 5 (L) 08/29/2021 0851   CREATININE 0.68 08/29/2021 0851   GLUCOSE 116 (H) 08/29/2021 0851   CALCIUM 8.6 (L) 08/29/2021 0851   AST 39 08/29/2021 0851   ALT 15 08/29/2021 0851   ALKPHOS 418 (H) 08/29/2021 0851   BILITOT 0.7 08/29/2021 0851   PROT 5.8 (L) 08/29/2021 0851   ALBUMIN 2.8 (L) 08/29/2021 0851    RADIOGRAPHIC STUDIES: No results found.  PERFORMANCE STATUS (ECOG) : 1 - Symptomatic but completely ambulatory  Review of Systems Unless otherwise noted, a complete review of systems is negative.  Physical Exam General: NAD Cardiovascular: regular rate and rhythm Pulmonary: clear ant fields Abdomen: soft, nontender, + bowel sounds GU: no  suprapubic tenderness Extremities: no edema, no joint deformities Skin: Areas of erythema with isolated small pustules but no fluctuance or induration Neurological: Weakness but otherwise nonfocal     Assessment and Plan- Patient is a 66 y.o. male with multiple medical problems including metastatic esophageal cancer with liver metastases on treatment with FOLFIRI chemotherapy.    Rash -this appears superficial without areas of induration or fluctuance.  There are several small isolated pustules but broadly rash is pruritic per patient.  Will start on steroid cream.  May use neosporin cream on pustules. Discussed with OT to see if we can leave arm unwrapped or change wrapping frequency to facilitate healing.  Monitor closely and patient will report if symptoms are worsening.  Patient to RTC tomorrow to see Dr. [name].   Patient expressed understanding and was in agreement with this plan. He also understands that He can call clinic at any time with any questions, concerns, or complaints.   Thank you for allowing me to  participate in the care of this very pleasant patient.   Time Total: 15 minutes  Visit consisted of counseling and education dealing with the complex and emotionally intense issues of symptom management in the setting of serious illness.Greater than 50%  of this time was spent counseling and coordinating care related to the above assessment and plan.  Signed by: Altha Harm, PhD, NP-C

## 2021-09-12 ENCOUNTER — Inpatient Hospital Stay: Payer: Medicare Other

## 2021-09-12 ENCOUNTER — Encounter: Payer: Self-pay | Admitting: Oncology

## 2021-09-12 ENCOUNTER — Other Ambulatory Visit: Payer: Self-pay

## 2021-09-12 ENCOUNTER — Inpatient Hospital Stay (HOSPITAL_BASED_OUTPATIENT_CLINIC_OR_DEPARTMENT_OTHER): Payer: Medicare Other | Admitting: Oncology

## 2021-09-12 VITALS — BP 130/61 | HR 74 | Temp 96.8°F | Resp 18 | Wt 181.8 lb

## 2021-09-12 DIAGNOSIS — C159 Malignant neoplasm of esophagus, unspecified: Secondary | ICD-10-CM

## 2021-09-12 DIAGNOSIS — Z5111 Encounter for antineoplastic chemotherapy: Secondary | ICD-10-CM

## 2021-09-12 DIAGNOSIS — C155 Malignant neoplasm of lower third of esophagus: Secondary | ICD-10-CM | POA: Diagnosis not present

## 2021-09-12 DIAGNOSIS — G893 Neoplasm related pain (acute) (chronic): Secondary | ICD-10-CM | POA: Diagnosis not present

## 2021-09-12 LAB — CBC WITH DIFFERENTIAL/PLATELET
Abs Immature Granulocytes: 0.04 10*3/uL (ref 0.00–0.07)
Basophils Absolute: 0 10*3/uL (ref 0.0–0.1)
Basophils Relative: 1 %
Eosinophils Absolute: 0.1 10*3/uL (ref 0.0–0.5)
Eosinophils Relative: 2 %
HCT: 34.5 % — ABNORMAL LOW (ref 39.0–52.0)
Hemoglobin: 10.7 g/dL — ABNORMAL LOW (ref 13.0–17.0)
Immature Granulocytes: 1 %
Lymphocytes Relative: 9 %
Lymphs Abs: 0.6 10*3/uL — ABNORMAL LOW (ref 0.7–4.0)
MCH: 29.2 pg (ref 26.0–34.0)
MCHC: 31 g/dL (ref 30.0–36.0)
MCV: 94.3 fL (ref 80.0–100.0)
Monocytes Absolute: 0.5 10*3/uL (ref 0.1–1.0)
Monocytes Relative: 7 %
Neutro Abs: 5.3 10*3/uL (ref 1.7–7.7)
Neutrophils Relative %: 80 %
Platelets: 169 10*3/uL (ref 150–400)
RBC: 3.66 MIL/uL — ABNORMAL LOW (ref 4.22–5.81)
RDW: 18.5 % — ABNORMAL HIGH (ref 11.5–15.5)
WBC: 6.5 10*3/uL (ref 4.0–10.5)
nRBC: 0 % (ref 0.0–0.2)

## 2021-09-12 LAB — COMPREHENSIVE METABOLIC PANEL
ALT: 20 U/L (ref 0–44)
AST: 30 U/L (ref 15–41)
Albumin: 3.4 g/dL — ABNORMAL LOW (ref 3.5–5.0)
Alkaline Phosphatase: 363 U/L — ABNORMAL HIGH (ref 38–126)
Anion gap: 6 (ref 5–15)
BUN: <5 mg/dL — ABNORMAL LOW (ref 8–23)
CO2: 24 mmol/L (ref 22–32)
Calcium: 8.7 mg/dL — ABNORMAL LOW (ref 8.9–10.3)
Chloride: 107 mmol/L (ref 98–111)
Creatinine, Ser: 0.59 mg/dL — ABNORMAL LOW (ref 0.61–1.24)
GFR, Estimated: >60 mL/min (ref >60)
Glucose, Bld: 129 mg/dL — ABNORMAL HIGH (ref 70–99)
Potassium: 3.3 mmol/L — ABNORMAL LOW (ref 3.5–5.1)
Sodium: 137 mmol/L (ref 135–145)
Total Bilirubin: 1 mg/dL (ref 0.3–1.2)
Total Protein: 6.2 g/dL — ABNORMAL LOW (ref 6.5–8.1)

## 2021-09-12 MED ORDER — FENTANYL 25 MCG/HR TD PT72: 1.0000 | MEDICATED_PATCH | TRANSDERMAL | 0 refills | Status: DC

## 2021-09-12 MED ORDER — SODIUM CHLORIDE 0.9 % IV SOLN
385.0000 mg/m2 | Freq: Once | INTRAVENOUS | Status: AC
  Administered 2021-09-12: 800 mg via INTRAVENOUS
  Filled 2021-09-12: qty 25

## 2021-09-12 MED ORDER — FLUOROURACIL CHEMO INJECTION 2.5 GM/50ML
400.0000 mg/m2 | Freq: Once | INTRAVENOUS | Status: AC
  Administered 2021-09-12: 850 mg via INTRAVENOUS
  Filled 2021-09-12: qty 17

## 2021-09-12 MED ORDER — SODIUM CHLORIDE 0.9 % IV SOLN
Freq: Once | INTRAVENOUS | Status: AC
  Filled 2021-09-12: qty 250

## 2021-09-12 MED ORDER — ATROPINE SULFATE 1 MG/ML IV SOLN
0.5000 mg | Freq: Once | INTRAVENOUS | Status: AC | PRN
  Administered 2021-09-12: 0.5 mg via INTRAVENOUS
  Filled 2021-09-12: qty 1

## 2021-09-12 MED ORDER — PALONOSETRON HCL INJECTION 0.25 MG/5ML
0.2500 mg | Freq: Once | INTRAVENOUS | Status: AC
  Administered 2021-09-12: 0.25 mg via INTRAVENOUS
  Filled 2021-09-12: qty 5

## 2021-09-12 MED ORDER — SODIUM CHLORIDE 0.9 % IV SOLN
2400.0000 mg/m2 | INTRAVENOUS | Status: DC
  Administered 2021-09-12: 5000 mg via INTRAVENOUS
  Filled 2021-09-12: qty 100

## 2021-09-12 MED ORDER — SODIUM CHLORIDE 0.9 % IV SOLN
10.0000 mg | Freq: Once | INTRAVENOUS | Status: AC
  Administered 2021-09-12: 10 mg via INTRAVENOUS
  Filled 2021-09-12: qty 10

## 2021-09-12 MED ORDER — SODIUM CHLORIDE 0.9 % IV SOLN
180.0000 mg/m2 | Freq: Once | INTRAVENOUS | Status: AC
  Administered 2021-09-12: 380 mg via INTRAVENOUS
  Filled 2021-09-12: qty 15

## 2021-09-12 NOTE — Progress Notes (Signed)
Hematology/Oncology Consult note [facility]  Telephone:([phone] Fax:([phone]  Patient Care Team: Venia Carbon, MD as PCP - General [name], RN as Oncology Nurse Navigator [name], MD as Consulting Physician (Hematology and Oncology)   Name of the patient: Cory [name]  [id number]  1955-11-16   Date of visit: 09/12/21  Diagnosis- metastatic esophageal cancer with liver metastases  Chief complaint/ Reason for visit- on treatment assessment prior to cycle 2 of FOLFIRI chemotherapy  Heme/Onc history: Patient is a 66 year old male who presented with symptoms of indigestion and heartburn which gradually progressed to dysphagia.  He underwent EGD on 06/20/2020 By Dr. Vicente Males which showed a large nearly obstructing mass in the lower third of the esophagus 35 cm from incisors.  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  PD-l1 <1 %, TMB HIGH    Presently patient reports dysphagia but is able to eat cereal or soft food.  He has lost about 13 pounds in the last month itself.  Denies any significant pain.  He recently retired after many years of service and is otherwise independent of his ADLs and IADLs.     PET CT scan on 06/26/2020 showed circumferential distal esophageal mass with an SUV of 13.3 extending over 5.5 cm. Right lower paratracheal node is 0.6 cm with an SUV of 3.1. Hypermetabolic right gastric lymph nodes including 1.1 cm node with an SUV of 5.8. Numerous hypermetabolic lesions throughout the liver somewhat confluent around the periphery which were hypermetabolic between 8 and 9. Faintly hypermetabolic left adrenal gland   NGS testing showed high tumor mutational burden CPS score less than 1. CD9-NRG1 fusion. BRAF 581L, CCN E1 gain.  EMLA for fusion of BX [id number], FGF 23 gain, FGF 6 gain, MET R988C, T p53 S1 48F. ERBB2 gain but no other actionable mutations   Patient currently on first-line  FOLFOX/trastuzumab/Keytruda.  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  Case discussed with neurosurgery and this spot was thought to be difficult to biopsy.    MRI brain in December 2022 showed significant enlargement of this mass to 4 cm with associated edema in the bilateral occipital lobes and marginal hemorrhage along the left aspect of the mass.  Increased subdural hematoma along the left cerebral convexity.  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  Patient received [place] radiation since single fraction to this mass.   PET scan inNovember 2022 showed interval resolution of activity in the esophagus as well as multifocal hepatic metastases and gastrohepatic lymph node.  No evidence of metastatic mediastinal lymph nodes but a small new right lower lobe lung nodule without metabolic activity was seen.  Patient therefore continued 5-FU trastuzumab Keytruda with last cycle given on 07/04/2021.    EchocardiogramIn November 2022 showed EF of 50 to 55% with low normal ejection fraction but no other abnormalities   Patient noted to have significant swelling in his right upper extremity.  CT venogram did not show any vascular stenosis and patient was seen by vascular surgery.  Swelling has been attributed secondary to his fracture that he has had in the past following a fall which has not been operated upon and was allowed to heal by secondary intent   Patient seen at  [facility] Dr. Oralia Rud for second opinion.  They agreed upon Enhertu for second line treatment if he continues to remain HER2 positive on repeat testing.  Patient received 1 cycle of Enhertu but repeat testing subsequently showed her to negativity and was therefore switched to FOLFIRI chemotherapy  Interval history- right arm swelling  has improved with radiation. [place] extremity swelling better with compression stockings.   ECOG PS- 1 Pain scale- 2 Opioid associated constipation- no  Review of systems- Review of Systems  Constitutional:  Positive for malaise/fatigue. Negative for chills, fever and weight loss.  HENT:  Negative for congestion, ear discharge and nosebleeds.   Eyes:  Negative for blurred vision.  Respiratory:  Negative for cough, hemoptysis, sputum production, shortness of breath and wheezing.   Cardiovascular:  Negative for chest pain, palpitations, orthopnea and claudication.  Gastrointestinal:  Negative for abdominal pain, blood in stool, constipation, diarrhea, heartburn, melena, nausea and vomiting.  Genitourinary:  Negative for dysuria, flank pain, frequency, hematuria and urgency.  Musculoskeletal:  Negative for back pain, joint pain and myalgias.  Skin:  Negative for rash.  Neurological:  Negative for dizziness, tingling, focal weakness, seizures, weakness and headaches.  Endo/Heme/Allergies:  Does not bruise/bleed easily.  Psychiatric/Behavioral:  Negative for depression and suicidal ideas. The patient does not have insomnia.      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   Family history of brain cancer    Family history of colon cancer    Family history of melanoma    Family history of stomach cancer    GERD (gastroesophageal reflux disease)    History of kidney stones    Nephrolithiasis    [facility]   Pancreatitis, acute    Personal history of colonic polyps    Dr Nicolasa Ducking   Psoriasis      Past Surgical History:  Procedure Laterality Date   COLONOSCOPY     COLONOSCOPY WITH PROPOFOL N/A 03/31/2020   Procedure: COLONOSCOPY WITH PROPOFOL;  Surgeon: Jonathon Bellows, MD;  Location: [facility] ENDOSCOPY;  Service: Gastroenterology;  Laterality: N/A;   ERCP     ESOPHAGOGASTRODUODENOSCOPY (EGD) WITH PROPOFOL N/A 06/19/2020   Procedure:  ESOPHAGOGASTRODUODENOSCOPY (EGD) WITH PROPOFOL;  Surgeon: Jonathon Bellows, MD;  Location: [facility] ENDOSCOPY;  Service: Gastroenterology;  Laterality: N/A;   groin surgery     as a child   PORTA CATH INSERTION N/A 06/28/2020   Procedure: PORTA CATH INSERTION;  Surgeon: Algernon Huxley, MD;  Location: [place] CV LAB;  Service: Cardiovascular;  Laterality: N/A;   SHOULDER SURGERY     UPPER EXTREMITY VENOGRAPHY Right 07/24/2021   Procedure: UPPER EXTREMITY VENOGRAPHY;  Surgeon: Katha Cabal, MD;  Location: [place] CV LAB;  Service: Cardiovascular;  Laterality: Right;    Social History   Socioeconomic History   Marital status: Married    Spouse name: Not on file   Number of children: 2   Years of education: Not on file   Highest education level: Not on file  Occupational History   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  Tobacco Use   Smoking status: Former    Types: Cigarettes    Quit date: 07/08/1988    Years since quitting: 33.2   Smokeless tobacco: Never  Vaping Use   Vaping Use: Never used  Substance and Sexual Activity   Alcohol use: No    Comment: recovering alcoholic for 13 years   Drug use: No  Sexual activity: Not Currently  Other Topics Concern   Not on file  Social History Narrative   Has living will--needs to get notarized   Daughter should be health care POA (wife with dementia)   Would accept resuscitation   Would accept tube feeds depending on the circumstance   Social Determinants of Health   Financial Resource Strain: Not on file  Food Insecurity: Not on file  Transportation Needs: Not on file  Physical Activity: Not on file  Stress: Not on file  Social Connections: Not on file  Intimate Partner Violence: Not on file    Family History  Problem Relation Age of Onset   Stomach cancer Mother    Diabetes Brother    Hypertension Brother    Hypertension Father    Prostate cancer Father    Colon cancer Sister    Cancer  Other        either cervical or ovarian, d. 21s   Brain cancer Sister      Current Outpatient Medications:    dexamethasone (DECADRON) 4 MG tablet, Take 1 tablet (4 mg total) by mouth 2 (two) times daily., Disp: 28 tablet, Rfl: 0   lidocaine-prilocaine (EMLA) cream, Apply 1 application topically as needed. Apply small amount to port site at least 1 hour prior to it being accessed, cover with plastic wrap, Disp: 30 g, Rfl: 3   Oxycodone HCl 10 MG TABS, TAKE 1 TABLET BY MOUTH EVERY 4 HOURS AS NEEDED, Disp: 120 tablet, Rfl: 0   pantoprazole (PROTONIX) 40 MG tablet, TAKE 1 TABLET BY MOUTH TWICE A DAY BEFORE MEALS, Disp: 60 tablet, Rfl: 11   triamcinolone ointment (KENALOG) 0.5 %, Apply 1 application. topically 2 (two) times daily., Disp: 30 g, Rfl: 0   fentaNYL (DURAGESIC) 25 MCG/HR, Place 1 patch onto the skin every 3 (three) days., Disp: 5 patch, Rfl: 0   furosemide (LASIX) 20 MG tablet, Take 1 tablet (20 mg total) by mouth daily. (Patient not taking: Reported on 09/12/2021), Disp: 10 tablet, Rfl: 0   loperamide (IMODIUM) 2 MG capsule, Take 1 capsule (2 mg total) by mouth as needed. Take 2 at diarrhea onset , then 1 every 2hr until 12hrs with no BM. May take 2 every 4hrs at night. If diarrhea recurs repeat. (Patient not taking: Reported on 09/12/2021), Disp: 30 capsule, Rfl: 3   prochlorperazine (COMPAZINE) 10 MG tablet, Take 1 tablet (10 mg total) by mouth every 6 (six) hours as needed for nausea or vomiting. (Patient not taking: Reported on 09/12/2021), Disp: 30 tablet, Rfl: 0 No current facility-administered medications for this visit.  Facility-Administered Medications Ordered in Other Visits:    fluorouracil (ADRUCIL) 5,000 mg in sodium chloride 0.9 % 150 mL chemo infusion, 2,400 mg/m2 (Treatment Plan Recorded), Intravenous, 1 day or 1 dose, [name], MD   fluorouracil (ADRUCIL) chemo injection 850 mg, 400 mg/m2 (Treatment Plan Recorded), Intravenous, Once, [name], MD   irinotecan  (CAMPTOSAR) 380 mg in sodium chloride 0.9 % 500 mL chemo infusion, 180 mg/m2 (Treatment Plan Recorded), Intravenous, Once, [name], MD, Last Rate: 346 mL/hr at 09/12/21 1146, 380 mg at 09/12/21 1146   leucovorin 800 mg in sodium chloride 0.9 % 250 mL infusion, 385 mg/m2 (Treatment Plan Recorded), Intravenous, Once, [name], MD, Last Rate: 193 mL/hr at 09/12/21 1147, 800 mg at 09/12/21 1147  Physical exam:  Vitals:   09/12/21 0907  BP: 130/61  Pulse: 74  Resp: 18  Temp: (!) 96.8 F (  36 C)  SpO2: 100%  Weight: 181 lb 12.8 oz (82.5 kg)   Physical Exam Cardiovascular:     Rate and Rhythm: Normal rate and regular rhythm.     Heart sounds: Normal heart sounds.  Pulmonary:     Effort: Pulmonary effort is normal.     Breath sounds: Normal breath sounds.  Abdominal:     General: Bowel sounds are normal.     Palpations: Abdomen is soft.  Musculoskeletal:     Comments: Right upper extremity is more swollen than the left but overall swelling has improved significantly.  Bilateral compression stockings in place for the lower extremities  Skin:    General: Skin is warm and dry.     Comments: Erythematous macular rash seen over right shoulder secondary to recent radiation treatment  Neurological:     Mental Status: He is alert and oriented to person, place, and time.     CMP Latest Ref Rng & Units 09/12/2021  Glucose 70 - 99 mg/dL 129(H)  BUN 8 - 23 mg/dL <5(L)  Creatinine 0.61 - 1.24 mg/dL 0.59(L)  Sodium 135 - 145 mmol/L 137  Potassium 3.5 - 5.1 mmol/L 3.3(L)  Chloride 98 - 111 mmol/L 107  CO2 22 - 32 mmol/L 24  Calcium 8.9 - 10.3 mg/dL 8.7(L)  Total Protein 6.5 - 8.1 g/dL 6.2(L)  Total Bilirubin 0.3 - 1.2 mg/dL 1.0  Alkaline Phos 38 - 126 U/L 363(H)  AST 15 - 41 U/L 30  ALT 0 - 44 U/L 20   CBC Latest Ref Rng & Units 09/12/2021  WBC 4.0 - 10.5 K/uL 6.5  Hemoglobin 13.0 - 17.0 g/dL 10.7(L)  Hematocrit 39.0 - 52.0 % 34.5(L)  Platelets 150 - 400 K/uL 169      Assessment and plan- Patient is a 66 y.o. male with metastatic GE junction adenocarcinoma with progression on first-line FOLFOX Keytruda chemotherapy here for on treatment assessment prior to cycle 2 of FOLFIRI chemotherapy  Counts okay to proceed with FOLFIRI chemotherapy today with Udenyca on day 3.  Clinically patient has shown improvement in his right upper extremity swelling after receiving palliative radiation.  Lower extremity swelling is also better after using compression stockings.  I will see him back in 2 weeks for cycle 3 of FOLFIRI chemotherapy.  Plan is to repeat scans after 5 cycles.  CEA from today is pending  Neoplasm related pain: Continue fentanyl patch and as needed oxycodone   Visit Diagnosis 1. Esophageal adenocarcinoma ([place])   2. Encounter for antineoplastic chemotherapy   3. Neoplasm related pain      Dr. Randa Evens, MD, MPH [facility] [phone] 09/12/2021 12:50 PM

## 2021-09-12 NOTE — Progress Notes (Signed)
Pt states that when he tries to take a deep breath it becomes painful in his chest.  ?

## 2021-09-13 LAB — CEA: CEA: 263 ng/mL — ABNORMAL HIGH (ref 0.0–4.7)

## 2021-09-14 ENCOUNTER — Inpatient Hospital Stay: Payer: Medicare Other

## 2021-09-14 ENCOUNTER — Other Ambulatory Visit: Payer: Self-pay

## 2021-09-14 VITALS — BP 123/93 | HR 127 | Temp 97.1°F | Resp 18

## 2021-09-14 DIAGNOSIS — C155 Malignant neoplasm of lower third of esophagus: Secondary | ICD-10-CM | POA: Diagnosis not present

## 2021-09-14 DIAGNOSIS — C159 Malignant neoplasm of esophagus, unspecified: Secondary | ICD-10-CM

## 2021-09-14 MED ORDER — HEPARIN SOD (PORK) LOCK FLUSH 100 UNIT/ML IV SOLN
500.0000 [IU] | Freq: Once | INTRAVENOUS | Status: AC | PRN
  Filled 2021-09-14: qty 5

## 2021-09-14 MED ORDER — HEPARIN SOD (PORK) LOCK FLUSH 100 UNIT/ML IV SOLN
INTRAVENOUS | Status: AC
  Administered 2021-09-14: 500 [IU]
  Filled 2021-09-14: qty 5

## 2021-09-14 MED ORDER — SODIUM CHLORIDE 0.9% FLUSH
10.0000 mL | INTRAVENOUS | Status: DC | PRN
  Administered 2021-09-14: 10 mL
  Filled 2021-09-14: qty 10

## 2021-09-14 MED ORDER — PEGFILGRASTIM-CBQV 6 MG/0.6ML ~~LOC~~ SOSY
6.0000 mg | PREFILLED_SYRINGE | Freq: Once | SUBCUTANEOUS | Status: AC
  Administered 2021-09-14: 6 mg via SUBCUTANEOUS
  Filled 2021-09-14: qty 0.6

## 2021-09-14 NOTE — Progress Notes (Signed)
Patient states starting on 3/8, after chemotherapy, that he started having diarrhea. Reports it was approximately 2 episodes a day and "was not a lot". Patient did mention that he is eating and drinking well. HR 127. Patient denies any s/s at this time and reports that he is "feeling good". Dr. [name] made aware. Okay to proceed with Udenyca per Dr. [name]. Patient states he is unable to stay for IVFs. Patient educated to push oral fluids at home including electrolyte rich fluids and was educated on s/s as to when to seek emergency care. Patient verbalizes understanding and denies any further questions or concerns.  ?

## 2021-09-17 ENCOUNTER — Ambulatory Visit: Payer: Medicare Other | Admitting: Occupational Therapy

## 2021-09-17 ENCOUNTER — Other Ambulatory Visit: Payer: Self-pay

## 2021-09-17 DIAGNOSIS — I890 Lymphedema, not elsewhere classified: Secondary | ICD-10-CM

## 2021-09-17 NOTE — Therapy (Signed)
[place] [facility] [address], [place], [address] Phone: [phone]   Fax:  [phone]  Occupational Therapy Screen  Patient Details  Name: Cory [name] MRN: [id number] Date of Birth: 1955-10-30 No data recorded  Encounter Date: 09/17/2021   OT End of Session - 09/17/21 1835     Visit Number 0             Past Medical History:  Diagnosis Date   Esophageal adenocarcinoma ([place]) 06/23/2020   Family history of brain cancer    Family history of colon cancer    Family history of melanoma    Family history of stomach cancer    GERD (gastroesophageal reflux disease)    History of kidney stones    Nephrolithiasis    [facility]   Pancreatitis, acute    Personal history of colonic polyps    Dr Nicolasa Ducking   Psoriasis     Past Surgical History:  Procedure Laterality Date   COLONOSCOPY     COLONOSCOPY WITH PROPOFOL N/A 03/31/2020   Procedure: COLONOSCOPY WITH PROPOFOL;  Surgeon: Jonathon Bellows, MD;  Location: [facility] ENDOSCOPY;  Service: Gastroenterology;  Laterality: N/A;   ERCP     ESOPHAGOGASTRODUODENOSCOPY (EGD) WITH PROPOFOL N/A 06/19/2020   Procedure: ESOPHAGOGASTRODUODENOSCOPY (EGD) WITH PROPOFOL;  Surgeon: Jonathon Bellows, MD;  Location: [facility] ENDOSCOPY;  Service: Gastroenterology;  Laterality: N/A;   groin surgery     as a child   PORTA CATH INSERTION N/A 06/28/2020   Procedure: PORTA CATH INSERTION;  Surgeon: Algernon Huxley, MD;  Location: [place] [facility];  Service: Cardiovascular;  Laterality: N/A;   SHOULDER SURGERY     UPPER EXTREMITY VENOGRAPHY Right 07/24/2021   Procedure: UPPER EXTREMITY VENOGRAPHY;  Surgeon: Katha Cabal, MD;  Location: [facility] [facility];  Service: Cardiovascular;  Laterality: Right;    There were no vitals filed for this visit.   Subjective Assessment - 09/17/21 1834     Subjective  My arm did not fit in the compression garment Friday -but thank you for calling me -we did  bandage some over the weekend to get swelling down -and it is better again but my skin do not like it - especially my armpit    Currently in Pain? No/denies                 LYMPHEDEMA/ONCOLOGY QUESTIONNAIRE - 09/17/21 0001       Right Upper Extremity Lymphedema   15 cm Proximal to Olecranon Process 30.2 cm    10 cm Proximal to Olecranon Process 28.5 cm    Olecranon Process 28.2 cm    15 cm Proximal to Ulnar Styloid Process 26.4 cm    10 cm Proximal to Ulnar Styloid Process 22.4 cm    Just Proximal to Ulnar Styloid Process 18.6 cm      Left Upper Extremity Lymphedema   Across Hand at [employer]             OT SCREEN 09/06/21:  Pt arrive with  compression  bandages on R UE - son with him today - kept bandages on for 48 hrs    Circumference taken for R UE Measurements  - decrease greatly  again compare to  2 days ago and last week - upper arm around top of shoulder swollen  but less than 2 days ago - done some fibrotic techniques - son to help with prior to MLD again up there over top  of shoulder some during day  Review with pt to do some pendulum for shoulder pain free  And shoulder  AAROM gentle in supine for flexion - cont AROM elbow , wrist and hand    Would not recommend at this time for pt to try and donn 20-35mHG over the counter compression sleeve -do not have the strength and will reinjured UE -and  compression sleeve do not decrease but maintain. Son to replace bandage in hour to 2 after getting measured for circ-aid.- Leaving now to get measured Eucerin lotion, Isotoner glove  and soft stockinet Rosidal foam from hand to upper arm  6 cm short stretch 3 x thru and over hand to forearm figure 8's, 8 cm short stretch wrist , hand , to elbow  figure 8's, 10 cm  mid forearm to  upper arm - stop below radiation area and circular Pt leaving to get measured for Circ-aid  [place] [facility]:  Pt arrive with  compression  bandages on R UE - D-I-L  with  pt Bandages removed-patient's arm with several red pimples of which 3 of them had puss Skin irritated-patient report arm itchy Son changed bandages last Thursday and Friday-daughter changed yesterday when noticing redness and irritation Applied since last time radiation lotion on arm-did not apply any lotion yesterday This OT contacted symptoms management at cancer center-was unable to get appointment today-patient was unable to do telehealth Patient has appointment 830 tomorrow morning with symptoms management After patient washed arm applied Neosporin on any open areas or pimples-and Cortaid lotion for itching Symptoms management nurse educated patient on symptoms and signs that were causing over the night to go to the ED Patient denies fever,pain    Await circ-aid compression garments that should be in this week per Rep from [facility]  and soft stockinet donn Rosidal foam from hand to upper arm circular 50% overlay 6 cm short stretch 3 x thru and over hand to forearm figure 8's, 8 cm short stretch wrist , hand , to elbow  figure 8's, 10 cm  mid forearm to  upper arm - stop below radiation area in upper arm Pt to keep arm out to side on pillow to air his axilla   Circ-aid pt will be able to wear during day and night time -and adjust himself   Pt to follow up with me later in week  for ed and fitting when Circ-aids come in     09/11/20 OT screen:  Pt seen today to assess appropriateness for compression wrapping to RUE d/t new skin irritation.  Collaborated with JVonna Kotyk NP who felt rash was likely d/t moisture from lotion application post radiation tx followed by compression wrapping, and may need to hold off on wrapping this day to limit the skin irritation to RUE.  OT in agreement to allow skin to "breathe," and NP is prescribing new topical cream to tx the irritated skin.  OT encouraged pt that lymphedema appeared well managed at this time and as long as pt can continue  to perform self MLD and AROM/AAROM exercises to promote lymph flow throughout RUE, anticipate that leaving compression wraps off until [place] should not contribute to much or any set back with lymph fluid increase.  Circaid garment was suppose to arrive yesterday but did not, so anticipate in the next day or so.  OT reviewed MLD techniques for upper/lower arm/hand with fair return demo.  OT reviewed  self AAROM for R shoulder flex/abd, active elbow flex/ext, forearm pron/sup, wrist flex/ext, digit flex/ext with good return demo.  Instructed pt in table slides for R shoulder flex/abd to tolerance.  Encouraged pt complete MLD and ther ex 1-2x per day to promote lymph flow.  Pt has redness and skin irritation on R upper torso inferior to axilla.  OT encouraged pt position self with R arm slightly abducted with pillow when able to reduce skin friction which can contribute to increased irritation.  Pt verbalized understanding of all education provided and verbalized understanding of how to contact OT for follow up when Circaid garment arrives.    OT SCREEN 09/17/20: Patient arrived today in therapy without bandages on-daughter accompany him. Skin improved greatly with use of medication.  Patient did not fit Friday and the CircAid compression because of being out of compression since the seventh-because of skin issues. Did discuss last Friday via phone with daughter and patient about doing some compression bandaging over the past weekend without increasing skin irritation. Patient's circumference of right upper extremity decrease per patient and daughter compared to last Friday-contact [facility]-patient able to go tomorrow to try and get fitted for CircAid's again. Patient daughter education done of doing light compression overnight until tomorrow to decrease forearm and elbow without skin irritation. Patient to follow-up with me next Monday after wearing CircAid's for the rest of the  week-and doing light active range of motion of right upper extremity. Pain-free Patient and daughter in agreement                                     Visit Diagnosis: Lymphedema, not elsewhere classified    Problem List Patient Active Problem List   Diagnosis Date Noted   Lymphedema 07/20/2021   Swelling of arm 07/17/2021   Great toe pain, left 02/06/2021   Advance directive discussed with patient 02/06/2021   Genetic testing 09/07/2020   Family history of colon cancer    Family history of brain cancer    Family history of stomach cancer    Family history of melanoma    Goals of care, counseling/discussion 06/23/2020   Esophageal adenocarcinoma ([place]) 06/23/2020   Esophageal dysphagia 05/26/2020   Erectile dysfunction 09/20/2013   Routine general medical examination at a health care facility 09/13/2011   Psoriasis 09/13/2011   ALLERGIC RHINITIS 04/28/2009   GERD 04/28/2009   COLONIC POLYPS, HX OF 04/28/2009    Rosalyn Gess, OTR/L,CLT 09/17/2021, 6:36 PM  [place] [facility] [address], [place], [address] Phone: [phone]   Fax:  [phone]  Name: TAI SKELLY MRN: [id number] Date of Birth: [birth year]

## 2021-09-19 ENCOUNTER — Inpatient Hospital Stay: Payer: Medicare Other | Admitting: Hospice and Palliative Medicine

## 2021-09-20 ENCOUNTER — Encounter: Payer: Self-pay | Admitting: Medical Oncology

## 2021-09-20 ENCOUNTER — Other Ambulatory Visit: Payer: Self-pay

## 2021-09-20 ENCOUNTER — Inpatient Hospital Stay (HOSPITAL_BASED_OUTPATIENT_CLINIC_OR_DEPARTMENT_OTHER): Payer: Medicare Other | Admitting: Medical Oncology

## 2021-09-20 VITALS — BP 132/66 | HR 77 | Temp 97.7°F | Resp 16

## 2021-09-20 DIAGNOSIS — K1230 Oral mucositis (ulcerative), unspecified: Secondary | ICD-10-CM

## 2021-09-20 DIAGNOSIS — C159 Malignant neoplasm of esophagus, unspecified: Secondary | ICD-10-CM | POA: Diagnosis not present

## 2021-09-20 DIAGNOSIS — R1310 Dysphagia, unspecified: Secondary | ICD-10-CM | POA: Diagnosis not present

## 2021-09-20 DIAGNOSIS — L739 Follicular disorder, unspecified: Secondary | ICD-10-CM

## 2021-09-20 DIAGNOSIS — I499 Cardiac arrhythmia, unspecified: Secondary | ICD-10-CM

## 2021-09-20 DIAGNOSIS — C155 Malignant neoplasm of lower third of esophagus: Secondary | ICD-10-CM | POA: Diagnosis not present

## 2021-09-20 DIAGNOSIS — R21 Rash and other nonspecific skin eruption: Secondary | ICD-10-CM | POA: Diagnosis not present

## 2021-09-20 MED ORDER — DOXYCYCLINE HYCLATE 100 MG PO TABS: 100.0000 mg | ORAL_TABLET | Freq: Two times a day (BID) | ORAL | 0 refills | Status: AC

## 2021-09-20 NOTE — Progress Notes (Signed)
Pt returns to [facility] with concerns of persistent redness and irritation to right arm. Pt states that redness is worse and feels that the triamcinolone is not helping. States that skin is raw and oozing to axillary area.  ?

## 2021-09-20 NOTE — Progress Notes (Signed)
? ? ? ?Hematology/Oncology Consult note ?[place]  ?[phone] [phone] ? ?Patient Care Team: ?Venia Carbon, MD as PCP - General ?[name], RN as Oncology Nurse Navigator ?[name], MD as Consulting Physician (Hematology and Oncology)  ? ?Name of the patient: Cory [name]  ?[id number]  ?[birth year]  ? ?Date of visit: 09/20/21 ? 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 ?Chief complaint/ Reason for visit- on treatment assessment prior to cycle 2 of FOLFIRI chemotherapy ? ?Heme/Onc history: Patient is a 66 year old male who presented with symptoms of indigestion and heartburn which gradually progressed to dysphagia.  He underwent EGD on 06/20/2020 By Dr. Vicente Males which showed a large nearly obstructing mass in the lower third of the esophagus 35 cm from incisors.  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  PD-l1 <1 %, TMB HIGH ?  ? Presently patient reports dysphagia but is able to eat cereal or soft food.  He has lost about 13 pounds in the last month itself.  Denies any significant pain.  He recently retired after many years of service and is otherwise independent of his ADLs and IADLs.   ?  ?PET CT scan on 06/26/2020 showed circumferential distal esophageal mass with an SUV of 13.3 extending over 5.5 cm. Right lower paratracheal node is 0.6 cm with an SUV of 3.1. Hypermetabolic right gastric lymph nodes including 1.1 cm node with an SUV of 5.8. Numerous hypermetabolic lesions throughout the liver somewhat confluent around the periphery which were hypermetabolic between 8 and 9. Faintly hypermetabolic left adrenal gland ?  ?NGS testing showed high tumor mutational burden CPS score less than 1. CD9-NRG1 fusion. BRAF 581L, CCN E1 gain.  EMLA for fusion of BX [id number], FGF 23 gain, FGF 6 gain, MET R988C, T p53 S1 66F. ERBB2 gain but no other actionable mutations ?  ?Patient currently on first-line  FOLFOX/trastuzumab/Keytruda.  He gets Keytruda every 6 weeks ?  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  Case discussed with neurosurgery and this spot was thought to be difficult to biopsy.    MRI brain in December 2022 showed significant enlargement of this mass to 4 cm with associated edema in the bilateral occipital lobes and marginal hemorrhage along the left aspect of the mass.  Increased subdural hematoma along the left cerebral convexity.  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  Patient received [place] radiation since single fraction to this mass. ?  ?PET scan inNovember 2022 showed interval resolution of activity in the esophagus as well as multifocal hepatic metastases and gastrohepatic lymph node.  No evidence of metastatic mediastinal lymph nodes but a small new right lower lobe lung nodule without metabolic activity was seen.  Patient therefore continued 5-FU trastuzumab Keytruda with last cycle given on 07/04/2021.  ?  ?EchocardiogramIn November 2022 showed EF of 50 to 55% with low normal ejection fraction but no other abnormalities ?  ?Patient noted to have significant swelling in his right upper extremity.  CT venogram did not show any vascular stenosis and patient was seen by vascular surgery.  Swelling has been attributed secondary to his fracture that he has had in the past following a fall which has not been operated upon and was allowed to heal by secondary intent ?  ?Patient seen at  [facility] Dr. Oralia Rud for second opinion.  They agreed upon Enhertu for second line treatment if he continues to remain HER2 positive on repeat testing.  Patient received 1 cycle of Enhertu but repeat testing subsequently showed her to negativity and was therefore switched to FOLFIRI chemotherapy ? ?Interval history- Patient presents  today for worsening redness of the right arm. Initially prescribed triamcinolone for rash with pruritus which did help but then symptoms worsened. Itching is no longer present. Has also tried corn starch powder without much improvement. Now having pustules and some tenderness to palpation. No fever or MRSA history. He is otherwise feeling well but does report that he continues to struggle with a burning sensation in his mouth and that foods do not taste the same as they had before chemotherapy. He has not  tried anything for symptoms. ? ?ECOG PS- 1 ?Pain scale- 2 ?Opioid associated constipation- no ? ?Review of systems- Review of Systems  ?Constitutional:  Negative for chills, fever, malaise/fatigue and weight loss.  ?HENT:  Negative for congestion, ear discharge and nosebleeds.   ?Eyes:  Negative for blurred vision.  ?Respiratory:  Negative for cough, hemoptysis, sputum production, shortness of breath and wheezing.   ?Cardiovascular:  Negative for chest pain, palpitations, orthopnea and claudication.  ?Gastrointestinal:  Negative for abdominal pain, blood in stool, constipation, diarrhea, heartburn, melena, nausea and vomiting.  ?Genitourinary:  Negative for dysuria, flank pain, frequency, hematuria and urgency.  ?Musculoskeletal:  Negative for back pain, joint pain and myalgias.  ?Skin:  Positive for rash.  ?Neurological:  Negative for dizziness, tingling, focal weakness, seizures, weakness and headaches.  ?Endo/Heme/Allergies:  Does not bruise/bleed easily.  ?Psychiatric/Behavioral:  Negative for depression and suicidal ideas. The patient does not have insomnia.    ? ? ?No Known Allergies ? ? ?Past Medical History:  ?Diagnosis Date  ? Esophageal adenocarcinoma ([place]) 06/23/2020  ? Family history of brain cancer   ? Family history of colon cancer   ? Family history of melanoma   ? Family history of stomach cancer   ? GERD (gastroesophageal reflux disease)   ? History of kidney stones   ? Nephrolithiasis   ?  [facility]  ? Pancreatitis, acute   ? Personal history of colonic polyps   ? Dr Nicolasa Ducking  ? Psoriasis   ? ? ? ?Past Surgical History:  ?Procedure Laterality Date  ? COLONOSCOPY    ? COLONOSCOPY WITH PROPOFOL N/A 03/31/2020  ? Procedure: COLONOSCOPY WITH PROPOFOL;  Surgeon: Jonathon Bellows, MD;  Location: [facility] ENDOSCOPY;  Service: Gastroenterology;  Laterality: N/A;  ? ERCP    ? ESOPHAGOGASTRODUODENOSCOPY (EGD) WITH PROPOFOL N/A 06/19/2020  ? Procedure: ESOPHAGOGASTRODUODENOSCOPY (EGD) WITH PROPOFOL;  Surgeon: Jonathon Bellows, MD;  Location: [facility] ENDOSCOPY;  Service: Gastroenterology;  Laterality: N/A;  ? groin surgery    ? as a child  ? PORTA CATH INSERTION N/A 06/28/2020  ? Procedure: PORTA CATH INSERTION;  Surgeon: Algernon Huxley, MD;  Location: [place] [facility];  Service: Cardiovascular;  Laterality: N/A;  ? SHOULDER SURGERY    ? UPPER EXTREMITY VENOGRAPHY Right 07/24/2021  ? Procedure: UPPER EXTREMITY VENOGRAPHY;  Surgeon: Katha Cabal, MD;  Location: [place] [facility];  Service: Cardiovascular;  Laterality: Right;  ? ? ?Social History  ? ?Socioeconomic History  ? Marital status: Married  ?  Spouse name: Not on file  ? Number of children: 2  ? Years of education: Not on file  ? Highest education level: Not on file  ?Occupational History  ?  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 Smoking status: Former  ?  Types: Cigarettes  ?  Quit date: 07/08/1988  ?  Years since quitting: 33.2  ? Smokeless tobacco: Never  ?Vaping Use  ? Vaping Use: Never used  ?Substance and Sexual Activity  ? Alcohol use: No  ?  Comment: recovering alcoholic for 13 years  ? Drug use: No  ? Sexual activity: Not Currently  ?Other Topics Concern  ? Not on file  ?Social History Narrative  ? Has living will--needs to get notarized  ? Daughter should be health care POA (wife with dementia)  ? Would accept resuscitation  ? Would accept tube feeds depending on the circumstance  ? ?Social Determinants of Health   ? ?Financial Resource Strain: Not on file  ?Food Insecurity: Not on file  ?Transportation Needs: Not on file  ?Physical Activity: Not on file  ?Stress: Not on file  ?Social Connections: Not on file  ?Intimate Partner Vi

## 2021-09-26 ENCOUNTER — Encounter: Payer: Self-pay | Admitting: Oncology

## 2021-09-26 ENCOUNTER — Inpatient Hospital Stay (HOSPITAL_BASED_OUTPATIENT_CLINIC_OR_DEPARTMENT_OTHER): Payer: Medicare Other | Admitting: Oncology

## 2021-09-26 ENCOUNTER — Other Ambulatory Visit: Payer: Self-pay | Admitting: *Deleted

## 2021-09-26 ENCOUNTER — Other Ambulatory Visit: Payer: Self-pay

## 2021-09-26 ENCOUNTER — Inpatient Hospital Stay: Payer: Medicare Other | Admitting: Hospice and Palliative Medicine

## 2021-09-26 ENCOUNTER — Inpatient Hospital Stay: Payer: Medicare Other

## 2021-09-26 VITALS — BP 131/72 | HR 74 | Temp 98.0°F | Resp 16 | Ht 71.0 in | Wt 179.6 lb

## 2021-09-26 DIAGNOSIS — G893 Neoplasm related pain (acute) (chronic): Secondary | ICD-10-CM

## 2021-09-26 DIAGNOSIS — C155 Malignant neoplasm of lower third of esophagus: Secondary | ICD-10-CM | POA: Diagnosis not present

## 2021-09-26 DIAGNOSIS — C159 Malignant neoplasm of esophagus, unspecified: Secondary | ICD-10-CM

## 2021-09-26 DIAGNOSIS — Z95828 Presence of other vascular implants and grafts: Secondary | ICD-10-CM

## 2021-09-26 DIAGNOSIS — Z5111 Encounter for antineoplastic chemotherapy: Secondary | ICD-10-CM

## 2021-09-26 LAB — COMPREHENSIVE METABOLIC PANEL
ALT: 16 U/L (ref 0–44)
AST: 25 U/L (ref 15–41)
Albumin: 3.4 g/dL — ABNORMAL LOW (ref 3.5–5.0)
Alkaline Phosphatase: 328 U/L — ABNORMAL HIGH (ref 38–126)
Anion gap: 7 (ref 5–15)
BUN: 7 mg/dL — ABNORMAL LOW (ref 8–23)
CO2: 25 mmol/L (ref 22–32)
Calcium: 8.9 mg/dL (ref 8.9–10.3)
Chloride: 106 mmol/L (ref 98–111)
Creatinine, Ser: 0.72 mg/dL (ref 0.61–1.24)
GFR, Estimated: >60 mL/min (ref >60)
Glucose, Bld: 122 mg/dL — ABNORMAL HIGH (ref 70–99)
Potassium: 3.5 mmol/L (ref 3.5–5.1)
Sodium: 138 mmol/L (ref 135–145)
Total Bilirubin: 0.6 mg/dL (ref 0.3–1.2)
Total Protein: 6.1 g/dL — ABNORMAL LOW (ref 6.5–8.1)

## 2021-09-26 LAB — CBC WITH DIFFERENTIAL/PLATELET
Abs Immature Granulocytes: 0.09 10*3/uL — ABNORMAL HIGH (ref 0.00–0.07)
Basophils Absolute: 0 10*3/uL (ref 0.0–0.1)
Basophils Relative: 1 %
Eosinophils Absolute: 0.1 10*3/uL (ref 0.0–0.5)
Eosinophils Relative: 2 %
HCT: 33.3 % — ABNORMAL LOW (ref 39.0–52.0)
Hemoglobin: 10.5 g/dL — ABNORMAL LOW (ref 13.0–17.0)
Immature Granulocytes: 2 %
Lymphocytes Relative: 20 %
Lymphs Abs: 0.8 10*3/uL (ref 0.7–4.0)
MCH: 29.6 pg (ref 26.0–34.0)
MCHC: 31.5 g/dL (ref 30.0–36.0)
MCV: 93.8 fL (ref 80.0–100.0)
Monocytes Absolute: 0.5 10*3/uL (ref 0.1–1.0)
Monocytes Relative: 11 %
Neutro Abs: 2.6 10*3/uL (ref 1.7–7.7)
Neutrophils Relative %: 64 %
Platelets: 229 10*3/uL (ref 150–400)
RBC: 3.55 MIL/uL — ABNORMAL LOW (ref 4.22–5.81)
RDW: 17.3 % — ABNORMAL HIGH (ref 11.5–15.5)
WBC: 4.1 10*3/uL (ref 4.0–10.5)
nRBC: 0 % (ref 0.0–0.2)

## 2021-09-26 MED ORDER — SODIUM CHLORIDE 0.9 % IV SOLN
Freq: Once | INTRAVENOUS | Status: AC
  Filled 2021-09-26: qty 250

## 2021-09-26 MED ORDER — SODIUM CHLORIDE 0.9 % IV SOLN
180.0000 mg/m2 | Freq: Once | INTRAVENOUS | Status: AC
  Administered 2021-09-26: 380 mg via INTRAVENOUS
  Filled 2021-09-26: qty 4

## 2021-09-26 MED ORDER — FENTANYL 25 MCG/HR TD PT72: 1.0000 | MEDICATED_PATCH | TRANSDERMAL | 0 refills | Status: DC

## 2021-09-26 MED ORDER — SODIUM CHLORIDE 0.9 % IV SOLN
10.0000 mg | Freq: Once | INTRAVENOUS | Status: AC
  Administered 2021-09-26: 10 mg via INTRAVENOUS
  Filled 2021-09-26: qty 10

## 2021-09-26 MED ORDER — ATROPINE SULFATE 1 MG/ML IV SOLN
0.5000 mg | Freq: Once | INTRAVENOUS | Status: AC | PRN
  Administered 2021-09-26: 0.5 mg via INTRAVENOUS
  Filled 2021-09-26: qty 1

## 2021-09-26 MED ORDER — SODIUM CHLORIDE 0.9% FLUSH
10.0000 mL | Freq: Once | INTRAVENOUS | Status: AC
  Administered 2021-09-26: 10 mL via INTRAVENOUS
  Filled 2021-09-26: qty 10

## 2021-09-26 MED ORDER — PALONOSETRON HCL INJECTION 0.25 MG/5ML
0.2500 mg | Freq: Once | INTRAVENOUS | Status: AC
  Administered 2021-09-26: 0.25 mg via INTRAVENOUS
  Filled 2021-09-26: qty 5

## 2021-09-26 MED ORDER — OXYCODONE HCL 10 MG PO TABS: 10.0000 mg | ORAL_TABLET | ORAL | 0 refills | Status: DC | PRN

## 2021-09-26 MED ORDER — SODIUM CHLORIDE 0.9 % IV SOLN
385.0000 mg/m2 | Freq: Once | INTRAVENOUS | Status: AC
  Administered 2021-09-26: 800 mg via INTRAVENOUS
  Filled 2021-09-26: qty 40

## 2021-09-26 MED ORDER — FLUOROURACIL CHEMO INJECTION 2.5 GM/50ML
400.0000 mg/m2 | Freq: Once | INTRAVENOUS | Status: AC
  Administered 2021-09-26: 850 mg via INTRAVENOUS
  Filled 2021-09-26: qty 17

## 2021-09-26 MED ORDER — SODIUM CHLORIDE 0.9 % IV SOLN
2400.0000 mg/m2 | INTRAVENOUS | Status: DC
  Administered 2021-09-26: 5000 mg via INTRAVENOUS
  Filled 2021-09-26: qty 100

## 2021-09-26 NOTE — Progress Notes (Signed)
? ? ? ?Hematology/Oncology Consult note ?[place]  ?[phone] [phone] ? ?Patient Care Team: ?Venia Carbon, MD as PCP - General ?[name], RN as Oncology Nurse Navigator ?[name], MD as Consulting Physician (Hematology and Oncology)  ? ?Name of the patient: Cory [name]  ?[id number]  ?05/11/1956  ? ?Date of visit: 09/26/21 ? 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 ?Chief complaint/ Reason for visit-on treatment assessment prior to cycle 3 of FOLFIRI chemotherapy ? ?Heme/Onc history: Patient is a 66 year old male who presented with symptoms of indigestion and heartburn which gradually progressed to dysphagia.  He underwent EGD on 06/20/2020 By Dr. Vicente Males which showed a large nearly obstructing mass in the lower third of the esophagus 35 cm from incisors.  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  PD-l1 <1 %, TMB HIGH ?  ? Presently patient reports dysphagia but is able to eat cereal or soft food.  He has lost about 13 pounds in the last month itself.  Denies any significant pain.  He recently retired after many years of service and is otherwise independent of his ADLs and IADLs.   ?  ?PET CT scan on 06/26/2020 showed circumferential distal esophageal mass with an SUV of 13.3 extending over 5.5 cm. Right lower paratracheal node is 0.6 cm with an SUV of 3.1. Hypermetabolic right gastric lymph nodes including 1.1 cm node with an SUV of 5.8. Numerous hypermetabolic lesions throughout the liver somewhat confluent around the periphery which were hypermetabolic between 8 and 9. Faintly hypermetabolic left adrenal gland ?  ?NGS testing showed high tumor mutational burden CPS score less than 1. CD9-NRG1 fusion. BRAF 581L, CCN E1 gain.  EMLA for fusion of BX [id number], FGF 23 gain, FGF 6 gain, MET R988C, T p53 S1 59F. ERBB2 gain but no other actionable mutations ?  ?Patient currently on first-line  FOLFOX/trastuzumab/Keytruda.  He gets Keytruda every 6 weeks ?  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  Case discussed with neurosurgery and this spot was thought to be difficult to biopsy.    MRI brain in December 2022 showed significant enlargement of this mass to 4 cm with associated edema in the bilateral occipital lobes and marginal hemorrhage along the left aspect of the mass.  Increased subdural hematoma along the left cerebral convexity.  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  Patient received Sharon radiation since single fraction to this mass. ?  ?PET scan inNovember 2022 showed interval resolution of activity in the esophagus as well as multifocal hepatic metastases and gastrohepatic lymph node.  No evidence of metastatic mediastinal lymph nodes but a small new right lower lobe lung nodule without metabolic activity was seen.  Patient therefore continued 5-FU trastuzumab Keytruda with last cycle given on 07/04/2021.  ?  ?EchocardiogramIn November 2022 showed EF of 50 to 55% with low normal ejection fraction but no other abnormalities ?  ?Patient noted to have significant swelling in his right upper extremity.  CT venogram did not show any vascular stenosis and patient was seen by vascular surgery.  Swelling has been attributed secondary to his fracture that he has had in the past following a fall which has not been operated upon and was allowed to heal by secondary intent ?  ?Patient seen at [facility]  Dr. Oralia Rud for second opinion.  They agreed upon Enhertu for second line treatment if he continues to remain HER2 positive on repeat testing.  Patient received 1 cycle of Enhertu but repeat testing subsequently showed her to negativity and was therefore switched to FOLFIRI chemotherapy ? ?Interval history-right arm swelling  has improved significantly after radiation treatment.  He has an area of erythema involving the skin overlying the right scapula as well as in his right axilla.  He is currently completing a course of doxycycline for the same.  Pain is currently well controlled with fentanyl patch and as needed oxycodone.  Bowel movements are regular.  Still has trouble with right eye vision for which she will be seeing her retina specialist next month at [facility] ? ?ECOG PS- 1 ?Pain scale- 2 ?Opioid associated constipation- no ? ?Review of systems- Review of Systems  ?Constitutional:  Positive for malaise/fatigue. Negative for chills, fever and weight loss.  ?HENT:  Negative for congestion, ear discharge and nosebleeds.   ?Eyes:  Negative for blurred vision.  ?Respiratory:  Negative for cough, hemoptysis, sputum production, shortness of breath and wheezing.   ?Cardiovascular:  Negative for chest pain, palpitations, orthopnea and claudication.  ?Gastrointestinal:  Negative for abdominal pain, blood in stool, constipation, diarrhea, heartburn, melena, nausea and vomiting.  ?Genitourinary:  Negative for dysuria, flank pain, frequency, hematuria and urgency.  ?Musculoskeletal:  Negative for back pain, joint pain and myalgias.  ?Skin:  Negative for rash.  ?Neurological:  Negative for dizziness, tingling, focal weakness, seizures, weakness and headaches.  ?Endo/Heme/Allergies:  Does not bruise/bleed easily.  ?Psychiatric/Behavioral:  Negative for depression and suicidal ideas. The patient does not have insomnia.    ? ? ? ?No Known Allergies ? ? ?Past Medical History:  ?Diagnosis Date  ? Esophageal adenocarcinoma ([place]) 06/23/2020  ? Family history of brain cancer   ? Family history of colon cancer   ? Family history of melanoma   ? Family history of stomach cancer   ? GERD (gastroesophageal reflux disease)   ? History of kidney stones   ? Nephrolithiasis   ? [facility]  ? Pancreatitis, acute   ? Personal history of colonic polyps   ? Dr Nicolasa Ducking  ?  Psoriasis   ? ? ? ?Past Surgical History:  ?Procedure Laterality Date  ? COLONOSCOPY    ? COLONOSCOPY WITH PROPOFOL N/A 03/31/2020  ? Procedure: COLONOSCOPY WITH PROPOFOL;  Surgeon: Jonathon Bellows, MD;  Location: [facility] ENDOSCOPY;  Service: Gastroenterology;  Laterality: N/A;  ? ERCP    ? ESOPHAGOGASTRODUODENOSCOPY (EGD) WITH PROPOFOL N/A 06/19/2020  ? Procedure: ESOPHAGOGASTRODUODENOSCOPY (EGD) WITH PROPOFOL;  Surgeon: Jonathon Bellows, MD;  Location[facility] ENDOSCOPY;  Service: Gastroenterology;  Laterality: N/A;  ? groin surgery    ? as a child  ? PORTA CATH INSERTION N/A 06/28/2020  ? Procedure: PORTA CATH INSERTION;  Surgeon: Algernon Huxley, MD;  Location: [facility] CV LAB;  Service: Cardiovascular;  Laterality: N/A;  ? SHOULDER SURGERY    ? UPPER EXTREMITY VENOGRAPHY Right 07/24/2021  ? Procedure: UPPER EXTREMITY VENOGRAPHY;  Surgeon: Katha Cabal, MD;  Location: [facility] CV LAB;  Service: Cardiovascular;  Laterality: Right;  ? ? ?Social History  ? ?Socioeconomic History  ? Marital status: Married  ?  Spouse name: Not on file  ? Number of children: 2  ? Years of education: Not on file  ? Highest education level: Not on file  ?Occupational History  ? Occupation: Filtration and duct work  ?  Comment: textile  [employer]  ?Tobacco Use  ? Smoking status: Former  ?  Types: Cigarettes  ?  Quit date: 07/08/1988  ?  Years since quitting: 33.2  ? Smokeless tobacco: Never  ?Vaping Use  ? Vaping Use: Never used  ?Substance and Sexual Activity  ? Alcohol use: No  ?  Comment: recovering alcoholic for 13 years  ? Drug use: No  ? Sexual activity: Not Currently  ?Other Topics Concern  ? Not on file  ?Social History Narrative  ? Has living will--needs to get notarized  ? Daughter should be health care POA (wife with dementia)  ? Would accept resuscitation  ? Would accept tube feeds depending on the circumstance  ? ?Social Determinants of Health  ? ?Financial Resource Strain: Not on file  ?Food Insecurity: Not on file  ?Transportation  Needs: Not on file  ?Physical Activity: Not on file  ?Stress: Not on file  ?Social Connections: Not on file  ?Intimate Partner Violence: Not on file  ? ? ?Family History  ?Problem Relation Age of Onset  ? Sto

## 2021-09-26 NOTE — Patient Instructions (Signed)
[facility] [place]  Discharge Instructions: ?Thank you for choosing [facility] to provide your oncology and hematology care.  ?If you have a lab appointment with the [facility], please go directly to the [facility] and check in at the registration area. ? ?Wear comfortable clothing and clothing appropriate for easy access to any Portacath or PICC line.  ? ?We strive to give you quality time with your provider. You may need to reschedule your appointment if you arrive late (15 or more minutes).  Arriving late affects you and other patients whose appointments are after yours.  Also, if you miss three or more appointments without notifying the office, you may be dismissed from the clinic at the provider?s discretion.    ?  ?For prescription refill requests, have your pharmacy contact our office and allow 72 hours for refills to be completed.   ? ?Today you received the following chemotherapy and/or immunotherapy agents Adrucil, irinotecan, Leucovorin  ?  ?To help prevent nausea and vomiting after your treatment, we encourage you to take your nausea medication as directed. ? ?BELOW ARE SYMPTOMS THAT SHOULD BE REPORTED IMMEDIATELY: ?*FEVER GREATER THAN 100.4 F (38 ?C) OR HIGHER ?*CHILLS OR SWEATING ?*NAUSEA AND VOMITING THAT IS NOT CONTROLLED WITH YOUR NAUSEA MEDICATION ?*UNUSUAL SHORTNESS OF BREATH ?*UNUSUAL BRUISING OR BLEEDING ?*URINARY PROBLEMS (pain or burning when urinating, or frequent urination) ?*BOWEL PROBLEMS (unusual diarrhea, constipation, pain near the anus) ?TENDERNESS IN MOUTH AND THROAT WITH OR WITHOUT PRESENCE OF ULCERS (sore throat, sores in mouth, or a toothache) ?UNUSUAL RASH, SWELLING OR PAIN  ?UNUSUAL VAGINAL DISCHARGE OR ITCHING  ? ?Items with * indicate a potential emergency and should be followed up as soon as possible or go to the Emergency Department if any problems should occur. ? ?Please show the CHEMOTHERAPY ALERT CARD or IMMUNOTHERAPY ALERT  CARD at check-in to the Emergency Department and triage nurse. ? ?Should you have questions after your visit or need to cancel or reschedule your appointment, please contact [facility]CER [place] [facility] [place]  [phone] and follow the prompts.  Office hours are 8:00 a.m. to 4:30 p.m. Monday - Friday. Please note that voicemails left after 4:00 p.m. may not be returned until the following business day.  We are closed weekends and major holidays. You have access to a nurse at all times for urgent questions. Please call the main number to the clinic [phone] and follow the prompts. ? ?For any non-urgent questions, you may also contact your provider using MyChart. We now offer e-Visits for anyone 73 and older to request care online for non-urgent symptoms. For details visit [email or web address]. ?  ?Also download the MyChart app! Go to the app store, search "MyChart", open the app, select [facility], and log in with your MyChart username and password. ? ?Due to Covid, a mask is required upon entering the hospital/clinic. If you do not have a mask, one will be given to you upon arrival. For doctor visits, patients may have 1 support person aged 18 or older with them. For treatment visits, patients cannot have anyone with them due to current Covid guidelines and our immunocompromised population.  ?

## 2021-09-26 NOTE — Progress Notes (Signed)
Pt  states that he got something in his mouth and he could not eat but got an atb and he is back to normal as of this week. His arm right is looking better , still has some redness on it, still on atb ?

## 2021-09-27 LAB — CEA: CEA: 148 ng/mL — ABNORMAL HIGH (ref 0.0–4.7)

## 2021-09-28 ENCOUNTER — Inpatient Hospital Stay: Payer: Medicare Other

## 2021-09-28 ENCOUNTER — Other Ambulatory Visit: Payer: Self-pay

## 2021-09-28 VITALS — BP 140/62 | HR 75 | Temp 98.3°F | Resp 19

## 2021-09-28 DIAGNOSIS — C159 Malignant neoplasm of esophagus, unspecified: Secondary | ICD-10-CM

## 2021-09-28 DIAGNOSIS — C155 Malignant neoplasm of lower third of esophagus: Secondary | ICD-10-CM | POA: Diagnosis not present

## 2021-09-28 MED ORDER — HEPARIN SOD (PORK) LOCK FLUSH 100 UNIT/ML IV SOLN
500.0000 [IU] | Freq: Once | INTRAVENOUS | Status: AC | PRN
  Filled 2021-09-28: qty 5

## 2021-09-28 MED ORDER — SODIUM CHLORIDE 0.9% FLUSH
10.0000 mL | INTRAVENOUS | Status: DC | PRN
  Administered 2021-09-28: 10 mL
  Filled 2021-09-28: qty 10

## 2021-09-28 MED ORDER — PEGFILGRASTIM-CBQV 6 MG/0.6ML ~~LOC~~ SOSY
6.0000 mg | PREFILLED_SYRINGE | Freq: Once | SUBCUTANEOUS | Status: AC
  Administered 2021-09-28: 6 mg via SUBCUTANEOUS
  Filled 2021-09-28: qty 0.6

## 2021-09-28 MED ORDER — HEPARIN SOD (PORK) LOCK FLUSH 100 UNIT/ML IV SOLN
INTRAVENOUS | Status: AC
  Administered 2021-09-28: 500 [IU]
  Filled 2021-09-28: qty 5

## 2021-10-01 ENCOUNTER — Telehealth: Payer: Self-pay | Admitting: *Deleted

## 2021-10-01 ENCOUNTER — Other Ambulatory Visit: Payer: Self-pay

## 2021-10-01 ENCOUNTER — Telehealth: Payer: Self-pay

## 2021-10-01 ENCOUNTER — Other Ambulatory Visit: Payer: Self-pay | Admitting: *Deleted

## 2021-10-01 DIAGNOSIS — M7989 Other specified soft tissue disorders: Secondary | ICD-10-CM

## 2021-10-01 MED ORDER — DOXYCYCLINE HYCLATE 100 MG PO TABS: 100.0000 mg | ORAL_TABLET | Freq: Two times a day (BID) | ORAL | 0 refills | Status: DC

## 2021-10-01 NOTE — Telephone Encounter (Signed)
Patient called stating that the rash he had on his arm last week is coming back ans is asking that something be done for it. Please advise ? 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

## 2021-10-01 NOTE — Telephone Encounter (Signed)
Referral to [facility] Dermatology has been faxed and receieved fax confirmation. Fax number [phone] ?

## 2021-10-01 NOTE — Telephone Encounter (Signed)
I got a call from pt and he said that when he was on the cream for the redness and swelling that it did not do anything for the symptoms. But when he got the doxycycline it made it go away.  He is now having redness and itchy and wants to get another atb due to he does not want it to get back so bad. I spoke with [name] and he said yes to refill of atb and wants him to have a dermatology appt and we faxed in referral to [facility] dermatology. We will wait for the appt. Pt ok with this plan ?

## 2021-10-04 ENCOUNTER — Other Ambulatory Visit: Payer: Self-pay

## 2021-10-04 ENCOUNTER — Telehealth: Payer: Self-pay | Admitting: *Deleted

## 2021-10-04 ENCOUNTER — Ambulatory Visit
Admission: RE | Admit: 2021-10-04 | Discharge: 2021-10-04 | Disposition: A | Discharge status: Home / Self Care | Payer: Medicare Other | Source: Ambulatory Visit | Attending: Radiation Oncology | Admitting: Radiation Oncology

## 2021-10-04 ENCOUNTER — Other Ambulatory Visit: Payer: Self-pay | Admitting: *Deleted

## 2021-10-04 VITALS — BP 159/71 | HR 84 | Temp 97.8°F | Resp 16 | Ht 71.0 in | Wt 173.6 lb

## 2021-10-04 DIAGNOSIS — C7951 Secondary malignant neoplasm of bone: Secondary | ICD-10-CM | POA: Insufficient documentation

## 2021-10-04 DIAGNOSIS — C7931 Secondary malignant neoplasm of brain: Secondary | ICD-10-CM

## 2021-10-04 DIAGNOSIS — C155 Malignant neoplasm of lower third of esophagus: Secondary | ICD-10-CM | POA: Diagnosis not present

## 2021-10-04 DIAGNOSIS — Z923 Personal history of irradiation: Secondary | ICD-10-CM | POA: Insufficient documentation

## 2021-10-04 MED ORDER — SILVER SULFADIAZINE 1 % EX CREA: 1.0000 "application " | TOPICAL_CREAM | Freq: Two times a day (BID) | CUTANEOUS | 3 refills | Status: DC

## 2021-10-04 NOTE — Telephone Encounter (Signed)
I got a secure chat about appt for dermatology. Pt wants me to clal him back. He is already seeing Dr. Evorn Gong and wants to see him. I called and spoke to pt and said I was told that he could be seen sooner than 3-4 months with the dermatology [facility] dermatology he goes to. He says that that he got an appt and it is in late may.  So then I called his doctor at [facility] dermatology and she says even if he is a pt. Of dr dasher he still does not have an appt til mid may. I said I will call pt and ask him. He says he wants to see Dr Evorn Gong and no where else seems to get him any faster. I called back to [facility] dermatology and got voicemail and left message that the pt wants to see dasher so please give [place] a call for the appt. Left my direct number ?

## 2021-10-04 NOTE — Progress Notes (Signed)
.  si

## 2021-10-04 NOTE — Progress Notes (Signed)
Radiation Oncology ?Follow up Note ? ?Name: Cory [name]   ?Date:   10/04/2021 ?MRN:  [id number] ?DOB: 08-07-1955  ? ? ?This 66 y.o. male presents to the clinic today for 1 month follow-up status post palliative radiation therapy to his right humerus for stage IV esophageal cancer. ? ?REFERRING PROVIDER: Venia Carbon, MD ? ?HPI: Patient is a 66 year old male with known stage IV esophageal cancer.  He is now 1 month out from palliative radiation therapy to his right humerus.  He has had an excellent response as far as pain is concerned he did develop some significant skin reaction although this is outside our treatment field.  This seems to be more diagnostic of possible shingles.  It is contained on his back in his right dermatome.  There are some skin ulceration noted also in the axilla.. ? ?COMPLICATIONS OF TREATMENT: present ? ?FOLLOW UP COMPLIANCE: keeps appointments  ? ?PHYSICAL EXAM: Patient has erythematous rash outside of treatment field on his back.  This is related to the right posterior dermatome.  There is also some slight ulceration in the axilla.  Well-developed well-nourished patient in NAD. HEENT reveals PERLA, EOMI, discs not visualized.  Oral cavity is clear. No oral mucosal lesions are identified. Neck is clear without evidence of cervical or supraclavicular adenopathy. Lungs are clear to A&P. Cardiac examination is essentially unremarkable with regular rate and rhythm without murmur rub or thrill. Abdomen is benign with no organomegaly or masses noted. Motor sensory and DTR levels are equal and symmetric in the upper and lower extremities. Cranial nerves II through XII are grossly intact. Proprioception is intact. No peripheral adenopathy or edema is identified. No motor or sensory levels are noted. Crude visual fields are within normal range. ?BP (!) 159/71 (BP Location: Left Arm, Patient Position: Sitting)   Pulse 84   Temp 97.8 ?F (36.6 ?C) (Tympanic)   Resp 16   Ht '5\' 11"'$  (1.803 m)    Wt 173 lb 9.6 oz (78.7 kg)   BMI 24.21 kg/m?  ?1 ? ?RADIOLOGY RESULTS: No current films for review ? ?PLAN: Present time of starting on some Silvadene cream specially for the area of his axilla.  I am going to turn follow-up care over to medical oncology.  I be happy to reevaluate the patient anytime should further palliative treatment be indicated. ? ?I would like to take this opportunity to thank you for allowing me to participate in the care of your patient.. ?  ? Noreene Filbert, MD ? ?

## 2021-10-05 ENCOUNTER — Telehealth: Payer: Self-pay | Admitting: *Deleted

## 2021-10-05 NOTE — Telephone Encounter (Signed)
Called to see if he got a appt for the pt. May 22 is the date. We had originally sent to [facility] but they can't see him til may. We really wanted pt to be seen sooner but no dermatology has appt sooner thatn may. So pt was already seeing dr dasher and he has appt for may22. Pt.was notified with the appt date and time ?

## 2021-10-10 ENCOUNTER — Encounter: Payer: Self-pay | Admitting: Oncology

## 2021-10-10 ENCOUNTER — Inpatient Hospital Stay: Payer: Medicare Other | Attending: Oncology

## 2021-10-10 ENCOUNTER — Inpatient Hospital Stay: Payer: Medicare Other

## 2021-10-10 ENCOUNTER — Inpatient Hospital Stay (HOSPITAL_BASED_OUTPATIENT_CLINIC_OR_DEPARTMENT_OTHER): Payer: Medicare Other | Admitting: Oncology

## 2021-10-10 VITALS — BP 131/71 | HR 71 | Temp 97.5°F | Resp 16 | Wt 176.4 lb

## 2021-10-10 DIAGNOSIS — Z808 Family history of malignant neoplasm of other organs or systems: Secondary | ICD-10-CM | POA: Insufficient documentation

## 2021-10-10 DIAGNOSIS — Z79899 Other long term (current) drug therapy: Secondary | ICD-10-CM | POA: Insufficient documentation

## 2021-10-10 DIAGNOSIS — Z86010 Personal history of colonic polyps: Secondary | ICD-10-CM | POA: Diagnosis not present

## 2021-10-10 DIAGNOSIS — Z5111 Encounter for antineoplastic chemotherapy: Secondary | ICD-10-CM

## 2021-10-10 DIAGNOSIS — G893 Neoplasm related pain (acute) (chronic): Secondary | ICD-10-CM | POA: Insufficient documentation

## 2021-10-10 DIAGNOSIS — Z800 Family history of malignant neoplasm of digestive organs: Secondary | ICD-10-CM | POA: Insufficient documentation

## 2021-10-10 DIAGNOSIS — Z5189 Encounter for other specified aftercare: Secondary | ICD-10-CM | POA: Diagnosis not present

## 2021-10-10 DIAGNOSIS — R5383 Other fatigue: Secondary | ICD-10-CM | POA: Insufficient documentation

## 2021-10-10 DIAGNOSIS — Z8042 Family history of malignant neoplasm of prostate: Secondary | ICD-10-CM | POA: Diagnosis not present

## 2021-10-10 DIAGNOSIS — C159 Malignant neoplasm of esophagus, unspecified: Secondary | ICD-10-CM | POA: Diagnosis not present

## 2021-10-10 DIAGNOSIS — C155 Malignant neoplasm of lower third of esophagus: Secondary | ICD-10-CM | POA: Diagnosis present

## 2021-10-10 DIAGNOSIS — Z87891 Personal history of nicotine dependence: Secondary | ICD-10-CM | POA: Diagnosis not present

## 2021-10-10 DIAGNOSIS — Z5112 Encounter for antineoplastic immunotherapy: Secondary | ICD-10-CM | POA: Diagnosis present

## 2021-10-10 DIAGNOSIS — Z8249 Family history of ischemic heart disease and other diseases of the circulatory system: Secondary | ICD-10-CM | POA: Diagnosis not present

## 2021-10-10 DIAGNOSIS — Z833 Family history of diabetes mellitus: Secondary | ICD-10-CM | POA: Diagnosis not present

## 2021-10-10 DIAGNOSIS — C787 Secondary malignant neoplasm of liver and intrahepatic bile duct: Secondary | ICD-10-CM | POA: Diagnosis not present

## 2021-10-10 DIAGNOSIS — K219 Gastro-esophageal reflux disease without esophagitis: Secondary | ICD-10-CM | POA: Diagnosis not present

## 2021-10-10 LAB — CBC WITH DIFFERENTIAL/PLATELET
Abs Immature Granulocytes: 0.09 10*3/uL — ABNORMAL HIGH (ref 0.00–0.07)
Basophils Absolute: 0 10*3/uL (ref 0.0–0.1)
Basophils Relative: 0 %
Eosinophils Absolute: 0.1 10*3/uL (ref 0.0–0.5)
Eosinophils Relative: 1 %
HCT: 34.9 % — ABNORMAL LOW (ref 39.0–52.0)
Hemoglobin: 11.1 g/dL — ABNORMAL LOW (ref 13.0–17.0)
Immature Granulocytes: 1 %
Lymphocytes Relative: 12 %
Lymphs Abs: 0.8 10*3/uL (ref 0.7–4.0)
MCH: 30.2 pg (ref 26.0–34.0)
MCHC: 31.8 g/dL (ref 30.0–36.0)
MCV: 94.8 fL (ref 80.0–100.0)
Monocytes Absolute: 0.6 10*3/uL (ref 0.1–1.0)
Monocytes Relative: 8 %
Neutro Abs: 5.4 10*3/uL (ref 1.7–7.7)
Neutrophils Relative %: 78 %
Platelets: 226 10*3/uL (ref 150–400)
RBC: 3.68 MIL/uL — ABNORMAL LOW (ref 4.22–5.81)
RDW: 17.4 % — ABNORMAL HIGH (ref 11.5–15.5)
WBC: 7 10*3/uL (ref 4.0–10.5)
nRBC: 0 % (ref 0.0–0.2)

## 2021-10-10 LAB — COMPREHENSIVE METABOLIC PANEL
ALT: 15 U/L (ref 0–44)
AST: 22 U/L (ref 15–41)
Albumin: 3.6 g/dL (ref 3.5–5.0)
Alkaline Phosphatase: 271 U/L — ABNORMAL HIGH (ref 38–126)
Anion gap: 5 (ref 5–15)
BUN: 8 mg/dL (ref 8–23)
CO2: 25 mmol/L (ref 22–32)
Calcium: 8.8 mg/dL — ABNORMAL LOW (ref 8.9–10.3)
Chloride: 107 mmol/L (ref 98–111)
Creatinine, Ser: 0.68 mg/dL (ref 0.61–1.24)
GFR, Estimated: >60 mL/min (ref >60)
Glucose, Bld: 119 mg/dL — ABNORMAL HIGH (ref 70–99)
Potassium: 3.5 mmol/L (ref 3.5–5.1)
Sodium: 137 mmol/L (ref 135–145)
Total Bilirubin: 0.9 mg/dL (ref 0.3–1.2)
Total Protein: 6.2 g/dL — ABNORMAL LOW (ref 6.5–8.1)

## 2021-10-10 MED ORDER — PALONOSETRON HCL INJECTION 0.25 MG/5ML
0.2500 mg | Freq: Once | INTRAVENOUS | Status: AC
  Administered 2021-10-10: 0.25 mg via INTRAVENOUS
  Filled 2021-10-10: qty 5

## 2021-10-10 MED ORDER — SODIUM CHLORIDE 0.9% FLUSH
10.0000 mL | Freq: Once | INTRAVENOUS | Status: AC
  Administered 2021-10-10: 10 mL via INTRAVENOUS
  Filled 2021-10-10: qty 10

## 2021-10-10 MED ORDER — SODIUM CHLORIDE 0.9 % IV SOLN
10.0000 mg | Freq: Once | INTRAVENOUS | Status: AC
  Administered 2021-10-10: 10 mg via INTRAVENOUS
  Filled 2021-10-10: qty 10

## 2021-10-10 MED ORDER — SODIUM CHLORIDE 0.9 % IV SOLN
180.0000 mg/m2 | Freq: Once | INTRAVENOUS | Status: AC
  Administered 2021-10-10: 380 mg via INTRAVENOUS
  Filled 2021-10-10: qty 19
  Filled 2021-10-10: qty 15

## 2021-10-10 MED ORDER — SODIUM CHLORIDE 0.9 % IV SOLN
2400.0000 mg/m2 | INTRAVENOUS | Status: DC
  Administered 2021-10-10: 5000 mg via INTRAVENOUS
  Filled 2021-10-10 (×2): qty 100

## 2021-10-10 MED ORDER — HEPARIN SOD (PORK) LOCK FLUSH 100 UNIT/ML IV SOLN
500.0000 [IU] | Freq: Once | INTRAVENOUS | Status: DC
  Filled 2021-10-10: qty 5

## 2021-10-10 MED ORDER — FLUOROURACIL CHEMO INJECTION 2.5 GM/50ML
400.0000 mg/m2 | Freq: Once | INTRAVENOUS | Status: AC
  Administered 2021-10-10: 850 mg via INTRAVENOUS
  Filled 2021-10-10 (×2): qty 17

## 2021-10-10 MED ORDER — SODIUM CHLORIDE 0.9 % IV SOLN
Freq: Once | INTRAVENOUS | Status: AC
  Filled 2021-10-10: qty 250

## 2021-10-10 MED ORDER — SODIUM CHLORIDE 0.9 % IV SOLN
400.0000 mg/m2 | Freq: Once | INTRAVENOUS | Status: AC
  Administered 2021-10-10: 832 mg via INTRAVENOUS
  Filled 2021-10-10: qty 41.6

## 2021-10-10 MED ORDER — ATROPINE SULFATE 1 MG/ML IV SOLN
0.5000 mg | Freq: Once | INTRAVENOUS | Status: AC | PRN
  Administered 2021-10-10: 0.5 mg via INTRAVENOUS
  Filled 2021-10-10: qty 1

## 2021-10-10 NOTE — Progress Notes (Signed)
Pt stated that in between his toes and on top of his feet it has becomes very itchy; sx started about three weeks ago.  ?

## 2021-10-10 NOTE — Progress Notes (Signed)
? ? ? ?Hematology/Oncology Consult note ?[place]  ?Telephone:([phone]) [phone] [phone] ? ?Patient Care Team: ?Venia Carbon, MD as PCP - General ?[name], RN as Oncology Nurse Navigator ?[name], MD as Consulting Physician (Hematology and Oncology)  ? ?Name of the patient: Cory [name]  ?[id number]  ?14-Sep-1955  ? ?Date of visit: 10/10/21 ? ?Diagnosis-  metastatic esophageal cancer with liver metastases ? ?Chief complaint/ Reason for visit-on treatment assessment prior to cycle 4 of FOLFIRI chemotherapy ? ?Heme/Onc history: Patient is a 66 year old male with history of metastatic esophageal adenocarcinoma s/p progression on first-line FOLFOX trastuzumab Keytruda chemotherapy.NGS testing showed high tumor mutational burden CPS score less than 1. CD9-NRG1 fusion. BRAF 581L, CCN E1 gain.  EMLA for fusion of BX [id number], FGF 23 gain, FGF 6 gain, MET R988C, T p53 S1 34F. ERBB2 gain but no other actionable mutations repeat NGS testing at progression showed loss of HER2 expression.  He was therefore switched to FOLFIRI chemotherapy. ? 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  Case discussed with neurosurgery and this spot was thought to be difficult to biopsy.    MRI brain in December 2022 showed significant enlargement of this mass to 4 cm with associated edema in the bilateral occipital lobes and marginal hemorrhage along the left aspect of the mass.  Increased subdural hematoma along the left cerebral convexity.  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  Patient received [place] radiation since single fraction to this mass.  Patient also has an intra choroidal met for which he will be seeing retina specialist at [facility] ? ?Also noted to have fracture of the right upper extremity possibly  pathologic and underwent palliative radiation for the same. ?  ? ?Interval history-patient is doing well presently.  Right upper extremity swelling as well as leg swelling has resolved.  Appetite is fair.  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 ? ?ECOG PS- 1 ?Pain scale- 2 ?Opioid associated constipation- no ? ?Review of systems- Review of Systems  ?Constitutional:  Positive for malaise/fatigue. Negative for chills, fever and weight loss.  ?HENT:  Negative for congestion, ear discharge and nosebleeds.   ?Eyes:  Negative for blurred vision.  ?Respiratory:  Negative for cough, hemoptysis, sputum production, shortness of breath and wheezing.   ?Cardiovascular:  Negative for chest pain, palpitations, orthopnea and claudication.  ?Gastrointestinal:  Negative for abdominal pain, blood in stool, constipation, diarrhea, heartburn, melena, nausea and vomiting.  ?Genitourinary:  Negative for dysuria, flank pain, frequency, hematuria and urgency.  ?Musculoskeletal:  Negative for back pain, joint pain and myalgias.  ?Skin:  Negative for rash.  ?Neurological:  Negative for dizziness, tingling, focal weakness, seizures, weakness and headaches.  ?Endo/Heme/Allergies:  Does not bruise/bleed easily.  ?Psychiatric/Behavioral:  Negative for depression and suicidal ideas. The patient does not have insomnia.    ? ? ?No Known Allergies ? ? ?Past Medical History:  ?Diagnosis Date  ? Esophageal adenocarcinoma ([place]) 06/23/2020  ? Family history of brain cancer   ? Family history of colon cancer   ? Family history of melanoma   ? Family history of stomach cancer   ? GERD (gastroesophageal reflux disease)   ? History of kidney stones   ? Nephrolithiasis   ? [facility]  ? Pancreatitis, acute   ? Personal history of  colonic polyps   ? Dr Nicolasa Ducking  ? Psoriasis   ? ? ? ?Past Surgical History:  ?Procedure Laterality Date  ? COLONOSCOPY    ? COLONOSCOPY WITH PROPOFOL N/A 03/31/2020  ? Procedure: COLONOSCOPY WITH PROPOFOL;  Surgeon:  Jonathon Bellows, MD;  Location: [facility] ENDOSCOPY;  Service: Gastroenterology;  Laterality: N/A;  ? ERCP    ? ESOPHAGOGASTRODUODENOSCOPY (EGD) WITH PROPOFOL N/A 06/19/2020  ? Procedure: ESOPHAGOGASTRODUODENOSCOPY (EGD) WITH PROPOFOL;  Surgeon: Jonathon Bellows, MD;  Location: [facility] ENDOSCOPY;  Service: Gastroenterology;  Laterality: N/A;  ? groin surgery    ? as a child  ? PORTA CATH INSERTION N/A 06/28/2020  ? Procedure: PORTA CATH INSERTION;  Surgeon: Algernon Huxley, MD;  Location: [facility] LAB;  Service: Cardiovascular;  Laterality: N/A;  ? SHOULDER SURGERY    ? UPPER EXTREMITY VENOGRAPHY Right 07/24/2021  ? Procedure: UPPER EXTREMITY VENOGRAPHY;  Surgeon: Katha Cabal, MD;  Location: [facility];  Service: Cardiovascular;  Laterality: Right;  ? ? ?Social History  ? ?Socioeconomic History  ? Marital status: Married  ?  Spouse name: Not on file  ? Number of children: 2  ? Years of education: Not on file  ? Highest education level: Not on file  ?Occupational History  ? 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 Smoking status: Former  ?  Types: Cigarettes  ?  Quit date: 07/08/1988  ?  Years since quitting: 33.2  ? Smokeless tobacco: Never  ?Vaping Use  ? Vaping Use: Never used  ?Substance and Sexual Activity  ? Alcohol use: No  ?  Comment: recovering alcoholic for 13 years  ? Drug use: No  ? Sexual activity: Not Currently  ?Other Topics Concern  ? Not on file  ?Social History Narrative  ? Has living will--needs to get notarized  ? Daughter should be health care POA (wife with dementia)  ? Would accept resuscitation  ? Would accept tube feeds depending on the circumstance  ? ?Social Determinants of Health  ? ?Financial Resource Strain: Not on file  ?Food Insecurity: Not on file  ?Transportation Needs: Not on file  ?Physical Activity: Not on file  ?Stress: Not on file  ?Social Connections: Not on file  ?Intimate Partner Violence: Not on file  ? ? ?Family History  ?Problem  Relation Age of Onset  ? Stomach cancer Mother   ? Diabetes Brother   ? Hypertension Brother   ? Hypertension Father   ? Prostate cancer Father   ? Colon cancer Sister   ? Cancer Other   ?     either cervical or ovarian, d. 8s  ? Brain cancer Sister   ? ? ? ?Current Outpatient Medications:  ?  doxycycline (VIBRA-TABS) 100 MG tablet, Take 1 tablet (100 mg total) by mouth 2 (two) times daily., Disp: 20 tablet, Rfl: 0 ?  fentaNYL (DURAGESIC) 25 MCG/HR, Place 1 patch onto the skin every 3 (three) days., Disp: 10 patch, Rfl: 0 ?  lidocaine-prilocaine (EMLA) cream, Apply 1 application topically as needed. Apply small amount to port site at least 1 hour prior to it being accessed, cover with plastic wrap, Disp: 30 g, Rfl: 3 ?  Oxycodone HCl 10 MG TABS, Take 1 tablet (10 mg total) by mouth every 4 (four) hours as needed., Disp: 120 tablet, Rfl: 0 ?  pantoprazole (PROTONIX) 40 MG tablet, TAKE 1 TABLET BY MOUTH TWICE A DAY BEFORE MEALS, Disp: 60 tablet, Rfl:  11 ?  silver sulfADIAZINE (SILVADENE) 1 % cream, Apply 1 application. topically 2 (two) times daily., Disp: 50 g, Rfl: 3 ?  silver sulfADIAZINE (SILVADENE) 1 % cream, Apply 1 application. topically 2 (two) times daily., Disp: 50 g, Rfl: 3 ?  loperamide (IMODIUM) 2 MG capsule, Take 1 capsule (2 mg total) by mouth as needed. Take 2 at diarrhea onset , then 1 every 2hr until 12hrs with no BM. May take 2 every 4hrs at night. If diarrhea recurs repeat. (Patient not taking: Reported on 09/12/2021), Disp: 30 capsule, Rfl: 3 ?  prochlorperazine (COMPAZINE) 10 MG tablet, Take 1 tablet (10 mg total) by mouth every 6 (six) hours as needed for nausea or vomiting. (Patient not taking: Reported on 10/10/2021), Disp: 30 tablet, Rfl: 0 ?No current facility-administered medications for this visit. ? ?Facility-Administered Medications Ordered in Other Visits:  ?  fluorouracil (ADRUCIL) 5,000 mg in sodium chloride 0.9 % 150 mL chemo infusion, 2,400 mg/m2 (Treatment Plan Recorded), Intravenous,  1 day or 1 dose, [name], MD ?  fluorouracil (ADRUCIL) chemo injection 850 mg, 400 mg/m2 (Treatment Plan Recorded), Intravenous, Once, [name], MD ?  heparin lock flush 100 unit/mL, 500 Unit

## 2021-10-11 LAB — CEA: CEA: 87.1 ng/mL — ABNORMAL HIGH (ref 0.0–4.7)

## 2021-10-12 ENCOUNTER — Inpatient Hospital Stay: Payer: Medicare Other

## 2021-10-12 DIAGNOSIS — Z5112 Encounter for antineoplastic immunotherapy: Secondary | ICD-10-CM | POA: Diagnosis not present

## 2021-10-12 DIAGNOSIS — C159 Malignant neoplasm of esophagus, unspecified: Secondary | ICD-10-CM

## 2021-10-12 MED ORDER — HEPARIN SOD (PORK) LOCK FLUSH 100 UNIT/ML IV SOLN
500.0000 [IU] | Freq: Once | INTRAVENOUS | Status: AC | PRN
  Administered 2021-10-12: 500 [IU]
  Filled 2021-10-12: qty 5

## 2021-10-12 MED ORDER — PEGFILGRASTIM-CBQV 6 MG/0.6ML ~~LOC~~ SOSY
6.0000 mg | PREFILLED_SYRINGE | Freq: Once | SUBCUTANEOUS | Status: AC
  Administered 2021-10-12: 6 mg via SUBCUTANEOUS

## 2021-10-12 MED ORDER — SODIUM CHLORIDE 0.9% FLUSH
10.0000 mL | INTRAVENOUS | Status: DC | PRN
  Administered 2021-10-12: 10 mL
  Filled 2021-10-12: qty 10

## 2021-10-17 ENCOUNTER — Telehealth: Payer: Self-pay | Admitting: *Deleted

## 2021-10-17 ENCOUNTER — Ambulatory Visit: Payer: Medicare Other | Admitting: Occupational Therapy

## 2021-10-17 NOTE — Telephone Encounter (Signed)
Pt says that he feels like he has a sore mouth. He does not feel any sores but very tender, someone told him to use baking soda with water and then salt water rinse. It helps some but it still is hard to eat due to this. I spoke to pt and dr [name] said we can try MMW-magic mouthwash. I have faxed it to  [facility]. Pt aware. ?

## 2021-10-23 ENCOUNTER — Encounter: Payer: Self-pay | Admitting: Oncology

## 2021-10-23 ENCOUNTER — Inpatient Hospital Stay: Payer: Medicare Other

## 2021-10-23 ENCOUNTER — Inpatient Hospital Stay (HOSPITAL_BASED_OUTPATIENT_CLINIC_OR_DEPARTMENT_OTHER): Payer: Medicare Other | Admitting: Oncology

## 2021-10-23 VITALS — BP 134/72 | HR 82 | Temp 98.0°F | Resp 16 | Ht 71.0 in | Wt 171.6 lb

## 2021-10-23 DIAGNOSIS — Z5111 Encounter for antineoplastic chemotherapy: Secondary | ICD-10-CM

## 2021-10-23 DIAGNOSIS — C159 Malignant neoplasm of esophagus, unspecified: Secondary | ICD-10-CM

## 2021-10-23 DIAGNOSIS — Z5112 Encounter for antineoplastic immunotherapy: Secondary | ICD-10-CM | POA: Diagnosis not present

## 2021-10-23 DIAGNOSIS — G893 Neoplasm related pain (acute) (chronic): Secondary | ICD-10-CM

## 2021-10-23 LAB — CBC WITH DIFFERENTIAL/PLATELET
Abs Immature Granulocytes: 0.21 10*3/uL — ABNORMAL HIGH (ref 0.00–0.07)
Basophils Absolute: 0 10*3/uL (ref 0.0–0.1)
Basophils Relative: 0 %
Eosinophils Absolute: 0.1 10*3/uL (ref 0.0–0.5)
Eosinophils Relative: 1 %
HCT: 36.7 % — ABNORMAL LOW (ref 39.0–52.0)
Hemoglobin: 11.6 g/dL — ABNORMAL LOW (ref 13.0–17.0)
Immature Granulocytes: 5 %
Lymphocytes Relative: 21 %
Lymphs Abs: 1 10*3/uL (ref 0.7–4.0)
MCH: 30.1 pg (ref 26.0–34.0)
MCHC: 31.6 g/dL (ref 30.0–36.0)
MCV: 95.3 fL (ref 80.0–100.0)
Monocytes Absolute: 0.5 10*3/uL (ref 0.1–1.0)
Monocytes Relative: 10 %
Neutro Abs: 2.9 10*3/uL (ref 1.7–7.7)
Neutrophils Relative %: 63 %
Platelets: 206 10*3/uL (ref 150–400)
RBC: 3.85 MIL/uL — ABNORMAL LOW (ref 4.22–5.81)
RDW: 16.9 % — ABNORMAL HIGH (ref 11.5–15.5)
WBC: 4.5 10*3/uL (ref 4.0–10.5)
nRBC: 0 % (ref 0.0–0.2)

## 2021-10-23 LAB — COMPREHENSIVE METABOLIC PANEL
ALT: 16 U/L (ref 0–44)
AST: 25 U/L (ref 15–41)
Albumin: 3.7 g/dL (ref 3.5–5.0)
Alkaline Phosphatase: 240 U/L — ABNORMAL HIGH (ref 38–126)
Anion gap: 7 (ref 5–15)
BUN: 11 mg/dL (ref 8–23)
CO2: 24 mmol/L (ref 22–32)
Calcium: 8.8 mg/dL — ABNORMAL LOW (ref 8.9–10.3)
Chloride: 106 mmol/L (ref 98–111)
Creatinine, Ser: 0.75 mg/dL (ref 0.61–1.24)
GFR, Estimated: >60 mL/min (ref >60)
Glucose, Bld: 132 mg/dL — ABNORMAL HIGH (ref 70–99)
Potassium: 3.7 mmol/L (ref 3.5–5.1)
Sodium: 137 mmol/L (ref 135–145)
Total Bilirubin: 0.7 mg/dL (ref 0.3–1.2)
Total Protein: 6.3 g/dL — ABNORMAL LOW (ref 6.5–8.1)

## 2021-10-23 MED ORDER — PALONOSETRON HCL INJECTION 0.25 MG/5ML
0.2500 mg | Freq: Once | INTRAVENOUS | Status: AC
  Administered 2021-10-23: 0.25 mg via INTRAVENOUS
  Filled 2021-10-23: qty 5

## 2021-10-23 MED ORDER — SODIUM CHLORIDE 0.9 % IV SOLN
Freq: Once | INTRAVENOUS | Status: AC
  Filled 2021-10-23: qty 250

## 2021-10-23 MED ORDER — SODIUM CHLORIDE 0.9 % IV SOLN
2400.0000 mg/m2 | INTRAVENOUS | Status: DC
  Administered 2021-10-23: 5000 mg via INTRAVENOUS
  Filled 2021-10-23: qty 100

## 2021-10-23 MED ORDER — FLUOROURACIL CHEMO INJECTION 2.5 GM/50ML
400.0000 mg/m2 | Freq: Once | INTRAVENOUS | Status: AC
  Administered 2021-10-23: 850 mg via INTRAVENOUS
  Filled 2021-10-23: qty 17

## 2021-10-23 MED ORDER — SODIUM CHLORIDE 0.9 % IV SOLN
180.0000 mg/m2 | Freq: Once | INTRAVENOUS | Status: AC
  Administered 2021-10-23: 380 mg via INTRAVENOUS
  Filled 2021-10-23: qty 15

## 2021-10-23 MED ORDER — SODIUM CHLORIDE 0.9 % IV SOLN
10.0000 mg | Freq: Once | INTRAVENOUS | Status: AC
  Administered 2021-10-23: 10 mg via INTRAVENOUS
  Filled 2021-10-23: qty 10

## 2021-10-23 MED ORDER — SODIUM CHLORIDE 0.9 % IV SOLN
400.0000 mg/m2 | Freq: Once | INTRAVENOUS | Status: AC
  Administered 2021-10-23: 832 mg via INTRAVENOUS
  Filled 2021-10-23: qty 41.6

## 2021-10-23 MED ORDER — ATROPINE SULFATE 1 MG/ML IV SOLN
0.5000 mg | Freq: Once | INTRAVENOUS | Status: AC | PRN
  Administered 2021-10-23: 0.5 mg via INTRAVENOUS
  Filled 2021-10-23: qty 1

## 2021-10-23 NOTE — Progress Notes (Signed)
? ? ? ?Hematology/Oncology Consult note ?[place]  ?Telephone:([phone]) [phone] [phone] ? ?Patient Care Team: ?Venia Carbon, MD as PCP - General ?[name], RN as Oncology Nurse Navigator ?[name], MD as Consulting Physician (Hematology and Oncology)  ? ?Name of the patient: Cory [name]  ?[id number]  ?03-27-1956  ? ?Date of visit: 10/23/21 ? 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 ?Chief complaint/ Reason for visit-on treatment assessment prior to cycle 5 of FOLFIRI chemotherapy ? ?Heme/Onc history: Patient is a 66 year old male with history of metastatic esophageal adenocarcinoma s/p progression on first-line FOLFOX trastuzumab Keytruda chemotherapy.NGS testing showed high tumor mutational burden CPS score less than 1. CD9-NRG1 fusion. BRAF 581L, CCN E1 gain.  EMLA for fusion of BX [id number], FGF 23 gain, FGF 6 gain, MET R988C, T p53 S1 48F. ERBB2 gain but no other actionable mutations repeat NGS testing at progression showed loss of HER2 expression.  He was therefore switched to FOLFIRI chemotherapy. ?  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  Case discussed with neurosurgery and this spot was thought to be difficult to biopsy.    MRI brain in December 2022 showed significant enlargement of this mass to 4 cm with associated edema in the bilateral occipital lobes and marginal hemorrhage along the left aspect of the mass.  Increased subdural hematoma along the left cerebral convexity.  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  Patient received [place] radiation since single fraction to this mass.  Patient also has an intra choroidal met for which he will be seeing retina specialist at [facility] ?  ?Also noted to have fracture of the right upper extremity possibly  pathologic and underwent palliative radiation for the same. ?  ?Disease progression in February 2023 and patient was switched to FOLFIRI chemotherapy ? ?Interval history-he is doing well overall and tolerating chemotherapy well without any significant side effects.  He does get symptoms of thrush which last for a few days after chemotherapy but resolves with mouthwash.  Pain is currently well controlled with fentanyl patch and oxycodone ? ?ECOG PS- 1 ?Pain scale- 2 ?Opioid associated constipation- no ? ?Review of systems- Review of Systems  ?Constitutional:  Positive for malaise/fatigue. Negative for chills, fever and weight loss.  ?HENT:  Negative for congestion, ear discharge and nosebleeds.   ?Eyes:  Negative for blurred vision.  ?Respiratory:  Negative for cough, hemoptysis, sputum production, shortness of breath and wheezing.   ?Cardiovascular:  Negative for chest pain, palpitations, orthopnea and claudication.  ?Gastrointestinal:  Negative for abdominal pain, blood in stool, constipation, diarrhea, heartburn, melena, nausea and vomiting.  ?Genitourinary:  Negative for dysuria, flank pain, frequency, hematuria and urgency.  ?Musculoskeletal:  Negative for back pain, joint pain and myalgias.  ?Skin:  Negative for rash.  ?Neurological:  Negative for dizziness, tingling, focal weakness, seizures, weakness and headaches.  ?Endo/Heme/Allergies:  Does not bruise/bleed easily.  ?Psychiatric/Behavioral:  Negative for depression and suicidal ideas. The patient does not have insomnia.    ? ? ? ?No Known Allergies ? ? ?Past Medical History:  ?Diagnosis Date  ? Esophageal adenocarcinoma ([place]) 06/23/2020  ? Family history of brain cancer   ? Family history of colon cancer   ? Family history of melanoma   ? Family history of stomach cancer   ? GERD (gastroesophageal  reflux disease)   ? History of kidney stones   ? Nephrolithiasis   ? [facility]  ? Pancreatitis, acute   ? Personal history of colonic polyps   ? Dr Nicolasa Ducking  ?  Psoriasis   ? ? ? ?Past Surgical History:  ?Procedure Laterality Date  ? COLONOSCOPY    ? COLONOSCOPY WITH PROPOFOL N/A 03/31/2020  ? Procedure: COLONOSCOPY WITH PROPOFOL;  Surgeon: Jonathon Bellows, MD;  Location: [facility] ENDOSCOPY;  Service: Gastroenterology;  Laterality: N/A;  ? ERCP    ? ESOPHAGOGASTRODUODENOSCOPY (EGD) WITH PROPOFOL N/A 06/19/2020  ? Procedure: ESOPHAGOGASTRODUODENOSCOPY (EGD) WITH PROPOFOL;  Surgeon: Jonathon Bellows, MD;  Location: [facility] ENDOSCOPY;  Service: Gastroenterology;  Laterality: N/A;  ? groin surgery    ? as a child  ? PORTA CATH INSERTION N/A 06/28/2020  ? Procedure: PORTA CATH INSERTION;  Surgeon: Algernon Huxley, MD;  Location: [facility];  Service: Cardiovascular;  Laterality: N/A;  ? SHOULDER SURGERY    ? UPPER EXTREMITY VENOGRAPHY Right 07/24/2021  ? Procedure: UPPER EXTREMITY VENOGRAPHY;  Surgeon: Katha Cabal, MD;  Location: [facility];  Service: Cardiovascular;  Laterality: Right;  ? ? ?Social History  ? ?Socioeconomic History  ? Marital status: Married  ?  Spouse name: Not on file  ? Number of children: 2  ? Years of education: Not on file  ? Highest education level: Not on file  ?Occupational History  ? 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 Smoking status: Former  ?  Types: Cigarettes  ?  Quit date: 07/08/1988  ?  Years since quitting: 33.3  ? Smokeless tobacco: Never  ?Vaping Use  ? Vaping Use: Never used  ?Substance and Sexual Activity  ? Alcohol use: No  ?  Comment: recovering alcoholic for 13 years  ? Drug use: No  ? Sexual activity: Not Currently  ?Other Topics Concern  ? Not on file  ?Social History Narrative  ? Has living will--needs to get notarized  ? Daughter should be health care POA (wife with dementia)  ? Would accept resuscitation  ? Would accept tube feeds depending on the circumstance  ? ?Social Determinants of Health  ? ?Financial Resource Strain: Not on file  ?Food Insecurity: Not on file  ?Transportation  Needs: Not on file  ?Physical Activity: Not on file  ?Stress: Not on file  ?Social Connections: Not on file  ?Intimate Partner Violence: Not on file  ? ? ?Family History  ?Problem Relation Age of Onset  ? Stomach cancer Mother   ? Diabetes Brother   ? Hypertension Brother   ? Hypertension Father   ? Prostate cancer Father   ? Colon cancer Sister   ? Cancer Other   ?     either cervical or ovarian, d. 3s  ? Brain cancer Sister   ? ? ? ?Current Outpatient Medications:  ?  fentaNYL (DURAGESIC) 25 MCG/HR, Place 1 patch onto the skin every 3 (three) days., Disp: 10 patch, Rfl: 0 ?  lidocaine-prilocaine (EMLA) cream, Apply 1 application topically as needed. Apply small amount to port site at least 1 hour prior to it being accessed, cover with plastic wrap, Disp: 30 g, Rfl: 3 ?  Oxycodone HCl 10 MG TABS, Take 1 tablet (10 mg total) by mouth every 4 (four) hours as needed., Disp: 120 tablet, Rfl: 0 ?  pantoprazole (PROTONIX) 40 MG tablet, TAKE 1 TABLET BY MOUTH TWICE A DAY BEFORE MEALS, Disp:  60 tablet, Rfl: 11 ?  doxycycline (VIBRA-TABS) 100 MG tablet, Take 1 tablet (100 mg total) by mouth 2 (two) times daily., Disp: 20 tablet, Rfl: 0 ?  loperamide (IMODIUM) 2 MG capsule, Take 1 capsule (2 mg total) by mouth as needed. Take 2 at diarrhea onset , then 1 every 2hr until 12hrs with no BM. May take 2 every 4hrs at night. If diarrhea recurs repeat. (Patient not taking: Reported on 09/12/2021), Disp: 30 capsule, Rfl: 3 ?  prochlorperazine (COMPAZINE) 10 MG tablet, Take 1 tablet (10 mg total) by mouth every 6 (six) hours as needed for nausea or vomiting. (Patient not taking: Reported on 10/10/2021), Disp: 30 tablet, Rfl: 0 ?  silver sulfADIAZINE (SILVADENE) 1 % cream, Apply 1 application. topically 2 (two) times daily. (Patient not taking: Reported on 10/23/2021), Disp: 50 g, Rfl: 3 ?  silver sulfADIAZINE (SILVADENE) 1 % cream, Apply 1 application. topically 2 (two) times daily., Disp: 50 g, Rfl: 3 ?No current facility-administered  medications for this visit. ? ?Facility-Administered Medications Ordered in Other Visits:  ?  fluorouracil (ADRUCIL) 5,000 mg in sodium chloride 0.9 % 150 mL chemo infusion, 2,400 mg/m2 (Treatment Plan Recor

## 2021-10-23 NOTE — Progress Notes (Signed)
Pt fell going up steps at house in the rain- hit right shoulder, scuffed 2 knuckles on right hand and hit his head on right side-nothing hurting. Had sores in his mouth and could not eat. He got MMW and then all is good and he is eating back to normal since sat.  ?

## 2021-10-23 NOTE — Patient Instructions (Signed)
[facility] CANCER CTR AT [place]  Discharge Instructions: ?Thank you for choosing [facility] to provide your oncology and hematology care.  ?If you have a lab appointment with the [facility], please go directly to the [facility] and check in at the registration area. ? ?Wear comfortable clothing and clothing appropriate for easy access to any Portacath or PICC line.  ? ?We strive to give you quality time with your provider. You may need to reschedule your appointment if you arrive late (15 or more minutes).  Arriving late affects you and other patients whose appointments are after yours.  Also, if you miss three or more appointments without notifying the office, you may be dismissed from the clinic at the provider?s discretion.    ?  ?For prescription refill requests, have your pharmacy contact our office and allow 72 hours for refills to be completed.   ? ?Today you received the following chemotherapy and/or immunotherapy agents Irinotecan, leucovorin, Adrucil  ?  ?To help prevent nausea and vomiting after your treatment, we encourage you to take your nausea medication as directed. ? ?BELOW ARE SYMPTOMS THAT SHOULD BE REPORTED IMMEDIATELY: ?*FEVER GREATER THAN 100.4 F (38 ?C) OR HIGHER ?*CHILLS OR SWEATING ?*NAUSEA AND VOMITING THAT IS NOT CONTROLLED WITH YOUR NAUSEA MEDICATION ?*UNUSUAL SHORTNESS OF BREATH ?*UNUSUAL BRUISING OR BLEEDING ?*URINARY PROBLEMS (pain or burning when urinating, or frequent urination) ?*BOWEL PROBLEMS (unusual diarrhea, constipation, pain near the anus) ?TENDERNESS IN MOUTH AND THROAT WITH OR WITHOUT PRESENCE OF ULCERS (sore throat, sores in mouth, or a toothache) ?UNUSUAL RASH, SWELLING OR PAIN  ?UNUSUAL VAGINAL DISCHARGE OR ITCHING  ? ?Items with * indicate a potential emergency and should be followed up as soon as possible or go to the Emergency Department if any problems should occur. ? ?Please show the CHEMOTHERAPY ALERT CARD or IMMUNOTHERAPY ALERT  CARD at check-in to the Emergency Department and triage nurse. ? ?Should you have questions after your visit or need to cancel or reschedule your appointment, please contact [facility] [place] AT [place]  [phone] and follow the prompts.  Office hours are 8:00 a.m. to 4:30 p.m. Monday - Friday. Please note that voicemails left after 4:00 p.m. may not be returned until the following business day.  We are closed weekends and major holidays. You have access to a nurse at all times for urgent questions. Please call the main number to the clinic [phone] and follow the prompts. ? ?For any non-urgent questions, you may also contact your provider using MyChart. We now offer e-Visits for anyone 40 and older to request care online for non-urgent symptoms. For details visit [email or web address]. ?  ?Also download the MyChart app! Go to the app store, search "MyChart", open the app, select [facility], and log in with your MyChart username and password. ? ?Due to Covid, a mask is required upon entering the hospital/clinic. If you do not have a mask, one will be given to you upon arrival. For doctor visits, patients may have 1 support person aged 40 or older with them. For treatment visits, patients cannot have anyone with them due to current Covid guidelines and our immunocompromised population.  ?

## 2021-10-24 ENCOUNTER — Ambulatory Visit: Payer: Medicare Other | Attending: Internal Medicine | Admitting: Occupational Therapy

## 2021-10-24 DIAGNOSIS — I890 Lymphedema, not elsewhere classified: Secondary | ICD-10-CM | POA: Insufficient documentation

## 2021-10-24 LAB — CEA: CEA: 75.8 ng/mL — ABNORMAL HIGH (ref 0.0–4.7)

## 2021-10-24 NOTE — Therapy (Signed)
[facility] ?[address] ?[place], [place], [address] ?Phone: [phone]   Fax:  [phone] ? ?Occupational Therapy Screen: ? ?Patient Details  ?Name: Cory [name] ?MRN: [id number] ?Date of Birth: 1956/07/06 ?No data recorded ? ?Encounter Date: 10/24/2021 ? ? OT End of Session - 10/24/21 1737   ? ? Visit Number 0   ? ?  ?  ? ?  ? ? ?Past Medical History:  ?Diagnosis Date  ? Esophageal adenocarcinoma ([place]) 06/23/2020  ? Family history of brain cancer   ? Family history of colon cancer   ? Family history of melanoma   ? Family history of stomach cancer   ? GERD (gastroesophageal reflux disease)   ? History of kidney stones   ? Nephrolithiasis   ? [facility]  ? Pancreatitis, acute   ? Personal history of colonic polyps   ? Dr Nicolasa Ducking  ? Psoriasis   ? ? ?Past Surgical History:  ?Procedure Laterality Date  ? COLONOSCOPY    ? COLONOSCOPY WITH PROPOFOL N/A 03/31/2020  ? Procedure: COLONOSCOPY WITH PROPOFOL;  Surgeon: Jonathon Bellows, MD;  Location: [facility] ENDOSCOPY;  Service: Gastroenterology;  Laterality: N/A;  ? ERCP    ? ESOPHAGOGASTRODUODENOSCOPY (EGD) WITH PROPOFOL N/A 06/19/2020  ? Procedure: ESOPHAGOGASTRODUODENOSCOPY (EGD) WITH PROPOFOL;  Surgeon: Jonathon Bellows, MD;  Location: [facility] ENDOSCOPY;  Service: Gastroenterology;  Laterality: N/A;  ? groin surgery    ? as a child  ? PORTA CATH INSERTION N/A 06/28/2020  ? Procedure: PORTA CATH INSERTION;  Surgeon: Algernon Huxley, MD;  Location: [facility];  Service: Cardiovascular;  Laterality: N/A;  ? SHOULDER SURGERY    ? UPPER EXTREMITY VENOGRAPHY Right 07/24/2021  ? Procedure: UPPER EXTREMITY VENOGRAPHY;  Surgeon: Katha Cabal, MD;  Location: [facility];  Service: Cardiovascular;  Laterality: Right;  ? ? ?There were no vitals filed for this visit. ? ? Subjective Assessment - 10/24/21 1736   ? ? Subjective  Doing much better- did loose 20 lbs wiht my mouth having those sores after chemo - but  they gave me this mouth wash that works really good- arm doing great- doing the compression night time   ? Currently in Pain? No/denies   ? ?  ?  ? ?  ? ? ? ? ? ? LYMPHEDEMA/ONCOLOGY QUESTIONNAIRE - 10/24/21 0001   ? ?  ? Right Upper Extremity Lymphedema  ? 15 cm Proximal to Olecranon Process 29.5 cm   ? 10 cm Proximal to Olecranon Process 27.5 cm   ? Olecranon Process 26.5 cm   ? 15 cm Proximal to Ulnar Styloid Process 24 cm   ? 10 cm Proximal to Ulnar Styloid Process 21 cm   ? Just Proximal to Ulnar Styloid Process 18 cm   ? Across Hand at [employer] 21.5 cm   ?  ? Left Upper Extremity Lymphedema  ? 15 cm Proximal to Olecranon Process 28 cm   ? 10 cm Proximal to Olecranon Process 27 cm   ? Olecranon Process 27 cm   ? 15 cm Proximal to Ulnar Styloid Process 25 cm   ? 10 cm Proximal to Ulnar Styloid Process 21.5 cm   ? Just Proximal to Ulnar Styloid Process 18 cm   ? Across Hand at [employer] 22 cm   ? ?  ?  ? ?  ? ? ? ? ?OT SCREEN 09/06/21: ? Pt arrive with  compression  bandages on  R UE - son with him today - kept bandages on for 48 hrs ?  ? Circumference taken for R UE Measurements  - decrease greatly  again compare to  2 days ago and last week - upper arm around top of shoulder swollen  but less than 2 days ago - done some fibrotic techniques - son to help with prior to MLD again up there over top of shoulder some during day  ?Review with pt to do some pendulum for shoulder pain free  ?And shoulder  AAROM gentle in supine for flexion - cont AROM elbow , wrist and hand  ?  ?Would not recommend at this time for pt to try and donn 20-71mHG over the counter compression sleeve -do not have the strength and will reinjured UE -and  compression sleeve do not decrease but maintain. ?Son to replace bandage in hour to 2 after getting measured for circ-aid.- Leaving now to get measured ?Eucerin lotion, Isotoner glove  and soft stockinet ?Rosidal foam from hand to upper arm  ?6 cm short stretch 3 x thru and over  hand to forearm figure 8's, 8 cm short stretch wrist , hand , to elbow  figure 8's, 10 cm  mid forearm to  upper arm - stop below radiation area and circular ?[facility] ?  ?OT SCREEN 09/06/21: ? Pt arrive with  compression  bandages on R UE - D-I-L  with pt ?Bandages removed-patient's arm with several red pimples of which 3 of them had puss ?Skin irritated-patient report arm itchy ?Son changed bandages last Thursday and Friday-daughter changed yesterday when noticing redness and irritation ?Applied since last time radiation lotion on arm-did not apply any lotion yesterday ?This OT contacted symptoms management at cancer center-was unable to get appointment today-patient was unable to do telehealth ?Patient has appointment 830 tomorrow morning with symptoms management ?After patient washed arm applied Neosporin on any open areas or pimples-and Cortaid lotion for itching ?Symptoms management nurse educated patient on symptoms and signs that were causing over the night to go to the ED ?Patient denies fever,pain ?  ? Await circ-aid compression garments that should be in this week per Rep from [facility]?  ?Isotoner glove  and soft stockinet donn ?Rosidal foam from hand to upper arm circular 50% overlay ?6 cm short stretch 3 x thru and over hand to forearm figure 8's, 8 cm short stretch wrist , hand , to elbow  figure 8's, 10 cm  mid forearm to  upper arm - stop below radiation area in upper arm ?Pt to keep arm out to side on pillow to air his axilla ?  ?Circ-aid pt will be able to wear during day and night time -and adjust himself   ?Pt to follow up with me later in week  for ed and fitting when Circ-aids come in  ?  ?  ?09/11/20 OT screen:  ?Pt seen today to assess appropriateness for compression wrapping to RUE d/t new skin irritation.  Collaborated with JVonna Kotyk NP who felt rash was likely d/t moisture from lotion application post radiation tx followed by compression  wrapping, and may need to hold off on wrapping this day to limit the skin irritation to RUE.  OT in agreement to allow skin to "breathe," and NP is prescribing new topical cream to tx the irritated skin.  OT encouraged pt that lymphedema appeared well managed at this time and as long as pt  can continue to perform self MLD and AROM/AAROM exercises to promote lymph flow throughout RUE, anticipate that leaving compression wraps off until [place] arrives should not contribute to much or any set back with lymph fluid increase.  Circaid garment was suppose to arrive yesterday but did not, so anticipate in the next day or so.  OT reviewed MLD techniques for upper/lower arm/hand with fair return demo.  OT reviewed self AAROM for R shoulder flex/abd, active elbow flex/ext, forearm pron/sup, wrist flex/ext, digit flex/ext with good return demo.  Instructed pt in table slides for R shoulder flex/abd to tolerance.  Encouraged pt complete MLD and ther ex 1-2x per day to promote lymph flow.  Pt has redness and skin irritation on R upper torso inferior to axilla.  OT encouraged pt position self with R arm slightly abducted with pillow when able to reduce skin friction which can contribute to increased irritation.  Pt verbalized understanding of all education provided and verbalized understanding of how to contact OT for follow up when Circaid garment arrives.   ?  ?OT SCREEN 09/17/20: ?Patient arrived today in therapy without bandages on-daughter accompany him. ?Skin improved greatly with use of medication.  Patient did not fit Friday and the CircAid compression because of being out of compression since the seventh-because of skin issues. ?Did discuss last Friday via phone with daughter and patient about doing some compression bandaging over the past weekend without increasing skin irritation. ?Patient's circumference of right upper extremity decrease per patient and daughter compared to last Friday-contact [facility]-patient able to go tomorrow to try and get fitted for CircAid's again. ?Patient daughter education done of doing light compression overnight until tomorrow to decrease forearm and elbow without skin irritation.

## 2021-10-25 ENCOUNTER — Inpatient Hospital Stay: Payer: Medicare Other

## 2021-10-25 VITALS — BP 128/64

## 2021-10-25 DIAGNOSIS — C159 Malignant neoplasm of esophagus, unspecified: Secondary | ICD-10-CM

## 2021-10-25 DIAGNOSIS — Z5112 Encounter for antineoplastic immunotherapy: Secondary | ICD-10-CM | POA: Diagnosis not present

## 2021-10-25 MED ORDER — PEGFILGRASTIM-CBQV 6 MG/0.6ML ~~LOC~~ SOSY
6.0000 mg | PREFILLED_SYRINGE | Freq: Once | SUBCUTANEOUS | Status: AC
  Administered 2021-10-25: 6 mg via SUBCUTANEOUS
  Filled 2021-10-25: qty 0.6

## 2021-10-25 MED ORDER — HEPARIN SOD (PORK) LOCK FLUSH 100 UNIT/ML IV SOLN
500.0000 [IU] | Freq: Once | INTRAVENOUS | Status: AC | PRN
  Administered 2021-10-25: 500 [IU]
  Filled 2021-10-25: qty 5

## 2021-10-30 ENCOUNTER — Other Ambulatory Visit: Payer: Self-pay | Admitting: *Deleted

## 2021-10-30 MED ORDER — FENTANYL 25 MCG/HR TD PT72: 1.0000 | MEDICATED_PATCH | TRANSDERMAL | 0 refills | Status: DC

## 2021-10-31 ENCOUNTER — Encounter
Admission: RE | Admit: 2021-10-31 | Discharge: 2021-10-31 | Disposition: A | Discharge status: Home / Self Care | Payer: Medicare Other | Source: Ambulatory Visit | Attending: Oncology | Admitting: Oncology

## 2021-10-31 ENCOUNTER — Ambulatory Visit
Admission: RE | Admit: 2021-10-31 | Discharge: 2021-10-31 | Disposition: A | Discharge status: Home / Self Care | Payer: Medicare Other | Source: Ambulatory Visit | Attending: Oncology | Admitting: Oncology

## 2021-10-31 DIAGNOSIS — C159 Malignant neoplasm of esophagus, unspecified: Secondary | ICD-10-CM | POA: Insufficient documentation

## 2021-10-31 MED ORDER — TECHNETIUM TC 99M MEDRONATE IV KIT
20.0000 | PACK | Freq: Once | INTRAVENOUS | Status: AC | PRN
  Administered 2021-10-31: 21.64 via INTRAVENOUS

## 2021-10-31 MED ORDER — IOHEXOL 300 MG/ML  SOLN
100.0000 mL | Freq: Once | INTRAMUSCULAR | Status: AC | PRN
  Administered 2021-10-31: 100 mL via INTRAVENOUS

## 2021-11-05 ENCOUNTER — Ambulatory Visit (INDEPENDENT_AMBULATORY_CARE_PROVIDER_SITE_OTHER): Payer: Medicare Other

## 2021-11-05 DIAGNOSIS — I5189 Other ill-defined heart diseases: Secondary | ICD-10-CM | POA: Diagnosis not present

## 2021-11-05 LAB — ECHOCARDIOGRAM COMPLETE
AR max vel: 2.47 cm2
AV Area VTI: 2.62 cm2
AV Area mean vel: 2.46 cm2
AV Mean grad: 5 mmHg
AV Peak grad: 9.9 mmHg
AV Vena cont: 0.6 cm
Ao pk vel: 1.57 m/s
Area-P 1/2: 2.16 cm2
Calc EF: 50.4 %
S' Lateral: 3.9 cm
Single Plane A2C EF: 46.1 %
Single Plane A4C EF: 55 %

## 2021-11-05 MED ORDER — PERFLUTREN LIPID MICROSPHERE
1.0000 mL | INTRAVENOUS | Status: AC | PRN
  Administered 2021-11-05: 4 mL via INTRAVENOUS

## 2021-11-06 ENCOUNTER — Inpatient Hospital Stay: Payer: Medicare Other

## 2021-11-06 ENCOUNTER — Encounter: Payer: Self-pay | Admitting: Oncology

## 2021-11-06 ENCOUNTER — Inpatient Hospital Stay (HOSPITAL_BASED_OUTPATIENT_CLINIC_OR_DEPARTMENT_OTHER): Payer: Medicare Other | Admitting: Oncology

## 2021-11-06 ENCOUNTER — Inpatient Hospital Stay: Payer: Medicare Other | Attending: Oncology

## 2021-11-06 VITALS — BP 148/73 | HR 81 | Temp 97.2°F | Resp 16 | Ht 71.0 in | Wt 171.2 lb

## 2021-11-06 DIAGNOSIS — Z8719 Personal history of other diseases of the digestive system: Secondary | ICD-10-CM | POA: Diagnosis not present

## 2021-11-06 DIAGNOSIS — Z87891 Personal history of nicotine dependence: Secondary | ICD-10-CM | POA: Diagnosis not present

## 2021-11-06 DIAGNOSIS — I700 Atherosclerosis of aorta: Secondary | ICD-10-CM | POA: Insufficient documentation

## 2021-11-06 DIAGNOSIS — Z808 Family history of malignant neoplasm of other organs or systems: Secondary | ICD-10-CM | POA: Insufficient documentation

## 2021-11-06 DIAGNOSIS — G893 Neoplasm related pain (acute) (chronic): Secondary | ICD-10-CM | POA: Diagnosis not present

## 2021-11-06 DIAGNOSIS — Z87442 Personal history of urinary calculi: Secondary | ICD-10-CM | POA: Insufficient documentation

## 2021-11-06 DIAGNOSIS — Z5111 Encounter for antineoplastic chemotherapy: Secondary | ICD-10-CM

## 2021-11-06 DIAGNOSIS — C155 Malignant neoplasm of lower third of esophagus: Secondary | ICD-10-CM | POA: Diagnosis present

## 2021-11-06 DIAGNOSIS — I6200 Nontraumatic subdural hemorrhage, unspecified: Secondary | ICD-10-CM | POA: Diagnosis not present

## 2021-11-06 DIAGNOSIS — Z833 Family history of diabetes mellitus: Secondary | ICD-10-CM | POA: Insufficient documentation

## 2021-11-06 DIAGNOSIS — C787 Secondary malignant neoplasm of liver and intrahepatic bile duct: Secondary | ICD-10-CM | POA: Insufficient documentation

## 2021-11-06 DIAGNOSIS — C159 Malignant neoplasm of esophagus, unspecified: Secondary | ICD-10-CM

## 2021-11-06 DIAGNOSIS — Z79899 Other long term (current) drug therapy: Secondary | ICD-10-CM | POA: Insufficient documentation

## 2021-11-06 DIAGNOSIS — Z86010 Personal history of colonic polyps: Secondary | ICD-10-CM | POA: Diagnosis not present

## 2021-11-06 DIAGNOSIS — R5383 Other fatigue: Secondary | ICD-10-CM | POA: Diagnosis not present

## 2021-11-06 DIAGNOSIS — K219 Gastro-esophageal reflux disease without esophagitis: Secondary | ICD-10-CM | POA: Diagnosis not present

## 2021-11-06 DIAGNOSIS — Z7189 Other specified counseling: Secondary | ICD-10-CM | POA: Diagnosis not present

## 2021-11-06 DIAGNOSIS — Z5189 Encounter for other specified aftercare: Secondary | ICD-10-CM | POA: Diagnosis not present

## 2021-11-06 DIAGNOSIS — Z800 Family history of malignant neoplasm of digestive organs: Secondary | ICD-10-CM | POA: Diagnosis not present

## 2021-11-06 DIAGNOSIS — Z8249 Family history of ischemic heart disease and other diseases of the circulatory system: Secondary | ICD-10-CM | POA: Insufficient documentation

## 2021-11-06 DIAGNOSIS — Z8042 Family history of malignant neoplasm of prostate: Secondary | ICD-10-CM | POA: Diagnosis not present

## 2021-11-06 LAB — CBC WITH DIFFERENTIAL/PLATELET
Abs Immature Granulocytes: 0.09 10*3/uL — ABNORMAL HIGH (ref 0.00–0.07)
Basophils Absolute: 0 10*3/uL (ref 0.0–0.1)
Basophils Relative: 0 %
Eosinophils Absolute: 0.1 10*3/uL (ref 0.0–0.5)
Eosinophils Relative: 1 %
HCT: 37.3 % — ABNORMAL LOW (ref 39.0–52.0)
Hemoglobin: 11.8 g/dL — ABNORMAL LOW (ref 13.0–17.0)
Immature Granulocytes: 1 %
Lymphocytes Relative: 15 %
Lymphs Abs: 1 10*3/uL (ref 0.7–4.0)
MCH: 30.2 pg (ref 26.0–34.0)
MCHC: 31.6 g/dL (ref 30.0–36.0)
MCV: 95.4 fL (ref 80.0–100.0)
Monocytes Absolute: 0.7 10*3/uL (ref 0.1–1.0)
Monocytes Relative: 9 %
Neutro Abs: 5 10*3/uL (ref 1.7–7.7)
Neutrophils Relative %: 74 %
Platelets: 196 10*3/uL (ref 150–400)
RBC: 3.91 MIL/uL — ABNORMAL LOW (ref 4.22–5.81)
RDW: 15.9 % — ABNORMAL HIGH (ref 11.5–15.5)
WBC: 6.9 10*3/uL (ref 4.0–10.5)
nRBC: 0 % (ref 0.0–0.2)

## 2021-11-06 LAB — COMPREHENSIVE METABOLIC PANEL
ALT: 21 U/L (ref 0–44)
AST: 29 U/L (ref 15–41)
Albumin: 4 g/dL (ref 3.5–5.0)
Alkaline Phosphatase: 223 U/L — ABNORMAL HIGH (ref 38–126)
Anion gap: 6 (ref 5–15)
BUN: 10 mg/dL (ref 8–23)
CO2: 23 mmol/L (ref 22–32)
Calcium: 9 mg/dL (ref 8.9–10.3)
Chloride: 107 mmol/L (ref 98–111)
Creatinine, Ser: 0.75 mg/dL (ref 0.61–1.24)
GFR, Estimated: >60 mL/min (ref >60)
Glucose, Bld: 120 mg/dL — ABNORMAL HIGH (ref 70–99)
Potassium: 3.6 mmol/L (ref 3.5–5.1)
Sodium: 136 mmol/L (ref 135–145)
Total Bilirubin: 1.2 mg/dL (ref 0.3–1.2)
Total Protein: 6.7 g/dL (ref 6.5–8.1)

## 2021-11-06 MED ORDER — PALONOSETRON HCL INJECTION 0.25 MG/5ML
0.2500 mg | Freq: Once | INTRAVENOUS | Status: AC
  Administered 2021-11-06: 0.25 mg via INTRAVENOUS
  Filled 2021-11-06: qty 5

## 2021-11-06 MED ORDER — SODIUM CHLORIDE 0.9 % IV SOLN
180.0000 mg/m2 | Freq: Once | INTRAVENOUS | Status: AC
  Administered 2021-11-06: 380 mg via INTRAVENOUS
  Filled 2021-11-06: qty 15

## 2021-11-06 MED ORDER — SODIUM CHLORIDE 0.9 % IV SOLN
10.0000 mg | Freq: Once | INTRAVENOUS | Status: AC
  Administered 2021-11-06: 10 mg via INTRAVENOUS
  Filled 2021-11-06: qty 10

## 2021-11-06 MED ORDER — SODIUM CHLORIDE 0.9 % IV SOLN
Freq: Once | INTRAVENOUS | Status: AC
  Filled 2021-11-06: qty 250

## 2021-11-06 MED ORDER — ATROPINE SULFATE 1 MG/ML IV SOLN
0.5000 mg | Freq: Once | INTRAVENOUS | Status: AC | PRN
  Administered 2021-11-06: 0.5 mg via INTRAVENOUS
  Filled 2021-11-06: qty 1

## 2021-11-06 MED ORDER — FLUOROURACIL CHEMO INJECTION 2.5 GM/50ML
400.0000 mg/m2 | Freq: Once | INTRAVENOUS | Status: AC
  Administered 2021-11-06: 850 mg via INTRAVENOUS
  Filled 2021-11-06: qty 17

## 2021-11-06 MED ORDER — SODIUM CHLORIDE 0.9 % IV SOLN
2400.0000 mg/m2 | INTRAVENOUS | Status: DC
  Administered 2021-11-06: 5000 mg via INTRAVENOUS
  Filled 2021-11-06: qty 100

## 2021-11-06 MED ORDER — SODIUM CHLORIDE 0.9 % IV SOLN
400.0000 mg/m2 | Freq: Once | INTRAVENOUS | Status: AC
  Administered 2021-11-06: 832 mg via INTRAVENOUS
  Filled 2021-11-06: qty 41.6

## 2021-11-06 NOTE — Progress Notes (Signed)
? ? ? ?Hematology/Oncology Consult note ?[facility]  ?[phone] [phone] ? ?Patient Care Team: ?Venia Carbon, MD as PCP - General ?[name], RN as Oncology Nurse Navigator ?[name], MD as Consulting Physician (Hematology and Oncology)  ? ?Name of the patient: Cory [name]  ?[id number]  ?March 24, 1956  ? ?Date of visit: 11/06/21 ? 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  ? ?Chief complaint/ Reason for visit-on treatment assessment prior to cycle 6 of FOLFIRI chemotherapy ? ?Heme/Onc history: Patient is a 66 year old male with history of metastatic esophageal adenocarcinoma s/p progression on first-line FOLFOX trastuzumab Keytruda chemotherapy.NGS testing showed high tumor mutational burden CPS score less than 1. CD9-NRG1 fusion. BRAF 581L, CCN E1 gain.  EMLA for fusion of BX [id number], FGF 23 gain, FGF 6 gain, MET R988C, T p53 S1 35F. ERBB2 gain but no other actionable mutations repeat NGS testing at progression showed loss of HER2 expression.  He was therefore switched to FOLFIRI chemotherapy. ?  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  Case discussed with neurosurgery and this spot was thought to be difficult to biopsy.    MRI brain in December 2022 showed significant enlargement of this mass to 4 cm with associated edema in the bilateral occipital lobes and marginal hemorrhage along the left aspect of the mass.  Increased subdural hematoma along the left cerebral convexity.  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  Patient received [place] radiation since single fraction to this mass.  Patient also has an intra choroidal met for which he will be seeing retina specialist at [facility] ?  ?Also noted to have fracture of the right upper extremity  possibly pathologic and underwent palliative radiation for the same. ?  ?Disease progression in February 2023 and patient was switched to FOLFIRI chemotherapy ? ?Interval history-patient is tolerating chemotherapy well without any significant side effects.  Denies any nausea or vomiting.  Occasional mouth sores which resolved with Magic mouthwash. He continues to be active. ?ECOG PS- 1 ?Pain scale- 0 ? ? ?Review of systems- Review of Systems  ?Constitutional:  Negative for chills, fever, malaise/fatigue and weight loss.  ?HENT:  Negative for congestion, ear discharge and nosebleeds.   ?Eyes:  Negative for blurred vision.  ?Respiratory:  Negative for cough, hemoptysis, sputum production, shortness of breath and wheezing.   ?Cardiovascular:  Negative for chest pain, palpitations, orthopnea and claudication.  ?Gastrointestinal:  Negative for abdominal pain, blood in stool, constipation, diarrhea, heartburn, melena, nausea and vomiting.  ?Genitourinary:  Negative for dysuria, flank pain, frequency, hematuria and urgency.  ?Musculoskeletal:  Negative for back pain, joint pain and myalgias.  ?Skin:  Negative for rash.  ?Neurological:  Negative for dizziness, tingling, focal weakness, seizures, weakness and headaches.  ?Endo/Heme/Allergies:  Does not bruise/bleed easily.  ?Psychiatric/Behavioral:  Negative for depression and suicidal ideas. The patient does not have insomnia.    ? ? ?No Known Allergies ? ? ?Past Medical History:  ?Diagnosis Date  ? 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 Family history of brain cancer   ? Family history of colon cancer   ? Family history of melanoma   ? Family history of stomach cancer   ? GERD (gastroesophageal reflux disease)   ? History of kidney stones   ? Nephrolithiasis   ? [facility]  ?  Pancreatitis, acute   ? Personal history of colonic polyps   ? Dr Nicolasa Ducking  ? Psoriasis   ? ? ? ?Past Surgical History:  ?Procedure Laterality Date  ? COLONOSCOPY    ? COLONOSCOPY WITH PROPOFOL N/A  03/31/2020  ? Procedure: COLONOSCOPY WITH PROPOFOL;  Surgeon: Jonathon Bellows, MD;  Location: [facility] ENDOSCOPY;  Service: Gastroenterology;  Laterality: N/A;  ? ERCP    ? ESOPHAGOGASTRODUODENOSCOPY (EGD) WITH PROPOFOL N/A 06/19/2020  ? Procedure: ESOPHAGOGASTRODUODENOSCOPY (EGD) WITH PROPOFOL;  Surgeon: Jonathon Bellows, MD;  Location: [facility] ENDOSCOPY;  Service: Gastroenterology;  Laterality: N/A;  ? groin surgery    ? as a child  ? PORTA CATH INSERTION N/A 06/28/2020  ? Procedure: PORTA CATH INSERTION;  Surgeon: Algernon Huxley, MD;  Location: [place] [facility];  Service: Cardiovascular;  Laterality: N/A;  ? SHOULDER SURGERY    ? UPPER EXTREMITY VENOGRAPHY Right 07/24/2021  ? Procedure: UPPER EXTREMITY VENOGRAPHY;  Surgeon: Katha Cabal, MD;  Location: [facility] [facility];  Service: Cardiovascular;  Laterality: Right;  ? ? ?Social History  ? ?Socioeconomic History  ? Marital status: Married  ?  Spouse name: Not on file  ? Number of children: 2  ? Years of education: Not on file  ? Highest education level: Not on file  ?Occupational History  ? 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 Smoking status: Former  ?  Types: Cigarettes  ?  Quit date: 07/08/1988  ?  Years since quitting: 33.3  ? Smokeless tobacco: Never  ?Vaping Use  ? Vaping Use: Never used  ?Substance and Sexual Activity  ? Alcohol use: No  ?  Comment: recovering alcoholic for 13 years  ? Drug use: No  ? Sexual activity: Not Currently  ?Other Topics Concern  ? Not on file  ?Social History Narrative  ? Has living will--needs to get notarized  ? Daughter should be health care POA (wife with dementia)  ? Would accept resuscitation  ? Would accept tube feeds depending on the circumstance  ? ?Social Determinants of Health  ? ?Financial Resource Strain: Not on file  ?Food Insecurity: Not on file  ?Transportation Needs: Not on file  ?Physical Activity: Not on file  ?Stress: Not on file  ?Social Connections: Not on file  ?Intimate  Partner Violence: Not on file  ? ? ?Family History  ?Problem Relation Age of Onset  ? Stomach cancer Mother   ? Diabetes Brother   ? Hypertension Brother   ? Hypertension Father   ? Prostate cancer Father   ? Colon cancer Sister   ? Cancer Other   ?     either cervical or ovarian, d. 12s  ? Brain cancer Sister   ? ? ? ?Current Outpatient Medications:  ?  fentaNYL (DURAGESIC) 25 MCG/HR, Place 1 patch onto the skin every 3 (three) days., Disp: 10 patch, Rfl: 0 ?  lidocaine-prilocaine (EMLA) cream, Apply 1 application topically as needed. Apply small amount to port site at least 1 hour prior to it being accessed, cover with plastic wrap, Disp: 30 g, Rfl: 3 ?  Oxycodone HCl 10 MG TABS, Take 1 tablet (10 mg total) by mouth every 4 (four) hours as needed., Disp: 120 tablet, Rfl: 0 ?  pantoprazole (PROTONIX) 40 MG tablet, TAKE 1 TABLET BY MOUTH TWICE A DAY BEFORE MEALS, Disp: 60 tablet, Rfl: 11 ?  loperamide (IMODIUM) 2 MG capsule, Take 1 capsule (2 mg total) by mouth  as needed. Take 2 at diarrhea onset , then 1 every 2hr until 12hrs with no BM. May take 2 every 4hrs at night. If diarrhea recurs repeat. (Patient not taking: Reported on 09/12/2021), Disp: 30 capsule, Rfl: 3 ?  prochlorperazine (COMPAZINE) 10 MG tablet, Take 1 tablet (10 mg total) by mouth every 6 (six) hours as needed for nausea or vomiting. (Patient not taking: Reported on 10/10/2021), Disp: 30 tablet, Rfl: 0 ?  silver sulfADIAZINE (SILVADENE) 1 % cream, Apply 1 application. topically 2 (two) times daily. (Patient not taking: Reported on 10/23/2021), Disp: 50 g, Rfl: 3 ?No current facility-administered medications for this visit. ? ?Facility-Administered Medications Ordered in Other Visits:  ?  fluorouracil (ADRUCIL) 5,000 mg in sodium chloride 0.9 % 150 mL chemo infusion, 2,400 mg/m2 (Treatment Plan Recorded), Intravenous, 1 day or 1 dose, [name], MD ?  fluorouracil (ADRUCIL) chemo injection 850 mg, 400 mg/m2 (Treatment Plan Recorded), Intravenous,  Once, [name], MD ?  irinotecan (CAMPTOSAR) 380 mg in sodium chloride 0.9 % 500 mL chemo infusion, 180 mg/m2 (Treatment Plan Recorded), Intravenous, Once, [name], MD, Last Rate: 346 mL/hr at 05/02

## 2021-11-06 NOTE — Progress Notes (Signed)
Nutrition Follow-up: ? ?Patient identified on Malnutrition screening report for poor appetite and weight loss. ? ?Last seen by RD on 06/2021 ? 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  Patient with disease progression in Feb 2023 and treatment changed to foliri ? ?Met with patient during infusion.  Patient reports trouble with mouth sores, burning that has effected intake.  Says that magic mouthwash really helped and he has some on hand currently.  He will start this right away if starting to have symptoms.  Says that he is drinking boost plus shakes 2-3 times per day.  Trying to eat as much as he can orally.  ? ?Medications: reviewed ? ?Labs: glucose 120 ? ?Anthropometrics:  ? ?Weight 171 lb 3.2 oz ? ?198 lb 12.8 oz on 06/19/21 ? ?14% weight loss in the last 4 months ? ? ?NUTRITION DIAGNOSIS: Inadequate oral intake decreased due to side effects of treatment ? ? ?INTERVENTION:  ?Recommend boost VHC shake (530 calories and 22 g protein) in place of boost plus for added calories.  Information on ordering given to patient.  Son will order online.  ?Discussed strategies for sore mouth.  Handout given.   ?Coupons given for boost shakes ?  ? ?MONITORING, EVALUATION, GOAL: weight trends, intake ? ? ?NEXT VISIT: Tuesday, May 16 during infusion ? ?Dameian Crisman B. Zenia Resides, RD, LDN ?Registered Dietitian ?[phone] ? ? ?

## 2021-11-06 NOTE — Patient Instructions (Signed)
[facility]  Discharge Instructions: ?Thank you for choosing [facility] to provide your oncology and hematology care.  ?If you have a lab appointment with the Cancer Center, please go directly to the Cancer Center and check in at the registration area. ? ?Wear comfortable clothing and clothing appropriate for easy access to any Portacath or PICC line.  ? ?We strive to give you quality time with your provider. You may need to reschedule your appointment if you arrive late (15 or more minutes).  Arriving late affects you and other patients whose appointments are after yours.  Also, if you miss three or more appointments without notifying the office, you may be dismissed from the clinic at the provider?s discretion.    ?  ?For prescription refill requests, have your pharmacy contact our office and allow 72 hours for refills to be completed.   ? ?  ?To help prevent nausea and vomiting after your treatment, we encourage you to take your nausea medication as directed. ? ?BELOW ARE SYMPTOMS THAT SHOULD BE REPORTED IMMEDIATELY: ?*FEVER GREATER THAN 100.4 F (38 ?C) OR HIGHER ?*CHILLS OR SWEATING ?*NAUSEA AND VOMITING THAT IS NOT CONTROLLED WITH YOUR NAUSEA MEDICATION ?*UNUSUAL SHORTNESS OF BREATH ?*UNUSUAL BRUISING OR BLEEDING ?*URINARY PROBLEMS (pain or burning when urinating, or frequent urination) ?*BOWEL PROBLEMS (unusual diarrhea, constipation, pain near the anus) ?TENDERNESS IN MOUTH AND THROAT WITH OR WITHOUT PRESENCE OF ULCERS (sore throat, sores in mouth, or a toothache) ?UNUSUAL RASH, SWELLING OR PAIN  ?UNUSUAL VAGINAL DISCHARGE OR ITCHING  ? ?Items with * indicate a potential emergency and should be followed up as soon as possible or go to the Emergency Department if any problems should occur. ? ?Please show the CHEMOTHERAPY ALERT CARD or IMMUNOTHERAPY ALERT CARD at check-in to the Emergency Department and triage nurse. ? ?Should you have questions after your visit  or need to cancel or reschedule your appointment, please contact [facility]  [phone] and follow the prompts.  Office hours are 8:00 a.m. to 4:30 p.m. Monday - Friday. Please note that voicemails left after 4:00 p.m. may not be returned until the following business day.  We are closed weekends and major holidays. You have access to a nurse at all times for urgent questions. Please call the main number to the clinic [phone] and follow the prompts. ? ?For any non-urgent questions, you may also contact your provider using MyChart. We now offer e-Visits for anyone 18 and older to request care online for non-urgent symptoms. For details visit [email or web address]. ?  ?Also download the MyChart app! Go to the app store, search "MyChart", open the app, select [facility], and log in with your MyChart username and password. ? ?Due to Covid, a mask is required upon entering the hospital/clinic. If you do not have a mask, one will be given to you upon arrival. For doctor visits, patients may have 1 support person aged 18 or older with them. For treatment visits, patients cannot have anyone with them due to current Covid guidelines and our immunocompromised population.  ?

## 2021-11-08 ENCOUNTER — Telehealth: Payer: Self-pay

## 2021-11-08 ENCOUNTER — Inpatient Hospital Stay: Payer: Medicare Other

## 2021-11-08 VITALS — BP 133/70 | HR 69 | Resp 17

## 2021-11-08 DIAGNOSIS — C159 Malignant neoplasm of esophagus, unspecified: Secondary | ICD-10-CM

## 2021-11-08 DIAGNOSIS — Z5111 Encounter for antineoplastic chemotherapy: Secondary | ICD-10-CM | POA: Diagnosis not present

## 2021-11-08 MED ORDER — HEPARIN SOD (PORK) LOCK FLUSH 100 UNIT/ML IV SOLN
500.0000 [IU] | Freq: Once | INTRAVENOUS | Status: AC | PRN
  Administered 2021-11-08: 500 [IU]
  Filled 2021-11-08: qty 5

## 2021-11-08 MED ORDER — HEPARIN SOD (PORK) LOCK FLUSH 100 UNIT/ML IV SOLN
INTRAVENOUS | Status: AC
  Filled 2021-11-08: qty 5

## 2021-11-08 MED ORDER — SODIUM CHLORIDE 0.9% FLUSH
10.0000 mL | INTRAVENOUS | Status: DC | PRN
  Administered 2021-11-08: 10 mL
  Filled 2021-11-08: qty 10

## 2021-11-08 MED ORDER — PEGFILGRASTIM-CBQV 6 MG/0.6ML ~~LOC~~ SOSY
6.0000 mg | PREFILLED_SYRINGE | Freq: Once | SUBCUTANEOUS | Status: AC
  Administered 2021-11-08: 6 mg via SUBCUTANEOUS
  Filled 2021-11-08: qty 0.6

## 2021-11-08 NOTE — Telephone Encounter (Signed)
-----   Message from Wellington Hampshire, MD sent at 11/08/2021 12:12 PM EDT ----- ?Stable LV mass.  Follow-up with me in 2 to 3 months. ?

## 2021-11-08 NOTE — Telephone Encounter (Signed)
Patient made aware of echo results  with verbalized understanding. ?F/u appt scheduled on 02/07/22 @ 9:20 am with Dr. Fletcher Anon. ?Pt aware of the appt date and time. ?

## 2021-11-11 NOTE — Progress Notes (Signed)
? ? ? ? ?MRN : [id number] ? ?Cory [name] is a 66 y.o. (Jul 26, 1955) male who presents with chief complaint of legs swell. ? ?History of Present Illness:  ? ?Patient is a 66 year old gentleman with a history of stage IV esophageal cancer who is been undergoing treatment for a while.  He now has a compression sleeve which has helped a lot and he notes the swelling has largely resolved.  He is done with XRT. It is not associated with significant pain or redness.  He has essentially full hand function. ? ?Procedure 07/24/2021: ?Although there may be some damage to the deep venous system in the upper arm the venography suggests fairly good outflow without central venous obstruction and therefore the degree of edema he has would not be explained by this study. ? ?No outpatient medications have been marked as taking for the 11/12/21 encounter (Appointment) with Delana Meyer, Dolores Lory, MD.  ? ? ?Past Medical History:  ?Diagnosis Date  ? Esophageal adenocarcinoma ([place]) 06/23/2020  ? Family history of brain cancer   ? Family history of colon cancer   ? Family history of melanoma   ? Family history of stomach cancer   ? GERD (gastroesophageal reflux disease)   ? History of kidney stones   ? Nephrolithiasis   ? [facility]  ? Pancreatitis, acute   ? Personal history of colonic polyps   ? Dr Nicolasa Ducking  ? Psoriasis   ? ? ?Past Surgical History:  ?Procedure Laterality Date  ? COLONOSCOPY    ? COLONOSCOPY WITH PROPOFOL N/A 03/31/2020  ? Procedure: COLONOSCOPY WITH PROPOFOL;  Surgeon: Jonathon Bellows, MD;  Location: [facility] ENDOSCOPY;  Service: Gastroenterology;  Laterality: N/A;  ? ERCP    ? ESOPHAGOGASTRODUODENOSCOPY (EGD) WITH PROPOFOL N/A 06/19/2020  ? Procedure: ESOPHAGOGASTRODUODENOSCOPY (EGD) WITH PROPOFOL;  Surgeon: Jonathon Bellows, MD;  Location: [facility] ENDOSCOPY;  Service: Gastroenterology;  Laterality: N/A;  ? groin surgery    ? as a child  ? PORTA CATH INSERTION N/A 06/28/2020  ? Procedure: PORTA CATH INSERTION;  Surgeon: Algernon Huxley, MD;   Location: [place] [facility];  Service: Cardiovascular;  Laterality: N/A;  ? SHOULDER SURGERY    ? UPPER EXTREMITY VENOGRAPHY Right 07/24/2021  ? Procedure: UPPER EXTREMITY VENOGRAPHY;  Surgeon: Katha Cabal, MD;  Location: [place] [facility];  Service: Cardiovascular;  Laterality: Right;  ? ? ?Social History ?Social History  ? ?Tobacco Use  ? Smoking status: Former  ?  Types: Cigarettes  ?  Quit date: 07/08/1988  ?  Years since quitting: 33.3  ? Smokeless tobacco: Never  ?Vaping Use  ? Vaping Use: Never used  ?Substance Use Topics  ? Alcohol use: No  ?  Comment: recovering alcoholic for 13 years  ? Drug use: No  ? ? ?Family History ?Family History  ?Problem Relation Age of Onset  ? Stomach cancer Mother   ? Diabetes Brother   ? Hypertension Brother   ? Hypertension Father   ? Prostate cancer Father   ? Colon cancer Sister   ? Cancer Other   ?     either cervical or ovarian, d. 7s  ? Brain cancer Sister   ? ? ?No Known Allergies ? ? ?REVIEW OF SYSTEMS (Negative unless checked) ? ?Constitutional: '[]'$ Weight loss  '[]'$ Fever  '[]'$ Chills ?Cardiac: '[]'$ Chest pain   '[]'$ Chest pressure   '[]'$ Palpitations   '[]'$ Shortness of breath when laying flat   '[]'$ Shortness of breath with exertion. ?Vascular:  '[]'$ Pain in legs with walking   '[x]'$ Pain  in legs with standing  '[]'$ History of DVT   '[]'$ Phlebitis   '[x]'$ Swelling in legs   '[]'$ Varicose veins   '[]'$ Non-healing ulcers ?Pulmonary:   '[]'$ Uses home oxygen   '[]'$ Productive cough   '[]'$ Hemoptysis   '[]'$ Wheeze  '[]'$ COPD   '[]'$ Asthma ?Neurologic:  '[]'$ Dizziness   '[]'$ Seizures   '[]'$ History of stroke   '[]'$ History of TIA  '[]'$ Aphasia   '[]'$ Vissual changes   '[]'$ Weakness or numbness in arm   '[]'$ Weakness or numbness in leg ?Musculoskeletal:   '[]'$ Joint swelling   '[]'$ Joint pain   '[]'$ Low back pain ?Hematologic:  '[]'$ Easy bruising  '[]'$ Easy bleeding   '[]'$ Hypercoagulable state   '[]'$ Anemic ?Gastrointestinal:  '[]'$ Diarrhea   '[]'$ Vomiting  '[x]'$ Gastroesophageal reflux/heartburn   '[]'$ Difficulty swallowing. ?Genitourinary:  '[]'$ Chronic kidney disease    '[]'$ Difficult urination  '[]'$ Frequent urination   '[]'$ Blood in urine ?Skin:  '[]'$ Rashes   '[]'$ Ulcers  ?Psychological:  '[]'$ History of anxiety   '[]'$  History of major depression. ? ?Physical Examination ? ?There were no vitals filed for this visit. ?There is no height or weight on file to calculate BMI. ?Gen: WD/WN, NAD ?Head: [place]/AT, No temporalis wasting.  ?Ear/Nose/Throat: Hearing grossly intact, nares w/o erythema or drainage, pinna without lesions ?Eyes: PER, EOMI, sclera nonicteric.  ?Neck: Supple, no gross masses.  No JVD.  ?Pulmonary:  Good air movement, no audible wheezing, no use of accessory muscles.  ?Cardiac: RRR, precordium not hyperdynamic. ?Vascular:  trace soft pitting edema right arm ?Vessel Right Left  ?Radial Palpable Palpable  ?Gastrointestinal: soft, non-distended. No guarding/no peritoneal signs.  ?Musculoskeletal: M/S 5/5 throughout.  No deformity.  ?Neurologic: CN 2-12 intact. Pain and light touch intact in extremities.  Symmetrical.  Speech is fluent. Motor exam as listed above. ?Psychiatric: Judgment intact, Mood & affect appropriate for pt's clinical situation. ?Dermatologic: Venous rashes no ulcers noted.  No changes consistent with cellulitis. ?Lymph : No lichenification or skin changes of chronic lymphedema. ? ?CBC ?Lab Results  ?Component Value Date  ? WBC 6.9 11/06/2021  ? HGB 11.8 (L) 11/06/2021  ? HCT 37.3 (L) 11/06/2021  ? MCV 95.4 11/06/2021  ? PLT 196 11/06/2021  ? ? ?BMET ?   ?Component Value Date/Time  ? NA 136 11/06/2021 0752  ? K 3.6 11/06/2021 0752  ? CL 107 11/06/2021 0752  ? CO2 23 11/06/2021 0752  ? GLUCOSE 120 (H) 11/06/2021 0752  ? BUN 10 11/06/2021 0752  ? CREATININE 0.75 11/06/2021 0752  ? CALCIUM 9.0 11/06/2021 0752  ? GFRNONAA >60 11/06/2021 0752  ? ?Estimated Creatinine Clearance: 96.7 mL/min (by C-G formula based on SCr of 0.75 mg/dL). ? ?COAG ?No results found for: INR, PROTIME ? ?Radiology ?NM Bone Scan Whole Body ? ?Result Date: 11/01/2021 ?CLINICAL DATA:  Metastatic  esophageal carcinoma EXAM: NUCLEAR MEDICINE WHOLE BODY BONE SCAN TECHNIQUE: Whole body anterior and posterior images were obtained approximately 3 hours after intravenous injection of radiopharmaceutical. RADIOPHARMACEUTICALS:  21.64 mCi Technetium-100mMDP IV COMPARISON:  None available. FINDINGS: There are abnormal foci of tracer uptake in the calvarium, right humerus, sternum, bilateral ribs, thoracic spine, lumbar spine pelvis and left femur. There is possible small focus of uptake in the proximal left tibia. There is possible expansile lesion in the proximal shaft of right humerus. There is activity in the kidneys and urinary bladder. IMPRESSION: There are multiple foci of abnormal tracer uptake in the axial and proximal appendicular skeleton as described in the body of the report. Findings suggest skeletal metastatic disease. Electronically Signed   By: PElmer PickerM.D.   On: 11/01/2021  14:13  ? ?CT CHEST ABDOMEN PELVIS W CONTRAST ? ?Result Date: 10/31/2021 ?CLINICAL DATA:  Metastatic esophageal cancer restaging * Tracking Code: BO * EXAM: CT CHEST, ABDOMEN, AND PELVIS WITH CONTRAST TECHNIQUE: Multidetector CT imaging of the chest, abdomen and pelvis was performed following the standard protocol during bolus administration of intravenous contrast. RADIATION DOSE REDUCTION: This exam was performed according to the departmental dose-optimization program which includes automated exposure control, adjustment of the mA and/or kV according to patient size and/or use of iterative reconstruction technique. CONTRAST:  16m OMNIPAQUE IOHEXOL 300 MG/ML SOLN, additional oral enteric contrast COMPARISON:  CT abdomen pelvis, 07/19/2021, PET-CT, 05/22/2021, CT chest abdomen pelvis, 04/16/2021 FINDINGS: CT CHEST FINDINGS Cardiovascular: Right chest port catheter. Aortic atherosclerosis. Normal heart size. No pericardial effusion. Mediastinum/Nodes: No enlarged mediastinal, hilar, or axillary lymph nodes. Thyroid gland,  trachea, and esophagus demonstrate no significant findings. Lungs/Pleura: Mild centrilobular and paraseptal emphysema. Multiple bilateral pulmonary nodules are slightly diminished in size, for example of the medial segmen

## 2021-11-12 ENCOUNTER — Encounter (INDEPENDENT_AMBULATORY_CARE_PROVIDER_SITE_OTHER): Payer: Self-pay | Admitting: Vascular Surgery

## 2021-11-12 ENCOUNTER — Ambulatory Visit (INDEPENDENT_AMBULATORY_CARE_PROVIDER_SITE_OTHER): Payer: Medicare Other | Admitting: Vascular Surgery

## 2021-11-12 VITALS — BP 117/54 | HR 67 | Resp 17 | Ht 71.0 in | Wt 168.0 lb

## 2021-11-12 DIAGNOSIS — C159 Malignant neoplasm of esophagus, unspecified: Secondary | ICD-10-CM | POA: Diagnosis not present

## 2021-11-12 DIAGNOSIS — I890 Lymphedema, not elsewhere classified: Secondary | ICD-10-CM | POA: Diagnosis not present

## 2021-11-12 DIAGNOSIS — M7989 Other specified soft tissue disorders: Secondary | ICD-10-CM

## 2021-11-18 ENCOUNTER — Encounter (INDEPENDENT_AMBULATORY_CARE_PROVIDER_SITE_OTHER): Payer: Self-pay | Admitting: Vascular Surgery

## 2021-11-19 MED FILL — Dexamethasone Sodium Phosphate Inj 100 MG/10ML: INTRAMUSCULAR | Qty: 1 | Status: AC

## 2021-11-20 ENCOUNTER — Inpatient Hospital Stay: Payer: Medicare Other

## 2021-11-20 ENCOUNTER — Inpatient Hospital Stay (HOSPITAL_BASED_OUTPATIENT_CLINIC_OR_DEPARTMENT_OTHER): Payer: Medicare Other | Admitting: Oncology

## 2021-11-20 ENCOUNTER — Other Ambulatory Visit: Payer: Self-pay | Admitting: *Deleted

## 2021-11-20 ENCOUNTER — Encounter: Payer: Self-pay | Admitting: Oncology

## 2021-11-20 VITALS — BP 143/87 | HR 65 | Temp 97.7°F | Resp 16 | Ht 71.0 in | Wt 169.2 lb

## 2021-11-20 DIAGNOSIS — C159 Malignant neoplasm of esophagus, unspecified: Secondary | ICD-10-CM

## 2021-11-20 DIAGNOSIS — Z5111 Encounter for antineoplastic chemotherapy: Secondary | ICD-10-CM

## 2021-11-20 DIAGNOSIS — G893 Neoplasm related pain (acute) (chronic): Secondary | ICD-10-CM | POA: Diagnosis not present

## 2021-11-20 LAB — CBC WITH DIFFERENTIAL/PLATELET
Abs Immature Granulocytes: 0.1 10*3/uL — ABNORMAL HIGH (ref 0.00–0.07)
Basophils Absolute: 0 10*3/uL (ref 0.0–0.1)
Basophils Relative: 1 %
Eosinophils Absolute: 0 10*3/uL (ref 0.0–0.5)
Eosinophils Relative: 1 %
HCT: 36.9 % — ABNORMAL LOW (ref 39.0–52.0)
Hemoglobin: 11.6 g/dL — ABNORMAL LOW (ref 13.0–17.0)
Immature Granulocytes: 2 %
Lymphocytes Relative: 13 %
Lymphs Abs: 0.8 10*3/uL (ref 0.7–4.0)
MCH: 29.7 pg (ref 26.0–34.0)
MCHC: 31.4 g/dL (ref 30.0–36.0)
MCV: 94.4 fL (ref 80.0–100.0)
Monocytes Absolute: 0.6 10*3/uL (ref 0.1–1.0)
Monocytes Relative: 9 %
Neutro Abs: 5 10*3/uL (ref 1.7–7.7)
Neutrophils Relative %: 74 %
Platelets: 205 10*3/uL (ref 150–400)
RBC: 3.91 MIL/uL — ABNORMAL LOW (ref 4.22–5.81)
RDW: 15.8 % — ABNORMAL HIGH (ref 11.5–15.5)
WBC: 6.6 10*3/uL (ref 4.0–10.5)
nRBC: 0 % (ref 0.0–0.2)

## 2021-11-20 LAB — COMPREHENSIVE METABOLIC PANEL
ALT: 34 U/L (ref 0–44)
AST: 38 U/L (ref 15–41)
Albumin: 3.7 g/dL (ref 3.5–5.0)
Alkaline Phosphatase: 210 U/L — ABNORMAL HIGH (ref 38–126)
Anion gap: 12 (ref 5–15)
BUN: 9 mg/dL (ref 8–23)
CO2: 22 mmol/L (ref 22–32)
Calcium: 9 mg/dL (ref 8.9–10.3)
Chloride: 105 mmol/L (ref 98–111)
Creatinine, Ser: 0.71 mg/dL (ref 0.61–1.24)
GFR, Estimated: >60 mL/min (ref >60)
Glucose, Bld: 132 mg/dL — ABNORMAL HIGH (ref 70–99)
Potassium: 3.4 mmol/L — ABNORMAL LOW (ref 3.5–5.1)
Sodium: 139 mmol/L (ref 135–145)
Total Bilirubin: 1.1 mg/dL (ref 0.3–1.2)
Total Protein: 6.3 g/dL — ABNORMAL LOW (ref 6.5–8.1)

## 2021-11-20 MED ORDER — LIDOCAINE-PRILOCAINE 2.5-2.5 % EX CREA
1.0000 | TOPICAL_CREAM | CUTANEOUS | 3 refills | Status: AC | PRN
Start: 2021-11-20 — End: ?

## 2021-11-20 MED ORDER — DEXAMETHASONE 4 MG PO TABS: 8.0000 mg | ORAL_TABLET | Freq: Every day | ORAL | 1 refills | Status: DC

## 2021-11-20 MED ORDER — SODIUM CHLORIDE 0.9 % IV SOLN
400.0000 mg/m2 | Freq: Once | INTRAVENOUS | Status: AC
  Administered 2021-11-20: 832 mg via INTRAVENOUS
  Filled 2021-11-20: qty 41.6

## 2021-11-20 MED ORDER — PALONOSETRON HCL INJECTION 0.25 MG/5ML
0.2500 mg | Freq: Once | INTRAVENOUS | Status: AC
  Administered 2021-11-20: 0.25 mg via INTRAVENOUS
  Filled 2021-11-20: qty 5

## 2021-11-20 MED ORDER — FLUOROURACIL CHEMO INJECTION 2.5 GM/50ML
400.0000 mg/m2 | Freq: Once | INTRAVENOUS | Status: AC
  Administered 2021-11-20: 850 mg via INTRAVENOUS
  Filled 2021-11-20: qty 17

## 2021-11-20 MED ORDER — ATROPINE SULFATE 1 MG/ML IV SOLN
0.5000 mg | Freq: Once | INTRAVENOUS | Status: AC | PRN
  Administered 2021-11-20: 0.5 mg via INTRAVENOUS
  Filled 2021-11-20: qty 1

## 2021-11-20 MED ORDER — SODIUM CHLORIDE 0.9 % IV SOLN
2400.0000 mg/m2 | INTRAVENOUS | Status: DC
  Administered 2021-11-20: 5000 mg via INTRAVENOUS
  Filled 2021-11-20: qty 100

## 2021-11-20 MED ORDER — SODIUM CHLORIDE 0.9 % IV SOLN
Freq: Once | INTRAVENOUS | Status: AC
  Filled 2021-11-20: qty 250

## 2021-11-20 MED ORDER — SODIUM CHLORIDE 0.9 % IV SOLN
10.0000 mg | Freq: Once | INTRAVENOUS | Status: AC
  Administered 2021-11-20: 10 mg via INTRAVENOUS
  Filled 2021-11-20: qty 10

## 2021-11-20 MED ORDER — SODIUM CHLORIDE 0.9 % IV SOLN
180.0000 mg/m2 | Freq: Once | INTRAVENOUS | Status: AC
  Administered 2021-11-20: 380 mg via INTRAVENOUS
  Filled 2021-11-20: qty 15

## 2021-11-20 NOTE — Progress Notes (Signed)
? ? ? ?Hematology/Oncology Consult note ?[place]  ?[phone] [phone] ? ?Patient Care Team: ?Venia Carbon, MD as PCP - General ?[name], RN as Oncology Nurse Navigator ?[name], MD as Consulting Physician (Hematology and Oncology)  ? ?Name of the patient: Cory [name]  ?[id number]  ?April 27, 1956  ? ?Date of visit: 11/20/21 ? 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 ?Chief complaint/ Reason for visit-on treatment assessment prior to cycle 7 of FOLFIRI chemotherapy ? ?Heme/Onc history: Patient is a 66 year old male with history of metastatic esophageal adenocarcinoma s/p progression on first-line FOLFOX trastuzumab Keytruda chemotherapy.NGS testing showed high tumor mutational burden CPS score less than 1. CD9-NRG1 fusion. BRAF 581L, CCN E1 gain.  EMLA for fusion of BX [id number], FGF 23 gain, FGF 6 gain, MET R988C, T p53 S1 50F. ERBB2 gain but no other actionable mutations repeat NGS testing at progression showed loss of HER2 expression.  He was therefore switched to FOLFIRI chemotherapy. ?  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  Case discussed with neurosurgery and this spot was thought to be difficult to biopsy.    MRI brain in December 2022 showed significant enlargement of this mass to 4 cm with associated edema in the bilateral occipital lobes and marginal hemorrhage along the left aspect of the mass.  Increased subdural hematoma along the left cerebral convexity.  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  Patient received [place] radiation since single fraction to this mass.  Patient also has an intra choroidal met for which he will be seeing retina specialist at [facility] ?  ?Also noted to have fracture of the right upper extremity possibly  pathologic and underwent palliative radiation for the same. ?  ?Disease progression in February 2023 and patient was switched to FOLFIRI chemotherapy ? ?Interval history-appetite is fair and he mostly forces himself to eat and keep up with his protein drinks.  He does have a rash in his right armpit which she has had before. ? ?ECOG PS- 1 ?Pain scale- 3 ?Opioid associated constipation- no ? ?Review of systems- Review of Systems  ?Constitutional:  Positive for malaise/fatigue. Negative for chills, fever and weight loss.  ?     Lack of appetite  ?HENT:  Negative for congestion, ear discharge and nosebleeds.   ?Eyes:  Negative for blurred vision.  ?Respiratory:  Negative for cough, hemoptysis, sputum production, shortness of breath and wheezing.   ?Cardiovascular:  Negative for chest pain, palpitations, orthopnea and claudication.  ?Gastrointestinal:  Negative for abdominal pain, blood in stool, constipation, diarrhea, heartburn, melena, nausea and vomiting.  ?Genitourinary:  Negative for dysuria, flank pain, frequency, hematuria and urgency.  ?Musculoskeletal:  Negative for back pain, joint pain and myalgias.  ?Skin:  Negative for rash.  ?Neurological:  Negative for dizziness, tingling, focal weakness, seizures, weakness and headaches.  ?Endo/Heme/Allergies:  Does not bruise/bleed easily.  ?Psychiatric/Behavioral:  Negative for depression and suicidal ideas. The patient does not have insomnia.    ? ?No Known Allergies ? ? ?Past Medical History:  ?Diagnosis Date  ? Esophageal adenocarcinoma ([place]) 06/23/2020  ? Family history of brain cancer   ? Family history of colon cancer   ? Family history of melanoma   ? Family history of stomach cancer   ? GERD (gastroesophageal reflux disease)   ?  History of kidney stones   ? Nephrolithiasis   ? [facility]  ? Pancreatitis, acute   ? Personal history of colonic polyps   ? Dr Nicolasa Ducking  ? Psoriasis   ? ? ? ?Past Surgical History:  ?Procedure Laterality Date  ? COLONOSCOPY    ?  COLONOSCOPY WITH PROPOFOL N/A 03/31/2020  ? Procedure: COLONOSCOPY WITH PROPOFOL;  Surgeon: Jonathon Bellows, MD;  Location: [facility] ENDOSCOPY;  Service: Gastroenterology;  Laterality: N/A;  ? ERCP    ? ESOPHAGOGASTRODUODENOSCOPY (EGD) WITH PROPOFOL N/A 06/19/2020  ? Procedure: ESOPHAGOGASTRODUODENOSCOPY (EGD) WITH PROPOFOL;  Surgeon: Jonathon Bellows, MD;  Location: [facility] ENDOSCOPY;  Service: Gastroenterology;  Laterality: N/A;  ? groin surgery    ? as a child  ? PORTA CATH INSERTION N/A 06/28/2020  ? Procedure: PORTA CATH INSERTION;  Surgeon: Algernon Huxley, MD;  Location: [place] [facility];  Service: Cardiovascular;  Laterality: N/A;  ? SHOULDER SURGERY    ? UPPER EXTREMITY VENOGRAPHY Right 07/24/2021  ? Procedure: UPPER EXTREMITY VENOGRAPHY;  Surgeon: Katha Cabal, MD;  Location: [place] [facility];  Service: Cardiovascular;  Laterality: Right;  ? ? ?Social History  ? ?Socioeconomic History  ? Marital status: Married  ?  Spouse name: Not on file  ? Number of children: 2  ? Years of education: Not on file  ? Highest education level: Not on file  ?Occupational History  ? 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 Smoking status: Former  ?  Types: Cigarettes  ?  Quit date: 07/08/1988  ?  Years since quitting: 33.3  ? Smokeless tobacco: Never  ?Vaping Use  ? Vaping Use: Never used  ?Substance and Sexual Activity  ? Alcohol use: No  ?  Comment: recovering alcoholic for 13 years  ? Drug use: No  ? Sexual activity: Not Currently  ?Other Topics Concern  ? Not on file  ?Social History Narrative  ? Has living will--needs to get notarized  ? Daughter should be health care POA (wife with dementia)  ? Would accept resuscitation  ? Would accept tube feeds depending on the circumstance  ? ?Social Determinants of Health  ? ?Financial Resource Strain: Not on file  ?Food Insecurity: Not on file  ?Transportation Needs: Not on file  ?Physical Activity: Not on file  ?Stress: Not on file  ?Social  Connections: Not on file  ?Intimate Partner Violence: Not on file  ? ? ?Family History  ?Problem Relation Age of Onset  ? Stomach cancer Mother   ? Diabetes Brother   ? Hypertension Brother   ? Hypertension Father   ? Prostate cancer Father   ? Colon cancer Sister   ? Cancer Other   ?     either cervical or ovarian, d. 72s  ? Brain cancer Sister   ? ? ? ?Current Outpatient Medications:  ?  fentaNYL (DURAGESIC) 25 MCG/HR, Place 1 patch onto the skin every 3 (three) days., Disp: 10 patch, Rfl: 0 ?  Oxycodone HCl 10 MG TABS, Take 1 tablet (10 mg total) by mouth every 4 (four) hours as needed., Disp: 120 tablet, Rfl: 0 ?  pantoprazole (PROTONIX) 40 MG tablet, TAKE 1 TABLET BY MOUTH TWICE A DAY BEFORE MEALS, Disp: 60 tablet, Rfl: 11 ?  silver sulfADIAZINE (SILVADENE) 1 % cream, Apply 1 application. topically 2 (two) times daily., Disp: 50 g, Rfl: 3 ?  dexamethasone (DECADRON) 4 MG tablet, Take 2 tablets (8 mg total) by mouth  daily. Start the day after chemotherapy for 2 days. Take with food., Disp: 30 tablet, Rfl: 1 ?  lidocaine-prilocaine (EMLA) cream, Apply 1 application. topically as needed. Apply small amount to port site at least 1 hour prior to it being accessed, cover with plastic wrap, Disp: 30 g, Rfl: 3 ?  loperamide (IMODIUM) 2 MG capsule, Take 1 capsule (2 mg total) by mouth as needed. Take 2 at diarrhea onset , then 1 every 2hr until 12hrs with no BM. May take 2 every 4hrs at night. If diarrhea recurs repeat. (Patient not taking: Reported on 11/20/2021), Disp: 30 capsule, Rfl: 3 ?  prochlorperazine (COMPAZINE) 10 MG tablet, Take 1 tablet (10 mg total) by mouth every 6 (six) hours as needed for nausea or vomiting. (Patient not taking: Reported on 11/20/2021), Disp: 30 tablet, Rfl: 0 ?No current facility-administered medications for this visit. ? ?Facility-Administered Medications Ordered in Other Visits:  ?  fluorouracil (ADRUCIL) 5,000 mg in sodium chloride 0.9 % 150 mL chemo infusion, 2,400 mg/m2 (Treatment  Plan Recorded), Intravenous, 1 day or 1 dose, [name], MD ?  fluorouracil (ADRUCIL) chemo injection 850 mg, 400 mg/m2 (Treatment Plan Recorded), Intravenous, Once, [name], MD ?  irinotecan (CAMPTOSAR) 380

## 2021-11-20 NOTE — Patient Instructions (Signed)
[facility] CANCER CTR AT [place]  Discharge Instructions: ?Thank you for choosing [facility] to provide your oncology and hematology care.  ?If you have a lab appointment with the [facility], please go directly to the [facility] and check in at the registration area. ? ?Wear comfortable clothing and clothing appropriate for easy access to any Portacath or PICC line.  ? ?We strive to give you quality time with your provider. You may need to reschedule your appointment if you arrive late (15 or more minutes).  Arriving late affects you and other patients whose appointments are after yours.  Also, if you miss three or more appointments without notifying the office, you may be dismissed from the clinic at the provider?s discretion.    ?  ?For prescription refill requests, have your pharmacy contact our office and allow 72 hours for refills to be completed.   ? ?Today you received the following chemotherapy and/or immunotherapy agents IRINOTECAN, LEUCOVORIN, 5 FU    ?  ?To help prevent nausea and vomiting after your treatment, we encourage you to take your nausea medication as directed. ? ?BELOW ARE SYMPTOMS THAT SHOULD BE REPORTED IMMEDIATELY: ?*FEVER GREATER THAN 100.4 F (38 ?C) OR HIGHER ?*CHILLS OR SWEATING ?*NAUSEA AND VOMITING THAT IS NOT CONTROLLED WITH YOUR NAUSEA MEDICATION ?*UNUSUAL SHORTNESS OF BREATH ?*UNUSUAL BRUISING OR BLEEDING ?*URINARY PROBLEMS (pain or burning when urinating, or frequent urination) ?*BOWEL PROBLEMS (unusual diarrhea, constipation, pain near the anus) ?TENDERNESS IN MOUTH AND THROAT WITH OR WITHOUT PRESENCE OF ULCERS (sore throat, sores in mouth, or a toothache) ?UNUSUAL RASH, SWELLING OR PAIN  ?UNUSUAL VAGINAL DISCHARGE OR ITCHING  ? ?Items with * indicate a potential emergency and should be followed up as soon as possible or go to the Emergency Department if any problems should occur. ? ?Please show the CHEMOTHERAPY ALERT CARD or IMMUNOTHERAPY ALERT  CARD at check-in to the Emergency Department and triage nurse. ? ?Should you have questions after your visit or need to cancel or reschedule your appointment, please contact [facility] CANCER [place] AT [place]  [phone] and follow the prompts.  Office hours are 8:00 a.m. to 4:30 p.m. Monday - Friday. Please note that voicemails left after 4:00 p.m. may not be returned until the following business day.  We are closed weekends and major holidays. You have access to a nurse at all times for urgent questions. Please call the main number to the clinic [phone] and follow the prompts. ? ?For any non-urgent questions, you may also contact your provider using MyChart. We now offer e-Visits for anyone 37 and older to request care online for non-urgent symptoms. For details visit [email or web address]. ?  ?Also download the MyChart app! Go to the app store, search "MyChart", open the app, select [facility], and log in with your MyChart username and password. ? ?Due to Covid, a mask is required upon entering the hospital/clinic. If you do not have a mask, one will be given to you upon arrival. For doctor visits, patients may have 1 support person aged 78 or older with them. For treatment visits, patients cannot have anyone with them due to current Covid guidelines and our immunocompromised population.  ? ?Irinotecan injection ?What is this medication? ?IRINOTECAN (ir in oh TEE kan ) is a chemotherapy drug. It is used to treat colon and rectal cancer. ?This medicine may be used for other purposes; ask your health care provider or pharmacist if you have questions. ?COMMON BRAND NAME(S): Camptosar ?What should I  tell my care team before I take this medication? ?They need to know if you have any of these conditions: ?dehydration ?diarrhea ?infection (especially a virus infection such as chickenpox, cold sores, or herpes) ?liver disease ?low blood counts, like low white cell, platelet, or red cell counts ?low  levels of calcium, magnesium, or potassium in the blood ?recent or ongoing radiation therapy ?an unusual or allergic reaction to irinotecan, other medicines, foods, dyes, or preservatives ?pregnant or trying to get pregnant ?breast-feeding ?How should I use this medication? ?This drug is given as an infusion into a vein. It is administered in a hospital or clinic by a specially trained health care professional. ?Talk to your pediatrician regarding the use of this medicine in children. Special care may be needed. ?Overdosage: If you think you have taken too much of this medicine contact a poison control center or emergency room at once. ?NOTE: This medicine is only for you. Do not share this medicine with others. ?What if I miss a dose? ?It is important not to miss your dose. Call your doctor or health care professional if you are unable to keep an appointment. ?What may interact with this medication? ?Do not take this medicine with any of the following medications: ?cobicistat ?itraconazole ?This medicine may interact with the following medications: ?antiviral medicines for HIV or AIDS ?certain antibiotics like rifampin or rifabutin ?certain medicines for fungal infections like ketoconazole, posaconazole, and voriconazole ?certain medicines for seizures like carbamazepine, phenobarbital, phenotoin ?clarithromycin ?gemfibrozil ?nefazodone ?[place] ?This list may not describe all possible interactions. Give your health care provider a list of all the medicines, herbs, non-prescription drugs, or dietary supplements you use. Also tell them if you smoke, drink alcohol, or use illegal drugs. Some items may interact with your medicine. ?What should I watch for while using this medication? ?Your condition will be monitored carefully while you are receiving this medicine. You will need important blood work done while you are taking this medicine. ?This drug may make you feel generally unwell. This is not uncommon, as  chemotherapy can affect healthy cells as well as cancer cells. Report any side effects. Continue your course of treatment even though you feel ill unless your doctor tells you to stop. ?In some cases, you may be given additional medicines to help with side effects. Follow all directions for their use. ?You may get drowsy or dizzy. Do not drive, use machinery, or do anything that needs mental alertness until you know how this medicine affects you. Do not stand or sit up quickly, especially if you are an older patient. This reduces the risk of dizzy or fainting spells. ?Call your health care professional for advice if you get a fever, chills, or sore throat, or other symptoms of a cold or flu. Do not treat yourself. This medicine decreases your body's ability to fight infections. Try to avoid being around people who are sick. ?Avoid taking products that contain aspirin, acetaminophen, ibuprofen, naproxen, or ketoprofen unless instructed by your doctor. These medicines may hide a fever. ?This medicine may increase your risk to bruise or bleed. Call your doctor or health care professional if you notice any unusual bleeding. ?Be careful brushing and flossing your teeth or using a toothpick because you may get an infection or bleed more easily. If you have any dental work done, tell your dentist you are receiving this medicine. ?Do not become pregnant while taking this medicine or for 6 months after stopping it. Women should inform their  health care professional if they wish to become pregnant or think they might be pregnant. Men should not father a child while taking this medicine and for 3 months after stopping it. There is potential for serious side effects to an unborn child. Talk to your health care professional for more information. ?Do not breast-feed an infant while taking this medicine or for 7 days after stopping it. ?This medicine has caused ovarian failure in some women. This medicine may make it more  difficult to get pregnant. Talk to your health care professional if you are concerned about your fertility. ?This medicine has caused decreased sperm counts in some men. This may make it more difficult to father

## 2021-11-20 NOTE — Progress Notes (Signed)
Nutrition Follow-up: ? 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  Patient with disease progression in  Feb 2023 and treatment changed to folfiri.   ? ?Met with patient today during infusion.  Patient reports that he purchased the Boost VHC shakes and drinks 1 in the morning and his other boost plus shakes later in the day.  Does not have an appetite but he is still trying to eat.  Usually eats grits, eggs, sausage, waffles each morning.  Says that it is hard to think of foods to eat and cook.  Eating banana sandwiches for lunch as does not have much appetite for lunch meat.   ? ?Denies nausea ? ?Medications: reviewed ? ?Labs: reviewed ? ?Anthropometrics:  ? ?Weight 169 lb 3.2 oz today ? ?171 lb 3.2 oz on 5/2 ?198 lb on 06/19/21 ? ? ?NUTRITION DIAGNOSIS: Inadequate oral intake continues ? ? ? ?INTERVENTION:  ?Continue boost VHC and can increase  ?Discussed sandwich options with higher calories protein (pimento cheese, chicken salad, grilled cheese, peanut butter and jelly) ?  ? ?MONITORING, EVALUATION, GOAL: weight trends, intake ? ? ?NEXT VISIT: Wednesday, May 31 during infusion ? ?Birgitta Uhlir B. Zenia Resides, RD, LDN ?Registered Dietitian ?[phone] ? ? ?

## 2021-11-20 NOTE — Progress Notes (Signed)
Pt said that he does not have appetite, he does eat 3 times a day. Drinks different boost and protein drinks 3 times a day. He has rash back under his right arm pit.  ?

## 2021-11-21 LAB — CEA: CEA: 105 ng/mL — ABNORMAL HIGH (ref 0.0–4.7)

## 2021-11-22 ENCOUNTER — Inpatient Hospital Stay: Payer: Medicare Other

## 2021-11-22 DIAGNOSIS — C159 Malignant neoplasm of esophagus, unspecified: Secondary | ICD-10-CM

## 2021-11-22 DIAGNOSIS — Z5111 Encounter for antineoplastic chemotherapy: Secondary | ICD-10-CM | POA: Diagnosis not present

## 2021-11-22 MED ORDER — SODIUM CHLORIDE 0.9% FLUSH
10.0000 mL | INTRAVENOUS | Status: DC | PRN
  Administered 2021-11-22: 10 mL
  Filled 2021-11-22: qty 10

## 2021-11-22 MED ORDER — PEGFILGRASTIM-CBQV 6 MG/0.6ML ~~LOC~~ SOSY
6.0000 mg | PREFILLED_SYRINGE | Freq: Once | SUBCUTANEOUS | Status: AC
  Administered 2021-11-22: 6 mg via SUBCUTANEOUS
  Filled 2021-11-22: qty 0.6

## 2021-11-22 MED ORDER — HEPARIN SOD (PORK) LOCK FLUSH 100 UNIT/ML IV SOLN
500.0000 [IU] | Freq: Once | INTRAVENOUS | Status: AC | PRN
  Administered 2021-11-22: 500 [IU]
  Filled 2021-11-22: qty 5

## 2021-11-26 ENCOUNTER — Telehealth: Payer: Self-pay | Admitting: *Deleted

## 2021-11-26 ENCOUNTER — Other Ambulatory Visit: Payer: Self-pay | Admitting: *Deleted

## 2021-11-26 MED ORDER — OXYCODONE HCL 10 MG PO TABS: 10.0000 mg | ORAL_TABLET | ORAL | 0 refills | Status: DC | PRN

## 2021-11-26 NOTE — Telephone Encounter (Signed)
Pt wants refill of pain meds oxycodone and MMW. Both has been sent in to [facility]

## 2021-11-29 ENCOUNTER — Telehealth: Payer: Self-pay

## 2021-11-29 ENCOUNTER — Other Ambulatory Visit: Payer: Self-pay | Admitting: *Deleted

## 2021-11-29 MED ORDER — FENTANYL 25 MCG/HR TD PT72: 1.0000 | MEDICATED_PATCH | TRANSDERMAL | 0 refills | Status: DC

## 2021-11-29 NOTE — Telephone Encounter (Signed)
Pt called and stated he is not sure how his appointments were scheduled on a Wednesday, he normally comes in on Tuesdays. Not able to come on Wednesdays due to hospice coming out for his wife and he is the only one there with her. Also, stated he went to pick up MMW and since on [place] did not have it available. [facility] was able to fill [facility] but would cost $70 and he states he can not afford that. Asking for an alternative if available. Please advise.

## 2021-11-30 MED FILL — Fluorouracil IV Soln 5 GM/100ML (50 MG/ML): INTRAVENOUS | Qty: 100 | Status: AC

## 2021-11-30 MED FILL — Fluorouracil IV Soln 2.5 GM/50ML (50 MG/ML): INTRAVENOUS | Qty: 17 | Status: AC

## 2021-11-30 MED FILL — Dexamethasone Sodium Phosphate Inj 100 MG/10ML: INTRAMUSCULAR | Qty: 1 | Status: AC

## 2021-12-04 ENCOUNTER — Inpatient Hospital Stay (HOSPITAL_BASED_OUTPATIENT_CLINIC_OR_DEPARTMENT_OTHER): Payer: Medicare Other | Admitting: Oncology

## 2021-12-04 ENCOUNTER — Encounter: Payer: Self-pay | Admitting: Oncology

## 2021-12-04 ENCOUNTER — Inpatient Hospital Stay: Payer: Medicare Other

## 2021-12-04 VITALS — BP 120/64 | HR 80 | Temp 98.1°F | Resp 18 | Wt 167.2 lb

## 2021-12-04 DIAGNOSIS — Z5111 Encounter for antineoplastic chemotherapy: Secondary | ICD-10-CM

## 2021-12-04 DIAGNOSIS — G893 Neoplasm related pain (acute) (chronic): Secondary | ICD-10-CM | POA: Diagnosis not present

## 2021-12-04 DIAGNOSIS — C159 Malignant neoplasm of esophagus, unspecified: Secondary | ICD-10-CM

## 2021-12-04 LAB — COMPREHENSIVE METABOLIC PANEL
ALT: 23 U/L (ref 0–44)
AST: 32 U/L (ref 15–41)
Albumin: 3.9 g/dL (ref 3.5–5.0)
Alkaline Phosphatase: 206 U/L — ABNORMAL HIGH (ref 38–126)
Anion gap: 8 (ref 5–15)
BUN: 13 mg/dL (ref 8–23)
CO2: 22 mmol/L (ref 22–32)
Calcium: 8.9 mg/dL (ref 8.9–10.3)
Chloride: 108 mmol/L (ref 98–111)
Creatinine, Ser: 0.81 mg/dL (ref 0.61–1.24)
GFR, Estimated: >60 mL/min (ref >60)
Glucose, Bld: 142 mg/dL — ABNORMAL HIGH (ref 70–99)
Potassium: 3.7 mmol/L (ref 3.5–5.1)
Sodium: 138 mmol/L (ref 135–145)
Total Bilirubin: 1.4 mg/dL — ABNORMAL HIGH (ref 0.3–1.2)
Total Protein: 6.3 g/dL — ABNORMAL LOW (ref 6.5–8.1)

## 2021-12-04 LAB — CBC WITH DIFFERENTIAL/PLATELET
Abs Immature Granulocytes: 0.13 10*3/uL — ABNORMAL HIGH (ref 0.00–0.07)
Basophils Absolute: 0 10*3/uL (ref 0.0–0.1)
Basophils Relative: 1 %
Eosinophils Absolute: 0 10*3/uL (ref 0.0–0.5)
Eosinophils Relative: 1 %
HCT: 36.5 % — ABNORMAL LOW (ref 39.0–52.0)
Hemoglobin: 11.6 g/dL — ABNORMAL LOW (ref 13.0–17.0)
Immature Granulocytes: 3 %
Lymphocytes Relative: 19 %
Lymphs Abs: 1 10*3/uL (ref 0.7–4.0)
MCH: 29.7 pg (ref 26.0–34.0)
MCHC: 31.8 g/dL (ref 30.0–36.0)
MCV: 93.6 fL (ref 80.0–100.0)
Monocytes Absolute: 0.5 10*3/uL (ref 0.1–1.0)
Monocytes Relative: 10 %
Neutro Abs: 3.6 10*3/uL (ref 1.7–7.7)
Neutrophils Relative %: 66 %
Platelets: 207 10*3/uL (ref 150–400)
RBC: 3.9 MIL/uL — ABNORMAL LOW (ref 4.22–5.81)
RDW: 15.9 % — ABNORMAL HIGH (ref 11.5–15.5)
WBC: 5.3 10*3/uL (ref 4.0–10.5)
nRBC: 0 % (ref 0.0–0.2)

## 2021-12-04 MED ORDER — SODIUM CHLORIDE 0.9% FLUSH
10.0000 mL | Freq: Once | INTRAVENOUS | Status: AC
  Administered 2021-12-04: 10 mL via INTRAVENOUS
  Filled 2021-12-04: qty 10

## 2021-12-04 MED ORDER — SODIUM CHLORIDE 0.9 % IV SOLN
10.0000 mg | Freq: Once | INTRAVENOUS | Status: AC
  Administered 2021-12-04: 10 mg via INTRAVENOUS
  Filled 2021-12-04: qty 10

## 2021-12-04 MED ORDER — SODIUM CHLORIDE 0.9 % IV SOLN
2400.0000 mg/m2 | INTRAVENOUS | Status: DC
  Administered 2021-12-04: 5000 mg via INTRAVENOUS
  Filled 2021-12-04: qty 100

## 2021-12-04 MED ORDER — FLUOROURACIL CHEMO INJECTION 2.5 GM/50ML
400.0000 mg/m2 | Freq: Once | INTRAVENOUS | Status: AC
  Administered 2021-12-04: 850 mg via INTRAVENOUS
  Filled 2021-12-04: qty 17

## 2021-12-04 MED ORDER — ATROPINE SULFATE 1 MG/ML IV SOLN
0.5000 mg | Freq: Once | INTRAVENOUS | Status: AC | PRN
  Administered 2021-12-04: 0.5 mg via INTRAVENOUS
  Filled 2021-12-04: qty 1

## 2021-12-04 MED ORDER — SODIUM CHLORIDE 0.9 % IV SOLN
400.0000 mg/m2 | Freq: Once | INTRAVENOUS | Status: AC
  Administered 2021-12-04: 832 mg via INTRAVENOUS
  Filled 2021-12-04: qty 41.6

## 2021-12-04 MED ORDER — PALONOSETRON HCL INJECTION 0.25 MG/5ML
0.2500 mg | Freq: Once | INTRAVENOUS | Status: AC
  Administered 2021-12-04: 0.25 mg via INTRAVENOUS
  Filled 2021-12-04: qty 5

## 2021-12-04 MED ORDER — SODIUM CHLORIDE 0.9 % IV SOLN
Freq: Once | INTRAVENOUS | Status: AC
  Filled 2021-12-04: qty 250

## 2021-12-04 MED ORDER — SODIUM CHLORIDE 0.9 % IV SOLN
180.0000 mg/m2 | Freq: Once | INTRAVENOUS | Status: AC
  Administered 2021-12-04: 380 mg via INTRAVENOUS
  Filled 2021-12-04: qty 15

## 2021-12-04 MED ORDER — SODIUM CHLORIDE 0.9 % IV SOLN
Freq: Once | INTRAVENOUS | Status: DC
  Filled 2021-12-04: qty 250

## 2021-12-04 NOTE — Progress Notes (Signed)
Pt states that Sunday after church he felt very tired, went to take a nap becasuehe felt warm (did not take temp) and experienced muscle spasms on left arm. However, felt better after nap.

## 2021-12-04 NOTE — Patient Instructions (Signed)
[facility] AT [place]  Discharge Instructions: ?Thank you for choosing [facility] to provide your oncology and hematology care.  ?If you have a lab appointment with the Cancer Center, please go directly to the Cancer Center and check in at the registration area. ? ?Wear comfortable clothing and clothing appropriate for easy access to any Portacath or PICC line.  ? ?We strive to give you quality time with your provider. You may need to reschedule your appointment if you arrive late (15 or more minutes).  Arriving late affects you and other patients whose appointments are after yours.  Also, if you miss three or more appointments without notifying the office, you may be dismissed from the clinic at the provider?s discretion.    ?  ?For prescription refill requests, have your pharmacy contact our office and allow 72 hours for refills to be completed.   ? ?  ?To help prevent nausea and vomiting after your treatment, we encourage you to take your nausea medication as directed. ? ?BELOW ARE SYMPTOMS THAT SHOULD BE REPORTED IMMEDIATELY: ?*FEVER GREATER THAN 100.4 F (38 ?C) OR HIGHER ?*CHILLS OR SWEATING ?*NAUSEA AND VOMITING THAT IS NOT CONTROLLED WITH YOUR NAUSEA MEDICATION ?*UNUSUAL SHORTNESS OF BREATH ?*UNUSUAL BRUISING OR BLEEDING ?*URINARY PROBLEMS (pain or burning when urinating, or frequent urination) ?*BOWEL PROBLEMS (unusual diarrhea, constipation, pain near the anus) ?TENDERNESS IN MOUTH AND THROAT WITH OR WITHOUT PRESENCE OF ULCERS (sore throat, sores in mouth, or a toothache) ?UNUSUAL RASH, SWELLING OR PAIN  ?UNUSUAL VAGINAL DISCHARGE OR ITCHING  ? ?Items with * indicate a potential emergency and should be followed up as soon as possible or go to the Emergency Department if any problems should occur. ? ?Please show the CHEMOTHERAPY ALERT CARD or IMMUNOTHERAPY ALERT CARD at check-in to the Emergency Department and triage nurse. ? ?Should you have questions after your visit  or need to cancel or reschedule your appointment, please contact [facility] [place]  [phone] and follow the prompts.  Office hours are 8:00 a.m. to 4:30 p.m. Monday - Friday. Please note that voicemails left after 4:00 p.m. may not be returned until the following business day.  We are closed weekends and major holidays. You have access to a nurse at all times for urgent questions. Please call the main number to the clinic [phone] and follow the prompts. ? ?For any non-urgent questions, you may also contact your provider using MyChart. We now offer e-Visits for anyone 18 and older to request care online for non-urgent symptoms. For details visit [email or web address]. ?  ?Also download the MyChart app! Go to the app store, search "MyChart", open the app, select [facility], and log in with your MyChart username and password. ? ?Due to Covid, a mask is required upon entering the hospital/clinic. If you do not have a mask, one will be given to you upon arrival. For doctor visits, patients may have 1 support person aged 18 or older with them. For treatment visits, patients cannot have anyone with them due to current Covid guidelines and our immunocompromised population.  ?

## 2021-12-04 NOTE — Progress Notes (Signed)
Hematology/Oncology Consult note [facility]  [phone] [phone]  Patient Care Team: Venia Carbon, MD as PCP - General [name], RN as Oncology Nurse Navigator [name], MD as Consulting Physician (Hematology and Oncology)   Name of the patient: Cory [name]  [id number]  [birth year]   Date of visit: 12/04/21  Diagnosis- metastatic esophageal cancer with liver metastases    Chief complaint/ Reason for visit-on treatment assessment prior to cycle 8 of FOLFIRI chemotherapy  Heme/Onc history: Patient is a 66 year old male with history of metastatic esophageal adenocarcinoma s/p progression on first-line FOLFOX trastuzumab Keytruda chemotherapy.NGS testing showed high tumor mutational burden CPS score less than 1. CD9-NRG1 fusion. BRAF 581L, CCN E1 gain.  EMLA for fusion of BX [id number], FGF 23 gain, FGF 6 gain, MET R988C, T p53 S1 74F. ERBB2 gain but no other actionable mutations repeat NGS testing at progression showed loss of HER2 expression.  He was therefore switched to FOLFIRI chemotherapy.   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  Case discussed with neurosurgery and this spot was thought to be difficult to biopsy.    MRI brain in December 2022 showed significant enlargement of this mass to 4 cm with associated edema in the bilateral occipital lobes and marginal hemorrhage along the left aspect of the mass.  Increased subdural hematoma along the left cerebral convexity.  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  Patient received [place] radiation since single fraction to this mass.  Patient also has an intra choroidal met for which he will be seeing retina specialist at [facility]   Also noted to have fracture of the right upper extremity  possibly pathologic and underwent palliative radiation for the same.   Disease progression in February 2023 and patient was switched to FOLFIRI chemotherapy  Interval history-reports ongoing fatigue.  Appetite is fair and weight is stable around 167 pounds.  Skin rash over his right armpit is resolved.  Pain is well controlled.  Bowel movements are regular  ECOG PS- 1 Pain scale- 0 Opioid associated constipation- no  Review of systems- Review of Systems  Constitutional:  Positive for malaise/fatigue. Negative for chills, fever and weight loss.  HENT:  Negative for congestion, ear discharge and nosebleeds.   Eyes:  Negative for blurred vision.  Respiratory:  Negative for cough, hemoptysis, sputum production, shortness of breath and wheezing.   Cardiovascular:  Negative for chest pain, palpitations, orthopnea and claudication.  Gastrointestinal:  Negative for abdominal pain, blood in stool, constipation, diarrhea, heartburn, melena, nausea and vomiting.  Genitourinary:  Negative for dysuria, flank pain, frequency, hematuria and urgency.  Musculoskeletal:  Negative for back pain, joint pain and myalgias.  Skin:  Negative for rash.  Neurological:  Negative for dizziness, tingling, focal weakness, seizures, weakness and headaches.  Endo/Heme/Allergies:  Does not bruise/bleed easily.  Psychiatric/Behavioral:  Negative for depression and suicidal ideas. The patient does not have insomnia.      No Known Allergies   Past Medical History:  Diagnosis Date   Esophageal adenocarcinoma ([place]) 06/23/2020   Family history of brain cancer    Family history of colon cancer    Family history of melanoma    Family history of stomach cancer    GERD (gastroesophageal reflux disease)    History of kidney stones  Nephrolithiasis    [facility]   Pancreatitis, acute    Personal history of colonic polyps    Dr Nicolasa Ducking   Psoriasis      Past Surgical History:  Procedure Laterality Date   COLONOSCOPY      COLONOSCOPY WITH PROPOFOL N/A 03/31/2020   Procedure: COLONOSCOPY WITH PROPOFOL;  Surgeon: Jonathon Bellows, MD;  Location: [facility] ENDOSCOPY;  Service: Gastroenterology;  Laterality: N/A;   ERCP     ESOPHAGOGASTRODUODENOSCOPY (EGD) WITH PROPOFOL N/A 06/19/2020   Procedure: ESOPHAGOGASTRODUODENOSCOPY (EGD) WITH PROPOFOL;  Surgeon: Jonathon Bellows, MD;  Location: [facility] ENDOSCOPY;  Service: Gastroenterology;  Laterality: N/A;   groin surgery     as a child   PORTA CATH INSERTION N/A 06/28/2020   Procedure: PORTA CATH INSERTION;  Surgeon: Algernon Huxley, MD;  Location: [place] CV LAB;  Service: Cardiovascular;  Laterality: N/A;   SHOULDER SURGERY     UPPER EXTREMITY VENOGRAPHY Right 07/24/2021   Procedure: UPPER EXTREMITY VENOGRAPHY;  Surgeon: Katha Cabal, MD;  Location: [place] CV LAB;  Service: Cardiovascular;  Laterality: Right;    Social History   Socioeconomic History   Marital status: Married    Spouse name: Not on file   Number of children: 2   Years of education: Not on file   Highest education level: Not on file  Occupational History   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  Tobacco Use   Smoking status: Former    Types: Cigarettes    Quit date: 07/08/1988    Years since quitting: 33.4   Smokeless tobacco: Never  Vaping Use   Vaping Use: Never used  Substance and Sexual Activity   Alcohol use: No    Comment: recovering alcoholic for 13 years   Drug use: No   Sexual activity: Not Currently  Other Topics Concern   Not on file  Social History Narrative   Has living will--needs to get notarized   Daughter should be health care POA (wife with dementia)   Would accept resuscitation   Would accept tube feeds depending on the circumstance   Social Determinants of Health   Financial Resource Strain: Not on file  Food Insecurity: Not on file  Transportation Needs: Not on file  Physical Activity: Not on file  Stress: Not on file  Social  Connections: Not on file  Intimate Partner Violence: Not on file    Family History  Problem Relation Age of Onset   Stomach cancer Mother    Diabetes Brother    Hypertension Brother    Hypertension Father    Prostate cancer Father    Colon cancer Sister    Cancer Other        either cervical or ovarian, d. 42s   Brain cancer Sister      Current Outpatient Medications:    dexamethasone (DECADRON) 4 MG tablet, Take 2 tablets (8 mg total) by mouth daily. Start the day after chemotherapy for 2 days. Take with food., Disp: 30 tablet, Rfl: 1   fentaNYL (DURAGESIC) 25 MCG/HR, Place 1 patch onto the skin every 3 (three) days., Disp: 10 patch, Rfl: 0   lidocaine-prilocaine (EMLA) cream, Apply 1 application. topically as needed. Apply small amount to port site at least 1 hour prior to it being accessed, cover with plastic wrap, Disp: 30 g, Rfl: 3   Oxycodone HCl 10 MG TABS, Take 1 tablet (10 mg total) by mouth every 4 (four) hours as needed., Disp: 120 tablet,  Rfl: 0   pantoprazole (PROTONIX) 40 MG tablet, TAKE 1 TABLET BY MOUTH TWICE A DAY BEFORE MEALS, Disp: 60 tablet, Rfl: 11   silver sulfADIAZINE (SILVADENE) 1 % cream, Apply 1 application. topically 2 (two) times daily., Disp: 50 g, Rfl: 3   loperamide (IMODIUM) 2 MG capsule, Take 1 capsule (2 mg total) by mouth as needed. Take 2 at diarrhea onset , then 1 every 2hr until 12hrs with no BM. May take 2 every 4hrs at night. If diarrhea recurs repeat. (Patient not taking: Reported on 11/20/2021), Disp: 30 capsule, Rfl: 3   prochlorperazine (COMPAZINE) 10 MG tablet, Take 1 tablet (10 mg total) by mouth every 6 (six) hours as needed for nausea or vomiting. (Patient not taking: Reported on 11/20/2021), Disp: 30 tablet, Rfl: 0 No current facility-administered medications for this visit.  Facility-Administered Medications Ordered in Other Visits:    0.9 %  sodium chloride infusion, , Intravenous, Once, [name], MD   fluorouracil (ADRUCIL) 5,000  mg in sodium chloride 0.9 % 150 mL chemo infusion, 2,400 mg/m2 (Treatment Plan Recorded), Intravenous, 1 day or 1 dose, [name], MD, 5,000 mg at 12/04/21 1210  Physical exam:  Vitals:   12/04/21 0825  BP: 120/64  Pulse: 80  Resp: 18  Temp: 98.1 F (36.7 C)  SpO2: 100%  Weight: 167 lb 3.2 oz (75.8 kg)   Physical Exam Constitutional:      General: He is not in acute distress. Cardiovascular:     Rate and Rhythm: Normal rate and regular rhythm.     Heart sounds: Normal heart sounds.  Pulmonary:     Effort: Pulmonary effort is normal.     Breath sounds: Normal breath sounds.  Skin:    General: Skin is warm and dry.  Neurological:     Mental Status: He is alert and oriented to person, place, and time.        Latest Ref Rng & Units 12/04/2021    7:56 AM  CMP  Glucose 70 - 99 mg/dL 142    BUN 8 - 23 mg/dL 13    Creatinine 0.61 - 1.24 mg/dL 0.81    Sodium 135 - 145 mmol/L 138    Potassium 3.5 - 5.1 mmol/L 3.7    Chloride 98 - 111 mmol/L 108    CO2 22 - 32 mmol/L 22    Calcium 8.9 - 10.3 mg/dL 8.9    Total Protein 6.5 - 8.1 g/dL 6.3    Total Bilirubin 0.3 - 1.2 mg/dL 1.4    Alkaline Phos 38 - 126 U/L 206    AST 15 - 41 U/L 32    ALT 0 - 44 U/L 23        Latest Ref Rng & Units 12/04/2021    7:56 AM  CBC  WBC 4.0 - 10.5 K/uL 5.3    Hemoglobin 13.0 - 17.0 g/dL 11.6    Hematocrit 39.0 - 52.0 % 36.5    Platelets 150 - 400 K/uL 207      No images are attached to the encounter.  ECHOCARDIOGRAM COMPLETE  Result Date: 11/05/2021    ECHOCARDIOGRAM REPORT   Patient Name:   MAXIMILIEN HAYASHI Date of Exam: 11/05/2021 Medical Rec #:  [id number]      Height:       71.0 in Accession #:    [id number]     Weight:       171.6 lb Date of Birth:  04-09-1956  BSA:          1.976 m Patient Age:    54 years       BP:           134/72 mmHg Patient Gender: M              HR:           81 bpm. Exam Location:  [facility] Procedure: 2D Echo, 3D Echo, Cardiac Doppler, Color Doppler and  Intracardiac            Opacification Agent Indications:    I51.89 (ICD-10-CM) - Mass of left cardiac ventricle  History:        Patient has prior history of Echocardiogram examinations, most                 recent 07/25/2021. Risk Factors:Former Smoker and Chemotherapy.  Sonographer:    Caesar Chestnut Referring Phys: [id number] MUHAMMAD A ARIDA IMPRESSIONS  1. LV mass noted in the lateral apex measuring 1.3 x 1.4 cm.. Left ventricular ejection fraction, by estimation, is 50 to 55%. Left ventricular ejection fraction by 2D MOD biplane is 50.4 %. The left ventricle has low normal function. The left ventricle  has no regional wall motion abnormalities. Left ventricular diastolic parameters are consistent with Grade I diastolic dysfunction (impaired relaxation).  2. Right ventricular systolic function is normal. The right ventricular size is normal.  3. The mitral valve is normal in structure. No evidence of mitral valve regurgitation.  4. The aortic valve is tricuspid. Aortic valve regurgitation is trivial.  5. The inferior vena cava is normal in size with greater than 50% respiratory variability, suggesting right atrial pressure of 3 mmHg. Comparison(s): LVEF 50-55%, LV Mass 1.7x1.6cm, Asc Aorta 3.8 cm. FINDINGS  Left Ventricle: LV mass noted in the lateral apex measuring 1.3 x 1.4 cm. Left ventricular ejection fraction, by estimation, is 50 to 55%. Left ventricular ejection fraction by 2D MOD biplane is 50.4 %. The left ventricle has low normal function. The left ventricle has no regional wall motion abnormalities. Definity contrast agent was given IV to delineate the left ventricular endocardial borders. The left ventricular internal cavity size was normal in size. There is no left ventricular hypertrophy. Left ventricular diastolic parameters are consistent with Grade I diastolic dysfunction (impaired relaxation). Right Ventricle: The right ventricular size is normal. No increase in right ventricular wall thickness. Right  ventricular systolic function is normal. Left Atrium: Left atrial size was normal in size. Right Atrium: Right atrial size was normal in size. Pericardium: There is no evidence of pericardial effusion. Mitral Valve: The mitral valve is normal in structure. No evidence of mitral valve regurgitation. Tricuspid Valve: The tricuspid valve is not well visualized. Tricuspid valve regurgitation is trivial. Aortic Valve: The aortic valve is tricuspid. Aortic valve regurgitation is trivial. Aortic valve mean gradient measures 5.0 mmHg. Aortic valve peak gradient measures 9.9 mmHg. Aortic valve area, by VTI measures 2.62 cm. Pulmonic Valve: The pulmonic valve was not well visualized. Pulmonic valve regurgitation is not visualized. Aorta: The aortic root and ascending aorta are structurally normal, with no evidence of dilitation. Venous: The inferior vena cava is normal in size with greater than 50% respiratory variability, suggesting right atrial pressure of 3 mmHg. IAS/Shunts: No atrial level shunt detected by color flow Doppler.  LEFT VENTRICLE PLAX 2D                        Biplane EF (MOD)  LVIDd:         5.30 cm         LV Biplane EF:   Left LVIDs:         3.90 cm                          ventricular LV PW:         1.20 cm                          ejection LV IVS:        0.90 cm                          fraction by LVOT diam:     2.10 cm                          2D MOD LV SV:         85                               biplane is LV SV Index:   43                               50.4 %. LVOT Area:     3.46 cm                                Diastology                                LV e' medial:    7.18 cm/s LV Volumes (MOD)               LV E/e' medial:  11.4 LV vol d, MOD    141.0 ml      LV e' lateral:   9.55 cm/s A2C:                           LV E/e' lateral: 8.5 LV vol d, MOD    155.0 ml A4C: LV vol s, MOD    76.0 ml A2C: LV vol s, MOD    69.8 ml A4C: LV SV MOD A2C:   65.0 ml LV SV MOD A4C:   155.0 ml LV SV MOD BP:     76.0 ml RIGHT VENTRICLE RV Basal diam:  4.00 cm RV Mid diam:    2.20 cm RV S prime:     18.20 cm/s TAPSE (M-mode): 3.4 cm LEFT ATRIUM             Index        RIGHT ATRIUM           Index LA diam:        3.00 cm 1.52 cm/m   RA Area:     17.00 cm LA Vol (A2C):   53.4 ml 27.03 ml/m  RA Volume:   42.30 ml  21.41 ml/m LA Vol (A4C):   52.1 ml 26.37 ml/m LA Biplane Vol: 53.1 ml 26.88 ml/m  AORTIC VALVE  PULMONIC VALVE AV Area (Vmax):    2.47 cm      PV Vmax:       0.99 m/s AV Area (Vmean):   2.46 cm      PV Peak grad:  3.9 mmHg AV Area (VTI):     2.62 cm AV Vmax:           157.00 cm/s AV Vmean:          108.000 cm/s AV VTI:            0.324 m AV Peak Grad:      9.9 mmHg AV Mean Grad:      5.0 mmHg LVOT Vmax:         112.00 cm/s LVOT Vmean:        76.600 cm/s LVOT VTI:          0.245 m LVOT/AV VTI ratio: 0.76 AR Vena Contracta: 0.60 cm  AORTA Ao Root diam: 3.60 cm Ao Asc diam:  3.30 cm MITRAL VALVE                TRICUSPID VALVE MV Area (PHT): 2.16 cm     TR Peak grad:   22.7 mmHg MV Decel Time: 351 msec     TR Vmax:        238.00 cm/s MV E velocity: 81.50 cm/s MV A velocity: 125.00 cm/s  SHUNTS MV E/A ratio:  0.65         Systemic VTI:  0.24 m                             Systemic Diam: 2.10 cm Kate Sable MD Electronically signed by Kate Sable MD Signature Date/Time: 11/05/2021/5:11:53 PM    Final      Assessment and plan- Patient is a 66 y.o. male  with metastatic esophageal adenocarcinoma with bone and lymph node metastases.  He is here for on treatment assessment prior to cycle 8 of FOLFIRI chemotherapy  Counts okay to proceed with cycle 8 of FOLFIRI chemotherapy today with Udenyca on day 3.  Hemoglobin is overall stable around 11.  CEA from today is pending but overall trending down.  His lowest level was a month ago at 75 and the next level was upper 105.  We will continue to follow.  He will be due for repeat scan sometime in early July 2023.  I will see him back in 2  weeks for cycle 9 of chemotherapy  Bone metastases:  Neoplasm related pain:Continue fentanyl patch and as needed oxycodone   Visit Diagnosis 1. Esophageal adenocarcinoma ([place])   2. Encounter for antineoplastic chemotherapy   3. Neoplasm related pain      Dr. Randa Evens, MD, MPH [facility] at [facility] [phone] 12/04/2021 1:28 PM

## 2021-12-05 ENCOUNTER — Ambulatory Visit: Payer: Medicare Other

## 2021-12-05 ENCOUNTER — Inpatient Hospital Stay: Payer: Medicare Other

## 2021-12-05 ENCOUNTER — Other Ambulatory Visit: Payer: Medicare Other

## 2021-12-05 ENCOUNTER — Ambulatory Visit: Payer: Medicare Other | Admitting: Oncology

## 2021-12-05 LAB — CEA: CEA: 135 ng/mL — ABNORMAL HIGH (ref 0.0–4.7)

## 2021-12-06 ENCOUNTER — Inpatient Hospital Stay: Payer: Medicare Other | Attending: Oncology

## 2021-12-06 VITALS — BP 137/64 | HR 63 | Temp 97.9°F | Resp 16

## 2021-12-06 DIAGNOSIS — C159 Malignant neoplasm of esophagus, unspecified: Secondary | ICD-10-CM

## 2021-12-06 DIAGNOSIS — G893 Neoplasm related pain (acute) (chronic): Secondary | ICD-10-CM | POA: Diagnosis not present

## 2021-12-06 DIAGNOSIS — I6200 Nontraumatic subdural hemorrhage, unspecified: Secondary | ICD-10-CM | POA: Insufficient documentation

## 2021-12-06 DIAGNOSIS — Z5111 Encounter for antineoplastic chemotherapy: Secondary | ICD-10-CM | POA: Insufficient documentation

## 2021-12-06 DIAGNOSIS — Z79899 Other long term (current) drug therapy: Secondary | ICD-10-CM | POA: Diagnosis not present

## 2021-12-06 DIAGNOSIS — Z87891 Personal history of nicotine dependence: Secondary | ICD-10-CM | POA: Insufficient documentation

## 2021-12-06 DIAGNOSIS — C155 Malignant neoplasm of lower third of esophagus: Secondary | ICD-10-CM | POA: Insufficient documentation

## 2021-12-06 DIAGNOSIS — Z8041 Family history of malignant neoplasm of ovary: Secondary | ICD-10-CM | POA: Insufficient documentation

## 2021-12-06 DIAGNOSIS — Z8249 Family history of ischemic heart disease and other diseases of the circulatory system: Secondary | ICD-10-CM | POA: Diagnosis not present

## 2021-12-06 DIAGNOSIS — E876 Hypokalemia: Secondary | ICD-10-CM | POA: Diagnosis not present

## 2021-12-06 DIAGNOSIS — Z800 Family history of malignant neoplasm of digestive organs: Secondary | ICD-10-CM | POA: Diagnosis not present

## 2021-12-06 DIAGNOSIS — R5383 Other fatigue: Secondary | ICD-10-CM | POA: Diagnosis not present

## 2021-12-06 DIAGNOSIS — D649 Anemia, unspecified: Secondary | ICD-10-CM | POA: Diagnosis not present

## 2021-12-06 DIAGNOSIS — Z8719 Personal history of other diseases of the digestive system: Secondary | ICD-10-CM | POA: Insufficient documentation

## 2021-12-06 DIAGNOSIS — Z833 Family history of diabetes mellitus: Secondary | ICD-10-CM | POA: Diagnosis not present

## 2021-12-06 DIAGNOSIS — Z808 Family history of malignant neoplasm of other organs or systems: Secondary | ICD-10-CM | POA: Insufficient documentation

## 2021-12-06 DIAGNOSIS — C7951 Secondary malignant neoplasm of bone: Secondary | ICD-10-CM | POA: Diagnosis not present

## 2021-12-06 DIAGNOSIS — R634 Abnormal weight loss: Secondary | ICD-10-CM | POA: Diagnosis not present

## 2021-12-06 DIAGNOSIS — C787 Secondary malignant neoplasm of liver and intrahepatic bile duct: Secondary | ICD-10-CM | POA: Insufficient documentation

## 2021-12-06 DIAGNOSIS — Z5189 Encounter for other specified aftercare: Secondary | ICD-10-CM | POA: Diagnosis not present

## 2021-12-06 DIAGNOSIS — K219 Gastro-esophageal reflux disease without esophagitis: Secondary | ICD-10-CM | POA: Insufficient documentation

## 2021-12-06 MED ORDER — HEPARIN SOD (PORK) LOCK FLUSH 100 UNIT/ML IV SOLN
500.0000 [IU] | Freq: Once | INTRAVENOUS | Status: AC | PRN
  Administered 2021-12-06: 500 [IU]
  Filled 2021-12-06: qty 5

## 2021-12-06 MED ORDER — PEGFILGRASTIM-CBQV 6 MG/0.6ML ~~LOC~~ SOSY
6.0000 mg | PREFILLED_SYRINGE | Freq: Once | SUBCUTANEOUS | Status: AC
  Administered 2021-12-06: 6 mg via SUBCUTANEOUS

## 2021-12-06 MED ORDER — SODIUM CHLORIDE 0.9% FLUSH
10.0000 mL | INTRAVENOUS | Status: DC | PRN
  Administered 2021-12-06: 10 mL
  Filled 2021-12-06: qty 10

## 2021-12-07 ENCOUNTER — Inpatient Hospital Stay: Payer: Medicare Other

## 2021-12-18 ENCOUNTER — Inpatient Hospital Stay (HOSPITAL_BASED_OUTPATIENT_CLINIC_OR_DEPARTMENT_OTHER): Payer: Medicare Other | Admitting: Oncology

## 2021-12-18 ENCOUNTER — Encounter: Payer: Self-pay | Admitting: Oncology

## 2021-12-18 ENCOUNTER — Inpatient Hospital Stay: Payer: Medicare Other

## 2021-12-18 VITALS — BP 126/66 | HR 63 | Temp 97.7°F | Resp 16 | Ht 71.0 in | Wt 166.0 lb

## 2021-12-18 DIAGNOSIS — Z7983 Long term (current) use of bisphosphonates: Secondary | ICD-10-CM

## 2021-12-18 DIAGNOSIS — Z5111 Encounter for antineoplastic chemotherapy: Secondary | ICD-10-CM | POA: Diagnosis not present

## 2021-12-18 DIAGNOSIS — G893 Neoplasm related pain (acute) (chronic): Secondary | ICD-10-CM | POA: Diagnosis not present

## 2021-12-18 DIAGNOSIS — C159 Malignant neoplasm of esophagus, unspecified: Secondary | ICD-10-CM

## 2021-12-18 DIAGNOSIS — Z5112 Encounter for antineoplastic immunotherapy: Secondary | ICD-10-CM | POA: Diagnosis not present

## 2021-12-18 DIAGNOSIS — Z5181 Encounter for therapeutic drug level monitoring: Secondary | ICD-10-CM | POA: Diagnosis not present

## 2021-12-18 LAB — CBC WITH DIFFERENTIAL/PLATELET
Abs Immature Granulocytes: 0.11 10*3/uL — ABNORMAL HIGH (ref 0.00–0.07)
Basophils Absolute: 0 10*3/uL (ref 0.0–0.1)
Basophils Relative: 1 %
Eosinophils Absolute: 0 10*3/uL (ref 0.0–0.5)
Eosinophils Relative: 1 %
HCT: 34.7 % — ABNORMAL LOW (ref 39.0–52.0)
Hemoglobin: 10.8 g/dL — ABNORMAL LOW (ref 13.0–17.0)
Immature Granulocytes: 2 %
Lymphocytes Relative: 17 %
Lymphs Abs: 0.8 10*3/uL (ref 0.7–4.0)
MCH: 29 pg (ref 26.0–34.0)
MCHC: 31.1 g/dL (ref 30.0–36.0)
MCV: 93 fL (ref 80.0–100.0)
Monocytes Absolute: 0.5 10*3/uL (ref 0.1–1.0)
Monocytes Relative: 11 %
Neutro Abs: 3.3 10*3/uL (ref 1.7–7.7)
Neutrophils Relative %: 68 %
Platelets: 172 10*3/uL (ref 150–400)
RBC: 3.73 MIL/uL — ABNORMAL LOW (ref 4.22–5.81)
RDW: 16.1 % — ABNORMAL HIGH (ref 11.5–15.5)
WBC: 4.8 10*3/uL (ref 4.0–10.5)
nRBC: 0 % (ref 0.0–0.2)

## 2021-12-18 LAB — COMPREHENSIVE METABOLIC PANEL
ALT: 23 U/L (ref 0–44)
AST: 33 U/L (ref 15–41)
Albumin: 3.7 g/dL (ref 3.5–5.0)
Alkaline Phosphatase: 218 U/L — ABNORMAL HIGH (ref 38–126)
Anion gap: 4 — ABNORMAL LOW (ref 5–15)
BUN: 9 mg/dL (ref 8–23)
CO2: 23 mmol/L (ref 22–32)
Calcium: 8.9 mg/dL (ref 8.9–10.3)
Chloride: 112 mmol/L — ABNORMAL HIGH (ref 98–111)
Creatinine, Ser: 0.77 mg/dL (ref 0.61–1.24)
GFR, Estimated: >60 mL/min (ref >60)
Glucose, Bld: 120 mg/dL — ABNORMAL HIGH (ref 70–99)
Potassium: 3.5 mmol/L (ref 3.5–5.1)
Sodium: 139 mmol/L (ref 135–145)
Total Bilirubin: 1.3 mg/dL — ABNORMAL HIGH (ref 0.3–1.2)
Total Protein: 6.2 g/dL — ABNORMAL LOW (ref 6.5–8.1)

## 2021-12-18 MED ORDER — PALONOSETRON HCL INJECTION 0.25 MG/5ML
0.2500 mg | Freq: Once | INTRAVENOUS | Status: AC
  Administered 2021-12-18: 0.25 mg via INTRAVENOUS
  Filled 2021-12-18: qty 5

## 2021-12-18 MED ORDER — SODIUM CHLORIDE 0.9 % IV SOLN
750.0000 mg | Freq: Once | INTRAVENOUS | Status: AC
  Administered 2021-12-18: 750 mg via INTRAVENOUS
  Filled 2021-12-18: qty 37.5

## 2021-12-18 MED ORDER — ZOLEDRONIC ACID 4 MG/100ML IV SOLN
4.0000 mg | INTRAVENOUS | Status: DC
  Administered 2021-12-18: 4 mg via INTRAVENOUS
  Filled 2021-12-18: qty 100

## 2021-12-18 MED ORDER — SODIUM CHLORIDE 0.9 % IV SOLN: 2400.0000 mg/m2 | INTRAVENOUS | Status: DC

## 2021-12-18 MED ORDER — ATROPINE SULFATE 1 MG/ML IV SOLN
0.5000 mg | Freq: Once | INTRAVENOUS | Status: AC | PRN
  Administered 2021-12-18: 0.5 mg via INTRAVENOUS
  Filled 2021-12-18: qty 1

## 2021-12-18 MED ORDER — FLUOROURACIL CHEMO INJECTION 2.5 GM/50ML: 400.0000 mg/m2 | Freq: Once | INTRAVENOUS | Status: DC

## 2021-12-18 MED ORDER — HEPARIN SOD (PORK) LOCK FLUSH 100 UNIT/ML IV SOLN
500.0000 [IU] | Freq: Once | INTRAVENOUS | Status: DC | PRN
  Filled 2021-12-18: qty 5

## 2021-12-18 MED ORDER — SODIUM CHLORIDE 0.9 % IV SOLN
10.0000 mg | Freq: Once | INTRAVENOUS | Status: AC
  Administered 2021-12-18: 10 mg via INTRAVENOUS
  Filled 2021-12-18: qty 10

## 2021-12-18 MED ORDER — SODIUM CHLORIDE 0.9 % IV SOLN
4600.0000 mg | INTRAVENOUS | Status: DC
  Administered 2021-12-18: 4600 mg via INTRAVENOUS
  Filled 2021-12-18: qty 92

## 2021-12-18 MED ORDER — FLUOROURACIL CHEMO INJECTION 2.5 GM/50ML
750.0000 mg | Freq: Once | INTRAVENOUS | Status: AC
  Administered 2021-12-18: 750 mg via INTRAVENOUS
  Filled 2021-12-18: qty 15

## 2021-12-18 MED ORDER — SODIUM CHLORIDE 0.9 % IV SOLN: 180.0000 mg/m2 | Freq: Once | INTRAVENOUS | Status: DC

## 2021-12-18 MED ORDER — SODIUM CHLORIDE 0.9 % IV SOLN
Freq: Once | INTRAVENOUS | Status: AC
  Filled 2021-12-18: qty 250

## 2021-12-18 MED ORDER — SODIUM CHLORIDE 0.9 % IV SOLN
350.0000 mg | Freq: Once | INTRAVENOUS | Status: AC
  Administered 2021-12-18: 360 mg via INTRAVENOUS
  Filled 2021-12-18: qty 8

## 2021-12-18 NOTE — Progress Notes (Signed)
Per MD ok to adjust doses for current weight

## 2021-12-18 NOTE — Progress Notes (Signed)
Hematology/Oncology Consult note [facility]  [phone] [phone]  Patient Care Team: Venia Carbon, MD as PCP - General [name], RN as Oncology Nurse Navigator [name], MD as Consulting Physician (Hematology and Oncology)   Name of the patient: Cory [name]  [id number]  [birth year]   Date of visit: 12/18/21  Diagnosis- metastatic esophageal cancer with liver metastases    Chief complaint/ Reason for visit-on treatment assessment prior to cycle 9 of palliative FOLFIRI chemotherapy  Heme/Onc history: Patient is a 66 year old male with history of metastatic esophageal adenocarcinoma s/p progression on first-line FOLFOX trastuzumab Keytruda chemotherapy.NGS testing showed high tumor mutational burden CPS score less than 1. CD9-NRG1 fusion. BRAF 581L, CCN E1 gain.  EMLA for fusion of BX [id number], FGF 23 gain, FGF 6 gain, MET R988C, T p53 S1 60F. ERBB2 gain but no other actionable mutations repeat NGS testing at progression showed loss of HER2 expression.  He was therefore switched to FOLFIRI chemotherapy.   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  Case discussed with neurosurgery and this spot was thought to be difficult to biopsy.    MRI brain in December 2022 showed significant enlargement of this mass to 4 cm with associated edema in the bilateral occipital lobes and marginal hemorrhage along the left aspect of the mass.  Increased subdural hematoma along the left cerebral convexity.  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  Patient received [place] radiation since single fraction to this mass.  Patient also has an intra choroidal met for which he will be seeing retina specialist at [facility]   Also noted to have fracture of the right upper  extremity possibly pathologic and underwent palliative radiation for the same.   Disease progression in February 2023 and patient was switched to FOLFIRI chemotherapy    Interval history-overall he has remained stable in the last 2 weeks.  Pain is currently well controlled with as needed oxycodone.  Appetite is fairTo poor.  He has lost another pound and is down to 166 pounds today as compared to 169 pounds a month ago.  ECOG PS- 1 Pain scale- 0   Review of systems- Review of Systems  Constitutional:  Positive for malaise/fatigue and weight loss. Negative for chills and fever.  HENT:  Negative for congestion, ear discharge and nosebleeds.   Eyes:  Negative for blurred vision.  Respiratory:  Negative for cough, hemoptysis, sputum production, shortness of breath and wheezing.   Cardiovascular:  Negative for chest pain, palpitations, orthopnea and claudication.  Gastrointestinal:  Negative for abdominal pain, blood in stool, constipation, diarrhea, heartburn, melena, nausea and vomiting.  Genitourinary:  Negative for dysuria, flank pain, frequency, hematuria and urgency.  Musculoskeletal:  Negative for back pain, joint pain and myalgias.  Skin:  Negative for rash.  Neurological:  Negative for dizziness, tingling, focal weakness, seizures, weakness and headaches.  Endo/Heme/Allergies:  Does not bruise/bleed easily.  Psychiatric/Behavioral:  Negative for depression and suicidal ideas. The patient does not have insomnia.        No Known Allergies   Past Medical History:  Diagnosis Date   Esophageal adenocarcinoma ([place]) 06/23/2020   Family history of brain cancer    Family history of colon cancer    Family history of melanoma    Family history of stomach  cancer    GERD (gastroesophageal reflux disease)    History of kidney stones    Nephrolithiasis    [facility]   Pancreatitis, acute    Personal history of colonic polyps    Dr Nicolasa Ducking   Psoriasis      Past Surgical History:   Procedure Laterality Date   COLONOSCOPY     COLONOSCOPY WITH PROPOFOL N/A 03/31/2020   Procedure: COLONOSCOPY WITH PROPOFOL;  Surgeon: Jonathon Bellows, MD;  Location: [facility] ENDOSCOPY;  Service: Gastroenterology;  Laterality: N/A;   ERCP     ESOPHAGOGASTRODUODENOSCOPY (EGD) WITH PROPOFOL N/A 06/19/2020   Procedure: ESOPHAGOGASTRODUODENOSCOPY (EGD) WITH PROPOFOL;  Surgeon: Jonathon Bellows, MD;  Location: [facility] ENDOSCOPY;  Service: Gastroenterology;  Laterality: N/A;   groin surgery     as a child   PORTA CATH INSERTION N/A 06/28/2020   Procedure: PORTA CATH INSERTION;  Surgeon: Algernon Huxley, MD;  Location: [facility] LAB;  Service: Cardiovascular;  Laterality: N/A;   SHOULDER SURGERY     UPPER EXTREMITY VENOGRAPHY Right 07/24/2021   Procedure: UPPER EXTREMITY VENOGRAPHY;  Surgeon: Katha Cabal, MD;  Location: [facility];  Service: Cardiovascular;  Laterality: Right;    Social History   Socioeconomic History   Marital status: Married    Spouse name: Not on file   Number of children: 2   Years of education: Not on file   Highest education level: Not on file  Occupational History   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  Tobacco Use   Smoking status: Former    Types: Cigarettes    Quit date: 07/08/1988    Years since quitting: 33.4   Smokeless tobacco: Never  Vaping Use   Vaping Use: Never used  Substance and Sexual Activity   Alcohol use: No    Comment: recovering alcoholic for 13 years   Drug use: No   Sexual activity: Not Currently  Other Topics Concern   Not on file  Social History Narrative   Has living will--needs to get notarized   Daughter should be health care POA (wife with dementia)   Would accept resuscitation   Would accept tube feeds depending on the circumstance   Social Determinants of Health   Financial Resource Strain: Not on file  Food Insecurity: Not on file  Transportation Needs: Not on file  Physical Activity: Not  on file  Stress: Not on file  Social Connections: Not on file  Intimate Partner Violence: Not on file    Family History  Problem Relation Age of Onset   Stomach cancer Mother    Diabetes Brother    Hypertension Brother    Hypertension Father    Prostate cancer Father    Colon cancer Sister    Cancer Other        either cervical or ovarian, d. 75s   Brain cancer Sister      Current Outpatient Medications:    dexamethasone (DECADRON) 4 MG tablet, Take 2 tablets (8 mg total) by mouth daily. Start the day after chemotherapy for 2 days. Take with food., Disp: 30 tablet, Rfl: 1   fentaNYL (DURAGESIC) 25 MCG/HR, Place 1 patch onto the skin every 3 (three) days., Disp: 10 patch, Rfl: 0   lidocaine-prilocaine (EMLA) cream, Apply 1 application. topically as needed. Apply small amount to port site at least 1 hour prior to it being accessed, cover with plastic wrap, Disp: 30 g, Rfl: 3   loperamide (IMODIUM) 2 MG  capsule, Take 1 capsule (2 mg total) by mouth as needed. Take 2 at diarrhea onset , then 1 every 2hr until 12hrs with no BM. May take 2 every 4hrs at night. If diarrhea recurs repeat. (Patient not taking: Reported on 11/20/2021), Disp: 30 capsule, Rfl: 3   Oxycodone HCl 10 MG TABS, Take 1 tablet (10 mg total) by mouth every 4 (four) hours as needed., Disp: 120 tablet, Rfl: 0   pantoprazole (PROTONIX) 40 MG tablet, TAKE 1 TABLET BY MOUTH TWICE A DAY BEFORE MEALS, Disp: 60 tablet, Rfl: 11   prochlorperazine (COMPAZINE) 10 MG tablet, Take 1 tablet (10 mg total) by mouth every 6 (six) hours as needed for nausea or vomiting. (Patient not taking: Reported on 11/20/2021), Disp: 30 tablet, Rfl: 0   silver sulfADIAZINE (SILVADENE) 1 % cream, Apply 1 application. topically 2 (two) times daily., Disp: 50 g, Rfl: 3  Physical exam:  Vitals:   12/18/21 0843 12/18/21 0859  BP: 126/66 126/66  Pulse: 63 63  Resp: 16 16  Temp: 97.7 F (36.5 C) 97.7 F (36.5 C)  TempSrc: Oral Oral  Weight: 166 lb  (75.3 kg) 166 lb (75.3 kg)  Height: _0  (1.803 m) _1  (1.803 m)   Physical Exam Constitutional:      General: He is not in acute distress. Cardiovascular:     Rate and Rhythm: Normal rate and regular rhythm.     Heart sounds: Normal heart sounds.  Pulmonary:     Effort: Pulmonary effort is normal.     Breath sounds: Normal breath sounds.  Skin:    General: Skin is warm and dry.  Neurological:     Mental Status: He is alert and oriented to person, place, and time.         Latest Ref Rng & Units 12/18/2021    8:10 AM  CMP  Glucose 70 - 99 mg/dL 120   BUN 8 - 23 mg/dL 9   Creatinine 0.61 - 1.24 mg/dL 0.77   Sodium 135 - 145 mmol/L 139   Potassium 3.5 - 5.1 mmol/L 3.5   Chloride 98 - 111 mmol/L 112   CO2 22 - 32 mmol/L 23   Calcium 8.9 - 10.3 mg/dL 8.9   Total Protein 6.5 - 8.1 g/dL 6.2   Total Bilirubin 0.3 - 1.2 mg/dL 1.3   Alkaline Phos 38 - 126 U/L 218   AST 15 - 41 U/L 33   ALT 0 - 44 U/L 23       Latest Ref Rng & Units 12/18/2021    8:10 AM  CBC  WBC 4.0 - 10.5 K/uL 4.8   Hemoglobin 13.0 - 17.0 g/dL 10.8   Hematocrit 39.0 - 52.0 % 34.7   Platelets 150 - 400 K/uL 172     Assessment and plan- Patient is a 66 y.o. male with metastatic esophageal cancer here for on treatment assessment prior to cycle 9 of palliative FOLFIRI chemotherapy  Counts okay to proceed with cycle 9 of FOLFIRI chemotherapy today with pump disconnect on day 3.  He will receive Udenyca on that day.  Overall his CBC is remained stable with mild to moderate anemia with a hemoglobin that fluctuates between 10.5-11.5.  Platelet counts remain stable.  His CEA done 2 weeks ago was elevated at 135 as compared to 105 prior to that which is concerning.  I will plan to get a repeat CT chest abdomen pelvis with contrast in about 3 weeks time.  I will see  him back in 2 weeks for cycle 10.  Patient will also receive Zometa today for his bone metastases.   Visit Diagnosis 1. Esophageal adenocarcinoma  ([place])   2. Encounter for monoclonal antibody treatment for malignancy   3. Neoplasm related pain   4. Encounter for monitoring bisphosphonate therapy      Dr. Randa Evens, MD, MPH [facility] [phone] 12/18/2021 6:38 PM

## 2021-12-18 NOTE — Patient Instructions (Signed)
[facility]  Discharge Instructions: Thank you for choosing [facility] to provide your oncology and hematology care.   If you have a lab appointment with the Cancer Center, please go directly to the Cancer Center and check in at the registration area.   Wear comfortable clothing and clothing appropriate for easy access to any Portacath or PICC line.   We strive to give you quality time with your provider. You may need to reschedule your appointment if you arrive late (15 or more minutes).  Arriving late affects you and other patients whose appointments are after yours.  Also, if you miss three or more appointments without notifying the office, you may be dismissed from the clinic at the provider's discretion.      For prescription refill requests, have your pharmacy contact our office and allow 72 hours for refills to be completed.    Today you received the following chemotherapy and/or immunotherapy agents       To help prevent nausea and vomiting after your treatment, we encourage you to take your nausea medication as directed.  BELOW ARE SYMPTOMS THAT SHOULD BE REPORTED IMMEDIATELY: *FEVER GREATER THAN 100.4 F (38 C) OR HIGHER *CHILLS OR SWEATING *NAUSEA AND VOMITING THAT IS NOT CONTROLLED WITH YOUR NAUSEA MEDICATION *UNUSUAL SHORTNESS OF BREATH *UNUSUAL BRUISING OR BLEEDING *URINARY PROBLEMS (pain or burning when urinating, or frequent urination) *BOWEL PROBLEMS (unusual diarrhea, constipation, pain near the anus) TENDERNESS IN MOUTH AND THROAT WITH OR WITHOUT PRESENCE OF ULCERS (sore throat, sores in mouth, or a toothache) UNUSUAL RASH, SWELLING OR PAIN  UNUSUAL VAGINAL DISCHARGE OR ITCHING   Items with * indicate a potential emergency and should be followed up as soon as possible or go to the Emergency Department if any problems should occur.  Please show the CHEMOTHERAPY ALERT CARD or IMMUNOTHERAPY ALERT CARD at check-in to the  Emergency Department and triage nurse.  Should you have questions after your visit or need to cancel or reschedule your appointment, please contact [facility]  Dept: [phone]  and follow the prompts.  Office hours are 8:00 a.m. to 4:30 p.m. Monday - Friday. Please note that voicemails left after 4:00 p.m. may not be returned until the following business day.  We are closed weekends and major holidays. You have access to a nurse at all times for urgent questions. Please call the main number to the clinic Dept: [phone] and follow the prompts.   For any non-urgent questions, you may also contact your provider using MyChart. We now offer e-Visits for anyone 18 and older to request care online for non-urgent symptoms. For details visit [email or web address].   Also download the MyChart app! Go to the app store, search "MyChart", open the app, select [facility], and log in with your MyChart username and password.  Masks are optional in the cancer centers. If you would like for your care team to wear a mask while they are taking care of you, please let them know. For doctor visits, patients may have with them one support person who is at least 67 years old. At this time, visitors are not allowed in the infusion area. 

## 2021-12-18 NOTE — Progress Notes (Signed)
Pt feels fatigued and sometimes does not feel like eating. He still eats and he drinks 3 boost a day. He said that couple days while he he is walking and doing things he feels like he gets dizzy

## 2021-12-18 NOTE — Progress Notes (Addendum)
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  Patient with disease progression in Feb 2023 and treatment changed to folfiri.  Met with patient during infusion.  Patient reports that his appetite is not that good.  "I try to eat but sometimes I can only eat a few bites."  I try to force myself to eat."  Drinking 1 boost VHC shake each day and 2 boost plus shakes.  Drinks them with meals. Ate beef tips and rice for dinner last night.  Has ice cream about every other night for bedtime snack.  "I want to get my weight back."  Has added pimento cheese sandwich at lunch    Medications: reviewed  Labs: reviewed  Anthropometrics:   Weight 166 lb  171 lb on 5/2 198 lb on 06/19/21   NUTRITION DIAGNOSIS: Inadequate oral intake continues   INTERVENTION:  Recommend drinking 2 boost VHC and 1 boost plus daily Eat ice cream snack every night for bedtime snack Discussed briefly appetite stimulant maybe an option.  Patient wants to hold off at this time and consider if continues to loose weight.  Message sent to MD.       MONITORING, EVALUATION, GOAL: weight trends, intake   NEXT VISIT: Tuesday, June 27 during infusion  Jadelyn Elks B. Zenia Resides, [place], [place] Registered Dietitian [phone]

## 2021-12-19 LAB — CEA: CEA: 189 ng/mL — ABNORMAL HIGH (ref 0.0–4.7)

## 2021-12-20 ENCOUNTER — Inpatient Hospital Stay: Payer: Medicare Other

## 2021-12-20 VITALS — BP 148/83 | HR 71 | Temp 96.8°F | Resp 18

## 2021-12-20 DIAGNOSIS — C159 Malignant neoplasm of esophagus, unspecified: Secondary | ICD-10-CM

## 2021-12-20 DIAGNOSIS — Z5111 Encounter for antineoplastic chemotherapy: Secondary | ICD-10-CM | POA: Diagnosis not present

## 2021-12-20 MED ORDER — SODIUM CHLORIDE 0.9% FLUSH
10.0000 mL | INTRAVENOUS | Status: DC | PRN
  Administered 2021-12-20: 10 mL
  Filled 2021-12-20: qty 10

## 2021-12-20 MED ORDER — HEPARIN SOD (PORK) LOCK FLUSH 100 UNIT/ML IV SOLN
500.0000 [IU] | Freq: Once | INTRAVENOUS | Status: AC | PRN
  Administered 2021-12-20: 500 [IU]
  Filled 2021-12-20: qty 5

## 2021-12-20 MED ORDER — PEGFILGRASTIM-CBQV 6 MG/0.6ML ~~LOC~~ SOSY
6.0000 mg | PREFILLED_SYRINGE | Freq: Once | SUBCUTANEOUS | Status: AC
  Administered 2021-12-20: 6 mg via SUBCUTANEOUS

## 2022-01-01 ENCOUNTER — Inpatient Hospital Stay (HOSPITAL_BASED_OUTPATIENT_CLINIC_OR_DEPARTMENT_OTHER): Payer: Medicare Other | Admitting: Oncology

## 2022-01-01 ENCOUNTER — Inpatient Hospital Stay: Payer: Medicare Other

## 2022-01-01 ENCOUNTER — Encounter: Payer: Self-pay | Admitting: Oncology

## 2022-01-01 VITALS — BP 135/73 | HR 65 | Resp 16 | Wt 161.6 lb

## 2022-01-01 DIAGNOSIS — C159 Malignant neoplasm of esophagus, unspecified: Secondary | ICD-10-CM

## 2022-01-01 DIAGNOSIS — Z5111 Encounter for antineoplastic chemotherapy: Secondary | ICD-10-CM | POA: Diagnosis not present

## 2022-01-01 DIAGNOSIS — Z7189 Other specified counseling: Secondary | ICD-10-CM | POA: Diagnosis not present

## 2022-01-01 DIAGNOSIS — E876 Hypokalemia: Secondary | ICD-10-CM

## 2022-01-01 DIAGNOSIS — G893 Neoplasm related pain (acute) (chronic): Secondary | ICD-10-CM

## 2022-01-01 LAB — CBC WITH DIFFERENTIAL/PLATELET
Abs Immature Granulocytes: 0.1 10*3/uL — ABNORMAL HIGH (ref 0.00–0.07)
Basophils Absolute: 0 10*3/uL (ref 0.0–0.1)
Basophils Relative: 1 %
Eosinophils Absolute: 0 10*3/uL (ref 0.0–0.5)
Eosinophils Relative: 0 %
HCT: 34 % — ABNORMAL LOW (ref 39.0–52.0)
Hemoglobin: 10.6 g/dL — ABNORMAL LOW (ref 13.0–17.0)
Immature Granulocytes: 2 %
Lymphocytes Relative: 9 %
Lymphs Abs: 0.5 10*3/uL — ABNORMAL LOW (ref 0.7–4.0)
MCH: 28.7 pg (ref 26.0–34.0)
MCHC: 31.2 g/dL (ref 30.0–36.0)
MCV: 92.1 fL (ref 80.0–100.0)
Monocytes Absolute: 0.6 10*3/uL (ref 0.1–1.0)
Monocytes Relative: 10 %
Neutro Abs: 4.4 10*3/uL (ref 1.7–7.7)
Neutrophils Relative %: 78 %
Platelets: 208 10*3/uL (ref 150–400)
RBC: 3.69 MIL/uL — ABNORMAL LOW (ref 4.22–5.81)
RDW: 16.4 % — ABNORMAL HIGH (ref 11.5–15.5)
WBC: 5.7 10*3/uL (ref 4.0–10.5)
nRBC: 0 % (ref 0.0–0.2)

## 2022-01-01 LAB — COMPREHENSIVE METABOLIC PANEL
ALT: 17 U/L (ref 0–44)
AST: 30 U/L (ref 15–41)
Albumin: 3.6 g/dL (ref 3.5–5.0)
Alkaline Phosphatase: 176 U/L — ABNORMAL HIGH (ref 38–126)
Anion gap: 7 (ref 5–15)
BUN: 11 mg/dL (ref 8–23)
CO2: 22 mmol/L (ref 22–32)
Calcium: 8.3 mg/dL — ABNORMAL LOW (ref 8.9–10.3)
Chloride: 108 mmol/L (ref 98–111)
Creatinine, Ser: 0.81 mg/dL (ref 0.61–1.24)
GFR, Estimated: >60 mL/min (ref >60)
Glucose, Bld: 125 mg/dL — ABNORMAL HIGH (ref 70–99)
Potassium: 3.1 mmol/L — ABNORMAL LOW (ref 3.5–5.1)
Sodium: 137 mmol/L (ref 135–145)
Total Bilirubin: 1.3 mg/dL — ABNORMAL HIGH (ref 0.3–1.2)
Total Protein: 6.1 g/dL — ABNORMAL LOW (ref 6.5–8.1)

## 2022-01-01 MED ORDER — SODIUM CHLORIDE 0.9 % IV SOLN: 180.0000 mg/m2 | Freq: Once | INTRAVENOUS | Status: DC

## 2022-01-01 MED ORDER — SODIUM CHLORIDE 0.9 % IV SOLN
2400.0000 mg/m2 | INTRAVENOUS | Status: DC
  Administered 2022-01-01: 4600 mg via INTRAVENOUS
  Filled 2022-01-01: qty 92

## 2022-01-01 MED ORDER — SODIUM CHLORIDE 0.9 % IV SOLN: 2400.0000 mg/m2 | INTRAVENOUS | Status: DC

## 2022-01-01 MED ORDER — POTASSIUM CHLORIDE IN NACL 20-0.9 MEQ/L-% IV SOLN
Freq: Once | INTRAVENOUS | Status: AC
  Filled 2022-01-01: qty 1000

## 2022-01-01 MED ORDER — ATROPINE SULFATE 1 MG/ML IV SOLN
0.5000 mg | Freq: Once | INTRAVENOUS | Status: AC | PRN
  Administered 2022-01-01: 0.5 mg via INTRAVENOUS
  Filled 2022-01-01: qty 1

## 2022-01-01 MED ORDER — SODIUM CHLORIDE 0.9 % IV SOLN: 400.0000 mg/m2 | Freq: Once | INTRAVENOUS | Status: DC

## 2022-01-01 MED ORDER — FENTANYL 25 MCG/HR TD PT72: 1.0000 | MEDICATED_PATCH | TRANSDERMAL | 0 refills | Status: DC

## 2022-01-01 MED ORDER — SODIUM CHLORIDE 0.9 % IV SOLN
180.0000 mg/m2 | Freq: Once | INTRAVENOUS | Status: AC
  Administered 2022-01-01: 340 mg via INTRAVENOUS
  Filled 2022-01-01: qty 15

## 2022-01-01 MED ORDER — POTASSIUM CHLORIDE CRYS ER 20 MEQ PO TBCR: 20.0000 meq | EXTENDED_RELEASE_TABLET | Freq: Every day | ORAL | 1 refills | Status: DC

## 2022-01-01 MED ORDER — SODIUM CHLORIDE 0.9 % IV SOLN
10.0000 mg | Freq: Once | INTRAVENOUS | Status: AC
  Administered 2022-01-01: 10 mg via INTRAVENOUS
  Filled 2022-01-01: qty 10

## 2022-01-01 MED ORDER — FLUOROURACIL CHEMO INJECTION 2.5 GM/50ML
400.0000 mg/m2 | Freq: Once | INTRAVENOUS | Status: AC
  Administered 2022-01-01: 750 mg via INTRAVENOUS
  Filled 2022-01-01: qty 15

## 2022-01-01 MED ORDER — SODIUM CHLORIDE 0.9 % IV SOLN
Freq: Once | INTRAVENOUS | Status: AC
  Filled 2022-01-01: qty 250

## 2022-01-01 MED ORDER — PALONOSETRON HCL INJECTION 0.25 MG/5ML
0.2500 mg | Freq: Once | INTRAVENOUS | Status: AC
  Administered 2022-01-01: 0.25 mg via INTRAVENOUS
  Filled 2022-01-01: qty 5

## 2022-01-01 MED ORDER — SODIUM CHLORIDE 0.9 % IV SOLN
391.0000 mg/m2 | Freq: Once | INTRAVENOUS | Status: AC
  Administered 2022-01-01: 750 mg via INTRAVENOUS
  Filled 2022-01-01: qty 37.5

## 2022-01-01 MED ORDER — FLUOROURACIL CHEMO INJECTION 2.5 GM/50ML: 400.0000 mg/m2 | Freq: Once | INTRAVENOUS | Status: DC

## 2022-01-01 NOTE — Progress Notes (Signed)
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  Patient with disease progression in Feb 2023 and treatment changed to folfiri.  Planning CT of chest abdomina and pelvis and bone scan next week.    Met with patient in infusion.  Patient reports poor appetite.  Only able to eat a few bites and then does not want anymore.  Drinks Boost VHC (5oo+ calorie) TID.    Medications: reviewed  Labs: K being added today  Anthropometrics:   Weight 161 lb 9.6 oz today  166 lb on 6/13 171 lb on 5/2 198 lb on 06/19/21   NUTRITION DIAGNOSIS: Inadequate oral intake continues   INTERVENTION:  Continue boost VHC daily (~1500 calories) Encouraged high calorie, high protein foods Consider trial of appetite stimulant. Message sent to MD   MONITORING, EVALUATION, GOAL: weight trends, intake   NEXT VISIT: ~2-4 weeks  Loni Delbridge B. Freida Busman, RD, LDN Registered Dietitian [phone]

## 2022-01-01 NOTE — Progress Notes (Signed)
Pt will like to discuss his schedule; if it is possible for him to come every 3 weeks vs 2. States every time he starts developing an appetite he has to come back and throws his appetite off.

## 2022-01-03 ENCOUNTER — Inpatient Hospital Stay: Payer: Medicare Other

## 2022-01-03 DIAGNOSIS — C159 Malignant neoplasm of esophagus, unspecified: Secondary | ICD-10-CM

## 2022-01-03 DIAGNOSIS — Z5111 Encounter for antineoplastic chemotherapy: Secondary | ICD-10-CM | POA: Diagnosis not present

## 2022-01-03 MED ORDER — SODIUM CHLORIDE 0.9% FLUSH
10.0000 mL | INTRAVENOUS | Status: DC | PRN
  Administered 2022-01-03: 10 mL
  Filled 2022-01-03: qty 10

## 2022-01-03 MED ORDER — PEGFILGRASTIM-CBQV 6 MG/0.6ML ~~LOC~~ SOSY
6.0000 mg | PREFILLED_SYRINGE | Freq: Once | SUBCUTANEOUS | Status: AC
  Administered 2022-01-03: 6 mg via SUBCUTANEOUS

## 2022-01-03 MED ORDER — HEPARIN SOD (PORK) LOCK FLUSH 100 UNIT/ML IV SOLN
500.0000 [IU] | Freq: Once | INTRAVENOUS | Status: AC | PRN
  Administered 2022-01-03: 500 [IU]
  Filled 2022-01-03: qty 5

## 2022-01-11 ENCOUNTER — Encounter
Admission: RE | Admit: 2022-01-11 | Discharge: 2022-01-11 | Disposition: A | Discharge status: Home / Self Care | Payer: Medicare Other | Source: Ambulatory Visit | Attending: Oncology | Admitting: Oncology

## 2022-01-11 ENCOUNTER — Ambulatory Visit
Admission: RE | Admit: 2022-01-11 | Discharge: 2022-01-11 | Disposition: A | Discharge status: Home / Self Care | Payer: Medicare Other | Source: Ambulatory Visit | Attending: Oncology | Admitting: Oncology

## 2022-01-11 DIAGNOSIS — C159 Malignant neoplasm of esophagus, unspecified: Secondary | ICD-10-CM | POA: Insufficient documentation

## 2022-01-11 MED ORDER — TECHNETIUM TC 99M MEDRONATE IV KIT
20.0000 | PACK | Freq: Once | INTRAVENOUS | Status: AC | PRN
  Administered 2022-01-11: 20 via INTRAVENOUS

## 2022-01-11 MED ORDER — IOHEXOL 300 MG/ML  SOLN
100.0000 mL | Freq: Once | INTRAMUSCULAR | Status: AC | PRN
  Administered 2022-01-11: 100 mL via INTRAVENOUS

## 2022-01-15 ENCOUNTER — Inpatient Hospital Stay: Payer: Medicare Other

## 2022-01-15 ENCOUNTER — Inpatient Hospital Stay: Payer: Medicare Other | Attending: Oncology

## 2022-01-15 ENCOUNTER — Telehealth: Payer: Self-pay | Admitting: Pharmacist

## 2022-01-15 ENCOUNTER — Other Ambulatory Visit: Payer: Self-pay | Admitting: *Deleted

## 2022-01-15 ENCOUNTER — Encounter: Payer: Self-pay | Admitting: Oncology

## 2022-01-15 ENCOUNTER — Other Ambulatory Visit (HOSPITAL_COMMUNITY): Payer: Self-pay

## 2022-01-15 ENCOUNTER — Telehealth: Payer: Self-pay | Admitting: Pharmacy Technician

## 2022-01-15 ENCOUNTER — Inpatient Hospital Stay (HOSPITAL_BASED_OUTPATIENT_CLINIC_OR_DEPARTMENT_OTHER): Payer: Medicare Other | Admitting: Oncology

## 2022-01-15 ENCOUNTER — Inpatient Hospital Stay: Payer: Medicare Other | Admitting: Pharmacist

## 2022-01-15 ENCOUNTER — Inpatient Hospital Stay (HOSPITAL_BASED_OUTPATIENT_CLINIC_OR_DEPARTMENT_OTHER): Payer: Medicare Other | Admitting: Hospice and Palliative Medicine

## 2022-01-15 VITALS — BP 141/71 | HR 74 | Temp 97.4°F | Resp 16 | Ht 71.0 in | Wt 161.3 lb

## 2022-01-15 DIAGNOSIS — K219 Gastro-esophageal reflux disease without esophagitis: Secondary | ICD-10-CM | POA: Diagnosis not present

## 2022-01-15 DIAGNOSIS — C7800 Secondary malignant neoplasm of unspecified lung: Secondary | ICD-10-CM | POA: Insufficient documentation

## 2022-01-15 DIAGNOSIS — R161 Splenomegaly, not elsewhere classified: Secondary | ICD-10-CM | POA: Diagnosis not present

## 2022-01-15 DIAGNOSIS — C7951 Secondary malignant neoplasm of bone: Secondary | ICD-10-CM | POA: Insufficient documentation

## 2022-01-15 DIAGNOSIS — Z7901 Long term (current) use of anticoagulants: Secondary | ICD-10-CM | POA: Insufficient documentation

## 2022-01-15 DIAGNOSIS — I517 Cardiomegaly: Secondary | ICD-10-CM | POA: Diagnosis not present

## 2022-01-15 DIAGNOSIS — R590 Localized enlarged lymph nodes: Secondary | ICD-10-CM | POA: Diagnosis not present

## 2022-01-15 DIAGNOSIS — Z5111 Encounter for antineoplastic chemotherapy: Secondary | ICD-10-CM | POA: Diagnosis present

## 2022-01-15 DIAGNOSIS — Z7189 Other specified counseling: Secondary | ICD-10-CM

## 2022-01-15 DIAGNOSIS — E876 Hypokalemia: Secondary | ICD-10-CM

## 2022-01-15 DIAGNOSIS — J948 Other specified pleural conditions: Secondary | ICD-10-CM | POA: Diagnosis not present

## 2022-01-15 DIAGNOSIS — Z79899 Other long term (current) drug therapy: Secondary | ICD-10-CM | POA: Insufficient documentation

## 2022-01-15 DIAGNOSIS — G893 Neoplasm related pain (acute) (chronic): Secondary | ICD-10-CM | POA: Diagnosis not present

## 2022-01-15 DIAGNOSIS — C159 Malignant neoplasm of esophagus, unspecified: Secondary | ICD-10-CM

## 2022-01-15 DIAGNOSIS — Z8719 Personal history of other diseases of the digestive system: Secondary | ICD-10-CM | POA: Insufficient documentation

## 2022-01-15 DIAGNOSIS — D649 Anemia, unspecified: Secondary | ICD-10-CM | POA: Diagnosis not present

## 2022-01-15 DIAGNOSIS — C787 Secondary malignant neoplasm of liver and intrahepatic bile duct: Secondary | ICD-10-CM | POA: Diagnosis not present

## 2022-01-15 DIAGNOSIS — Z9221 Personal history of antineoplastic chemotherapy: Secondary | ICD-10-CM | POA: Insufficient documentation

## 2022-01-15 DIAGNOSIS — M4854XA Collapsed vertebra, not elsewhere classified, thoracic region, initial encounter for fracture: Secondary | ICD-10-CM | POA: Diagnosis not present

## 2022-01-15 DIAGNOSIS — Z66 Do not resuscitate: Secondary | ICD-10-CM | POA: Insufficient documentation

## 2022-01-15 DIAGNOSIS — N200 Calculus of kidney: Secondary | ICD-10-CM | POA: Insufficient documentation

## 2022-01-15 DIAGNOSIS — J984 Other disorders of lung: Secondary | ICD-10-CM | POA: Diagnosis not present

## 2022-01-15 DIAGNOSIS — Z86711 Personal history of pulmonary embolism: Secondary | ICD-10-CM | POA: Insufficient documentation

## 2022-01-15 DIAGNOSIS — C155 Malignant neoplasm of lower third of esophagus: Secondary | ICD-10-CM | POA: Diagnosis present

## 2022-01-15 DIAGNOSIS — Z833 Family history of diabetes mellitus: Secondary | ICD-10-CM | POA: Insufficient documentation

## 2022-01-15 DIAGNOSIS — K7460 Unspecified cirrhosis of liver: Secondary | ICD-10-CM | POA: Diagnosis not present

## 2022-01-15 DIAGNOSIS — M47816 Spondylosis without myelopathy or radiculopathy, lumbar region: Secondary | ICD-10-CM | POA: Diagnosis not present

## 2022-01-15 DIAGNOSIS — I700 Atherosclerosis of aorta: Secondary | ICD-10-CM | POA: Diagnosis not present

## 2022-01-15 DIAGNOSIS — Z8042 Family history of malignant neoplasm of prostate: Secondary | ICD-10-CM | POA: Insufficient documentation

## 2022-01-15 DIAGNOSIS — Z808 Family history of malignant neoplasm of other organs or systems: Secondary | ICD-10-CM | POA: Insufficient documentation

## 2022-01-15 DIAGNOSIS — Z515 Encounter for palliative care: Secondary | ICD-10-CM | POA: Diagnosis not present

## 2022-01-15 DIAGNOSIS — Z800 Family history of malignant neoplasm of digestive organs: Secondary | ICD-10-CM | POA: Insufficient documentation

## 2022-01-15 DIAGNOSIS — J9811 Atelectasis: Secondary | ICD-10-CM | POA: Diagnosis not present

## 2022-01-15 DIAGNOSIS — J90 Pleural effusion, not elsewhere classified: Secondary | ICD-10-CM | POA: Insufficient documentation

## 2022-01-15 DIAGNOSIS — C7931 Secondary malignant neoplasm of brain: Secondary | ICD-10-CM | POA: Diagnosis not present

## 2022-01-15 DIAGNOSIS — Z8249 Family history of ischemic heart disease and other diseases of the circulatory system: Secondary | ICD-10-CM | POA: Insufficient documentation

## 2022-01-15 DIAGNOSIS — Z87891 Personal history of nicotine dependence: Secondary | ICD-10-CM | POA: Insufficient documentation

## 2022-01-15 DIAGNOSIS — Z95828 Presence of other vascular implants and grafts: Secondary | ICD-10-CM

## 2022-01-15 DIAGNOSIS — Z8049 Family history of malignant neoplasm of other genital organs: Secondary | ICD-10-CM | POA: Insufficient documentation

## 2022-01-15 DIAGNOSIS — M47814 Spondylosis without myelopathy or radiculopathy, thoracic region: Secondary | ICD-10-CM | POA: Diagnosis not present

## 2022-01-15 LAB — CBC WITH DIFFERENTIAL/PLATELET
Abs Immature Granulocytes: 0.13 10*3/uL — ABNORMAL HIGH (ref 0.00–0.07)
Basophils Absolute: 0 10*3/uL (ref 0.0–0.1)
Basophils Relative: 1 %
Eosinophils Absolute: 0 10*3/uL (ref 0.0–0.5)
Eosinophils Relative: 1 %
HCT: 34.2 % — ABNORMAL LOW (ref 39.0–52.0)
Hemoglobin: 10.6 g/dL — ABNORMAL LOW (ref 13.0–17.0)
Immature Granulocytes: 2 %
Lymphocytes Relative: 9 %
Lymphs Abs: 0.6 10*3/uL — ABNORMAL LOW (ref 0.7–4.0)
MCH: 28.4 pg (ref 26.0–34.0)
MCHC: 31 g/dL (ref 30.0–36.0)
MCV: 91.7 fL (ref 80.0–100.0)
Monocytes Absolute: 0.5 10*3/uL (ref 0.1–1.0)
Monocytes Relative: 7 %
Neutro Abs: 5.5 10*3/uL (ref 1.7–7.7)
Neutrophils Relative %: 80 %
Platelets: 265 10*3/uL (ref 150–400)
RBC: 3.73 MIL/uL — ABNORMAL LOW (ref 4.22–5.81)
RDW: 16.5 % — ABNORMAL HIGH (ref 11.5–15.5)
WBC: 6.7 10*3/uL (ref 4.0–10.5)
nRBC: 0 % (ref 0.0–0.2)

## 2022-01-15 LAB — COMPREHENSIVE METABOLIC PANEL
ALT: 14 U/L (ref 0–44)
AST: 30 U/L (ref 15–41)
Albumin: 3.4 g/dL — ABNORMAL LOW (ref 3.5–5.0)
Alkaline Phosphatase: 232 U/L — ABNORMAL HIGH (ref 38–126)
Anion gap: 8 (ref 5–15)
BUN: 9 mg/dL (ref 8–23)
CO2: 21 mmol/L — ABNORMAL LOW (ref 22–32)
Calcium: 8.4 mg/dL — ABNORMAL LOW (ref 8.9–10.3)
Chloride: 111 mmol/L (ref 98–111)
Creatinine, Ser: 0.78 mg/dL (ref 0.61–1.24)
GFR, Estimated: >60 mL/min (ref >60)
Glucose, Bld: 140 mg/dL — ABNORMAL HIGH (ref 70–99)
Potassium: 3 mmol/L — ABNORMAL LOW (ref 3.5–5.1)
Sodium: 140 mmol/L (ref 135–145)
Total Bilirubin: 1.2 mg/dL (ref 0.3–1.2)
Total Protein: 5.7 g/dL — ABNORMAL LOW (ref 6.5–8.1)

## 2022-01-15 MED ORDER — POTASSIUM CHLORIDE IN NACL 20-0.9 MEQ/L-% IV SOLN
INTRAVENOUS | Status: DC
  Filled 2022-01-15 (×2): qty 1000

## 2022-01-15 MED ORDER — POTASSIUM CHLORIDE CRYS ER 10 MEQ PO TBCR: 10.0000 meq | EXTENDED_RELEASE_TABLET | Freq: Two times a day (BID) | ORAL | 0 refills | Status: AC

## 2022-01-15 MED ORDER — HEPARIN SOD (PORK) LOCK FLUSH 100 UNIT/ML IV SOLN
500.0000 [IU] | Freq: Once | INTRAVENOUS | Status: AC
  Administered 2022-01-15: 500 [IU] via INTRAVENOUS
  Filled 2022-01-15: qty 5

## 2022-01-15 MED FILL — Dexamethasone Sodium Phosphate Inj 100 MG/10ML: INTRAMUSCULAR | Qty: 1 | Status: AC

## 2022-01-15 NOTE — Progress Notes (Signed)
[place] [facility] Telephone:(336) [phone] Fax:(336) [phone]   Name: Cory [name] Date: 01/15/2022 MRN: [id number]  DOB: [birth year]  Patient Care Team: Venia Carbon, MD as PCP - General [name], RN as Oncology Nurse Navigator [name], MD as Consulting Physician (Hematology and Oncology)    REASON FOR CONSULTATION: Cory [name] is a 66 y.o. male with multiple medical problems including metastatic esophageal cancer with liver metastases.  Patient completed 10 cycles of palliative FOLFIRI chemotherapy.  CT of the chest, abdomen, and pelvis on 01/11/2022 revealed interval progression of pulmonary metastases, metastatic thoracic adenopathy, and widespread osseous metastatic disease.  Palliative care was consulted to address goals.   SOCIAL HISTORY:     reports that he quit smoking about 33 years ago. His smoking use included cigarettes. He has never used smokeless tobacco. He reports that he does not drink alcohol and does not use drugs.  Patient is married lives at home with his wife.  Patient has 2 daughters who are involved.  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   Family history of brain cancer    Family history of colon cancer    Family history of melanoma    Family history of stomach cancer    GERD (gastroesophageal reflux disease)    History of kidney stones    Nephrolithiasis    [facility]   Pancreatitis, acute    Personal history of colonic polyps    Dr Nicolasa Ducking   Psoriasis     PAST SURGICAL HISTORY:  Past Surgical History:  Procedure Laterality Date   COLONOSCOPY     COLONOSCOPY WITH PROPOFOL N/A 03/31/2020   Procedure: COLONOSCOPY WITH PROPOFOL;  Surgeon: Jonathon Bellows, MD;  Location: [facility] ENDOSCOPY;  Service: Gastroenterology;  Laterality: N/A;   ERCP      ESOPHAGOGASTRODUODENOSCOPY (EGD) WITH PROPOFOL N/A 06/19/2020   Procedure: ESOPHAGOGASTRODUODENOSCOPY (EGD) WITH PROPOFOL;  Surgeon: Jonathon Bellows, MD;  Location: [facility] ENDOSCOPY;  Service: Gastroenterology;  Laterality: N/A;   groin surgery     as a child   PORTA CATH INSERTION N/A 06/28/2020   Procedure: PORTA CATH INSERTION;  Surgeon: Algernon Huxley, MD;  Location: [place] CV LAB;  Service: Cardiovascular;  Laterality: N/A;   SHOULDER SURGERY     UPPER EXTREMITY VENOGRAPHY Right 07/24/2021   Procedure: UPPER EXTREMITY VENOGRAPHY;  Surgeon: Katha Cabal, MD;  Location: [facility] CV LAB;  Service: Cardiovascular;  Laterality: Right;    HEMATOLOGY/ONCOLOGY HISTORY:  Oncology History  Esophageal adenocarcinoma ([place])  06/23/2020 Initial Diagnosis   Esophageal adenocarcinoma ([place])   06/28/2020 Cancer Staging   Staging form: Esophagus - Adenocarcinoma, AJCC 8th Edition - Clinical stage from 06/28/2020: Stage IVB (cTX, cN1, cM1) - Signed by [name], MD on 06/28/2020   07/03/2020 - 07/06/2021 Chemotherapy   Patient is on Treatment Plan : GASTROESOPHAGEAL FOLFOX/Kanjintiq14d x 6 cycles + Keytruda '400mg'$  q6w      02/13/2021 - 02/13/2021 Chemotherapy   Patient is on Treatment Plan : GE junction Trastuzumab q14d     08/06/2021 - 08/06/2021 Chemotherapy   Patient is on Treatment Plan : GASTROESOPHAGEAL fam-trastuzumab deruxtecan-nxki (Enhertu) q21d     08/30/2021 -  Chemotherapy   Patient is on Treatment Plan : GE junction       ALLERGIES:  has No Known Allergies.  MEDICATIONS:  Current Outpatient  Medications  Medication Sig Dispense Refill   dexamethasone (DECADRON) 4 MG tablet Take 2 tablets (8 mg total) by mouth daily. Start the day after chemotherapy for 2 days. Take with food. 30 tablet 1   fentaNYL (DURAGESIC) 25 MCG/HR Place 1 patch onto the skin every 3 (three) days. 10 patch 0   lidocaine-prilocaine (EMLA) cream Apply 1 application. topically as needed. Apply small  amount to port site at least 1 hour prior to it being accessed, cover with plastic wrap 30 g 3   loperamide (IMODIUM) 2 MG capsule Take 1 capsule (2 mg total) by mouth as needed. Take 2 at diarrhea onset , then 1 every 2hr until 12hrs with no BM. May take 2 every 4hrs at night. If diarrhea recurs repeat. (Patient not taking: Reported on 11/20/2021) 30 capsule 3   Oxycodone HCl 10 MG TABS Take 1 tablet (10 mg total) by mouth every 4 (four) hours as needed. 120 tablet 0   pantoprazole (PROTONIX) 40 MG tablet TAKE 1 TABLET BY MOUTH TWICE A DAY BEFORE MEALS 60 tablet 11   prochlorperazine (COMPAZINE) 10 MG tablet Take 1 tablet (10 mg total) by mouth every 6 (six) hours as needed for nausea or vomiting. (Patient not taking: Reported on 11/20/2021) 30 tablet 0   No current facility-administered medications for this visit.   Facility-Administered Medications Ordered in Other Visits  Medication Dose Route Frequency Provider Last Rate Last Admin   0.9 % NaCl with KCl 20 mEq/ L  infusion   Intravenous Continuous [name], MD        VITAL SIGNS: There were no vitals taken for this visit. There were no vitals filed for this visit.  Estimated body mass index is 22.5 kg/m as calculated from the following:   Height as of an earlier encounter on 01/15/22: '5\' 11"'$  (1.803 m).   Weight as of an earlier encounter on 01/15/22: 161 lb 4.8 oz (73.2 kg).  LABS: CBC:    Component Value Date/Time   WBC 6.7 01/15/2022 0903   HGB 10.6 (L) 01/15/2022 0903   HCT 34.2 (L) 01/15/2022 0903   PLT 265 01/15/2022 0903   MCV 91.7 01/15/2022 0903   NEUTROABS 5.5 01/15/2022 0903   LYMPHSABS 0.6 (L) 01/15/2022 0903   MONOABS 0.5 01/15/2022 0903   EOSABS 0.0 01/15/2022 0903   BASOSABS 0.0 01/15/2022 0903   Comprehensive Metabolic Panel:    Component Value Date/Time   NA 140 01/15/2022 0903   K 3.0 (L) 01/15/2022 0903   CL 111 01/15/2022 0903   CO2 21 (L) 01/15/2022 0903   BUN 9 01/15/2022 0903   CREATININE 0.78  01/15/2022 0903   GLUCOSE 140 (H) 01/15/2022 0903   CALCIUM 8.4 (L) 01/15/2022 0903   AST 30 01/15/2022 0903   ALT 14 01/15/2022 0903   ALKPHOS 232 (H) 01/15/2022 0903   BILITOT 1.2 01/15/2022 0903   PROT 5.7 (L) 01/15/2022 0903   ALBUMIN 3.4 (L) 01/15/2022 0903    RADIOGRAPHIC STUDIES: NM Bone Scan Whole Body  Result Date: 01/12/2022 CLINICAL DATA:  Metastatic esophageal cancer EXAM: NUCLEAR MEDICINE WHOLE BODY BONE SCAN TECHNIQUE: Whole body anterior and posterior images were obtained approximately 3 hours after intravenous injection of radiopharmaceutical. RADIOPHARMACEUTICALS:  23.44 mCi Technetium-102mMDP IV COMPARISON:  CT chest abdomen pelvis 01/11/2022, bone scan 10/31/2021, PET CT 05/22/2021 FINDINGS: Widespread skeletal metastatic disease involving the sternum, calvarium, right humerus, bilateral ribs, pelvic bones, spine and left femur. Small focus of activity at the proximal left  tibia without change. 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 The overall distribution of activity is grossly similar as compared with the prior bone scan though there may be slightly more intense sternal activity. No definitive new focus of activity is visualized. Physiologic renal and bladder activity. IMPRESSION: Findings consistent with widespread skeletal metastatic disease with overall grossly stable distribution of activity since prior bone scan with exception of slightly more intense activity involving the sternum. Electronically Signed   By: Donavan Foil M.D.   On: 01/12/2022 21:54   CT CHEST ABDOMEN PELVIS W CONTRAST  Result Date: 01/11/2022 CLINICAL DATA:  Metastatic esophageal cancer on chemotherapy. Restaging. * Tracking Code: BO * EXAM: CT CHEST, ABDOMEN, AND PELVIS WITH CONTRAST TECHNIQUE: Multidetector CT imaging of the chest, abdomen and pelvis was performed following the standard protocol during bolus administration  of intravenous contrast. RADIATION DOSE REDUCTION: This exam was performed according to the departmental dose-optimization program which includes automated exposure control, adjustment of the mA and/or kV according to patient size and/or use of iterative reconstruction technique. CONTRAST:  148m OMNIPAQUE IOHEXOL 300 MG/ML  SOLN COMPARISON:  10/31/2021 CT chest, abdomen and pelvis. FINDINGS: CT CHEST FINDINGS Cardiovascular: Normal heart size. No significant pericardial effusion/thickening. Left anterior descending and right coronary atherosclerosis. Right internal jugular Port-A-Cath terminates in the lower third of the SVC. Atherosclerotic nonaneurysmal thoracic aorta. Normal caliber pulmonary arteries. No central pulmonary emboli. Mediastinum/Nodes: No discrete thyroid nodules. Small amount of oral contrast throughout the thoracic esophageal lumen. No discrete thoracic esophageal mass. No axillary adenopathy. Mildly enlarged 1.4 cm short axis diameter right paratracheal node (series 2/image 25), increased from 0.8 cm. Newly enlarged 1.4 cm left hilar node (series 2/image 29). No pathologically enlarged right hilar nodes. Lungs/Pleura: No pneumothorax. Moderate dependent right pleural effusion is essentially new. New trace dependent left pleural effusion. Innumerable (> 20) solid pulmonary nodules scattered throughout both lungs, increased. Representative medial right middle lobe 2.7 x 1.6 cm nodule (series 4/image 90), increased from 2.1 x 1.3 cm. Representative medial left lower lobe 0.9 cm nodule (series 4/image 125), increased from 0.7 cm. Representative 0.5 cm posterior right lower lobe pulmonary nodule (series 4/image 124), not definitely seen on prior CT. Musculoskeletal: Innumerable subtle permeative osseous lesions throughout the thoracic skeleton including sternum, manubrium, thoracic spine, ribs and right humerus, mildly increased. Representative 2.6 cm manubrial lesion (series 6/image 131), increased  from 2.3 cm. Representative 1.8 cm T8 vertebral lesion (series 6/image 123), increased from 1.5 cm. 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 Moderate thoracic spondylosis. CT ABDOMEN PELVIS FINDINGS Hepatobiliary: Diffusely irregular liver surface compatible with cirrhosis. Hypodense 0.8 cm posterior right liver lesion is too small to characterize and unchanged (series 2/image 60). No new liver lesions. Normal gallbladder with no radiopaque cholelithiasis. No biliary ductal dilatation. Pancreas: Normal, with no mass or duct dilation. Spleen: Mild to moderate splenomegaly (craniocaudal splenic length 15.0 cm), stable. No splenic mass. Adrenals/Urinary Tract: Normal adrenals. Nonobstructing clustered lower right renal stones, largest 2 mm. No hydronephrosis. No renal masses. Normal bladder. Stomach/Bowel: Normal non-distended stomach. Normal caliber small bowel with no small bowel wall thickening. Normal diminutive appendix. Oral contrast transits to the right colon. Normal large bowel with no diverticulosis, large bowel wall thickening or pericolonic fat stranding. Vascular/Lymphatic: Atherosclerotic nonaneurysmal abdominal aorta. Patent portal, splenic, hepatic and renal veins. No pathologically enlarged lymph nodes in the abdomen or pelvis. Reproductive: Normal size prostate. Other: No pneumoperitoneum,  ascites or focal fluid collection. Musculoskeletal: Marked lumbar spondylosis. Lytic 2.9 cm left ischial lesion (series 5/image 94), increased from 2.2 cm. Patchy sclerotic lesions throughout the right ischium is unchanged. No new focal osseous lesions. IMPRESSION: 1. Interval progression of pulmonary metastases, metastatic thoracic adenopathy and widespread osseous metastatic disease, as detailed. 2. New moderate right and trace left pleural effusions. 3. Cirrhosis. Stable mild to moderate splenomegaly. 4. 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 5. Chronic findings include: Coronary atherosclerosis. Nonobstructing right nephrolithiasis. Aortic Atherosclerosis (ICD10-I70.0). Electronically Signed   By: Ilona Sorrel M.D.   On: 01/11/2022 19:13    PERFORMANCE STATUS (ECOG) : 2 - Symptomatic, <50% confined to bed  Review of Systems Unless otherwise noted, a complete review of systems is negative.  Physical Exam General: NAD Pulmonary: Unlabored Extremities: no edema, no joint deformities Skin: no rashes Neurological: Weakness but otherwise nonfocal  IMPRESSION: Patient was in after my clinic schedule today at Dr. Elroy Channel request.  Dr. [name] requested that I reviewed the MOST form.  Unfortunately, patient with recent evidence of disease progression and is being rotated to [facility].  Patient recognizes that he is doing poorly.  Hospice has been recommended in the event that he is intolerable to [facility] or with further decline.  I reviewed the MOST form with patient and daughter via phone.  Patient said that he does not think that he would want resuscitation nor would he want his life prolonged artificial machines.  However, he took the MOST form home to discuss with his family.  PLAN: -Continue current scope of treatment -MOST Form reviewed and patient took home to discuss with family -RTC 3 weeks  Case and plan discussed with Dr. [name]  Patient expressed understanding and was in agreement with this plan. He also understands that He can call the clinic at any time with any questions, concerns, or complaints.     Time Total: 20 minutes  Visit consisted of counseling and education dealing with the complex and emotionally intense issues of symptom management and palliative care in the setting of serious and potentially life-threatening illness.Greater than 50%  of this time was spent counseling and coordinating care related to the above assessment and plan.  Signed by: Altha Harm, PhD, NP-C

## 2022-01-15 NOTE — Telephone Encounter (Signed)
Oral Oncology Patient Advocate Encounter  Prior Authorization for Frankey Poot has been approved.    PA# [id number] Effective dates: 01/15/2022 through 07/14/2022  Patients co-pay is $2,999.47.    Lady Deutscher, CPhT-Adv Pharmacy Patient Advocate Specialist [place] Patient Advocate Team Direct Number: [phone]  Fax: [phone]

## 2022-01-15 NOTE — Telephone Encounter (Signed)
Oral Chemotherapy Pharmacist Encounter   Received confirmation from [facility] that they received the complete assistance application and are processing it,   Darl Pikes, PharmD, BCPS, BCOP, CPP Hematology/Oncology Clinical Pharmacist [place]/DB/AP [facility] [phone]  01/15/2022 4:02 PM

## 2022-01-15 NOTE — Progress Notes (Signed)
[place]  [phone] Fax:(336) [phone]  Patient Care Team: Venia Carbon, MD as PCP - General [name], RN as Oncology Nurse Navigator [name], MD as Consulting Physician (Hematology and Oncology)   Name of the patient: Cory [name]  [id number]  20-May-1956   Date of visit: 01/15/22  HPI: Patient is a 66 y.o. male with progressive metastatic esophageal adenocarcinoma. Planned treatment with Lonsurf (trifluridine/tipiracil)   Reason for Consult: Lonsurf oral chemotherapy education.   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   Family history of brain cancer    Family history of colon cancer    Family history of melanoma    Family history of stomach cancer    GERD (gastroesophageal reflux disease)    History of kidney stones    Nephrolithiasis    [facility]   Pancreatitis, acute    Personal history of colonic polyps    Dr Nicolasa Ducking   Psoriasis     HEMATOLOGY/ONCOLOGY HISTORY:  Oncology History  Esophageal adenocarcinoma ([place])  06/23/2020 Initial Diagnosis   Esophageal adenocarcinoma ([place])   06/28/2020 Cancer Staging   Staging form: Esophagus - Adenocarcinoma, AJCC 8th Edition - Clinical stage from 06/28/2020: Stage IVB (cTX, cN1, cM1) - Signed by [name], MD on 06/28/2020   07/03/2020 - 07/06/2021 Chemotherapy   Patient is on Treatment Plan : GASTROESOPHAGEAL FOLFOX/Kanjintiq14d x 6 cycles + Keytruda '400mg'$  q6w      02/13/2021 - 02/13/2021 Chemotherapy   Patient is on Treatment Plan : GE junction Trastuzumab q14d     08/06/2021 - 08/06/2021 Chemotherapy   Patient is on Treatment Plan : GASTROESOPHAGEAL fam-trastuzumab deruxtecan-nxki (Enhertu) q21d     08/30/2021 -  Chemotherapy   Patient is on Treatment Plan : GE junction       ALLERGIES:  has No Known Allergies.  MEDICATIONS:  Current Outpatient Medications  Medication  Sig Dispense Refill   dexamethasone (DECADRON) 4 MG tablet Take 2 tablets (8 mg total) by mouth daily. Start the day after chemotherapy for 2 days. Take with food. 30 tablet 1   fentaNYL (DURAGESIC) 25 MCG/HR Place 1 patch onto the skin every 3 (three) days. 10 patch 0   lidocaine-prilocaine (EMLA) cream Apply 1 application. topically as needed. Apply small amount to port site at least 1 hour prior to it being accessed, cover with plastic wrap 30 g 3   loperamide (IMODIUM) 2 MG capsule Take 1 capsule (2 mg total) by mouth as needed. Take 2 at diarrhea onset , then 1 every 2hr until 12hrs with no BM. May take 2 every 4hrs at night. If diarrhea recurs repeat. (Patient not taking: Reported on 11/20/2021) 30 capsule 3   Oxycodone HCl 10 MG TABS Take 1 tablet (10 mg total) by mouth every 4 (four) hours as needed. 120 tablet 0   pantoprazole (PROTONIX) 40 MG tablet TAKE 1 TABLET BY MOUTH TWICE A DAY BEFORE MEALS 60 tablet 11   potassium chloride (KLOR-CON M) 10 MEQ tablet Take 1 tablet (10 mEq total) by mouth 2 (two) times daily. 60 tablet 0   prochlorperazine (COMPAZINE) 10 MG tablet Take 1 tablet (10 mg total) by mouth every 6 (six) hours as needed for nausea or vomiting. (Patient not taking: Reported on 11/20/2021) 30 tablet 0   No current facility-administered medications for this visit.   Facility-Administered Medications Ordered in Other Visits  Medication Dose Route Frequency Provider Last Rate  Last Admin   0.9 % NaCl with KCl 20 mEq/ L  infusion   Intravenous Continuous [name], MD 999 mL/hr at 01/15/22 1039 New Bag at 01/15/22 1039    VITAL SIGNS: There were no vitals taken for this visit. There were no vitals filed for this visit.  Estimated body mass index is 22.5 kg/m as calculated from the following:   Height as of an earlier encounter on 01/15/22: '5\' 11"'$  (1.803 m).   Weight as of an earlier encounter on 01/15/22: 73.2 kg (161 lb 4.8 oz).  LABS: CBC:    Component Value Date/Time    WBC 6.7 01/15/2022 0903   HGB 10.6 (L) 01/15/2022 0903   HCT 34.2 (L) 01/15/2022 0903   PLT 265 01/15/2022 0903   MCV 91.7 01/15/2022 0903   NEUTROABS 5.5 01/15/2022 0903   LYMPHSABS 0.6 (L) 01/15/2022 0903   MONOABS 0.5 01/15/2022 0903   EOSABS 0.0 01/15/2022 0903   BASOSABS 0.0 01/15/2022 0903   Comprehensive Metabolic Panel:    Component Value Date/Time   NA 140 01/15/2022 0903   K 3.0 (L) 01/15/2022 0903   CL 111 01/15/2022 0903   CO2 21 (L) 01/15/2022 0903   BUN 9 01/15/2022 0903   CREATININE 0.78 01/15/2022 0903   GLUCOSE 140 (H) 01/15/2022 0903   CALCIUM 8.4 (L) 01/15/2022 0903   AST 30 01/15/2022 0903   ALT 14 01/15/2022 0903   ALKPHOS 232 (H) 01/15/2022 0903   BILITOT 1.2 01/15/2022 0903   PROT 5.7 (L) 01/15/2022 0903   ALBUMIN 3.4 (L) 01/15/2022 0903     Present during today's visit: patient (and his daughter on the telephone)  Start plan: start date pending medication access   Patient Education I spoke with patient for overview of new oral chemotherapy medication: capecitabine   CMP/CBC from 01/15/22 assessed, no relevant lab abnormalities. Prescription dose and frequency assessed.   Administration: Counseled patient on administration, dosing, side effects, monitoring, drug-food interactions, safe handling, storage, and disposal. Patient will take 3 tablets (60 mg of trifluridine) by mouth 2 (two) times daily after a meal. Take within 1 hr after AM & PM meals on days 1-5, 8-12. Repeat every 28 days.  Side Effects: Side effects include but not limited to: diarrhea, fatigue, nausea, decreased wbc/hgb/plt.    Drug-drug Interactions (DDI): No current DDIs with Lonsurf  Adherence: After discussion with patient no patient barriers to medication adherence identified.  Reviewed with patient importance of keeping a medication schedule and plan for any missed doses.  Cory [name] voiced understanding and appreciation. All questions answered. Medication handout  provided.  Provided patient with [facility] phone number. Patient knows to call the office with questions or concerns. Oral Chemotherapy Navigation Clinic will continue to follow.  Patient expressed understanding and was in agreement with this plan. He also understands that He can call clinic at any time with any questions, concerns, or complaints.   Medication Access Issues: Patient has a high copay and application for manufacturer assistance will be completed  Follow-up plan: RTC based on Lonsurff start date  Thank you for allowing me to participate in the care of this patient.   Time Total: 25 mins  Visit consisted of counseling and education on dealing with issues of symptom management in the setting of serious and potentially life-threatening illness.Greater than 50%  of this time was spent counseling and coordinating care related to the above assessment and plan.  Signed by: Darl Pikes, PharmD, BCPS,  BCOP, CPP Hematology/Oncology Clinical Pharmacist Practitioner [place]/DB/AP Oral [facility] [phone]  01/15/2022 11:44 AM

## 2022-01-15 NOTE — Telephone Encounter (Signed)
Oral Oncology Patient Advocate Encounter   Received notification that prior authorization for Lonsurf is required.   PA submitted on 01/15/2022 Key [id number]  Status is pending     Lady Deutscher, CPhT-Adv Pharmacy Patient Advocate Specialist [place] Patient Advocate Team Direct Number: [phone]  Fax: [phone]

## 2022-01-15 NOTE — Telephone Encounter (Signed)
Oral Chemotherapy Pharmacist Encounter   Application for Lonsurf patient assistance faxed to [facility] Patient Support today 01/15/22. Will follow-up on assistance approval status.   Darl Pikes, PharmD, BCPS, BCOP, CPP Hematology/Oncology Clinical Pharmacist [place]/DB/AP [facility] [phone]  01/15/2022 4:00 PM

## 2022-01-15 NOTE — Progress Notes (Signed)
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 CD9-NRG1 fusion. BRAF 581L, CCN E1 gain.  EMLA for fusion of BX [id number], FGF 23 gain, FGF 6 gain, MET R988C, T p53 S1 25F. ERBB2 gain but no other actionable mutations repeat NGS testing at progression showed loss of HER2 expression.  He was therefore switched to FOLFIRI chemotherapy.   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  Case discussed with neurosurgery and this spot was thought to be difficult to biopsy.    MRI brain in December 2022 showed significant enlargement of this mass to 4 cm with associated edema in the bilateral occipital lobes and marginal hemorrhage along the left aspect of the mass.  Increased subdural hematoma along the left cerebral convexity.  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  Patient received [place] radiation since single fraction to this mass.  Patient also has an intra choroidal met for which he will be seeing retina specialist at [facility]   Also noted to have fracture of the right upper extremity  possibly pathologic and underwent palliative radiation for the same.   Disease progression in February 2023 and patient was switched to FOLFIRI chemotherapy    Interval history-he is doing poorly overall.  He is continuing to lose weight and appetite is poor.  Pain is relatively well controlled with oxycodone and fentanyl patch.  His wife is presently on hospice at home  ECOG PS- 2 Pain scale- 3 Opioid associated constipation- no  Review of systems- Review of Systems  Constitutional:  Positive for malaise/fatigue and weight loss. Negative for chills and fever.       Loss of appetite  HENT:  Negative for congestion, ear discharge and nosebleeds.   Eyes:  Negative for blurred vision.  Respiratory:  Negative for cough, hemoptysis, sputum production, shortness of breath and wheezing.   Cardiovascular:  Negative for chest pain, palpitations, orthopnea and claudication.  Gastrointestinal:  Negative for abdominal pain, blood in stool, constipation, diarrhea, heartburn, melena, nausea and vomiting.  Genitourinary:  Negative for dysuria, flank pain, frequency, hematuria and urgency.  Musculoskeletal:  Negative for back pain, joint pain and myalgias.  Skin:  Negative for rash.  Neurological:  Negative for dizziness, tingling, focal weakness, seizures, weakness and headaches.  Endo/Heme/Allergies:  Does not bruise/bleed easily.  Psychiatric/Behavioral:  Negative for depression and suicidal ideas. The patient does not have insomnia.       No Known Allergies   Past Medical History:  Diagnosis Date   Esophageal adenocarcinoma ([place]) 06/23/2020   Family history of brain cancer    Family history of colon cancer    Family history of melanoma    Family history of stomach cancer  GERD (gastroesophageal reflux disease)    History of kidney stones    Nephrolithiasis    [facility]   Pancreatitis, acute    Personal history of colonic polyps    Dr Nicolasa Ducking   Psoriasis      Past Surgical History:   Procedure Laterality Date   COLONOSCOPY     COLONOSCOPY WITH PROPOFOL N/A 03/31/2020   Procedure: COLONOSCOPY WITH PROPOFOL;  Surgeon: Jonathon Bellows, MD;  Location: [facility] ENDOSCOPY;  Service: Gastroenterology;  Laterality: N/A;   ERCP     ESOPHAGOGASTRODUODENOSCOPY (EGD) WITH PROPOFOL N/A 06/19/2020   Procedure: ESOPHAGOGASTRODUODENOSCOPY (EGD) WITH PROPOFOL;  Surgeon: Jonathon Bellows, MD;  Location: [facility] ENDOSCOPY;  Service: Gastroenterology;  Laterality: N/A;   groin surgery     as a child   PORTA CATH INSERTION N/A 06/28/2020   Procedure: PORTA CATH INSERTION;  Surgeon: Algernon Huxley, MD;  Location: [place] CV LAB;  Service: Cardiovascular;  Laterality: N/A;   SHOULDER SURGERY     UPPER EXTREMITY VENOGRAPHY Right 07/24/2021   Procedure: UPPER EXTREMITY VENOGRAPHY;  Surgeon: Katha Cabal, MD;  Location: [place] CV LAB;  Service: Cardiovascular;  Laterality: Right;    Social History   Socioeconomic History   Marital status: Married    Spouse name: Not on file   Number of children: 2   Years of education: Not on file   Highest education level: Not on file  Occupational History   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  Tobacco Use   Smoking status: Former    Types: Cigarettes    Quit date: 07/08/1988    Years since quitting: 33.5   Smokeless tobacco: Never  Vaping Use   Vaping Use: Never used  Substance and Sexual Activity   Alcohol use: No    Comment: recovering alcoholic for 13 years   Drug use: No   Sexual activity: Not Currently    Birth control/protection: Inserts  Other Topics Concern   Not on file  Social History Narrative   Has living will--needs to get notarized   Daughter should be health care POA (wife with dementia)   Would accept resuscitation   Would accept tube feeds depending on the circumstance   Social Determinants of Health   Financial Resource Strain: Not on file  Food Insecurity: Not on file  Transportation  Needs: Not on file  Physical Activity: Not on file  Stress: Not on file  Social Connections: Not on file  Intimate Partner Violence: Not on file    Family History  Problem Relation Age of Onset   Stomach cancer Mother    Diabetes Brother    Hypertension Brother    Hypertension Father    Prostate cancer Father    Colon cancer Sister    Cancer Other        either cervical or ovarian, d. 11s   Brain cancer Sister      Current Outpatient Medications:    dexamethasone (DECADRON) 4 MG tablet, Take 2 tablets (8 mg total) by mouth daily. Start the day after chemotherapy for 2 days. Take with food., Disp: 30 tablet, Rfl: 1   fentaNYL (DURAGESIC) 25 MCG/HR, Place 1 patch onto the skin every 3 (three) days., Disp: 10 patch, Rfl: 0   lidocaine-prilocaine (EMLA) cream, Apply 1 application. topically as needed. Apply small amount to port site at least 1 hour prior to it being accessed, cover with plastic wrap, Disp: 30 g, Rfl: 3   Oxycodone HCl  10 MG TABS, Take 1 tablet (10 mg total) by mouth every 4 (four) hours as needed., Disp: 120 tablet, Rfl: 0   pantoprazole (PROTONIX) 40 MG tablet, TAKE 1 TABLET BY MOUTH TWICE A DAY BEFORE MEALS, Disp: 60 tablet, Rfl: 11   loperamide (IMODIUM) 2 MG capsule, Take 1 capsule (2 mg total) by mouth as needed. Take 2 at diarrhea onset , then 1 every 2hr until 12hrs with no BM. May take 2 every 4hrs at night. If diarrhea recurs repeat. (Patient not taking: Reported on 11/20/2021), Disp: 30 capsule, Rfl: 3   potassium chloride (KLOR-CON M) 10 MEQ tablet, Take 1 tablet (10 mEq total) by mouth 2 (two) times daily., Disp: 60 tablet, Rfl: 0   prochlorperazine (COMPAZINE) 10 MG tablet, Take 1 tablet (10 mg total) by mouth every 6 (six) hours as needed for nausea or vomiting. (Patient not taking: Reported on 11/20/2021), Disp: 30 tablet, Rfl: 0   trifluridine-tipiracil (LONSURF) 20-8.19 MG tablet, Take 60 mg of trifluridine by mouth 2 (two) times daily after a meal. Take  within 1 hr after AM & PM meals on days 1-5, 8-12. Repeat every 28 days., Disp: , Rfl:  No current facility-administered medications for this visit.  Facility-Administered Medications Ordered in Other Visits:    0.9 % NaCl with KCl 20 mEq/ L  infusion, , Intravenous, Continuous, [name], MD, Stopped at 01/15/22 1143  Physical exam:  Vitals:   01/15/22 0924  BP: (!) 141/71  Pulse: 74  Resp: 16  Temp: (!) 97.4 F (36.3 C)  TempSrc: Oral  Weight: 161 lb 4.8 oz (73.2 kg)  Height: 5' 11" (1.803 m)   Physical Exam Constitutional:      Comments: Appears fatigued and somewhat frail  Cardiovascular:     Rate and Rhythm: Normal rate and regular rhythm.     Heart sounds: Normal heart sounds.  Pulmonary:     Effort: Pulmonary effort is normal.     Breath sounds: Normal breath sounds.  Abdominal:     General: Bowel sounds are normal.     Palpations: Abdomen is soft.  Skin:    General: Skin is warm and dry.  Neurological:     Mental Status: He is alert and oriented to person, place, and time.         Latest Ref Rng & Units 01/15/2022    9:03 AM  CMP  Glucose 70 - 99 mg/dL 140   BUN 8 - 23 mg/dL 9   Creatinine 0.61 - 1.24 mg/dL 0.78   Sodium 135 - 145 mmol/L 140   Potassium 3.5 - 5.1 mmol/L 3.0   Chloride 98 - 111 mmol/L 111   CO2 22 - 32 mmol/L 21   Calcium 8.9 - 10.3 mg/dL 8.4   Total Protein 6.5 - 8.1 g/dL 5.7   Total Bilirubin 0.3 - 1.2 mg/dL 1.2   Alkaline Phos 38 - 126 U/L 232   AST 15 - 41 U/L 30   ALT 0 - 44 U/L 14       Latest Ref Rng & Units 01/15/2022    9:03 AM  CBC  WBC 4.0 - 10.5 K/uL 6.7   Hemoglobin 13.0 - 17.0 g/dL 10.6   Hematocrit 39.0 - 52.0 % 34.2   Platelets 150 - 400 K/uL 265     No images are attached to the encounter.  NM Bone Scan Whole Body  Result Date: 01/12/2022 CLINICAL DATA:  Metastatic esophageal cancer EXAM: NUCLEAR MEDICINE WHOLE BODY  BONE SCAN TECHNIQUE: Whole body anterior and posterior images were obtained approximately 3  hours after intravenous injection of radiopharmaceutical. RADIOPHARMACEUTICALS:  23.44 mCi Technetium-68mMDP IV COMPARISON:  CT chest abdomen pelvis 01/11/2022, bone scan 10/31/2021, PET CT 05/22/2021 FINDINGS: Widespread skeletal metastatic disease involving the sternum, calvarium, right humerus, bilateral ribs, pelvic bones, spine and left femur. Small focus of activity at the proximal left tibia without change. 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 The overall distribution of activity is grossly similar as compared with the prior bone scan though there may be slightly more intense sternal activity. No definitive new focus of activity is visualized. Physiologic renal and bladder activity. IMPRESSION: Findings consistent with widespread skeletal metastatic disease with overall grossly stable distribution of activity since prior bone scan with exception of slightly more intense activity involving the sternum. Electronically Signed   By: KDonavan FoilM.D.   On: 01/12/2022 21:54   CT CHEST ABDOMEN PELVIS W CONTRAST  Result Date: 01/11/2022 CLINICAL DATA:  Metastatic esophageal cancer on chemotherapy. Restaging. * Tracking Code: BO * EXAM: CT CHEST, ABDOMEN, AND PELVIS WITH CONTRAST TECHNIQUE: Multidetector CT imaging of the chest, abdomen and pelvis was performed following the standard protocol during bolus administration of intravenous contrast. RADIATION DOSE REDUCTION: This exam was performed according to the departmental dose-optimization program which includes automated exposure control, adjustment of the mA and/or kV according to patient size and/or use of iterative reconstruction technique. CONTRAST:  102mOMNIPAQUE IOHEXOL 300 MG/ML  SOLN COMPARISON:  10/31/2021 CT chest, abdomen and pelvis. FINDINGS: CT CHEST FINDINGS Cardiovascular: Normal heart size. No significant pericardial effusion/thickening. Left anterior descending  and right coronary atherosclerosis. Right internal jugular Port-A-Cath terminates in the lower third of the SVC. Atherosclerotic nonaneurysmal thoracic aorta. Normal caliber pulmonary arteries. No central pulmonary emboli. Mediastinum/Nodes: No discrete thyroid nodules. Small amount of oral contrast throughout the thoracic esophageal lumen. No discrete thoracic esophageal mass. No axillary adenopathy. Mildly enlarged 1.4 cm short axis diameter right paratracheal node (series 2/image 25), increased from 0.8 cm. Newly enlarged 1.4 cm left hilar node (series 2/image 29). No pathologically enlarged right hilar nodes. Lungs/Pleura: No pneumothorax. Moderate dependent right pleural effusion is essentially new. New trace dependent left pleural effusion. Innumerable (> 20) solid pulmonary nodules scattered throughout both lungs, increased. Representative medial right middle lobe 2.7 x 1.6 cm nodule (series 4/image 90), increased from 2.1 x 1.3 cm. Representative medial left lower lobe 0.9 cm nodule (series 4/image 125), increased from 0.7 cm. Representative 0.5 cm posterior right lower lobe pulmonary nodule (series 4/image 124), not definitely seen on prior CT. Musculoskeletal: Innumerable subtle permeative osseous lesions throughout the thoracic skeleton including sternum, manubrium, thoracic spine, ribs and right humerus, mildly increased. Representative 2.6 cm manubrial lesion (series 6/image 131), increased from 2.3 cm. Representative 1.8 cm T8 vertebral lesion (series 6/image 123), increased from 1.5 cm. 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 Moderate thoracic spondylosis. CT ABDOMEN PELVIS FINDINGS Hepatobiliary: Diffusely irregular liver surface compatible with cirrhosis. Hypodense 0.8 cm posterior right liver lesion is too small to characterize and unchanged (series 2/image 60). No new liver lesions. Normal gallbladder with no radiopaque cholelithiasis. No biliary ductal  dilatation. Pancreas: Normal, with no mass or duct dilation. Spleen: Mild to moderate splenomegaly (craniocaudal splenic length 15.0 cm), stable. No splenic mass. Adrenals/Urinary Tract: Normal adrenals. Nonobstructing clustered lower right renal stones, largest 2 mm. No hydronephrosis. No renal masses. Normal bladder.  Stomach/Bowel: Normal non-distended stomach. Normal caliber small bowel with no small bowel wall thickening. Normal diminutive appendix. Oral contrast transits to the right colon. Normal large bowel with no diverticulosis, large bowel wall thickening or pericolonic fat stranding. Vascular/Lymphatic: Atherosclerotic nonaneurysmal abdominal aorta. Patent portal, splenic, hepatic and renal veins. No pathologically enlarged lymph nodes in the abdomen or pelvis. Reproductive: Normal size prostate. Other: No pneumoperitoneum, ascites or focal fluid collection. Musculoskeletal: Marked lumbar spondylosis. Lytic 2.9 cm left ischial lesion (series 5/image 94), increased from 2.2 cm. Patchy sclerotic lesions throughout the right ischium is unchanged. No new focal osseous lesions. IMPRESSION: 1. Interval progression of pulmonary metastases, metastatic thoracic adenopathy and widespread osseous metastatic disease, as detailed. 2. New moderate right and trace left pleural effusions. 3. Cirrhosis. Stable mild to moderate splenomegaly. 4. 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 5. Chronic findings include: Coronary atherosclerosis. Nonobstructing right nephrolithiasis. Aortic Atherosclerosis (ICD10-I70.0). Electronically Signed   By: Ilona Sorrel M.D.   On: 01/11/2022 19:13     Assessment and plan- Patient is a 66 y.o. male with metastatic esophageal adenocarcinoma here to discuss further goals of care  Patient is presently on second line FOLFIRI chemotherapy.  CEA over the last 2 months has been increasing.  I haveReviewed his CT chest abdomen pelvis images independently  and discussed findings with the patient which overall shows progression of disease with worsening lung as well as bone metastases.  His performance status is also going down and he is continuing to lose weight.  He has lost about 5 pounds in the last 1 month.  We discussed attempting third line Lonsurf 2 weeks on and 2 weeks of until progression or toxicity.  Discussed risks and benefits of Lonsurf including all but not limited to nausea, vomiting, diarrhea, low blood counts and risk of infection.   TAGS trial of 507 heavily pretreated patients (over 60 percent of the patients in each group had received three or more prior chemotherapy regimens) with adenocarcinoma of the stomach or EGJ, trifluridine-tipiracil (35 mg/m2 orally twice daily on days 1 through 5 and 8 through 12 of each 28-day cycle) significantly improved overall survival over placebo (median 5.7 versus 3.6 months) and was reasonably well tolerated. The most frequent grade 3 or higher adverse effects were neutropenia and anemia in the trifluridine-tipiracil group.   Also discussed that foregoing any systemic treatment at this time and proceeding with home hospice is also an option.  Patient would like to see how he would tolerate it and then decide if he wants to continue treatment versus proceed with home hospice.  We also discussed CODE STATUS and treatment preferences.  Patient would like to take the MOST form home and discussed with family.  However he is leaning towords being a DNR/DNI  We will hold Zometa today given that his calcium is 8.4.  I will see him back in 3 weeks with labs for possible Zometa and to see how he is tolerating Lonsurf  Hypokalemia: Continue oral potassium  Neoplasm related pain: Continue fentanyl patch and as needed oxycodone     Visit Diagnosis 1. Esophageal adenocarcinoma ([place])   2. Goals of care, counseling/discussion      Dr. Randa Evens, MD, MPH [facility] [phone] 01/15/2022 12:28 PM

## 2022-01-16 LAB — CEA: CEA: 201 ng/mL — ABNORMAL HIGH (ref 0.0–4.7)

## 2022-01-16 NOTE — Telephone Encounter (Signed)
Oral Chemotherapy Pharmacist Encounter   Received fax from [facility] that patient was approved from assistance from 01/16/22-07/07/22.  Attempted to call patient to let him know, but call went to VM and VM was full. Called patient's daughter Arville Go who was on the phone during his visit last week. Provided Joann with the above information and the phone number to [facility] to call for medication delivery.   I asked that she call the office to let us know when his medication will be delivered so that we could coordinate a start date.   Darl Pikes, PharmD, BCPS, BCOP, CPP Hematology/Oncology Clinical Pharmacist [place]/DB/AP Oral [facility] [phone]  01/16/2022 4:14 PM

## 2022-01-21 ENCOUNTER — Telehealth: Payer: Self-pay | Admitting: Pharmacist

## 2022-01-21 DIAGNOSIS — C159 Malignant neoplasm of esophagus, unspecified: Secondary | ICD-10-CM

## 2022-01-21 MED ORDER — ONDANSETRON HCL 8 MG PO TABS: 8.0000 mg | ORAL_TABLET | Freq: Three times a day (TID) | ORAL | 0 refills | Status: AC | PRN

## 2022-01-21 NOTE — Telephone Encounter (Signed)
Per patient, Cory [name] was delivered on 01/19/22

## 2022-01-21 NOTE — Telephone Encounter (Signed)
Oral Chemotherapy Pharmacist Encounter   Patient called to let me know that his Lonsurf was delivered. He will be starting his Lonsurf today 01/21/22. Reviewed the dosing, administration, and side effects with patient and his daughter. Medication calendar will be mailed to his house.  His current f/u appt is timed well, on day 16 of his cycle 1.   Patient currently only has prochlorperazine at home, ondansetron prescription was sent to his local pharmacy.   Both patient and his daughter stated their understanding of the plan.    Darl Pikes, PharmD, BCPS, BCOP, CPP Hematology/Oncology Clinical Pharmacist [place]/DB/AP Oral [facility] [phone]  01/21/2022 11:31 AM

## 2022-01-26 ENCOUNTER — Observation Stay
Admission: EM | Admit: 2022-01-26 | Discharge: 2022-01-28 | Disposition: A | Discharge status: Home / Self Care | Payer: Medicare Other | Attending: Internal Medicine | Admitting: Internal Medicine

## 2022-01-26 ENCOUNTER — Emergency Department: Payer: Medicare Other

## 2022-01-26 ENCOUNTER — Other Ambulatory Visit: Payer: Self-pay

## 2022-01-26 DIAGNOSIS — D638 Anemia in other chronic diseases classified elsewhere: Secondary | ICD-10-CM

## 2022-01-26 DIAGNOSIS — I480 Paroxysmal atrial fibrillation: Secondary | ICD-10-CM | POA: Diagnosis not present

## 2022-01-26 DIAGNOSIS — J90 Pleural effusion, not elsewhere classified: Secondary | ICD-10-CM | POA: Diagnosis not present

## 2022-01-26 DIAGNOSIS — Z7901 Long term (current) use of anticoagulants: Secondary | ICD-10-CM | POA: Diagnosis not present

## 2022-01-26 DIAGNOSIS — K219 Gastro-esophageal reflux disease without esophagitis: Secondary | ICD-10-CM

## 2022-01-26 DIAGNOSIS — C159 Malignant neoplasm of esophagus, unspecified: Secondary | ICD-10-CM | POA: Diagnosis not present

## 2022-01-26 DIAGNOSIS — D649 Anemia, unspecified: Secondary | ICD-10-CM | POA: Insufficient documentation

## 2022-01-26 DIAGNOSIS — Z20822 Contact with and (suspected) exposure to covid-19: Secondary | ICD-10-CM | POA: Diagnosis not present

## 2022-01-26 DIAGNOSIS — C7800 Secondary malignant neoplasm of unspecified lung: Secondary | ICD-10-CM | POA: Insufficient documentation

## 2022-01-26 DIAGNOSIS — C7951 Secondary malignant neoplasm of bone: Secondary | ICD-10-CM | POA: Insufficient documentation

## 2022-01-26 DIAGNOSIS — R0602 Shortness of breath: Secondary | ICD-10-CM | POA: Diagnosis present

## 2022-01-26 DIAGNOSIS — I5189 Other ill-defined heart diseases: Secondary | ICD-10-CM

## 2022-01-26 DIAGNOSIS — Z66 Do not resuscitate: Secondary | ICD-10-CM | POA: Diagnosis not present

## 2022-01-26 DIAGNOSIS — I2699 Other pulmonary embolism without acute cor pulmonale: Secondary | ICD-10-CM | POA: Diagnosis not present

## 2022-01-26 DIAGNOSIS — G8929 Other chronic pain: Secondary | ICD-10-CM | POA: Diagnosis not present

## 2022-01-26 DIAGNOSIS — C787 Secondary malignant neoplasm of liver and intrahepatic bile duct: Secondary | ICD-10-CM | POA: Diagnosis not present

## 2022-01-26 DIAGNOSIS — K7460 Unspecified cirrhosis of liver: Secondary | ICD-10-CM | POA: Diagnosis not present

## 2022-01-26 LAB — CBC WITH DIFFERENTIAL/PLATELET
Abs Immature Granulocytes: 0.3 10*3/uL — ABNORMAL HIGH (ref 0.00–0.07)
Basophils Absolute: 0.1 10*3/uL (ref 0.0–0.1)
Basophils Relative: 1 %
Eosinophils Absolute: 0.2 10*3/uL (ref 0.0–0.5)
Eosinophils Relative: 2 %
HCT: 36.8 % — ABNORMAL LOW (ref 39.0–52.0)
Hemoglobin: 11.3 g/dL — ABNORMAL LOW (ref 13.0–17.0)
Immature Granulocytes: 3 %
Lymphocytes Relative: 8 %
Lymphs Abs: 0.9 10*3/uL (ref 0.7–4.0)
MCH: 28.3 pg (ref 26.0–34.0)
MCHC: 30.7 g/dL (ref 30.0–36.0)
MCV: 92 fL (ref 80.0–100.0)
Monocytes Absolute: 0.9 10*3/uL (ref 0.1–1.0)
Monocytes Relative: 7 %
Neutro Abs: 9.4 10*3/uL — ABNORMAL HIGH (ref 1.7–7.7)
Neutrophils Relative %: 79 %
Platelets: 272 10*3/uL (ref 150–400)
RBC: 4 MIL/uL — ABNORMAL LOW (ref 4.22–5.81)
RDW: 16.6 % — ABNORMAL HIGH (ref 11.5–15.5)
WBC: 11.8 10*3/uL — ABNORMAL HIGH (ref 4.0–10.5)
nRBC: 0 % (ref 0.0–0.2)

## 2022-01-26 LAB — TROPONIN I (HIGH SENSITIVITY): Troponin I (High Sensitivity): 36 ng/L — ABNORMAL HIGH (ref <18)

## 2022-01-26 LAB — COMPREHENSIVE METABOLIC PANEL
ALT: 13 U/L (ref 0–44)
AST: 28 U/L (ref 15–41)
Albumin: 3.4 g/dL — ABNORMAL LOW (ref 3.5–5.0)
Alkaline Phosphatase: 246 U/L — ABNORMAL HIGH (ref 38–126)
Anion gap: 6 (ref 5–15)
BUN: 8 mg/dL (ref 8–23)
CO2: 22 mmol/L (ref 22–32)
Calcium: 8.8 mg/dL — ABNORMAL LOW (ref 8.9–10.3)
Chloride: 113 mmol/L — ABNORMAL HIGH (ref 98–111)
Creatinine, Ser: 0.82 mg/dL (ref 0.61–1.24)
GFR, Estimated: >60 mL/min (ref >60)
Glucose, Bld: 116 mg/dL — ABNORMAL HIGH (ref 70–99)
Potassium: 3.9 mmol/L (ref 3.5–5.1)
Sodium: 141 mmol/L (ref 135–145)
Total Bilirubin: 1.4 mg/dL — ABNORMAL HIGH (ref 0.3–1.2)
Total Protein: 6.1 g/dL — ABNORMAL LOW (ref 6.5–8.1)

## 2022-01-26 LAB — BRAIN NATRIURETIC PEPTIDE: B Natriuretic Peptide: 401 pg/mL — ABNORMAL HIGH (ref 0.0–100.0)

## 2022-01-26 NOTE — ED Triage Notes (Signed)
To triage with c/o SOB, worsens with lying down. Hx of esophogeal CA with mets. Currently doing oral chemo. Does not wear 02 at home. Denies cough, fever or chest pain Daughter reports decreased appetite today and increased sleeping, which is unusual for pt.

## 2022-01-26 NOTE — ED Provider Notes (Signed)
[facility] Provider Note    Event Date/Time   First MD Initiated Contact with Patient 01/26/22 2302     (approximate)   History   Shortness of Breath   HPI  Cory [name] is a 66 y.o. 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  Slightly worse with laying down.  Progressing to the point he feels he cannot get a breath when he is sleeping  No pain.  Endorses some chills but no fevers.  Currently on oral chemotherapeutic and follows with Dr. [name]     Physical Exam   Triage Vital Signs: ED Triage Vitals  Enc Vitals Group     BP 01/26/22 2033 (!) 178/90     Pulse Rate 01/26/22 2033 92     Resp 01/26/22 2033 20     Temp 01/26/22 2033 97.9 F (36.6 C)     Temp Source 01/26/22 2033 Oral     SpO2 01/26/22 2033 98 %     Weight 01/26/22 2051 160 lb (72.6 kg)     Height 01/26/22 2051 '5\' 11"'$  (1.803 m)     Head Circumference --      Peak Flow --      Pain Score 01/26/22 2051 0     Pain Loc --      Pain Edu? --      Excl. in [place]? --     Most recent vital signs: Vitals:   01/27/22 0100 01/27/22 0112  BP: (!) 161/84   Pulse: 83   Resp: (!) 22   Temp:  98.5 F (36.9 C)  SpO2: 99%      General: Awake, no distress.  Very slight tachypnea.  Reports mild dyspnea, I placed him on 2 L and he reports improvement.  Oxygen saturation is lowest type I saw 91% on room air, but primarily running in the 94% range and 98% on 2 L CV:  Good peripheral perfusion.  Normal tones Resp:  Normal effort.  No lung sounds noted on the right.  Clear lung sounds on the left.  Speaks in phrases.  Chest slight tachypnea. Abd:  No distention.  Soft nontender Other:  Very mild bilateral peripheral edema the patient is daughter report seems to have come about the last week   ED Results / Procedures / Treatments   Labs (all labs ordered are  listed, but only abnormal results are displayed) Labs Reviewed  CBC WITH DIFFERENTIAL/PLATELET - Abnormal; Notable for the following components:      Result Value   WBC 11.8 (*)    RBC 4.00 (*)    Hemoglobin 11.3 (*)    HCT 36.8 (*)    RDW 16.6 (*)    Neutro Abs 9.4 (*)    Abs Immature Granulocytes 0.30 (*)    All other components within normal limits  COMPREHENSIVE METABOLIC PANEL - Abnormal; Notable for the following components:   Chloride 113 (*)    Glucose, Bld 116 (*)    Calcium 8.8 (*)    Total Protein 6.1 (*)    Albumin 3.4 (*)    Alkaline Phosphatase 246 (*)    Total Bilirubin 1.4 (*)    All other components within normal limits  BRAIN NATRIURETIC PEPTIDE - Abnormal; Notable for the following components:   B Natriuretic Peptide 401.0 (*)    All other components within normal limits  TROPONIN I (HIGH SENSITIVITY) - Abnormal; Notable for the following components:   Troponin I (High Sensitivity) 36 (*)    All other components within normal limits  TROPONIN I (HIGH SENSITIVITY) - Abnormal; Notable for the following components:   Troponin I (High Sensitivity) 39 (*)    All other components within normal limits  RESP PANEL BY RT-PCR (FLU A&B, COVID) ARPGX2  CULTURE, BLOOD (ROUTINE X 2)  CULTURE, BLOOD (ROUTINE X 2)  PROCALCITONIN  PROCALCITONIN  PROTIME-INR  APTT     EKG  Interpreted by me at 2041 heart rate 85 QRS 80 QTc 470 sinus rhythm, occasional PAC.  No evidence of acute ischemia.   I reviewed his prior echocardiogram from May that shows a left ventricular mass with an EF of 50 to 55%.  RADIOLOGY  Chest x-ray interpreted by me as large right pleural effusion.       CT Head Wo Contrast  Result Date: 01/27/2022 CLINICAL DATA:  Brain/CNS neoplasm. Monitor to exclude hemorrhage prior to anticoagulation for pulmonary embolus. Previous CT PE scan of the chest 2 hours ago. EXAM: CT HEAD WITHOUT CONTRAST TECHNIQUE: Contiguous axial images were obtained from the  base of the skull through the vertex without intravenous contrast. RADIATION DOSE REDUCTION: This exam was performed according to the departmental dose-optimization program which includes automated exposure control, adjustment of the mA and/or kV according to patient size and/or use of iterative reconstruction technique. COMPARISON:  MRI brain 07/29/2021.  CT head 06/03/2025 FINDINGS: Brain: Residual contrast material in the blood pool limits the evaluation of this examination to assess for intracranial hemorrhage. As visualized no acute intracranial hemorrhages suggested. Residual contrast material could obscure visualization of small hemorrhage. Homogeneously enhancing extra-axial lesion in the right posterior frontal region measures about 1.3 cm in diameter. Homogeneously enhancing extra-axial lesion in the left anterior frontal convexity measures 1.4 cm diameter. Homogeneously enhancing lesion extra-axial in the left temporal region measures 11 mm diameter.probably another extra-axial lesion in the region of the torcula. These lesions may be multiple meningiomas or meningeal metastasis. No mass effect or midline shift. Gray-white matter junctions are distinct. Ventricles are not dilated. Vascular: No hyperdense vessel or unexpected calcification. Skull: Calvarium appears intact.  No lucent lesions are identified. Sinuses/Orbits: Paranasal sinuses and mastoid air cells are clear. Other: None. IMPRESSION: 1. No gross evidence of acute intracranial hemorrhage although residual contrast material may obscure acute hemorrhage. 2. Multiple extra-axial lesions likely hyperenhancing. These may represent multiple meningiomas or meningeal metastasis. No significant mass effect or midline shift. Electronically Signed   By: Lucienne Capers M.D.   On: 01/27/2022 01:57   CT Angio Chest PE W and/or Wo Contrast  Result Date: 01/27/2022 CLINICAL DATA:  Shortness of breath which worsens with lying down. EXAM: CT ANGIOGRAPHY  CHEST WITH CONTRAST TECHNIQUE: Multidetector CT imaging of the chest was performed using the standard protocol during bolus administration of intravenous contrast. Multiplanar CT image reconstructions and MIPs were obtained to evaluate the vascular anatomy. RADIATION DOSE REDUCTION: This exam was performed according to the departmental dose-optimization program which includes automated exposure control, adjustment of the mA and/or kV according to patient size and/or use of iterative reconstruction technique. CONTRAST:  7m OMNIPAQUE IOHEXOL 350 MG/ML SOLN COMPARISON:  January 11, 2022 FINDINGS: Cardiovascular: A right-sided venous Port-A-Cath is in place. There is moderate severity calcification of the aortic arch, without evidence of aortic aneurysm. A small amount of intraluminal low attenuation is seen involving subsegmental lower lobe branches of the left pulmonary artery.  Normal heart size with mild right heart strain (RV/LV ratio of 1.04). No pericardial effusion. Mediastinum/Nodes: There is stable AP window, pretracheal and left hilar lymphadenopathy. Thyroid gland, trachea, and esophagus demonstrate no significant findings. Lungs/Pleura: Moderate severity compressive atelectasis is seen within the right upper lobe, right middle lobe and right lower lobe. Mild posterior left lower lobe atelectatic changes are also seen. Innumerable, stable solid pulmonary nodules are again seen. There is a very large right pleural effusion which is increased in size when compared to the prior study. A small left pleural effusion is also seen. This represents a new finding when compared to the prior study. No pneumothorax is identified. Upper Abdomen: The liver is cirrhotic in appearance, with mild to moderate severity splenomegaly. Musculoskeletal: A chronic deformity is seen involving the proximal right humeral shaft. A chronic compression fracture deformity is also noted at the level of T6. Multilevel degenerative changes seen  throughout the thoracic spine. Review of the MIP images confirms the above findings. IMPRESSION: 1. Small amount of pulmonary embolism involving subsegmental lower lobe branches of the left pulmonary artery, with mild right heart strain. 2. Very large right pleural effusion which is increased in size when compared to the prior study, with associated moderate severity compressive atelectasis throughout the right lung. 3. Small left pleural effusion with mild posterior left lower lobe atelectatic changes. 4. Innumerable, stable solid pulmonary nodules consistent with pulmonary metastasis. 5. Cirrhotic appearance of the liver with mild to moderate severity splenomegaly. Aortic Atherosclerosis (ICD10-I70.0). Electronically Signed   By: Virgina Norfolk M.D.   On: 01/27/2022 00:51   DG Chest 2 View  Result Date: 01/26/2022 CLINICAL DATA:  Shortness of breath EXAM: CHEST - 2 VIEW COMPARISON:  07/05/2020 FINDINGS: Large right pleural effusion. Left lung is clear. Normal cardiomediastinal contours. Right chest wall Port-A-Cath tip in the mid SVC. IMPRESSION: Large right pleural effusion. Electronically Signed   By: Ulyses Jarred M.D.   On: 01/26/2022 21:59      PROCEDURES:  Critical Care performed: Yes, see critical care procedure note(s)  CRITICAL CARE Performed by: Delman Kitten   Total critical care time: 35 minutes  Critical care time was exclusive of separately billable procedures and treating other patients.  Critical care was necessary to treat or prevent imminent or life-threatening deterioration.  Critical care was time spent personally by me on the following activities: development of treatment plan with patient and/or surrogate as well as nursing, discussions with consultants, evaluation of patient's response to treatment, examination of patient, obtaining history from patient or surrogate, ordering and performing treatments and interventions, ordering and review of laboratory studies,  ordering and review of radiographic studies, pulse oximetry and re-evaluation of patient's condition.   Procedures   MEDICATIONS ORDERED IN ED: Medications  iohexol (OMNIPAQUE) 350 MG/ML injection 80 mL (80 mLs Intravenous Contrast Given 01/27/22 0011)     IMPRESSION / MDM / ASSESSMENT AND PLAN / ED COURSE  I reviewed the triage vital signs and the nursing notes.                              Differential diagnosis includes, but is not limited to, enlarging right pleural effusion, heart failure, volume overload, pulmonary embolism, pneumonia, viral syndrome etc.  His work of breathing is comfortable except for slight tachypnea, he reports dyspnea but when placed on 2 L nasal cannula feels improved.  Previous imaging demonstrates known pleural effusion but I suspect it may  be enlarging or causing significant dyspnea.    Patient's presentation is most consistent with acute presentation with potential threat to life or bodily function.  The patient is on the cardiac monitor to evaluate for evidence of arrhythmia and/or significant heart rate changes.  He has a slightly elevated white count and is on oral chemotherapy and has a left shift.  I do not definitively see evidence of pneumonia by chest x-ray but will follow-up with CT imaging and also ordered procalcitonin, blood cultures and COVID swab.   Clinical Course as of 01/27/22 0206  Nancy Fetter Jan 27, 2022  0102 Discussed case with Dr. [name] including pulmonary embolism now noted.  She advises to obtain a CT of his head and if there is no evidence of hemorrhage given the known history of metastases to the brain, then she would recommend initiate heparin. [MQ]    Clinical Course User Index [MQ] Delman Kitten, MD   ----------------------------------------- 2:06 AM on 01/27/2022 ----------------------------------------- CT of the head is negative for obvious hemorrhage.  Start heparin drip.  Dr. [name] advises she will provide consultation on  the patient today, will admit to hospitalist.  FINAL CLINICAL IMPRESSION(S) / ED DIAGNOSES   Final diagnoses:  SOB (shortness of breath)  PE (pulmonary thromboembolism) ([place])  Pleural effusion     Rx / DC Orders   ED Discharge Orders     None        Note:  This document was prepared using Dragon voice recognition software and may include unintentional dictation errors.   Delman Kitten, MD 01/27/22 [phone]

## 2022-01-27 ENCOUNTER — Inpatient Hospital Stay: Admit: 2022-01-27 | Payer: Medicare Other

## 2022-01-27 ENCOUNTER — Inpatient Hospital Stay: Payer: Medicare Other

## 2022-01-27 ENCOUNTER — Emergency Department: Payer: Medicare Other

## 2022-01-27 ENCOUNTER — Other Ambulatory Visit: Payer: Medicare Other

## 2022-01-27 ENCOUNTER — Other Ambulatory Visit: Payer: Self-pay

## 2022-01-27 DIAGNOSIS — R0602 Shortness of breath: Secondary | ICD-10-CM | POA: Diagnosis not present

## 2022-01-27 DIAGNOSIS — I2699 Other pulmonary embolism without acute cor pulmonale: Secondary | ICD-10-CM

## 2022-01-27 DIAGNOSIS — C779 Secondary and unspecified malignant neoplasm of lymph node, unspecified: Secondary | ICD-10-CM

## 2022-01-27 DIAGNOSIS — G8929 Other chronic pain: Secondary | ICD-10-CM

## 2022-01-27 DIAGNOSIS — D638 Anemia in other chronic diseases classified elsewhere: Secondary | ICD-10-CM

## 2022-01-27 DIAGNOSIS — I2609 Other pulmonary embolism with acute cor pulmonale: Secondary | ICD-10-CM

## 2022-01-27 DIAGNOSIS — Z800 Family history of malignant neoplasm of digestive organs: Secondary | ICD-10-CM

## 2022-01-27 DIAGNOSIS — C7800 Secondary malignant neoplasm of unspecified lung: Secondary | ICD-10-CM

## 2022-01-27 DIAGNOSIS — J90 Pleural effusion, not elsewhere classified: Secondary | ICD-10-CM

## 2022-01-27 DIAGNOSIS — Z87891 Personal history of nicotine dependence: Secondary | ICD-10-CM

## 2022-01-27 DIAGNOSIS — Z808 Family history of malignant neoplasm of other organs or systems: Secondary | ICD-10-CM

## 2022-01-27 DIAGNOSIS — C159 Malignant neoplasm of esophagus, unspecified: Secondary | ICD-10-CM | POA: Diagnosis not present

## 2022-01-27 DIAGNOSIS — C7951 Secondary malignant neoplasm of bone: Secondary | ICD-10-CM

## 2022-01-27 DIAGNOSIS — C7931 Secondary malignant neoplasm of brain: Secondary | ICD-10-CM

## 2022-01-27 LAB — PROTIME-INR
INR: 1.4 — ABNORMAL HIGH (ref 0.8–1.2)
Prothrombin Time: 17.2 seconds — ABNORMAL HIGH (ref 11.4–15.2)

## 2022-01-27 LAB — RESP PANEL BY RT-PCR (FLU A&B, COVID) ARPGX2
Influenza A by PCR: NEGATIVE
Influenza B by PCR: NEGATIVE
SARS Coronavirus 2 by RT PCR: NEGATIVE

## 2022-01-27 LAB — CBC
HCT: 34.1 % — ABNORMAL LOW (ref 39.0–52.0)
Hemoglobin: 10.4 g/dL — ABNORMAL LOW (ref 13.0–17.0)
MCH: 28 pg (ref 26.0–34.0)
MCHC: 30.5 g/dL (ref 30.0–36.0)
MCV: 91.9 fL (ref 80.0–100.0)
Platelets: 224 10*3/uL (ref 150–400)
RBC: 3.71 MIL/uL — ABNORMAL LOW (ref 4.22–5.81)
RDW: 16.9 % — ABNORMAL HIGH (ref 11.5–15.5)
WBC: 11.5 10*3/uL — ABNORMAL HIGH (ref 4.0–10.5)
nRBC: 0.2 % (ref 0.0–0.2)

## 2022-01-27 LAB — BODY FLUID CELL COUNT WITH DIFFERENTIAL
Eos, Fluid: 1 %
Lymphs, Fluid: 30 %
Monocyte-Macrophage-Serous Fluid: 43 %
Neutrophil Count, Fluid: 26 %
Total Nucleated Cell Count, Fluid: 3581 cu mm

## 2022-01-27 LAB — PROCALCITONIN
Procalcitonin: <0.1 ng/mL
Procalcitonin: <0.1 ng/mL

## 2022-01-27 LAB — PROTEIN, PLEURAL OR PERITONEAL FLUID: Total protein, fluid: 3.3 g/dL

## 2022-01-27 LAB — LACTATE DEHYDROGENASE, PLEURAL OR PERITONEAL FLUID: LD, Fluid: 436 U/L — ABNORMAL HIGH (ref 3–23)

## 2022-01-27 LAB — HEPARIN LEVEL (UNFRACTIONATED): Heparin Unfractionated: <0.1 IU/mL — ABNORMAL LOW (ref 0.30–0.70)

## 2022-01-27 LAB — HIV ANTIBODY (ROUTINE TESTING W REFLEX): HIV Screen 4th Generation wRfx: NONREACTIVE

## 2022-01-27 LAB — GLUCOSE, PLEURAL OR PERITONEAL FLUID: Glucose, Fluid: 107 mg/dL

## 2022-01-27 LAB — APTT: aPTT: 177 seconds (ref 24–36)

## 2022-01-27 LAB — TROPONIN I (HIGH SENSITIVITY): Troponin I (High Sensitivity): 39 ng/L — ABNORMAL HIGH (ref <18)

## 2022-01-27 MED ORDER — IOHEXOL 350 MG/ML SOLN
80.0000 mL | Freq: Once | INTRAVENOUS | Status: AC | PRN
  Administered 2022-01-27: 80 mL via INTRAVENOUS

## 2022-01-27 MED ORDER — ACETAMINOPHEN 650 MG RE SUPP: 650.0000 mg | Freq: Four times a day (QID) | RECTAL | Status: DC | PRN

## 2022-01-27 MED ORDER — OXYCODONE HCL 5 MG PO TABS: 10.0000 mg | ORAL_TABLET | ORAL | Status: DC | PRN

## 2022-01-27 MED ORDER — TRIFLURIDINE-TIPIRACIL 20-8.19 MG PO TABS: 60.0000 mg | ORAL_TABLET | Freq: Two times a day (BID) | ORAL | Status: DC

## 2022-01-27 MED ORDER — ACETAMINOPHEN 325 MG PO TABS: 650.0000 mg | ORAL_TABLET | Freq: Four times a day (QID) | ORAL | Status: DC | PRN

## 2022-01-27 MED ORDER — FENTANYL 25 MCG/HR TD PT72
1.0000 | MEDICATED_PATCH | TRANSDERMAL | Status: DC
  Administered 2022-01-27: 1 via TRANSDERMAL
  Filled 2022-01-27: qty 1

## 2022-01-27 MED ORDER — TRIFLURIDINE-TIPIRACIL 20-8.19 MG PO TABS
60.0000 mg | ORAL_TABLET | Freq: Two times a day (BID) | ORAL | Status: DC
  Administered 2022-01-28: 60 mg via ORAL

## 2022-01-27 MED ORDER — APIXABAN 5 MG PO TABS
10.0000 mg | ORAL_TABLET | Freq: Two times a day (BID) | ORAL | Status: DC
  Administered 2022-01-27 – 2022-01-28 (×3): 10 mg via ORAL
  Filled 2022-01-27 (×3): qty 2

## 2022-01-27 MED ORDER — MAGNESIUM SULFATE 2 GM/50ML IV SOLN
2.0000 g | Freq: Once | INTRAVENOUS | Status: AC
  Administered 2022-01-27: 2 g via INTRAVENOUS
  Filled 2022-01-27: qty 50

## 2022-01-27 MED ORDER — PANTOPRAZOLE SODIUM 40 MG PO TBEC
40.0000 mg | DELAYED_RELEASE_TABLET | Freq: Every day | ORAL | Status: DC
  Administered 2022-01-27: 40 mg via ORAL
  Filled 2022-01-27 (×2): qty 1

## 2022-01-27 MED ORDER — SODIUM CHLORIDE 0.9% FLUSH
10.0000 mL | Freq: Two times a day (BID) | INTRAVENOUS | Status: DC
  Administered 2022-01-27: 10 mL

## 2022-01-27 MED ORDER — METOPROLOL SUCCINATE ER 25 MG PO TB24
12.5000 mg | ORAL_TABLET | Freq: Every day | ORAL | Status: DC
  Administered 2022-01-27 – 2022-01-28 (×2): 12.5 mg via ORAL
  Filled 2022-01-27 (×2): qty 1

## 2022-01-27 MED ORDER — SODIUM CHLORIDE 0.9% FLUSH: 10.0000 mL | INTRAVENOUS | Status: DC | PRN

## 2022-01-27 MED ORDER — HEPARIN BOLUS VIA INFUSION
5000.0000 [IU] | Freq: Once | INTRAVENOUS | Status: AC
  Administered 2022-01-27: 5000 [IU] via INTRAVENOUS
  Filled 2022-01-27: qty 5000

## 2022-01-27 MED ORDER — CHLORHEXIDINE GLUCONATE CLOTH 2 % EX PADS
6.0000 | MEDICATED_PAD | Freq: Every day | CUTANEOUS | Status: DC
  Administered 2022-01-27: 6 via TOPICAL

## 2022-01-27 MED ORDER — HEPARIN (PORCINE) 25000 UT/250ML-% IV SOLN
1200.0000 [IU]/h | INTRAVENOUS | Status: DC
  Administered 2022-01-27: 1200 [IU]/h via INTRAVENOUS
  Filled 2022-01-27: qty 250

## 2022-01-27 MED ORDER — APIXABAN 5 MG PO TABS: 5.0000 mg | ORAL_TABLET | Freq: Two times a day (BID) | ORAL | Status: DC

## 2022-01-27 MED ORDER — ONDANSETRON HCL 4 MG PO TABS: 4.0000 mg | ORAL_TABLET | Freq: Four times a day (QID) | ORAL | Status: DC | PRN

## 2022-01-27 MED ORDER — ONDANSETRON HCL 4 MG/2ML IJ SOLN: 4.0000 mg | Freq: Four times a day (QID) | INTRAMUSCULAR | Status: DC | PRN

## 2022-01-27 NOTE — Procedures (Signed)
PROCEDURE SUMMARY:  Successful US guided right thoracentesis. Yielded 1.2L of red tinged pleural fluid. Pt tolerated procedure well. No immediate complications.  Specimen was sent for labs. CXR ordered.  EBL < 5 mL  Pedrohenrique Mcconville PA-C 01/27/2022 10:07 AM

## 2022-01-27 NOTE — Assessment & Plan Note (Addendum)
Initially placed on IV heparin drip.  Converted over to Eliquis and given 30-day free card.  Always need to weigh benefits and risks of blood thinners.  With brain metastases, the patient has a higher risk of bleeding.

## 2022-01-27 NOTE — Assessment & Plan Note (Addendum)
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  Patient is a DO NOT RESUSCITATE.  Overall prognosis is poor.  Follow-up with oncology as outpatient.  Can convert over to hospice if his status declines.  Follow-up with palliative care as outpatient.

## 2022-01-27 NOTE — ED Notes (Signed)
Dr Earleen Newport at bedside to see pt.

## 2022-01-27 NOTE — ED Notes (Signed)
Pt has new diet order. Provided water and applesauce to family member to give to pt. Pt resting in bed in NAD. Alert and oriented.

## 2022-01-27 NOTE — Progress Notes (Signed)
[place] for transitioning from heparin to apixaban Indication: pulmonary embolus  No Known Allergies  Patient Measurements: Height: '5\' 11"'$  (180.3 cm) Weight: 72.6 kg (160 lb) IBW/kg (Calculated) : 75.3  Vital Signs: Temp: 97.6 F (36.4 C) (07/23 1103) Temp Source: Oral (07/23 1103) BP: 138/84 (07/23 1103) Pulse Rate: 78 (07/23 1103)  Labs: Recent Labs    01/26/22 2046 01/26/22 2334 01/27/22 0400 01/27/22 1111  HGB 11.3*  --  10.4*  --   HCT 36.8*  --  34.1*  --   PLT 272  --  224  --   APTT  --   --  177*  --   LABPROT  --   --  17.2*  --   INR  --   --  1.4*  --   HEPARINUNFRC  --   --   --  <0.10*  CREATININE 0.82  --   --   --   TROPONINIHS 36* 39*  --   --      Estimated Creatinine Clearance: 91 mL/min (by C-G formula based on SCr of 0.82 mg/dL).   Medical History: Past Medical History:  Diagnosis Date   Esophageal adenocarcinoma ([place]) 06/23/2020   Family history of brain cancer    Family history of colon cancer    Family history of melanoma    Family history of stomach cancer    GERD (gastroesophageal reflux disease)    History of kidney stones    Nephrolithiasis    [facility]   Pancreatitis, acute    Personal history of colonic polyps    Dr Nicolasa Ducking   Psoriasis     Medications:  Medications Prior to Admission  Medication Sig Dispense Refill Last Dose   dexamethasone (DECADRON) 4 MG tablet Take 2 tablets (8 mg total) by mouth daily. Start the day after chemotherapy for 2 days. Take with food. 30 tablet 1    fentaNYL (DURAGESIC) 25 MCG/HR Place 1 patch onto the skin every 3 (three) days. 10 patch 0    lidocaine-prilocaine (EMLA) cream Apply 1 application. topically as needed. Apply small amount to port site at least 1 hour prior to it being accessed, cover with plastic wrap 30 g 3    loperamide (IMODIUM) 2 MG capsule Take 1 capsule (2 mg total) by mouth as needed. Take 2 at diarrhea onset , then 1 every 2hr until  12hrs with no BM. May take 2 every 4hrs at night. If diarrhea recurs repeat. (Patient not taking: Reported on 11/20/2021) 30 capsule 3    ondansetron (ZOFRAN) 8 MG tablet Take 1 tablet (8 mg total) by mouth every 8 (eight) hours as needed for nausea or vomiting. 20 tablet 0    Oxycodone HCl 10 MG TABS Take 1 tablet (10 mg total) by mouth every 4 (four) hours as needed. 120 tablet 0    pantoprazole (PROTONIX) 40 MG tablet TAKE 1 TABLET BY MOUTH TWICE A DAY BEFORE MEALS 60 tablet 11    potassium chloride (KLOR-CON M) 10 MEQ tablet Take 1 tablet (10 mEq total) by mouth 2 (two) times daily. 60 tablet 0    prochlorperazine (COMPAZINE) 10 MG tablet Take 1 tablet (10 mg total) by mouth every 6 (six) hours as needed for nausea or vomiting. (Patient not taking: Reported on 11/20/2021) 30 tablet 0    trifluridine-tipiracil (LONSURF) 20-8.19 MG tablet Take 60 mg of trifluridine by mouth 2 (two) times daily after a meal. Take within 1 hr after AM &  PM meals on days 1-5, 8-12. Repeat every 28 days.      Scheduled:   Chlorhexidine Gluconate Cloth  6 each Topical Daily   fentaNYL  1 patch Transdermal Q72H   pantoprazole  40 mg Oral Daily   trifluridine-tipiracil  60 mg of trifluridine Oral BID PC   Infusions:   heparin 1,200 Units/hr (01/27/22 0238)    Assessment: 67 yo M started on a heparin drip for PE. Hx esophogeal CA with mets-on oral chemo Hgb 11.3  plt 272  INR 1.4   No anticoag PTA per Med Rec  Goal of Therapy:  Monitor platelets by anticoagulation protocol: Yes   Plan:  Stop heparin infusion Start apixaban 10 mg twice daily for 7 days followed by 5 mg twice daily CBC and SCr once weekly unless more frequent monitoring is needed  Dallie Piles 01/27/2022,1:21 PM

## 2022-01-27 NOTE — Progress Notes (Signed)
Patient ambulated around nursing station on unit and remained above 95% on 2L. Pt denies any shortness of breath.

## 2022-01-27 NOTE — ED Notes (Addendum)
Critical value aPTT 177 paged to [facility] floor coverage, no intervention as this was drawn after heparin started, pharm contacted an also aware.

## 2022-01-27 NOTE — ED Notes (Signed)
Attempted 2X draw heparin level due at 0830. IV placed successfully that flushes well but does not give blood. Lab called to come draw.

## 2022-01-27 NOTE — Progress Notes (Signed)
ANTICOAGULATION CONSULT NOTE - Initial Consult  Pharmacy Consult for heparin drip Indication: pulmonary embolus  No Known Allergies  Patient Measurements: Height: '5\' 11"'$  (180.3 cm) Weight: 72.6 kg (160 lb) IBW/kg (Calculated) : 75.3 Heparin Dosing Weight: 72.6 kg  Vital Signs: Temp: 98.5 F (36.9 C) (07/23 0112) Temp Source: Oral (07/23 0112) BP: 163/86 (07/23 0300) Pulse Rate: 91 (07/23 0300)  Labs: Recent Labs    01/26/22 2046 01/26/22 2334  HGB 11.3*  --   HCT 36.8*  --   PLT 272  --   CREATININE 0.82  --   TROPONINIHS 36* 39*    Estimated Creatinine Clearance: 91 mL/min (by C-G formula based on SCr of 0.82 mg/dL).   Medical History: Past Medical History:  Diagnosis Date   Esophageal adenocarcinoma ([place]) 06/23/2020   Family history of brain cancer    Family history of colon cancer    Family history of melanoma    Family history of stomach cancer    GERD (gastroesophageal reflux disease)    History of kidney stones    Nephrolithiasis    [facility]   Pancreatitis, acute    Personal history of colonic polyps    Dr Nicolasa Ducking   Psoriasis     Medications:  (Not in a hospital admission)  Scheduled:   fentaNYL  1 patch Transdermal Q72H   trifluridine-tipiracil  60 mg of trifluridine Oral BID PC   Infusions:   heparin 1,200 Units/hr (01/27/22 0238)    Assessment: 66 yo M to start Heparin drip for PE. Hx esophogeal CA with mets-on oral chemo Hgb 11.3  plt 272  INR pending  aPTT pending No anticoag PTA per Med Rec   Goal of Therapy:  Heparin level 0.3-0.7 units/ml Monitor platelets by anticoagulation protocol: Yes   Plan:  Give 5000 units bolus x 1 Start heparin infusion at 1200 units/hr Check anti-Xa level in 6 hours and daily while on heparin Continue to monitor H&H and platelets  Taffany Heiser A 01/27/2022,3:48 AM

## 2022-01-27 NOTE — Progress Notes (Signed)
  Progress Note   Patient: Cory [name] GLO:[id number] DOB: Sep 28, 1955 DOA: 01/26/2022     0 DOS: the patient was seen and examined on 01/27/2022    Assessment and Plan: * Acute pulmonary embolism ([place]) Patient placed on IV heparin drip.  Will convert over to Eliquis this evening.  Case discussed with Dr. [name] oncology.  Always need to weigh benefits and risks of blood thinners.  With brain metastases, the patient has a higher risk of bleeding.  Pleural effusion on right Patient seen this morning prior to 1.2 L of fluid drawn off from underneath the right lung by interventional radiology team.  Likely secondary to esophageal cancer.  Esophageal adenocarcinoma ([place]) Stage IV esophageal adenocarcinoma metastases to liver, lung, bone and brain.  Patient is a DO NOT RESUSCITATE.  Overall prognosis is poor.  Chronic pain On fentanyl patch and as needed oxycodone  Anemia of chronic disease Hemoglobin 10.4  GERD (gastroesophageal reflux disease) On PPI        Subjective: Patient seen this morning before thoracentesis.  Felt a little bit better after heparin drip was started.  Being treated for PE and pleural effusion.  Physical Exam: Vitals:   01/27/22 0930 01/27/22 1005 01/27/22 1030 01/27/22 1103  BP: (!) 148/90 (!) 145/70 137/88 138/84  Pulse: 97 97 81 78  Resp: (!) '22  20 18  '$ Temp:    97.6 F (36.4 C)  TempSrc:    Oral  SpO2: 97% 97% 97% 99%  Weight:      Height:       Physical Exam HENT:     Head: Normocephalic.     Mouth/Throat:     Pharynx: No oropharyngeal exudate.  Eyes:     General: Lids are normal.     Conjunctiva/sclera: Conjunctivae normal.  Cardiovascular:     Rate and Rhythm: Regular rhythm. Tachycardia present.     Heart sounds: Normal heart sounds, S1 normal and S2 normal.  Pulmonary:     Breath sounds: Examination of the right-middle field reveals decreased breath sounds and rhonchi. Examination of the right-lower field reveals decreased breath  sounds and rhonchi. Decreased breath sounds and rhonchi present. No wheezing or rales.  Abdominal:     Palpations: Abdomen is soft.     Tenderness: There is no abdominal tenderness.  Musculoskeletal:     Right lower leg: No swelling.     Left lower leg: No swelling.  Skin:    General: Skin is warm.     Findings: No rash.  Neurological:     Mental Status: He is alert and oriented to person, place, and time.     Data Reviewed: Creatinine 0.82, total bilirubin 1.4, BNP 401, white blood cell count 11.5, hemoglobin 10.4  Family Communication: Spoke with daughter at the bedside  Disposition: Status is: Inpatient Remains inpatient appropriate because: Being treated for pulmonary embolism and just had a thoracentesis of 1.2 L today.  Planned Discharge Destination: Home    Time spent: 35 minutes  Author: Loletha Grayer, MD 01/27/2022 1:20 PM  For on call review [email or web address].

## 2022-01-27 NOTE — H&P (Signed)
History and Physical    Patient: Cory [name] OMV:[id number] DOB: 1956-06-03 DOA: 01/26/2022 DOS: the patient was seen and examined on 01/27/2022 PCP: Venia Carbon, MD  Patient coming from: Home  Chief Complaint:  Chief Complaint  Patient presents with   Shortness of Breath    HPI: Cory [name] is a 66 y.o. 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  He has had no cough fever or chills and denies chest pain.  Most of the history provided by daughter at the bedside. ED course and data review: Afebrile, pulse 92-107, respirations 22-24 with BP 178/90 on arrival and O2 sat 98% on room air. Labs: WBC 11,800 with hemoglobin 11.3.  Troponin 39 and BNP 4 1.  CMP with minor abnormalities and elevated alk phos of 246 and total bili 1.4.  COVID and flu negative and procalcitonin less than 0.1 CTA chest PE protocol showing the following: IMPRESSION: 1. Small amount of pulmonary embolism involving subsegmental lower lobe branches of the left pulmonary artery, with mild right heart strain. 2. Very large right pleural effusion which is increased in size when compared to the prior study, with associated moderate severity compressive atelectasis throughout the right lung. 3. Small left pleural effusion with mild posterior left lower lobe atelectatic changes. 4. Innumerable, stable solid pulmonary nodules consistent with pulmonary metastasis. 5. Cirrhotic appearance of the liver with mild to moderate severity splenomegaly.  CT head shows the following: IMPRESSION: 1. No gross evidence of acute intracranial hemorrhage although residual contrast material may obscure acute hemorrhage. 2. Multiple extra-axial lesions likely hyperenhancing. These may represent multiple meningiomas  or meningeal metastasis. No significant mass effect or midline shift.  The ED provider spoke with oncologist, Dr. [name] who recommended IV heparin if no evidence of bleeding on CT head.  Patient subsequently started on heparin infusion.  Hospitalist consulted for admission.     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   Family history of brain cancer    Family history of colon cancer    Family history of melanoma    Family history of stomach cancer    GERD (gastroesophageal reflux disease)    History of kidney stones    Nephrolithiasis    [facility]   Pancreatitis, acute    Personal history of colonic polyps    Dr Nicolasa Ducking   Psoriasis    Past Surgical History:  Procedure Laterality Date   COLONOSCOPY     COLONOSCOPY WITH PROPOFOL N/A 03/31/2020   Procedure: COLONOSCOPY WITH PROPOFOL;  Surgeon: Jonathon Bellows, MD;  Location: [facility] ENDOSCOPY;  Service: Gastroenterology;  Laterality: N/A;   ERCP     ESOPHAGOGASTRODUODENOSCOPY (EGD) WITH PROPOFOL N/A 06/19/2020   Procedure: ESOPHAGOGASTRODUODENOSCOPY (EGD) WITH PROPOFOL;  Surgeon: Jonathon Bellows, MD;  Location: [facility] ENDOSCOPY;  Service: Gastroenterology;  Laterality: N/A;   groin surgery     as a child   PORTA CATH INSERTION N/A 06/28/2020   Procedure: PORTA CATH INSERTION;  Surgeon: Algernon Huxley, MD;  Location: [place] [facility];  Service: Cardiovascular;  Laterality: N/A;   SHOULDER SURGERY     UPPER EXTREMITY VENOGRAPHY Right 07/24/2021   Procedure: UPPER EXTREMITY VENOGRAPHY;  Surgeon: Katha Cabal, MD;  Location: [place] [facility];  Service: Cardiovascular;  Laterality: Right;  Social History:  reports that he quit smoking about 33 years ago. His smoking use included cigarettes. He has never used smokeless tobacco. He reports that he does not drink alcohol and does not use drugs.  No Known Allergies  Family History  Problem Relation Age of Onset   Stomach cancer Mother    Diabetes  Brother    Hypertension Brother    Hypertension Father    Prostate cancer Father    Colon cancer Sister    Cancer Other        either cervical or ovarian, d. 68s   Brain cancer Sister     Prior to Admission medications   Medication Sig Start Date End Date Taking? Authorizing Provider  dexamethasone (DECADRON) 4 MG tablet Take 2 tablets (8 mg total) by mouth daily. Start the day after chemotherapy for 2 days. Take with food. 11/20/21   [name], MD  fentaNYL (DURAGESIC) 25 MCG/HR Place 1 patch onto the skin every 3 (three) days. 01/01/22   [name], MD  lidocaine-prilocaine (EMLA) cream Apply 1 application. topically as needed. Apply small amount to port site at least 1 hour prior to it being accessed, cover with plastic wrap 11/20/21   [name], MD  loperamide (IMODIUM) 2 MG capsule Take 1 capsule (2 mg total) by mouth as needed. Take 2 at diarrhea onset , then 1 every 2hr until 12hrs with no BM. May take 2 every 4hrs at night. If diarrhea recurs repeat. Patient not taking: Reported on 11/20/2021 08/24/21   [name], MD  ondansetron (ZOFRAN) 8 MG tablet Take 1 tablet (8 mg total) by mouth every 8 (eight) hours as needed for nausea or vomiting. 01/21/22   Darl Pikes, RPH-CPP  Oxycodone HCl 10 MG TABS Take 1 tablet (10 mg total) by mouth every 4 (four) hours as needed. 11/26/21   [name], MD  pantoprazole (PROTONIX) 40 MG tablet TAKE 1 TABLET BY MOUTH TWICE A DAY BEFORE MEALS 03/27/21   Viviana Simpler I, MD  potassium chloride (KLOR-CON M) 10 MEQ tablet Take 1 tablet (10 mEq total) by mouth 2 (two) times daily. 01/15/22   [name], MD  prochlorperazine (COMPAZINE) 10 MG tablet Take 1 tablet (10 mg total) by mouth every 6 (six) hours as needed for nausea or vomiting. Patient not taking: Reported on 11/20/2021 08/06/21   [name], MD  trifluridine-tipiracil (LONSURF) 20-8.19 MG tablet Take 60 mg of trifluridine by mouth 2 (two) times daily after a meal. Take  within 1 hr after AM & PM meals on days 1-5, 8-12. Repeat every 28 days.    [name], MD    Physical Exam: Vitals:   01/27/22 0100 01/27/22 0112 01/27/22 0200 01/27/22 0230  BP: (!) 161/84  (!) 166/74 136/79  Pulse: 83  100 98  Resp: (!) 22  19 (!) 24  Temp:  98.5 F (36.9 C)    TempSrc:  Oral    SpO2: 99%  96% 95%  Weight:      Height:       Physical Exam Vitals and nursing note reviewed.  Constitutional:      General: He is not in acute distress.    Comments: Frail-appearing male, dyspneic  HENT:     Head: Normocephalic and atraumatic.  Cardiovascular:     Rate and Rhythm: Normal rate and regular rhythm.     Heart sounds: Normal heart sounds.  Pulmonary:  Effort: Tachypnea present.     Breath sounds: Decreased breath sounds present.     Comments: Decreased breath sounds right  Abdominal:     Palpations: Abdomen is soft.     Tenderness: There is no abdominal tenderness.  Neurological:     Mental Status: Mental status is at baseline.     Labs on Admission: I have personally reviewed following labs and imaging studies  CBC: Recent Labs  Lab 01/26/22 2046  WBC 11.8*  NEUTROABS 9.4*  HGB 11.3*  HCT 36.8*  MCV 92.0  PLT 975   Basic Metabolic Panel: Recent Labs  Lab 01/26/22 2046  NA 141  K 3.9  CL 113*  CO2 22  GLUCOSE 116*  BUN 8  CREATININE 0.82  CALCIUM 8.8*   GFR: Estimated Creatinine Clearance: 91 mL/min (by C-G formula based on SCr of 0.82 mg/dL). Liver Function Tests: Recent Labs  Lab 01/26/22 2046  AST 28  ALT 13  ALKPHOS 246*  BILITOT 1.4*  PROT 6.1*  ALBUMIN 3.4*   No results for input(s): "LIPASE", "AMYLASE" in the last 168 hours. No results for input(s): "AMMONIA" in the last 168 hours. Coagulation Profile: No results for input(s): "INR", "PROTIME" in the last 168 hours. Cardiac Enzymes: No results for input(s): "CKTOTAL", "CKMB", "CKMBINDEX", "TROPONINI" in the last 168 hours. BNP (last 3 results) No results for  input(s): "PROBNP" in the last 8760 hours. HbA1C: No results for input(s): "HGBA1C" in the last 72 hours. CBG: No results for input(s): "GLUCAP" in the last 168 hours. Lipid Profile: No results for input(s): "CHOL", "HDL", "LDLCALC", "TRIG", "CHOLHDL", "LDLDIRECT" in the last 72 hours. Thyroid Function Tests: No results for input(s): "TSH", "T4TOTAL", "FREET4", "T3FREE", "THYROIDAB" in the last 72 hours. Anemia Panel: No results for input(s): "VITAMINB12", "FOLATE", "FERRITIN", "TIBC", "IRON", "RETICCTPCT" in the last 72 hours. Urine analysis:    Component Value Date/Time   COLORURINE YELLOW (A) 07/05/2020 1457   APPEARANCEUR CLEAR (A) 07/05/2020 1457   LABSPEC 1.010 07/05/2020 1457   PHURINE 6.0 07/05/2020 1457   GLUCOSEU NEGATIVE 07/05/2020 1457   GLUCOSEU NEGATIVE 01/28/2011 1050   HGBUR MODERATE (A) 07/05/2020 1457   BILIRUBINUR NEGATIVE 07/05/2020 1457   KETONESUR NEGATIVE 07/05/2020 1457   PROTEINUR NEGATIVE 07/05/2020 1457   UROBILINOGEN 0.2 01/28/2011 1050   NITRITE NEGATIVE 07/05/2020 1457   LEUKOCYTESUR NEGATIVE 07/05/2020 1457    Radiological Exams on Admission: CT Head Wo Contrast  Result Date: 01/27/2022 CLINICAL DATA:  Brain/CNS neoplasm. Monitor to exclude hemorrhage prior to anticoagulation for pulmonary embolus. Previous CT PE scan of the chest 2 hours ago. EXAM: CT HEAD WITHOUT CONTRAST TECHNIQUE: Contiguous axial images were obtained from the base of the skull through the vertex without intravenous contrast. RADIATION DOSE REDUCTION: This exam was performed according to the departmental dose-optimization program which includes automated exposure control, adjustment of the mA and/or kV according to patient size and/or use of iterative reconstruction technique. COMPARISON:  MRI brain 07/29/2021.  CT head 06/03/2025 FINDINGS: Brain: Residual contrast material in the blood pool limits the evaluation of this examination to assess for intracranial hemorrhage. As  visualized no acute intracranial hemorrhages suggested. Residual contrast material could obscure visualization of small hemorrhage. Homogeneously enhancing extra-axial lesion in the right posterior frontal region measures about 1.3 cm in diameter. Homogeneously enhancing extra-axial lesion in the left anterior frontal convexity measures 1.4 cm diameter. Homogeneously enhancing lesion extra-axial in the left temporal region measures 11 mm diameter.probably another extra-axial lesion in the region of the torcula. These  lesions may be multiple meningiomas or meningeal metastasis. No mass effect or midline shift. Gray-white matter junctions are distinct. Ventricles are not dilated. Vascular: No hyperdense vessel or unexpected calcification. Skull: Calvarium appears intact.  No lucent lesions are identified. Sinuses/Orbits: Paranasal sinuses and mastoid air cells are clear. Other: None. IMPRESSION: 1. No gross evidence of acute intracranial hemorrhage although residual contrast material may obscure acute hemorrhage. 2. Multiple extra-axial lesions likely hyperenhancing. These may represent multiple meningiomas or meningeal metastasis. No significant mass effect or midline shift. Electronically Signed   By: Lucienne Capers M.D.   On: 01/27/2022 01:57   CT Angio Chest PE W and/or Wo Contrast  Result Date: 01/27/2022 CLINICAL DATA:  Shortness of breath which worsens with lying down. EXAM: CT ANGIOGRAPHY CHEST WITH CONTRAST TECHNIQUE: Multidetector CT imaging of the chest was performed using the standard protocol during bolus administration of intravenous contrast. Multiplanar CT image reconstructions and MIPs were obtained to evaluate the vascular anatomy. RADIATION DOSE REDUCTION: This exam was performed according to the departmental dose-optimization program which includes automated exposure control, adjustment of the mA and/or kV according to patient size and/or use of iterative reconstruction technique. CONTRAST:   58m OMNIPAQUE IOHEXOL 350 MG/ML SOLN COMPARISON:  January 11, 2022 FINDINGS: Cardiovascular: A right-sided venous Port-A-Cath is in place. There is moderate severity calcification of the aortic arch, without evidence of aortic aneurysm. A small amount of intraluminal low attenuation is seen involving subsegmental lower lobe branches of the left pulmonary artery. Normal heart size with mild right heart strain (RV/LV ratio of 1.04). No pericardial effusion. Mediastinum/Nodes: There is stable AP window, pretracheal and left hilar lymphadenopathy. Thyroid gland, trachea, and esophagus demonstrate no significant findings. Lungs/Pleura: Moderate severity compressive atelectasis is seen within the right upper lobe, right middle lobe and right lower lobe. Mild posterior left lower lobe atelectatic changes are also seen. Innumerable, stable solid pulmonary nodules are again seen. There is a very large right pleural effusion which is increased in size when compared to the prior study. A small left pleural effusion is also seen. This represents a new finding when compared to the prior study. No pneumothorax is identified. Upper Abdomen: The liver is cirrhotic in appearance, with mild to moderate severity splenomegaly. Musculoskeletal: A chronic deformity is seen involving the proximal right humeral shaft. A chronic compression fracture deformity is also noted at the level of T6. Multilevel degenerative changes seen throughout the thoracic spine. Review of the MIP images confirms the above findings. IMPRESSION: 1. Small amount of pulmonary embolism involving subsegmental lower lobe branches of the left pulmonary artery, with mild right heart strain. 2. Very large right pleural effusion which is increased in size when compared to the prior study, with associated moderate severity compressive atelectasis throughout the right lung. 3. Small left pleural effusion with mild posterior left lower lobe atelectatic changes. 4. Innumerable,  stable solid pulmonary nodules consistent with pulmonary metastasis. 5. Cirrhotic appearance of the liver with mild to moderate severity splenomegaly. Aortic Atherosclerosis (ICD10-I70.0). Electronically Signed   By: TVirgina NorfolkM.D.   On: 01/27/2022 00:51   DG Chest 2 View  Result Date: 01/26/2022 CLINICAL DATA:  Shortness of breath EXAM: CHEST - 2 VIEW COMPARISON:  07/05/2020 FINDINGS: Large right pleural effusion. Left lung is clear. Normal cardiomediastinal contours. Right chest wall Port-A-Cath tip in the mid SVC. IMPRESSION: Large right pleural effusion. Electronically Signed   By: KUlyses JarredM.D.   On: 01/26/2022 21:59     Data Reviewed: Relevant notes  from primary care and specialist visits, past discharge summaries as available in EHR, including Care Everywhere. Prior diagnostic testing as pertinent to current admission diagnoses Updated medications and problem lists for reconciliation ED course, including vitals, labs, imaging, treatment and response to treatment Triage notes, nursing and pharmacy notes and ED provider's notes Notable results as noted in HPI   Assessment and Plan: * Acute pulmonary embolism (HCC) Heparin infusion Supplemental oxygen if needed  Pleural effusion on right IR consult for possible thoracentesis for symptomatic relief Keeping n.p.o.  Esophageal adenocarcinoma [facility]) Consider oncology consult in the a.m. Continue oral chemotherapy        DVT prophylaxis: Heparin infusion  Consults: IR consult for thoracentesis  Advance Care Planning:   Code Status: DNR discussed with patient  Family Communication: Spoke with daughter at bedside.  Disposition Plan: Back to previous home environment  Severity of Illness: The appropriate patient status for this patient is INPATIENT. Inpatient status is judged to be reasonable and necessary in order to provide the required intensity of service to ensure the patient's safety. The patient's  presenting symptoms, physical exam findings, and initial radiographic and laboratory data in the context of their chronic comorbidities is felt to place them at high risk for further clinical deterioration. Furthermore, it is not anticipated that the patient will be medically stable for discharge from the hospital within 2 midnights of admission.   * I certify that at the point of admission it is my clinical judgment that the patient will require inpatient hospital care spanning beyond 2 midnights from the point of admission due to high intensity of service, high risk for further deterioration and high frequency of surveillance required.*  Author: Athena Masse, MD 01/27/2022 3:03 AM  For on call review [email or web address].

## 2022-01-27 NOTE — Assessment & Plan Note (Signed)
On fentanyl patch and as needed oxycodone

## 2022-01-27 NOTE — ED Notes (Signed)
Pt going for ultrasound now. Report sent to IP RN. Lab notified that pt will be in ultrasound for next >1 hour.

## 2022-01-27 NOTE — ED Notes (Signed)
Pt just back from u/s. Pt will go to floor.

## 2022-01-27 NOTE — Consult Note (Signed)
Hematology/Oncology Consult note [facility] [phone] [phone]  Patient Care Team: Venia Carbon, MD as PCP - General [name], RN as Oncology Nurse Navigator [name], MD as Consulting Physician (Hematology and Oncology)   Name of the patient: Cory [name]  [id number]  1955-10-21    Reason for call consult: Metastatic esophageal cancer admitted with right pleural effusion and PE   Requesting physician: Dr. Leslye Peer  Date of visit: 01/27/2022    History of presenting illness-patient is a 67 year old male who sees me as an outpatient for metastatic esophageal cancer with known lung, lymph node, brain, bone metastases.  He is presently on third line Lonsurf.  He was admitted with symptoms of worsening shortness of breath and CT chest showed segmental PE with mild right heart strain.  There was evidence of large right pleural effusion and these are new findings in addition to known pulmonary emboli.  Given that there was concern for chronic occipital hemorrhage in the setting of brain mets in the past repeat CT head was done which did not show any evidence of active hemorrhages.  Patient underwent thoracentesis today and 1.2 L of fluid was drained.  Patient does report improvement in his shortness of breath.  He is presently on 2 L of oxygen saturating at 98%  ECOG PS- 2  Pain scale- 3   Review of systems- Review of Systems  Constitutional:  Positive for malaise/fatigue and weight loss. Negative for chills and fever.  HENT:  Negative for congestion, ear discharge and nosebleeds.   Eyes:  Negative for blurred vision.  Respiratory:  Positive for shortness of breath. Negative for cough, hemoptysis, sputum production and wheezing.   Cardiovascular:  Negative for chest pain, palpitations, orthopnea and claudication.  Gastrointestinal:  Negative for abdominal pain, blood in stool, constipation, diarrhea, heartburn, melena,  nausea and vomiting.  Genitourinary:  Negative for dysuria, flank pain, frequency, hematuria and urgency.  Musculoskeletal:  Negative for back pain, joint pain and myalgias.  Skin:  Negative for rash.  Neurological:  Negative for dizziness, tingling, focal weakness, seizures, weakness and headaches.  Endo/Heme/Allergies:  Does not bruise/bleed easily.  Psychiatric/Behavioral:  Negative for depression and suicidal ideas. The patient does not have insomnia.     No Known Allergies  Patient Active Problem List   Diagnosis Date Noted   Acute pulmonary embolism ([place]) 01/27/2022   Pleural effusion on right 01/27/2022   Pulmonary emboli ([place]) 01/27/2022   Chronic pain 01/27/2022   Anemia of chronic disease 01/27/2022   Lymphedema 07/20/2021   Swelling of arm 07/17/2021   Great toe pain, left 02/06/2021   Advance directive discussed with patient 02/06/2021   Genetic testing 09/07/2020   Family history of colon cancer    Family history of brain cancer    Family history of stomach cancer    Family history of melanoma    Goals of care, counseling/discussion 06/23/2020   Esophageal adenocarcinoma ([place]) 06/23/2020   Esophageal dysphagia 05/26/2020   Erectile dysfunction 09/20/2013   Routine general medical examination at a health care facility 09/13/2011   Psoriasis 09/13/2011   ALLERGIC RHINITIS 04/28/2009   GERD (gastroesophageal reflux disease) 04/28/2009   COLONIC POLYPS, HX OF 04/28/2009     Past Medical History:  Diagnosis Date   Esophageal adenocarcinoma ([place]) 06/23/2020   Family history of brain cancer    Family history of colon cancer    Family history of melanoma    Family history of stomach cancer  GERD (gastroesophageal reflux disease)    History of kidney stones    Nephrolithiasis    [facility]   Pancreatitis, acute    Personal history of colonic polyps    Dr Nicolasa Ducking   Psoriasis      Past Surgical History:  Procedure Laterality Date   COLONOSCOPY      COLONOSCOPY WITH PROPOFOL N/A 03/31/2020   Procedure: COLONOSCOPY WITH PROPOFOL;  Surgeon: Jonathon Bellows, MD;  Location: [facility] ENDOSCOPY;  Service: Gastroenterology;  Laterality: N/A;   ERCP     ESOPHAGOGASTRODUODENOSCOPY (EGD) WITH PROPOFOL N/A 06/19/2020   Procedure: ESOPHAGOGASTRODUODENOSCOPY (EGD) WITH PROPOFOL;  Surgeon: Jonathon Bellows, MD;  Location: [facility] ENDOSCOPY;  Service: Gastroenterology;  Laterality: N/A;   groin surgery     as a child   PORTA CATH INSERTION N/A 06/28/2020   Procedure: PORTA CATH INSERTION;  Surgeon: Algernon Huxley, MD;  Location: [place] CV LAB;  Service: Cardiovascular;  Laterality: N/A;   SHOULDER SURGERY     UPPER EXTREMITY VENOGRAPHY Right 07/24/2021   Procedure: UPPER EXTREMITY VENOGRAPHY;  Surgeon: Katha Cabal, MD;  Location: [place] CV LAB;  Service: Cardiovascular;  Laterality: Right;    Social History   Socioeconomic History   Marital status: Married    Spouse name: Not on file   Number of children: 2   Years of education: Not on file   Highest education level: Not on file  Occupational History   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  Tobacco Use   Smoking status: Former    Types: Cigarettes    Quit date: 07/08/1988    Years since quitting: 33.5   Smokeless tobacco: Never  Vaping Use   Vaping Use: Never used  Substance and Sexual Activity   Alcohol use: No    Comment: recovering alcoholic for 13 years   Drug use: No   Sexual activity: Not Currently    Birth control/protection: Inserts  Other Topics Concern   Not on file  Social History Narrative   Has living will--needs to get notarized   Daughter should be health care POA (wife with dementia)   Would accept resuscitation   Would accept tube feeds depending on the circumstance   Social Determinants of Health   Financial Resource Strain: Not on file  Food Insecurity: Not on file  Transportation Needs: Not on file  Physical Activity: Not on file   Stress: Not on file  Social Connections: Not on file  Intimate Partner Violence: Not on file     Family History  Problem Relation Age of Onset   Stomach cancer Mother    Diabetes Brother    Hypertension Brother    Hypertension Father    Prostate cancer Father    Colon cancer Sister    Cancer Other        either cervical or ovarian, d. 50s   Brain cancer Sister      Current Facility-Administered Medications:    acetaminophen (TYLENOL) tablet 650 mg, 650 mg, Oral, Q6H PRN **OR** acetaminophen (TYLENOL) suppository 650 mg, 650 mg, Rectal, Q6H PRN, Athena Masse, MD   apixaban (ELIQUIS) tablet 10 mg, 10 mg, Oral, BID, 10 mg at 01/27/22 1411 **FOLLOWED BY** [START ON 02/03/2022] apixaban (ELIQUIS) tablet 5 mg, 5 mg, Oral, BID, Dallie Piles, RPH   Chlorhexidine Gluconate Cloth 2 % PADS 6 each, 6 each, Topical, Daily, Wieting, Richard, MD   fentaNYL (DURAGESIC) 25 MCG/HR 1 patch, 1 patch, Transdermal, Q72H, Damita Dunnings,  Waldemar Dickens, MD   magnesium sulfate IVPB 2 g 50 mL, 2 g, Intravenous, Once, Wieting, Richard, MD, Last Rate: 50 mL/hr at 01/27/22 1503, 2 g at 01/27/22 1503   metoprolol succinate (TOPROL-XL) 24 hr tablet 12.5 mg, 12.5 mg, Oral, Daily, Wieting, Richard, MD, 12.5 mg at 01/27/22 1500   ondansetron (ZOFRAN) tablet 4 mg, 4 mg, Oral, Q6H PRN **OR** ondansetron (ZOFRAN) injection 4 mg, 4 mg, Intravenous, Q6H PRN, Athena Masse, MD   oxyCODONE (Oxy IR/ROXICODONE) immediate release tablet 10 mg, 10 mg, Oral, Q4H PRN, Athena Masse, MD   pantoprazole (PROTONIX) EC tablet 40 mg, 40 mg, Oral, Daily, Wieting, Richard, MD, 40 mg at 01/27/22 1411   trifluridine-tipiracil (LONSURF) 20-8.19 MG tablet 60 mg of trifluridine, 60 mg of trifluridine, Oral, BID PC, Athena Masse, MD   Physical exam:  Vitals:   01/27/22 1005 01/27/22 1030 01/27/22 1103 01/27/22 1429  BP: (!) 145/70 137/88 138/84 (!) 144/82  Pulse: 97 81 78 80  Resp:  20 18   Temp:   97.6 F (36.4 C) 97.9 F (36.6 C)   TempSrc:   Oral Oral  SpO2: 97% 97% 99%   Weight:      Height:       Physical Exam Constitutional:      General: He is not in acute distress. Cardiovascular:     Rate and Rhythm: Normal rate and regular rhythm.     Heart sounds: Normal heart sounds.  Pulmonary:     Effort: Pulmonary effort is normal.     Comments: Breath sounds decreased over right lung base Abdominal:     General: Bowel sounds are normal.     Palpations: Abdomen is soft.  Musculoskeletal:     Comments: Trace bilateral edema  Skin:    General: Skin is warm and dry.  Neurological:     Mental Status: He is alert and oriented to person, place, and time.           Latest Ref Rng & Units 01/26/2022    8:46 PM  CMP  Glucose 70 - 99 mg/dL 116   BUN 8 - 23 mg/dL 8   Creatinine 0.61 - 1.24 mg/dL 0.82   Sodium 135 - 145 mmol/L 141   Potassium 3.5 - 5.1 mmol/L 3.9   Chloride 98 - 111 mmol/L 113   CO2 22 - 32 mmol/L 22   Calcium 8.9 - 10.3 mg/dL 8.8   Total Protein 6.5 - 8.1 g/dL 6.1   Total Bilirubin 0.3 - 1.2 mg/dL 1.4   Alkaline Phos 38 - 126 U/L 246   AST 15 - 41 U/L 28   ALT 0 - 44 U/L 13       Latest Ref Rng & Units 01/27/2022    4:00 AM  CBC  WBC 4.0 - 10.5 K/uL 11.5   Hemoglobin 13.0 - 17.0 g/dL 10.4   Hematocrit 39.0 - 52.0 % 34.1   Platelets 150 - 400 K/uL 224     '@IMAGES'$ @  DG Chest Port 1 View  Result Date: 01/27/2022 CLINICAL DATA:  Status post thoracentesis. EXAM: PORTABLE CHEST 1 VIEW COMPARISON:  01/26/2022 and older studies. FINDINGS: Interval decrease in the right lower lung zone opacity, although significant opacity persist extending to the mid hemithorax obscuring the right heart border and hemidiaphragm. Mild opacity at the left lung base consistent with a small effusion and atelectasis, similar to the previous day's exam. No pneumothorax. Stable right anterior chest wall Port-A-Cath. IMPRESSION: 1.  Status post right-sided thoracentesis. There has been a reduction in right pleural  fluid although significant opacity persists in the right mid to lower lung. 2. No pneumothorax. Electronically Signed   By: Lajean Manes M.D.   On: 01/27/2022 10:56   US Venous Img Lower Bilateral (DVT)  Result Date: 01/27/2022 CLINICAL DATA:  Recent pulmonary embolism.  Assess for DVT. EXAM: BILATERAL LOWER EXTREMITY VENOUS DOPPLER ULTRASOUND TECHNIQUE: Gray-scale sonography with compression, as well as color and duplex ultrasound, were performed to evaluate the deep venous system(s) from the level of the common femoral vein through the popliteal and proximal calf veins. COMPARISON:  None Available. FINDINGS: VENOUS Normal compressibility of the common femoral, superficial femoral, and popliteal veins, as well as the visualized calf veins. Visualized portions of profunda femoral vein and great saphenous vein unremarkable. No filling defects to suggest DVT on grayscale or color Doppler imaging. Doppler waveforms show normal direction of venous flow, normal respiratory plasticity and response to augmentation. Limited views of the contralateral common femoral vein are unremarkable. OTHER None. Limitations: none IMPRESSION: Negative. Electronically Signed   By: Kerby Moors M.D.   On: 01/27/2022 10:40   CT Head Wo Contrast  Result Date: 01/27/2022 CLINICAL DATA:  Brain/CNS neoplasm. Monitor to exclude hemorrhage prior to anticoagulation for pulmonary embolus. Previous CT PE scan of the chest 2 hours ago. EXAM: CT HEAD WITHOUT CONTRAST TECHNIQUE: Contiguous axial images were obtained from the base of the skull through the vertex without intravenous contrast. RADIATION DOSE REDUCTION: This exam was performed according to the departmental dose-optimization program which includes automated exposure control, adjustment of the mA and/or kV according to patient size and/or use of iterative reconstruction technique. COMPARISON:  MRI brain 07/29/2021.  CT head 06/03/2025 FINDINGS: Brain: Residual contrast material in  the blood pool limits the evaluation of this examination to assess for intracranial hemorrhage. As visualized no acute intracranial hemorrhages suggested. Residual contrast material could obscure visualization of small hemorrhage. Homogeneously enhancing extra-axial lesion in the right posterior frontal region measures about 1.3 cm in diameter. Homogeneously enhancing extra-axial lesion in the left anterior frontal convexity measures 1.4 cm diameter. Homogeneously enhancing lesion extra-axial in the left temporal region measures 11 mm diameter.probably another extra-axial lesion in the region of the torcula. These lesions may be multiple meningiomas or meningeal metastasis. No mass effect or midline shift. Gray-white matter junctions are distinct. Ventricles are not dilated. Vascular: No hyperdense vessel or unexpected calcification. Skull: Calvarium appears intact.  No lucent lesions are identified. Sinuses/Orbits: Paranasal sinuses and mastoid air cells are clear. Other: None. IMPRESSION: 1. No gross evidence of acute intracranial hemorrhage although residual contrast material may obscure acute hemorrhage. 2. Multiple extra-axial lesions likely hyperenhancing. These may represent multiple meningiomas or meningeal metastasis. No significant mass effect or midline shift. Electronically Signed   By: Lucienne Capers M.D.   On: 01/27/2022 01:57   CT Angio Chest PE W and/or Wo Contrast  Result Date: 01/27/2022 CLINICAL DATA:  Shortness of breath which worsens with lying down. EXAM: CT ANGIOGRAPHY CHEST WITH CONTRAST TECHNIQUE: Multidetector CT imaging of the chest was performed using the standard protocol during bolus administration of intravenous contrast. Multiplanar CT image reconstructions and MIPs were obtained to evaluate the vascular anatomy. RADIATION DOSE REDUCTION: This exam was performed according to the departmental dose-optimization program which includes automated exposure control, adjustment of the  mA and/or kV according to patient size and/or use of iterative reconstruction technique. CONTRAST:  37m OMNIPAQUE IOHEXOL 350 MG/ML SOLN COMPARISON:  January 11, 2022 FINDINGS: Cardiovascular: A right-sided venous Port-A-Cath is in place. There is moderate severity calcification of the aortic arch, without evidence of aortic aneurysm. A small amount of intraluminal low attenuation is seen involving subsegmental lower lobe branches of the left pulmonary artery. Normal heart size with mild right heart strain (RV/LV ratio of 1.04). No pericardial effusion. Mediastinum/Nodes: There is stable AP window, pretracheal and left hilar lymphadenopathy. Thyroid gland, trachea, and esophagus demonstrate no significant findings. Lungs/Pleura: Moderate severity compressive atelectasis is seen within the right upper lobe, right middle lobe and right lower lobe. Mild posterior left lower lobe atelectatic changes are also seen. Innumerable, stable solid pulmonary nodules are again seen. There is a very large right pleural effusion which is increased in size when compared to the prior study. A small left pleural effusion is also seen. This represents a new finding when compared to the prior study. No pneumothorax is identified. Upper Abdomen: The liver is cirrhotic in appearance, with mild to moderate severity splenomegaly. Musculoskeletal: A chronic deformity is seen involving the proximal right humeral shaft. A chronic compression fracture deformity is also noted at the level of T6. Multilevel degenerative changes seen throughout the thoracic spine. Review of the MIP images confirms the above findings. IMPRESSION: 1. Small amount of pulmonary embolism involving subsegmental lower lobe branches of the left pulmonary artery, with mild right heart strain. 2. Very large right pleural effusion which is increased in size when compared to the prior study, with associated moderate severity compressive atelectasis throughout the right lung. 3.  Small left pleural effusion with mild posterior left lower lobe atelectatic changes. 4. Innumerable, stable solid pulmonary nodules consistent with pulmonary metastasis. 5. Cirrhotic appearance of the liver with mild to moderate severity splenomegaly. Aortic Atherosclerosis (ICD10-I70.0). Electronically Signed   By: Virgina Norfolk M.D.   On: 01/27/2022 00:51   DG Chest 2 View  Result Date: 01/26/2022 CLINICAL DATA:  Shortness of breath EXAM: CHEST - 2 VIEW COMPARISON:  07/05/2020 FINDINGS: Large right pleural effusion. Left lung is clear. Normal cardiomediastinal contours. Right chest wall Port-A-Cath tip in the mid SVC. IMPRESSION: Large right pleural effusion. Electronically Signed   By: Ulyses Jarred M.D.   On: 01/26/2022 21:59   NM Bone Scan Whole Body  Result Date: 01/12/2022 CLINICAL DATA:  Metastatic esophageal cancer EXAM: NUCLEAR MEDICINE WHOLE BODY BONE SCAN TECHNIQUE: Whole body anterior and posterior images were obtained approximately 3 hours after intravenous injection of radiopharmaceutical. RADIOPHARMACEUTICALS:  23.44 mCi Technetium-59mMDP IV COMPARISON:  CT chest abdomen pelvis 01/11/2022, bone scan 10/31/2021, PET CT 05/22/2021 FINDINGS: Widespread skeletal metastatic disease involving the sternum, calvarium, right humerus, bilateral ribs, pelvic bones, spine and left femur. Small focus of activity at the proximal left tibia without change. 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 The overall distribution of activity is grossly similar as compared with the prior bone scan though there may be slightly more intense sternal activity. No definitive new focus of activity is visualized. Physiologic renal and bladder activity. IMPRESSION: Findings consistent with widespread skeletal metastatic disease with overall grossly stable distribution of activity since prior bone scan with exception of slightly more intense  activity involving the sternum. Electronically Signed   By: KDonavan FoilM.D.   On: 01/12/2022 21:54   CT CHEST ABDOMEN PELVIS W CONTRAST  Result Date: 01/11/2022 CLINICAL DATA:  Metastatic esophageal cancer on chemotherapy. Restaging. * Tracking Code: BO * EXAM: CT  CHEST, ABDOMEN, AND PELVIS WITH CONTRAST TECHNIQUE: Multidetector CT imaging of the chest, abdomen and pelvis was performed following the standard protocol during bolus administration of intravenous contrast. RADIATION DOSE REDUCTION: This exam was performed according to the departmental dose-optimization program which includes automated exposure control, adjustment of the mA and/or kV according to patient size and/or use of iterative reconstruction technique. CONTRAST:  152m OMNIPAQUE IOHEXOL 300 MG/ML  SOLN COMPARISON:  10/31/2021 CT chest, abdomen and pelvis. FINDINGS: CT CHEST FINDINGS Cardiovascular: Normal heart size. No significant pericardial effusion/thickening. Left anterior descending and right coronary atherosclerosis. Right internal jugular Port-A-Cath terminates in the lower third of the SVC. Atherosclerotic nonaneurysmal thoracic aorta. Normal caliber pulmonary arteries. No central pulmonary emboli. Mediastinum/Nodes: No discrete thyroid nodules. Small amount of oral contrast throughout the thoracic esophageal lumen. No discrete thoracic esophageal mass. No axillary adenopathy. Mildly enlarged 1.4 cm short axis diameter right paratracheal node (series 2/image 25), increased from 0.8 cm. Newly enlarged 1.4 cm left hilar node (series 2/image 29). No pathologically enlarged right hilar nodes. Lungs/Pleura: No pneumothorax. Moderate dependent right pleural effusion is essentially new. New trace dependent left pleural effusion. Innumerable (> 20) solid pulmonary nodules scattered throughout both lungs, increased. Representative medial right middle lobe 2.7 x 1.6 cm nodule (series 4/image 90), increased from 2.1 x 1.3 cm. Representative  medial left lower lobe 0.9 cm nodule (series 4/image 125), increased from 0.7 cm. Representative 0.5 cm posterior right lower lobe pulmonary nodule (series 4/image 124), not definitely seen on prior CT. Musculoskeletal: Innumerable subtle permeative osseous lesions throughout the thoracic skeleton including sternum, manubrium, thoracic spine, ribs and right humerus, mildly increased. Representative 2.6 cm manubrial lesion (series 6/image 131), increased from 2.3 cm. Representative 1.8 cm T8 vertebral lesion (series 6/image 123), increased from 1.5 cm. 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 Moderate thoracic spondylosis. CT ABDOMEN PELVIS FINDINGS Hepatobiliary: Diffusely irregular liver surface compatible with cirrhosis. Hypodense 0.8 cm posterior right liver lesion is too small to characterize and unchanged (series 2/image 60). No new liver lesions. Normal gallbladder with no radiopaque cholelithiasis. No biliary ductal dilatation. Pancreas: Normal, with no mass or duct dilation. Spleen: Mild to moderate splenomegaly (craniocaudal splenic length 15.0 cm), stable. No splenic mass. Adrenals/Urinary Tract: Normal adrenals. Nonobstructing clustered lower right renal stones, largest 2 mm. No hydronephrosis. No renal masses. Normal bladder. Stomach/Bowel: Normal non-distended stomach. Normal caliber small bowel with no small bowel wall thickening. Normal diminutive appendix. Oral contrast transits to the right colon. Normal large bowel with no diverticulosis, large bowel wall thickening or pericolonic fat stranding. Vascular/Lymphatic: Atherosclerotic nonaneurysmal abdominal aorta. Patent portal, splenic, hepatic and renal veins. No pathologically enlarged lymph nodes in the abdomen or pelvis. Reproductive: Normal size prostate. Other: No pneumoperitoneum, ascites or focal fluid collection. Musculoskeletal: Marked lumbar spondylosis. Lytic 2.9 cm left ischial lesion (series 5/image  94), increased from 2.2 cm. Patchy sclerotic lesions throughout the right ischium is unchanged. No new focal osseous lesions. IMPRESSION: 1. Interval progression of pulmonary metastases, metastatic thoracic adenopathy and widespread osseous metastatic disease, as detailed. 2. New moderate right and trace left pleural effusions. 3. Cirrhosis. Stable mild to moderate splenomegaly. 4. 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 5. Chronic findings include: Coronary atherosclerosis. Nonobstructing right nephrolithiasis. Aortic Atherosclerosis (ICD10-I70.0). Electronically Signed   By: JIlona SorrelM.D.   On: 01/11/2022 19:13    Assessment and plan- Patient is a 66y.o. male with metastatic esophageal adenocarcinoma with known lung, bone, lymph node and brain metastases  admitted for acute hypoxic respiratory failure secondary to new pulmonary embolism and right pleural effusion likely malignant  Pleural effusion: Likely malignant s/p thoracentesis.  Await cytology.  New pulmonary embolism: Although there has been concern for hemorrhage in the occipital areaWhen patient had his previous MRI in November 2022 we did repeat a CT head at this time which does not show any concern for new hemorrhages.  He does have a new segmental PE with mild right heart strain.  Therefore weighing risks versus benefits of anticoagulation I do feel the patient requires to be started on anticoagulation.  He is currently on heparin drip and I would recommend transitioning him to Eliquis prior to discharge.  Metastatic esophageal cancer: On third line Lonsurf which patient will continue upon discharge.  He understands that his overall prognosis is poor and if he does not respond to [facility] which we will asserted with a repeat scan in about 2 months we are looking at hospice.   Visit Diagnosis 1. SOB (shortness of breath)   2. PE (pulmonary thromboembolism) ([place])   3. Pleural effusion     Dr.  Randa Evens, MD, MPH [facility] at [facility] [phone] 01/27/2022

## 2022-01-27 NOTE — Progress Notes (Signed)
Mobility Specialist - Progress Note    01/27/22 1454  Mobility  Activity Ambulated with assistance in hallway;Stood at bedside;Dangled on edge of bed  Level of Assistance Standby assist, set-up cues, supervision of patient - no hands on  Assistive Device None  Distance Ambulated (ft) 10 ft  Activity Response Tolerated well  $Mobility charge 1 Mobility    Pt lying in bed on 2.5L upon arrival, bumped to 3L for OOB activity. Completes bed mobility ModI + extra time. Completes STS SBA and ambulates to bathroom SBA/CGA with no AD. Mild self corrected instability but tolerates well. Returns to bed with needs in reach and bed alarm set.  Merrily Brittle Mobility Specialist 01/27/22, 2:56 PM

## 2022-01-27 NOTE — Assessment & Plan Note (Signed)
On PPI

## 2022-01-27 NOTE — Assessment & Plan Note (Addendum)
Patient seen this morning prior to 1.2 L of fluid drawn off from underneath the right lung by interventional radiology team.  Likely secondary to esophageal cancer.  May end up needing repeat thoracentesis in the future but this has to be ordered by outpatient physician

## 2022-01-27 NOTE — Assessment & Plan Note (Signed)
Hemoglobin 10.4

## 2022-01-28 ENCOUNTER — Inpatient Hospital Stay (HOSPITAL_BASED_OUTPATIENT_CLINIC_OR_DEPARTMENT_OTHER)
Admit: 2022-01-28 | Discharge: 2022-01-28 | Disposition: A | Discharge status: Home / Self Care | Payer: Medicare Other | Attending: Internal Medicine | Admitting: Internal Medicine

## 2022-01-28 ENCOUNTER — Other Ambulatory Visit: Payer: Self-pay | Admitting: *Deleted

## 2022-01-28 DIAGNOSIS — K7460 Unspecified cirrhosis of liver: Secondary | ICD-10-CM

## 2022-01-28 DIAGNOSIS — C159 Malignant neoplasm of esophagus, unspecified: Secondary | ICD-10-CM | POA: Diagnosis not present

## 2022-01-28 DIAGNOSIS — I2699 Other pulmonary embolism without acute cor pulmonale: Secondary | ICD-10-CM | POA: Diagnosis not present

## 2022-01-28 DIAGNOSIS — J90 Pleural effusion, not elsewhere classified: Secondary | ICD-10-CM | POA: Diagnosis present

## 2022-01-28 DIAGNOSIS — D638 Anemia in other chronic diseases classified elsewhere: Secondary | ICD-10-CM

## 2022-01-28 DIAGNOSIS — K2100 Gastro-esophageal reflux disease with esophagitis, without bleeding: Secondary | ICD-10-CM

## 2022-01-28 DIAGNOSIS — I5189 Other ill-defined heart diseases: Secondary | ICD-10-CM

## 2022-01-28 DIAGNOSIS — I2609 Other pulmonary embolism with acute cor pulmonale: Secondary | ICD-10-CM

## 2022-01-28 DIAGNOSIS — I480 Paroxysmal atrial fibrillation: Secondary | ICD-10-CM

## 2022-01-28 LAB — ECHOCARDIOGRAM COMPLETE
AR max vel: 2.56 cm2
AV Area VTI: 3 cm2
AV Area mean vel: 2.61 cm2
AV Mean grad: 3 mmHg
AV Peak grad: 6.5 mmHg
Ao pk vel: 1.27 m/s
Area-P 1/2: 4.54 cm2
Calc EF: 49.3 %
Height: 71 in
S' Lateral: 4.8 cm
Single Plane A2C EF: 48.4 %
Single Plane A4C EF: 43.1 %
Weight: 2560 oz

## 2022-01-28 LAB — PROCALCITONIN: Procalcitonin: 0.15 ng/mL

## 2022-01-28 MED ORDER — METOPROLOL SUCCINATE ER 25 MG PO TB24: 12.5000 mg | ORAL_TABLET | Freq: Every day | ORAL | 0 refills | Status: AC

## 2022-01-28 MED ORDER — PERFLUTREN LIPID MICROSPHERE
1.0000 mL | INTRAVENOUS | Status: AC | PRN
  Administered 2022-01-28: 3 mL via INTRAVENOUS

## 2022-01-28 MED ORDER — FENTANYL 25 MCG/HR TD PT72: 1.0000 | MEDICATED_PATCH | TRANSDERMAL | 0 refills | Status: AC

## 2022-01-28 MED ORDER — APIXABAN 5 MG PO TABS: ORAL_TABLET | ORAL | 0 refills | Status: AC

## 2022-01-28 NOTE — Discharge Summary (Signed)
Physician Discharge Summary   Patient: Cory [name] MRN: [id number] DOB: 1956-06-01  Admit date:     01/26/2022  Discharge date: 01/28/22  Discharge Physician: Loletha Grayer   PCP: Venia Carbon, MD   Recommendations at discharge:   Follow-up oncology Follow-up PCP  Discharge Diagnoses: Principal Problem:   Acute pulmonary embolism ([place]) Active Problems:   Pleural effusion on right   Esophageal adenocarcinoma (HCC)   Chronic pain   Anemia of chronic disease   GERD (gastroesophageal reflux disease)   Pulmonary emboli (HCC)   Pleural effusion Stable cardiac mass Cirrhotic change of the liver Paroxysmal atrial fibrillation    Hospital Course: The patient was brought into the hospital on 01/27/2022 and discharged on 01/28/2022.  Admitted with shortness of breath.  CT scan of the chest showed a small amount of pulmonary embolism involving the subsegmental lower lobe branches left pulmonary artery with mild right heart strain.  Very large pleural effusion which increased in size compared to prior study.  Innumerable stable solid pulmonary nodules consistent with pulmonary metastases and cirrhotic appearance of the liver with mild to moderate severity and splenomegaly.  The patient was started on heparin drip.  The patient was converted over to Eliquis.  With brain metastases always have to weigh benefits and risks of starting anticoagulation.  Case discussed with Dr. [name] oncology.  30-day free Eliquis card given.  Echocardiogram shows an EF of 45 to 50% and a stable left ventricular apical mass measuring 18 to 13 mm.  Blood clot also commented on echocardiogram.  The patient had a thoracentesis of 1.2 L done by interventional radiology team on 01/27/2022.  High risk for reoccurrence with metastatic esophageal carcinoma.  Assessment and Plan: * Acute pulmonary embolism ([place]) Initially placed on IV heparin drip.  Converted over to Eliquis and given 30-day free card.  Always need  to weigh benefits and risks of blood thinners.  With brain metastases, the patient has a higher risk of bleeding.  Pleural effusion on right Patient seen this morning prior to 1.2 L of fluid drawn off from underneath the right lung by interventional radiology team.  Likely secondary to esophageal cancer.  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  Patient is a DO NOT RESUSCITATE.  Overall prognosis is poor.  Follow-up with oncology as outpatient.  Can convert over to hospice if his status declines.  Follow-up with palliative care as outpatient.  Chronic pain On fentanyl patch and as needed oxycodone  Anemia of chronic disease Hemoglobin 10.4  Cardiac mass As per cardiology the cardiac mass is stable from prior studies.  Cirrhosis ([place]) Follow-up as outpatient  Paroxysmal atrial fibrillation [facility]) Atrial fibrillation happened after thoracentesis.  I did give IV magnesium and Toprol. Patient already on blood thinner from admission.  Patient will be on Eliquis upon discharge.  Patient was in normal sinus rhythm upon discharge.  GERD (gastroesophageal reflux disease) On PPI         Consultants: Oncology Procedures performed: Thoracentesis Disposition: Home Diet recommendation:  Regular diet DISCHARGE MEDICATION: Allergies as of 01/28/2022   No Known Allergies      Medication List     STOP taking these medications    dexamethasone 4 MG tablet Commonly known as: DECADRON   loperamide 2 MG capsule Commonly known as: IMODIUM   prochlorperazine 10 MG tablet  Commonly known as: COMPAZINE       TAKE these medications    apixaban 5 MG Tabs tablet Commonly known as: ELIQUIS Two tabs po twice a day for six days then on tab po twice a day afterwards   lidocaine-prilocaine cream Commonly known as:  EMLA Apply 1 application. topically as needed. Apply small amount to port site at least 1 hour prior to it being accessed, cover with plastic wrap   metoprolol succinate 25 MG 24 hr tablet Commonly known as: TOPROL-XL Take 0.5 tablets (12.5 mg total) by mouth daily.   ondansetron 8 MG tablet Commonly known as: ZOFRAN Take 1 tablet (8 mg total) by mouth every 8 (eight) hours as needed for nausea or vomiting.   Oxycodone HCl 10 MG Tabs Take 1 tablet (10 mg total) by mouth every 4 (four) hours as needed.   pantoprazole 40 MG tablet Commonly known as: PROTONIX TAKE 1 TABLET BY MOUTH TWICE A DAY BEFORE MEALS   potassium chloride 10 MEQ tablet Commonly known as: KLOR-CON M Take 1 tablet (10 mEq total) by mouth 2 (two) times daily.   trifluridine-tipiracil 20-8.19 MG tablet Commonly known as: LONSURF Take 60 mg of trifluridine by mouth 2 (two) times daily after a meal. Take within 1 hr after AM & PM meals on days 1-5, 8-12. Repeat every 28 days.        Follow-up Information     Venia Carbon, MD. Go on 02/04/2022.   Specialties: Internal Medicine, Pediatrics Why: 4pm appointment Contact information: [place] [address] [phone]         [name], MD. Go on 02/05/2022.   Specialty: Oncology Why: appointment time 3:00pm Contact information: [place] [address] [phone]                Discharge Exam: Danley Danker Weights   01/26/22 2051  Weight: 72.6 kg   Physical Exam HENT:     Head: Normocephalic.     Mouth/Throat:     Pharynx: No oropharyngeal exudate.  Eyes:     General: Lids are normal.     Conjunctiva/sclera: Conjunctivae normal.  Cardiovascular:     Rate and Rhythm: Normal rate and regular rhythm.     Heart sounds: Normal heart sounds, S1 normal and S2 normal.  Pulmonary:     Breath sounds: Examination of the right-lower field reveals decreased breath sounds and rhonchi. Decreased breath  sounds and rhonchi present. No wheezing or rales.  Abdominal:     Palpations: Abdomen is soft.     Tenderness: There is no abdominal tenderness.  Musculoskeletal:     Right lower leg: No swelling.     Left lower leg: No swelling.  Skin:    General: Skin is warm.     Findings: No rash.  Neurological:     Mental Status: He is alert and oriented to person, place, and time.      Condition at discharge: fair  The results of significant diagnostics from this hospitalization (including imaging, microbiology, ancillary and laboratory) are listed below for reference.   Imaging Studies: ECHOCARDIOGRAM COMPLETE  Result Date: 01/28/2022    ECHOCARDIOGRAM REPORT   Patient Name:   AZRAEL MADDIX Date of Exam: 01/28/2022 Medical Rec #:  [id number]      Height:       71.0 in Accession #:    [id number]     Weight:       160.0 lb  Date of Birth:  1956-06-01     BSA:          1.918 m Patient Age:    66 years       BP:           129/78 mmHg Patient Gender: M              HR:           70 bpm. Exam Location:  [facility] Procedure: 2D Echo, Color Doppler, Cardiac Doppler and Intracardiac            Opacification Agent Indications:     I26.09 Pulmonary Embolus  History:         Patient has prior history of Echocardiogram examinations, most                  recent 11/05/2021. Hx of esphageal adenocarcinoma.  Sonographer:     Charmayne Sheer Referring Phys:  [id number] Athena Masse Diagnosing Phys: [name] MD IMPRESSIONS  1. Left ventricular ejection fraction, by estimation, is 45 to 50%. The left ventricle has mildly decreased function. The left ventricle demonstrates global hypokinesis. The left ventricular internal cavity size was mildly dilated. There is mild left ventricular hypertrophy. Left ventricular diastolic parameters are consistent with Grade I diastolic dysfunction (impaired relaxation).  2. Right ventricular systolic function is normal. The right ventricular size is normal. There is normal pulmonary artery  systolic pressure.  3. Left atrial size was mildly dilated.  4. The mitral valve is normal in structure. No evidence of mitral valve regurgitation. No evidence of mitral stenosis.  5. The aortic valve is normal in structure. Aortic valve regurgitation is not visualized. No aortic stenosis is present.  6. The inferior vena cava is normal in size with greater than 50% respiratory variability, suggesting right atrial pressure of 3 mmHg.  7. Stable LV apical mass measuring 18-13 mm. FINDINGS  Left Ventricle: Left ventricular ejection fraction, by estimation, is 45 to 50%. The left ventricle has mildly decreased function. The left ventricle demonstrates global hypokinesis. Definity contrast agent was given IV to delineate the left ventricular  endocardial borders. The left ventricular internal cavity size was mildly dilated. There is mild left ventricular hypertrophy. Left ventricular diastolic parameters are consistent with Grade I diastolic dysfunction (impaired relaxation). Right Ventricle: The right ventricular size is normal. No increase in right ventricular wall thickness. Right ventricular systolic function is normal. There is normal pulmonary artery systolic pressure. The tricuspid regurgitant velocity is 2.32 m/s, and  with an assumed right atrial pressure of 3 mmHg, the estimated right ventricular systolic pressure is 71.0 mmHg. Left Atrium: Left atrial size was mildly dilated. Right Atrium: Right atrial size was normal in size. Pericardium: There is no evidence of pericardial effusion. Mitral Valve: The mitral valve is normal in structure. No evidence of mitral valve regurgitation. No evidence of mitral valve stenosis. Tricuspid Valve: The tricuspid valve is normal in structure. Tricuspid valve regurgitation is trivial. No evidence of tricuspid stenosis. Aortic Valve: The aortic valve is normal in structure. Aortic valve regurgitation is not visualized. No aortic stenosis is present. Aortic valve mean gradient  measures 3.0 mmHg. Aortic valve peak gradient measures 6.5 mmHg. Aortic valve area, by VTI measures 3.00 cm. Pulmonic Valve: The pulmonic valve was normal in structure. Pulmonic valve regurgitation is not visualized. No evidence of pulmonic stenosis. Aorta: The aortic root is normal in size and structure. Venous: The inferior vena cava is normal in size with greater  than 50% respiratory variability, suggesting right atrial pressure of 3 mmHg. IAS/Shunts: No atrial level shunt detected by color flow Doppler.  LEFT VENTRICLE PLAX 2D LVIDd:         5.30 cm      Diastology LVIDs:         4.80 cm      LV e' medial:    4.24 cm/s LV PW:         1.10 cm      LV E/e' medial:  17.8 LV IVS:        1.10 cm      LV e' lateral:   5.44 cm/s LVOT diam:     2.30 cm      LV E/e' lateral: 13.9 LV SV:         68 LV SV Index:   36 LVOT Area:     4.15 cm  LV Volumes (MOD) LV vol d, MOD A2C: 122.0 ml LV vol d, MOD A4C: 113.0 ml LV vol s, MOD A2C: 63.0 ml LV vol s, MOD A4C: 64.3 ml LV SV MOD A2C:     59.0 ml LV SV MOD A4C:     113.0 ml LV SV MOD BP:      63.2 ml RIGHT VENTRICLE RV Basal diam:  3.10 cm RV Mid diam:    3.60 cm TAPSE (M-mode): 3.0 cm LEFT ATRIUM           Index        RIGHT ATRIUM           Index LA diam:      2.90 cm 1.51 cm/m   RA Area:     13.10 cm LA Vol (A2C): 55.2 ml 28.78 ml/m  RA Volume:   29.20 ml  15.23 ml/m LA Vol (A4C): 27.8 ml 14.50 ml/m  AORTIC VALVE                    PULMONIC VALVE AV Area (Vmax):    2.56 cm     PV Vmax:       0.92 m/s AV Area (Vmean):   2.61 cm     PV Peak grad:  3.4 mmHg AV Area (VTI):     3.00 cm AV Vmax:           127.00 cm/s AV Vmean:          87.000 cm/s AV VTI:            0.227 m AV Peak Grad:      6.5 mmHg AV Mean Grad:      3.0 mmHg LVOT Vmax:         78.30 cm/s LVOT Vmean:        54.600 cm/s LVOT VTI:          0.164 m LVOT/AV VTI ratio: 0.72  AORTA Ao Root diam: 3.60 cm MITRAL VALVE               TRICUSPID VALVE MV Area (PHT): 4.54 cm    TR Peak grad:   21.5 mmHg MV Decel  Time: 167 msec    TR Vmax:        232.00 cm/s MV E velocity: 75.40 cm/s MV A velocity: 96.40 cm/s  SHUNTS MV E/A ratio:  0.78        Systemic VTI:  0.16 m  Systemic Diam: 2.30 cm [name] MD Electronically signed by [name] MD Signature Date/Time: 01/28/2022/12:04:50 PM    Final    DG Chest Port 1 View  Result Date: 01/27/2022 CLINICAL DATA:  Status post thoracentesis. EXAM: PORTABLE CHEST 1 VIEW COMPARISON:  01/26/2022 and older studies. FINDINGS: Interval decrease in the right lower lung zone opacity, although significant opacity persist extending to the mid hemithorax obscuring the right heart border and hemidiaphragm. Mild opacity at the left lung base consistent with a small effusion and atelectasis, similar to the previous day's exam. No pneumothorax. Stable right anterior chest wall Port-A-Cath. IMPRESSION: 1. Status post right-sided thoracentesis. There has been a reduction in right pleural fluid although significant opacity persists in the right mid to lower lung. 2. No pneumothorax. Electronically Signed   By: Lajean Manes M.D.   On: 01/27/2022 10:56   US Venous Img Lower Bilateral (DVT)  Result Date: 01/27/2022 CLINICAL DATA:  Recent pulmonary embolism.  Assess for DVT. EXAM: BILATERAL LOWER EXTREMITY VENOUS DOPPLER ULTRASOUND TECHNIQUE: Gray-scale sonography with compression, as well as color and duplex ultrasound, were performed to evaluate the deep venous system(s) from the level of the common femoral vein through the popliteal and proximal calf veins. COMPARISON:  None Available. FINDINGS: VENOUS Normal compressibility of the common femoral, superficial femoral, and popliteal veins, as well as the visualized calf veins. Visualized portions of profunda femoral vein and great saphenous vein unremarkable. No filling defects to suggest DVT on grayscale or color Doppler imaging. Doppler waveforms show normal direction of venous flow, normal respiratory  plasticity and response to augmentation. Limited views of the contralateral common femoral vein are unremarkable. OTHER None. Limitations: none IMPRESSION: Negative. Electronically Signed   By: Kerby Moors M.D.   On: 01/27/2022 10:40   CT Head Wo Contrast  Result Date: 01/27/2022 CLINICAL DATA:  Brain/CNS neoplasm. Monitor to exclude hemorrhage prior to anticoagulation for pulmonary embolus. Previous CT PE scan of the chest 2 hours ago. EXAM: CT HEAD WITHOUT CONTRAST TECHNIQUE: Contiguous axial images were obtained from the base of the skull through the vertex without intravenous contrast. RADIATION DOSE REDUCTION: This exam was performed according to the departmental dose-optimization program which includes automated exposure control, adjustment of the mA and/or kV according to patient size and/or use of iterative reconstruction technique. COMPARISON:  MRI brain 07/29/2021.  CT head 06/03/2025 FINDINGS: Brain: Residual contrast material in the blood pool limits the evaluation of this examination to assess for intracranial hemorrhage. As visualized no acute intracranial hemorrhages suggested. Residual contrast material could obscure visualization of small hemorrhage. Homogeneously enhancing extra-axial lesion in the right posterior frontal region measures about 1.3 cm in diameter. Homogeneously enhancing extra-axial lesion in the left anterior frontal convexity measures 1.4 cm diameter. Homogeneously enhancing lesion extra-axial in the left temporal region measures 11 mm diameter.probably another extra-axial lesion in the region of the torcula. These lesions may be multiple meningiomas or meningeal metastasis. No mass effect or midline shift. Gray-white matter junctions are distinct. Ventricles are not dilated. Vascular: No hyperdense vessel or unexpected calcification. Skull: Calvarium appears intact.  No lucent lesions are identified. Sinuses/Orbits: Paranasal sinuses and mastoid air cells are clear. Other:  None. IMPRESSION: 1. No gross evidence of acute intracranial hemorrhage although residual contrast material may obscure acute hemorrhage. 2. Multiple extra-axial lesions likely hyperenhancing. These may represent multiple meningiomas or meningeal metastasis. No significant mass effect or midline shift. Electronically Signed   By: Lucienne Capers M.D.   On: 01/27/2022 01:57  CT Angio Chest PE W and/or Wo Contrast  Result Date: 01/27/2022 CLINICAL DATA:  Shortness of breath which worsens with lying down. EXAM: CT ANGIOGRAPHY CHEST WITH CONTRAST TECHNIQUE: Multidetector CT imaging of the chest was performed using the standard protocol during bolus administration of intravenous contrast. Multiplanar CT image reconstructions and MIPs were obtained to evaluate the vascular anatomy. RADIATION DOSE REDUCTION: This exam was performed according to the departmental dose-optimization program which includes automated exposure control, adjustment of the mA and/or kV according to patient size and/or use of iterative reconstruction technique. CONTRAST:  75m OMNIPAQUE IOHEXOL 350 MG/ML SOLN COMPARISON:  January 11, 2022 FINDINGS: Cardiovascular: A right-sided venous Port-A-Cath is in place. There is moderate severity calcification of the aortic arch, without evidence of aortic aneurysm. A small amount of intraluminal low attenuation is seen involving subsegmental lower lobe branches of the left pulmonary artery. Normal heart size with mild right heart strain (RV/LV ratio of 1.04). No pericardial effusion. Mediastinum/Nodes: There is stable AP window, pretracheal and left hilar lymphadenopathy. Thyroid gland, trachea, and esophagus demonstrate no significant findings. Lungs/Pleura: Moderate severity compressive atelectasis is seen within the right upper lobe, right middle lobe and right lower lobe. Mild posterior left lower lobe atelectatic changes are also seen. Innumerable, stable solid pulmonary nodules are again seen. There is  a very large right pleural effusion which is increased in size when compared to the prior study. A small left pleural effusion is also seen. This represents a new finding when compared to the prior study. No pneumothorax is identified. Upper Abdomen: The liver is cirrhotic in appearance, with mild to moderate severity splenomegaly. Musculoskeletal: A chronic deformity is seen involving the proximal right humeral shaft. A chronic compression fracture deformity is also noted at the level of T6. Multilevel degenerative changes seen throughout the thoracic spine. Review of the MIP images confirms the above findings. IMPRESSION: 1. Small amount of pulmonary embolism involving subsegmental lower lobe branches of the left pulmonary artery, with mild right heart strain. 2. Very large right pleural effusion which is increased in size when compared to the prior study, with associated moderate severity compressive atelectasis throughout the right lung. 3. Small left pleural effusion with mild posterior left lower lobe atelectatic changes. 4. Innumerable, stable solid pulmonary nodules consistent with pulmonary metastasis. 5. Cirrhotic appearance of the liver with mild to moderate severity splenomegaly. Aortic Atherosclerosis (ICD10-I70.0). Electronically Signed   By: TVirgina NorfolkM.D.   On: 01/27/2022 00:51   DG Chest 2 View  Result Date: 01/26/2022 CLINICAL DATA:  Shortness of breath EXAM: CHEST - 2 VIEW COMPARISON:  07/05/2020 FINDINGS: Large right pleural effusion. Left lung is clear. Normal cardiomediastinal contours. Right chest wall Port-A-Cath tip in the mid SVC. IMPRESSION: Large right pleural effusion. Electronically Signed   By: KUlyses JarredM.D.   On: 01/26/2022 21:59   NM Bone Scan Whole Body  Result Date: 01/12/2022 CLINICAL DATA:  Metastatic esophageal cancer EXAM: NUCLEAR MEDICINE WHOLE BODY BONE SCAN TECHNIQUE: Whole body anterior and posterior images were obtained approximately 3 hours after  intravenous injection of radiopharmaceutical. RADIOPHARMACEUTICALS:  23.44 mCi Technetium-91mDP IV COMPARISON:  CT chest abdomen pelvis 01/11/2022, bone scan 10/31/2021, PET CT 05/22/2021 FINDINGS: Widespread skeletal metastatic disease involving the sternum, calvarium, right humerus, bilateral ribs, pelvic bones, spine and left femur. Small focus of activity at the proximal left tibia without change. 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 The overall distribution  of activity is grossly similar as compared with the prior bone scan though there may be slightly more intense sternal activity. No definitive new focus of activity is visualized. Physiologic renal and bladder activity. IMPRESSION: Findings consistent with widespread skeletal metastatic disease with overall grossly stable distribution of activity since prior bone scan with exception of slightly more intense activity involving the sternum. Electronically Signed   By: Donavan Foil M.D.   On: 01/12/2022 21:54   CT CHEST ABDOMEN PELVIS W CONTRAST  Result Date: 01/11/2022 CLINICAL DATA:  Metastatic esophageal cancer on chemotherapy. Restaging. * Tracking Code: BO * EXAM: CT CHEST, ABDOMEN, AND PELVIS WITH CONTRAST TECHNIQUE: Multidetector CT imaging of the chest, abdomen and pelvis was performed following the standard protocol during bolus administration of intravenous contrast. RADIATION DOSE REDUCTION: This exam was performed according to the departmental dose-optimization program which includes automated exposure control, adjustment of the mA and/or kV according to patient size and/or use of iterative reconstruction technique. CONTRAST:  13m OMNIPAQUE IOHEXOL 300 MG/ML  SOLN COMPARISON:  10/31/2021 CT chest, abdomen and pelvis. FINDINGS: CT CHEST FINDINGS Cardiovascular: Normal heart size. No significant pericardial effusion/thickening. Left anterior descending and right  coronary atherosclerosis. Right internal jugular Port-A-Cath terminates in the lower third of the SVC. Atherosclerotic nonaneurysmal thoracic aorta. Normal caliber pulmonary arteries. No central pulmonary emboli. Mediastinum/Nodes: No discrete thyroid nodules. Small amount of oral contrast throughout the thoracic esophageal lumen. No discrete thoracic esophageal mass. No axillary adenopathy. Mildly enlarged 1.4 cm short axis diameter right paratracheal node (series 2/image 25), increased from 0.8 cm. Newly enlarged 1.4 cm left hilar node (series 2/image 29). No pathologically enlarged right hilar nodes. Lungs/Pleura: No pneumothorax. Moderate dependent right pleural effusion is essentially new. New trace dependent left pleural effusion. Innumerable (> 20) solid pulmonary nodules scattered throughout both lungs, increased. Representative medial right middle lobe 2.7 x 1.6 cm nodule (series 4/image 90), increased from 2.1 x 1.3 cm. Representative medial left lower lobe 0.9 cm nodule (series 4/image 125), increased from 0.7 cm. Representative 0.5 cm posterior right lower lobe pulmonary nodule (series 4/image 124), not definitely seen on prior CT. Musculoskeletal: Innumerable subtle permeative osseous lesions throughout the thoracic skeleton including sternum, manubrium, thoracic spine, ribs and right humerus, mildly increased. Representative 2.6 cm manubrial lesion (series 6/image 131), increased from 2.3 cm. Representative 1.8 cm T8 vertebral lesion (series 6/image 123), increased from 1.5 cm. 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 Moderate thoracic spondylosis. CT ABDOMEN PELVIS FINDINGS Hepatobiliary: Diffusely irregular liver surface compatible with cirrhosis. Hypodense 0.8 cm posterior right liver lesion is too small to characterize and unchanged (series 2/image 60). No new liver lesions. Normal gallbladder with no radiopaque cholelithiasis. No biliary ductal dilatation.  Pancreas: Normal, with no mass or duct dilation. Spleen: Mild to moderate splenomegaly (craniocaudal splenic length 15.0 cm), stable. No splenic mass. Adrenals/Urinary Tract: Normal adrenals. Nonobstructing clustered lower right renal stones, largest 2 mm. No hydronephrosis. No renal masses. Normal bladder. Stomach/Bowel: Normal non-distended stomach. Normal caliber small bowel with no small bowel wall thickening. Normal diminutive appendix. Oral contrast transits to the right colon. Normal large bowel with no diverticulosis, large bowel wall thickening or pericolonic fat stranding. Vascular/Lymphatic: Atherosclerotic nonaneurysmal abdominal aorta. Patent portal, splenic, hepatic and renal veins. No pathologically enlarged lymph nodes in the abdomen or pelvis. Reproductive: Normal size prostate. Other: No pneumoperitoneum, ascites or focal fluid collection. Musculoskeletal: Marked lumbar spondylosis. Lytic 2.9 cm left ischial lesion (series 5/image 94), increased from 2.2 cm. Patchy sclerotic lesions  throughout the right ischium is unchanged. No new focal osseous lesions. IMPRESSION: 1. Interval progression of pulmonary metastases, metastatic thoracic adenopathy and widespread osseous metastatic disease, as detailed. 2. New moderate right and trace left pleural effusions. 3. Cirrhosis. Stable mild to moderate splenomegaly. 4. 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 5. Chronic findings include: Coronary atherosclerosis. Nonobstructing right nephrolithiasis. Aortic Atherosclerosis (ICD10-I70.0). Electronically Signed   By: Ilona Sorrel M.D.   On: 01/11/2022 19:13    Microbiology: Results for orders placed or performed during the hospital encounter of 01/26/22  Resp Panel by RT-PCR (Flu A&B, Covid) Anterior Nasal Swab     Status: None   Collection Time: 01/26/22 11:34 PM   Specimen: Anterior Nasal Swab  Result Value Ref Range Status   SARS Coronavirus 2 by RT PCR NEGATIVE  NEGATIVE Final    Comment: (NOTE) SARS-CoV-2 target nucleic acids are NOT DETECTED.  The SARS-CoV-2 RNA is generally detectable in upper respiratory specimens during the acute phase of infection. The lowest concentration of SARS-CoV-2 viral copies this assay can detect is 138 copies/mL. A negative result does not preclude SARS-Cov-2 infection and should not be used as the sole basis for treatment or other patient management decisions. A negative result may occur with  improper specimen collection/handling, submission of specimen other than nasopharyngeal swab, presence of viral mutation(s) within the areas targeted by this assay, and inadequate number of viral copies(<138 copies/mL). A negative result must be combined with clinical observations, patient history, and epidemiological information. The expected result is Negative.  Fact Sheet for Patients:  EntrepreneurPulse.com.au  Fact Sheet for Healthcare Providers:  IncredibleEmployment.be  This test is no t yet approved or cleared by the [place] FDA and  has been authorized for detection and/or diagnosis of SARS-CoV-2 by FDA under an Emergency Use Authorization (EUA). This EUA will remain  in effect (meaning this test can be used) for the duration of the COVID-19 declaration under Section 564(b)(1) of the Act, 21 U.S.C.section 360bbb-3(b)(1), unless the authorization is terminated  or revoked sooner.       Influenza A by PCR NEGATIVE NEGATIVE Final   Influenza B by PCR NEGATIVE NEGATIVE Final    Comment: (NOTE) The Xpert Xpress SARS-CoV-2/FLU/RSV plus assay is intended as an aid in the diagnosis of influenza from Nasopharyngeal swab specimens and should not be used as a sole basis for treatment. Nasal washings and aspirates are unacceptable for Xpert Xpress SARS-CoV-2/FLU/RSV testing.  Fact Sheet for Patients: EntrepreneurPulse.com.au  Fact Sheet for Healthcare  Providers: IncredibleEmployment.be  This test is not yet approved or cleared by the [place] FDA and has been authorized for detection and/or diagnosis of SARS-CoV-2 by FDA under an Emergency Use Authorization (EUA). This EUA will remain in effect (meaning this test can be used) for the duration of the COVID-19 declaration under Section 564(b)(1) of the Act, 21 U.S.C. section 360bbb-3(b)(1), unless the authorization is terminated or revoked.  Performed at [facility], [address] [place], [place] [address]   Blood culture (routine x 2)     Status: None (Preliminary result)   Collection Time: 01/26/22 11:34 PM   Specimen: BLOOD  Result Value Ref Range Status   Specimen Description BLOOD LEFT ANTECUBITAL  Final   Special Requests   Final    BOTTLES DRAWN AEROBIC AND ANAEROBIC Blood Culture adequate volume   Culture   Final    NO GROWTH 1 DAY Performed at [facility], [place], [place]  [address]    Report Status PENDING  Incomplete  Blood culture (routine x 2)     Status: None (Preliminary result)   Collection Time: 01/26/22 11:35 PM   Specimen: BLOOD  Result Value Ref Range Status   Specimen Description BLOOD BLOOD LEFT FOREARM  Final   Special Requests   Final    BOTTLES DRAWN AEROBIC AND ANAEROBIC Blood Culture results may not be optimal due to an inadequate volume of blood received in culture bottles   Culture   Final    NO GROWTH 1 DAY Performed at [facility], [address], [place], [place] [address]    Report Status PENDING  Incomplete  Body fluid culture w Gram Stain     Status: None (Preliminary result)   Collection Time: 01/27/22 10:00 AM   Specimen: PATH Cytology Pleural fluid  Result Value Ref Range Status   Specimen Description   Final    PLEURAL Performed at [facility], [address], [place], [place] [address]    Special Requests   Final    NONE Performed at [facility], [address] [place], [place] [address]    Gram Stain   Final    RARE WBC PRESENT, PREDOMINANTLY MONONUCLEAR NO ORGANISMS SEEN    Culture   Final    NO GROWTH < 24 HOURS Performed at [facility], [place] [address], [place], [place] [address]    Report Status PENDING  Incomplete    Labs: CBC: Recent Labs  Lab 01/26/22 2046 01/27/22 0400  WBC 11.8* 11.5*  NEUTROABS 9.4*  --   HGB 11.3* 10.4*  HCT 36.8* 34.1*  MCV 92.0 91.9  PLT 272 295   Basic Metabolic Panel: Recent Labs  Lab 01/26/22 2046  NA 141  K 3.9  CL 113*  CO2 22  GLUCOSE 116*  BUN 8  CREATININE 0.82  CALCIUM 8.8*   Liver Function Tests: Recent Labs  Lab 01/26/22 2046  AST 28  ALT 13  ALKPHOS 246*  BILITOT 1.4*  PROT 6.1*  ALBUMIN 3.4*     Discharge time spent: greater than 30 minutes.  Signed: Loletha Grayer, MD [facility] 01/28/2022

## 2022-01-28 NOTE — Assessment & Plan Note (Signed)
Follow up as outpatient.  

## 2022-01-28 NOTE — Progress Notes (Signed)
Discharge instructions reviewed with patient and daughter at the bedside. Questions and concerns were answered and addressed. Port deaccessed and PIV removed with no complications. Patient [place] home with daughter

## 2022-01-28 NOTE — Assessment & Plan Note (Signed)
As per cardiology the cardiac mass is stable from prior studies.

## 2022-01-28 NOTE — Progress Notes (Signed)
*  PRELIMINARY RESULTS* Echocardiogram 2D Echocardiogram has been performed.  Cory [name] 01/28/2022, 10:52 AM

## 2022-01-28 NOTE — Assessment & Plan Note (Signed)
Atrial fibrillation happened after thoracentesis.  I did give IV magnesium and Toprol. Patient already on blood thinner from admission.  Patient will be on Eliquis upon discharge.  Patient was in normal sinus rhythm upon discharge.

## 2022-01-28 NOTE — Progress Notes (Signed)
   01/28/22 1100  Clinical Encounter Type  Visited With Patient and family together  Visit Type Follow-up  Referral From Chaplain  Consult/Referral To [facility] followed up from on call Chaplain. Chaplain provided compassionate presence and reflective listening as patient and daughter spoke about hospital stay. Chaplain asked about completing AD paperwork but daughter said they already had completed it at the cancer center last year. Chaplain checked chart and AD paperwork was scanned into patient chart Sept. 2022. Patient and family appreciated Chaplain visit.

## 2022-01-28 NOTE — Care Management Obs Status (Signed)
[facility] NOTIFICATION   Patient Details  Name: Cory [name] MRN: [id number] Date of Birth: August 09, 1955   Medicare Observation Status Notification Given:  Yes    Laurena Slimmer, RN 01/28/2022, 10:08 AM

## 2022-01-28 NOTE — Care Management CC44 (Signed)
Condition Code 44 Documentation Completed  Patient Details  Name: Cory [name] MRN: [id number] Date of Birth: 05/20/1956   Condition Code 44 given:  Yes Patient signature on Condition Code 44 notice:  Yes Documentation of 2 MD's agreement:  Yes Code 44 added to claim:  Yes    Laurena Slimmer, RN 01/28/2022, 10:08 AM

## 2022-01-28 NOTE — Care Management Important Message (Signed)
Important Message  Patient Details  Name: KENTAVIUS DETTORE MRN: [id number] Date of Birth: 08-28-1955   Medicare Important Message Given:  Yes     Laurena Slimmer, RN 01/28/2022, 10:06 AM

## 2022-01-28 NOTE — TOC Initial Note (Signed)
Transition of Care [facility]) - Initial/Assessment Note    Patient Details  Name: Cory [name] MRN: [id number] Date of Birth: September 23, 1955  [facility]) CM/SW Contact:    Laurena Slimmer, RN Phone Number: 01/28/2022, 10:03 AM  Clinical Narrative:                  Transition of Care (TOC) Screening Note   Patient Details  Name: Cory [name] Date of Birth: [birth year]   Transition of Care [facility]) CM/SW Contact:    Laurena Slimmer, RN Phone Number: 01/28/2022, 10:03 AM    Transition of Care Department (TOC) has reviewed patient and no TOC needs have been identified at this time. We will continue to monitor patient advancement through interdisciplinary progression rounds. If new patient transition needs arise, please place a TOC consult.          Patient Goals and CMS Choice        Expected Discharge Plan and Services           Expected Discharge Date: 01/28/22                                    Prior Living Arrangements/Services                       Activities of Daily Living Home Assistive Devices/Equipment: None ADL Screening (condition at time of admission) Patient's cognitive ability adequate to safely complete daily activities?: Yes Is the patient deaf or have difficulty hearing?: No Does the patient have difficulty seeing, even when wearing glasses/contacts?: No Does the patient have difficulty concentrating, remembering, or making decisions?: No Patient able to express need for assistance with ADLs?: Yes Does the patient have difficulty dressing or bathing?: No Independently performs ADLs?: Yes (appropriate for developmental age) Does the patient have difficulty walking or climbing stairs?: Yes Weakness of Legs: Both Weakness of Arms/Hands: Right  Permission Sought/Granted                  Emotional Assessment              Admission diagnosis:  SOB (shortness of breath) [R06.02] Pleural effusion [J90] PE  (pulmonary thromboembolism) ([place]) [I26.99] Acute pulmonary embolism ([facility]) [I26.99] Pulmonary emboli ([place]) [I26.99] Patient Active Problem List   Diagnosis Date Noted   Pleural effusion 01/28/2022   Acute pulmonary embolism ([place]) 01/27/2022   Pleural effusion on right 01/27/2022   Pulmonary emboli ([place]) 01/27/2022   Chronic pain 01/27/2022   Anemia of chronic disease 01/27/2022   Lymphedema 07/20/2021   Swelling of arm 07/17/2021   Great toe pain, left 02/06/2021   Advance directive discussed with patient 02/06/2021   Genetic testing 09/07/2020   Family history of colon cancer    Family history of brain cancer    Family history of stomach cancer    Family history of melanoma    Goals of care, counseling/discussion 06/23/2020   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 [place] [address] Phone: [phone] Fax:  [place], [place] [address] Phone: [phone] Fax: [phone]  [facility] [place], [place] [address] [place] [address] Phone: [phone] Fax: [phone]     Social Determinants of Health (SDOH) Interventions    Readmission Risk Interventions     No data to display

## 2022-01-29 ENCOUNTER — Other Ambulatory Visit: Payer: Self-pay | Admitting: *Deleted

## 2022-01-29 ENCOUNTER — Encounter: Payer: Medicare Other | Admitting: Hospice and Palliative Medicine

## 2022-01-29 LAB — CYTOLOGY - NON PAP

## 2022-01-30 ENCOUNTER — Other Ambulatory Visit: Payer: Self-pay | Admitting: *Deleted

## 2022-01-30 DIAGNOSIS — E876 Hypokalemia: Secondary | ICD-10-CM

## 2022-01-30 DIAGNOSIS — C159 Malignant neoplasm of esophagus, unspecified: Secondary | ICD-10-CM

## 2022-01-30 LAB — BODY FLUID CULTURE W GRAM STAIN: Culture: NO GROWTH

## 2022-01-31 ENCOUNTER — Inpatient Hospital Stay (HOSPITAL_BASED_OUTPATIENT_CLINIC_OR_DEPARTMENT_OTHER): Payer: Medicare Other | Admitting: Hospice and Palliative Medicine

## 2022-01-31 ENCOUNTER — Other Ambulatory Visit: Payer: Self-pay

## 2022-01-31 ENCOUNTER — Inpatient Hospital Stay: Payer: Medicare Other

## 2022-01-31 VITALS — BP 128/74 | HR 94 | Temp 97.8°F | Resp 16 | Wt 147.0 lb

## 2022-01-31 DIAGNOSIS — C159 Malignant neoplasm of esophagus, unspecified: Secondary | ICD-10-CM

## 2022-01-31 DIAGNOSIS — Z95828 Presence of other vascular implants and grafts: Secondary | ICD-10-CM

## 2022-01-31 DIAGNOSIS — Z515 Encounter for palliative care: Secondary | ICD-10-CM | POA: Diagnosis not present

## 2022-01-31 LAB — COMPREHENSIVE METABOLIC PANEL
ALT: 11 U/L (ref 0–44)
AST: 29 U/L (ref 15–41)
Albumin: 3.4 g/dL — ABNORMAL LOW (ref 3.5–5.0)
Alkaline Phosphatase: 245 U/L — ABNORMAL HIGH (ref 38–126)
Anion gap: 6 (ref 5–15)
BUN: 12 mg/dL (ref 8–23)
CO2: 21 mmol/L — ABNORMAL LOW (ref 22–32)
Calcium: 8.6 mg/dL — ABNORMAL LOW (ref 8.9–10.3)
Chloride: 111 mmol/L (ref 98–111)
Creatinine, Ser: 0.93 mg/dL (ref 0.61–1.24)
GFR, Estimated: >60 mL/min (ref >60)
Glucose, Bld: 154 mg/dL — ABNORMAL HIGH (ref 70–99)
Potassium: 4.2 mmol/L (ref 3.5–5.1)
Sodium: 138 mmol/L (ref 135–145)
Total Bilirubin: 1.3 mg/dL — ABNORMAL HIGH (ref 0.3–1.2)
Total Protein: 6.3 g/dL — ABNORMAL LOW (ref 6.5–8.1)

## 2022-01-31 LAB — CBC WITH DIFFERENTIAL/PLATELET
Abs Immature Granulocytes: 0.19 10*3/uL — ABNORMAL HIGH (ref 0.00–0.07)
Basophils Absolute: 0.1 10*3/uL (ref 0.0–0.1)
Basophils Relative: 1 %
Eosinophils Absolute: 0.2 10*3/uL (ref 0.0–0.5)
Eosinophils Relative: 2 %
HCT: 35.4 % — ABNORMAL LOW (ref 39.0–52.0)
Hemoglobin: 11.2 g/dL — ABNORMAL LOW (ref 13.0–17.0)
Immature Granulocytes: 2 %
Lymphocytes Relative: 7 %
Lymphs Abs: 0.7 10*3/uL (ref 0.7–4.0)
MCH: 29 pg (ref 26.0–34.0)
MCHC: 31.6 g/dL (ref 30.0–36.0)
MCV: 91.7 fL (ref 80.0–100.0)
Monocytes Absolute: 0.8 10*3/uL (ref 0.1–1.0)
Monocytes Relative: 8 %
Neutro Abs: 8.5 10*3/uL — ABNORMAL HIGH (ref 1.7–7.7)
Neutrophils Relative %: 80 %
Platelets: 248 10*3/uL (ref 150–400)
RBC: 3.86 MIL/uL — ABNORMAL LOW (ref 4.22–5.81)
RDW: 17.9 % — ABNORMAL HIGH (ref 11.5–15.5)
WBC: 10.5 10*3/uL (ref 4.0–10.5)
nRBC: 0 % (ref 0.0–0.2)

## 2022-01-31 LAB — MAGNESIUM: Magnesium: 2.2 mg/dL (ref 1.7–2.4)

## 2022-01-31 MED ORDER — HEPARIN SOD (PORK) LOCK FLUSH 100 UNIT/ML IV SOLN
500.0000 [IU] | Freq: Once | INTRAVENOUS | Status: AC
  Administered 2022-01-31: 500 [IU] via INTRAVENOUS
  Filled 2022-01-31: qty 5

## 2022-01-31 MED ORDER — SODIUM CHLORIDE 0.9% FLUSH
10.0000 mL | Freq: Once | INTRAVENOUS | Status: AC
  Administered 2022-01-31: 10 mL via INTRAVENOUS
  Filled 2022-01-31: qty 10

## 2022-01-31 NOTE — Progress Notes (Signed)
CALCIUM 8.8 (L) 01/26/2022 2046   AST 28 01/26/2022 2046   ALT 13 01/26/2022 2046   ALKPHOS 246 (H) 01/26/2022 2046   BILITOT 1.4 (H) 01/26/2022 2046   PROT 6.1 (L) 01/26/2022 2046   ALBUMIN 3.4 (L) 01/26/2022 2046    RADIOGRAPHIC STUDIES: US THORACENTESIS ASP PLEURAL SPACE W/IMG GUIDE  Result Date: 01/29/2022 INDICATION: Pleural effusion, history esophageal adenocarcinoma, admitted with acute PE EXAM: ULTRASOUND GUIDED RIGHT THORACENTESIS MEDICATIONS: None. COMPLICATIONS: None immediate. PROCEDURE: An ultrasound guided thoracentesis was thoroughly discussed with the patient and questions answered. The benefits, risks, alternatives and complications were also discussed. The patient understands and wishes to proceed with the procedure. Written consent was obtained. Ultrasound was performed to localize and mark an adequate pocket of fluid in the right chest. The area was then prepped and draped in the normal sterile fashion. 1% Lidocaine was used for local anesthesia. Under ultrasound guidance a 19 gauge, 7-cm, Yueh catheter was introduced. Thoracentesis was performed.  The catheter was removed and a dressing applied. FINDINGS: A total of approximately 1.2L of red tinged fluid was removed. Samples were sent to the laboratory as requested by the clinical team. IMPRESSION: Successful ultrasound guided right thoracentesis yielding 1.2L of pleural fluid. Performed and dictated by Pasty Spillers, PA-C Electronically Signed   By: Markus Daft M.D.   On: 01/29/2022 08:18   ECHOCARDIOGRAM COMPLETE  Result Date: 01/28/2022    ECHOCARDIOGRAM REPORT   Patient Name:   Cory [name] Date of Exam: 01/28/2022 Medical Rec #:  [id number]      Height:       71.0 in Accession #:    [id number]     Weight:       160.0 lb Date of Birth:  06-29-1956     BSA:          1.918 m Patient Age:    66 years       BP:           129/78 mmHg Patient Gender: M              HR:           70 bpm. Exam Location:  [facility] Procedure: 2D Echo, Color Doppler, Cardiac Doppler and Intracardiac            Opacification Agent Indications:     I26.09 Pulmonary Embolus  History:         Patient has prior history of Echocardiogram examinations, most                  recent 11/05/2021. Hx of esphageal adenocarcinoma.  Sonographer:     Charmayne Sheer Referring Phys:  [id number] Athena Masse Diagnosing Phys: [name] MD IMPRESSIONS  1. Left ventricular ejection fraction, by estimation, is 45 to 50%. The left ventricle has mildly decreased function. The left ventricle demonstrates global hypokinesis. The left ventricular internal cavity size was mildly dilated. There is mild left ventricular hypertrophy. Left ventricular diastolic parameters are consistent with Grade I diastolic dysfunction (impaired relaxation).  2. Right ventricular systolic function is normal. The right ventricular size is normal. There is normal pulmonary artery systolic pressure.  3. Left atrial size was mildly dilated.  4. The mitral valve is normal in structure. No evidence of mitral valve regurgitation. No evidence of mitral stenosis.  5. The aortic valve is  normal in structure. Aortic valve regurgitation is not visualized. No aortic stenosis is present.  6. The inferior vena cava is normal in  [facility] [facility] Telephone:(336) [phone] Fax:(336) [phone]  Patient Care Team: Venia Carbon, MD as PCP - General [name], RN as Oncology Nurse Navigator [name], MD as Consulting Physician (Hematology and Oncology)   NAME OF PATIENT: Cory [name]  [id number]  [birth year]   DATE OF VISIT: 01/31/22  REASON FOR CONSULT: Cory [name] is a 66 y.o. 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  Patient completed 10 cycles of palliative FOLFIRI chemotherapy.  CT of the chest, abdomen, and pelvis on 01/11/2022 revealed interval progression of pulmonary metastases, metastatic thoracic adenopathy, and widespread osseous metastatic disease.   Patient was hospitalized 01/26/2022 to 01/28/2022 with shortness of breath.  CTA of the chest revealed a subsegmental small PE with mild right heart strain and a very large pleural effusion with innumerable stable solid pulmonary nodules.  Echo showed an EF of 45 to 50% and is stable left apical mass.  Patient underwent right-sided thoracentesis with 1.2 L removed.  Patient was started on Eliquis.  INTERVAL HISTORY: Since discharge home from hospital, patient reports that he is doing well.  He denies significant changes or concerns.  He has had intermittent shortness of breath but it is mild in severity and limited in duration.    Denies any neurologic complaints. Denies recent fevers or illnesses. Denies any easy bleeding or bruising. Reports fair appetite and denies weight loss. Denies chest pain. Denies any nausea, vomiting, constipation, or diarrhea. Denies urinary complaints. Patient offers no further specific complaints today.  SOCIAL HISTORY:     reports that he quit smoking about 33 years ago. His smoking use included cigarettes. He has never used  smokeless tobacco. He reports that he does not drink alcohol and does not use drugs.  Patient is married lives at home with his wife.  Patient has 2 daughters and a son who are involved.  ADVANCE DIRECTIVES:  On file  CODE STATUS: DNR   PAST MEDICAL HISTORY: Past Medical History:  Diagnosis Date   Esophageal adenocarcinoma ([place]) 06/23/2020   Family history of brain cancer    Family history of colon cancer    Family history of melanoma    Family history of stomach cancer    GERD (gastroesophageal reflux disease)    History of kidney stones    Nephrolithiasis    [facility]   Pancreatitis, acute    Personal history of colonic polyps    Dr Nicolasa Ducking   Psoriasis     PAST SURGICAL HISTORY:  Past Surgical History:  Procedure Laterality Date   COLONOSCOPY     COLONOSCOPY WITH PROPOFOL N/A 03/31/2020   Procedure: COLONOSCOPY WITH PROPOFOL;  Surgeon: Jonathon Bellows, MD;  Location: [facility] ENDOSCOPY;  Service: Gastroenterology;  Laterality: N/A;   ERCP     ESOPHAGOGASTRODUODENOSCOPY (EGD) WITH PROPOFOL N/A 06/19/2020   Procedure: ESOPHAGOGASTRODUODENOSCOPY (EGD) WITH PROPOFOL;  Surgeon: Jonathon Bellows, MD;  Location: [facility] ENDOSCOPY;  Service: Gastroenterology;  Laterality: N/A;   groin surgery     as a child   PORTA CATH INSERTION N/A 06/28/2020   Procedure: PORTA CATH INSERTION;  Surgeon: Algernon Huxley, MD;  Location: [place] CV LAB;  Service: Cardiovascular;  Laterality: N/A;   SHOULDER SURGERY     UPPER EXTREMITY VENOGRAPHY Right 07/24/2021   Procedure: UPPER EXTREMITY VENOGRAPHY;  Surgeon: Katha Cabal, MD;  Location: [place] INVASIVE CV  CALCIUM 8.8 (L) 01/26/2022 2046   AST 28 01/26/2022 2046   ALT 13 01/26/2022 2046   ALKPHOS 246 (H) 01/26/2022 2046   BILITOT 1.4 (H) 01/26/2022 2046   PROT 6.1 (L) 01/26/2022 2046   ALBUMIN 3.4 (L) 01/26/2022 2046    RADIOGRAPHIC STUDIES: US THORACENTESIS ASP PLEURAL SPACE W/IMG GUIDE  Result Date: 01/29/2022 INDICATION: Pleural effusion, history esophageal adenocarcinoma, admitted with acute PE EXAM: ULTRASOUND GUIDED RIGHT THORACENTESIS MEDICATIONS: None. COMPLICATIONS: None immediate. PROCEDURE: An ultrasound guided thoracentesis was thoroughly discussed with the patient and questions answered. The benefits, risks, alternatives and complications were also discussed. The patient understands and wishes to proceed with the procedure. Written consent was obtained. Ultrasound was performed to localize and mark an adequate pocket of fluid in the right chest. The area was then prepped and draped in the normal sterile fashion. 1% Lidocaine was used for local anesthesia. Under ultrasound guidance a 19 gauge, 7-cm, Yueh catheter was introduced. Thoracentesis was performed.  The catheter was removed and a dressing applied. FINDINGS: A total of approximately 1.2L of red tinged fluid was removed. Samples were sent to the laboratory as requested by the clinical team. IMPRESSION: Successful ultrasound guided right thoracentesis yielding 1.2L of pleural fluid. Performed and dictated by Pasty Spillers, PA-C Electronically Signed   By: Markus Daft M.D.   On: 01/29/2022 08:18   ECHOCARDIOGRAM COMPLETE  Result Date: 01/28/2022    ECHOCARDIOGRAM REPORT   Patient Name:   Cory [name] Date of Exam: 01/28/2022 Medical Rec #:  [id number]      Height:       71.0 in Accession #:    [id number]     Weight:       160.0 lb Date of Birth:  06-29-1956     BSA:          1.918 m Patient Age:    66 years       BP:           129/78 mmHg Patient Gender: M              HR:           70 bpm. Exam Location:  [facility] Procedure: 2D Echo, Color Doppler, Cardiac Doppler and Intracardiac            Opacification Agent Indications:     I26.09 Pulmonary Embolus  History:         Patient has prior history of Echocardiogram examinations, most                  recent 11/05/2021. Hx of esphageal adenocarcinoma.  Sonographer:     Charmayne Sheer Referring Phys:  [id number] Athena Masse Diagnosing Phys: [name] MD IMPRESSIONS  1. Left ventricular ejection fraction, by estimation, is 45 to 50%. The left ventricle has mildly decreased function. The left ventricle demonstrates global hypokinesis. The left ventricular internal cavity size was mildly dilated. There is mild left ventricular hypertrophy. Left ventricular diastolic parameters are consistent with Grade I diastolic dysfunction (impaired relaxation).  2. Right ventricular systolic function is normal. The right ventricular size is normal. There is normal pulmonary artery systolic pressure.  3. Left atrial size was mildly dilated.  4. The mitral valve is normal in structure. No evidence of mitral valve regurgitation. No evidence of mitral stenosis.  5. The aortic valve is  normal in structure. Aortic valve regurgitation is not visualized. No aortic stenosis is present.  6. The inferior vena cava is normal in  CALCIUM 8.8 (L) 01/26/2022 2046   AST 28 01/26/2022 2046   ALT 13 01/26/2022 2046   ALKPHOS 246 (H) 01/26/2022 2046   BILITOT 1.4 (H) 01/26/2022 2046   PROT 6.1 (L) 01/26/2022 2046   ALBUMIN 3.4 (L) 01/26/2022 2046    RADIOGRAPHIC STUDIES: US THORACENTESIS ASP PLEURAL SPACE W/IMG GUIDE  Result Date: 01/29/2022 INDICATION: Pleural effusion, history esophageal adenocarcinoma, admitted with acute PE EXAM: ULTRASOUND GUIDED RIGHT THORACENTESIS MEDICATIONS: None. COMPLICATIONS: None immediate. PROCEDURE: An ultrasound guided thoracentesis was thoroughly discussed with the patient and questions answered. The benefits, risks, alternatives and complications were also discussed. The patient understands and wishes to proceed with the procedure. Written consent was obtained. Ultrasound was performed to localize and mark an adequate pocket of fluid in the right chest. The area was then prepped and draped in the normal sterile fashion. 1% Lidocaine was used for local anesthesia. Under ultrasound guidance a 19 gauge, 7-cm, Yueh catheter was introduced. Thoracentesis was performed.  The catheter was removed and a dressing applied. FINDINGS: A total of approximately 1.2L of red tinged fluid was removed. Samples were sent to the laboratory as requested by the clinical team. IMPRESSION: Successful ultrasound guided right thoracentesis yielding 1.2L of pleural fluid. Performed and dictated by Pasty Spillers, PA-C Electronically Signed   By: Markus Daft M.D.   On: 01/29/2022 08:18   ECHOCARDIOGRAM COMPLETE  Result Date: 01/28/2022    ECHOCARDIOGRAM REPORT   Patient Name:   Cory [name] Date of Exam: 01/28/2022 Medical Rec #:  [id number]      Height:       71.0 in Accession #:    [id number]     Weight:       160.0 lb Date of Birth:  06-29-1956     BSA:          1.918 m Patient Age:    66 years       BP:           129/78 mmHg Patient Gender: M              HR:           70 bpm. Exam Location:  [facility] Procedure: 2D Echo, Color Doppler, Cardiac Doppler and Intracardiac            Opacification Agent Indications:     I26.09 Pulmonary Embolus  History:         Patient has prior history of Echocardiogram examinations, most                  recent 11/05/2021. Hx of esphageal adenocarcinoma.  Sonographer:     Charmayne Sheer Referring Phys:  [id number] Athena Masse Diagnosing Phys: [name] MD IMPRESSIONS  1. Left ventricular ejection fraction, by estimation, is 45 to 50%. The left ventricle has mildly decreased function. The left ventricle demonstrates global hypokinesis. The left ventricular internal cavity size was mildly dilated. There is mild left ventricular hypertrophy. Left ventricular diastolic parameters are consistent with Grade I diastolic dysfunction (impaired relaxation).  2. Right ventricular systolic function is normal. The right ventricular size is normal. There is normal pulmonary artery systolic pressure.  3. Left atrial size was mildly dilated.  4. The mitral valve is normal in structure. No evidence of mitral valve regurgitation. No evidence of mitral stenosis.  5. The aortic valve is  normal in structure. Aortic valve regurgitation is not visualized. No aortic stenosis is present.  6. The inferior vena cava is normal in  [facility] [place] [facility] Telephone:(336) [phone] Fax:(336) [phone]  Patient Care Team: Venia Carbon, MD as PCP - General [name], RN as Oncology Nurse Navigator [name], MD as Consulting Physician (Hematology and Oncology)   NAME OF PATIENT: Cory [name]  [id number]  [birth year]   DATE OF VISIT: 01/31/22  REASON FOR CONSULT: Cory [name] is a 66 y.o. 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  Patient completed 10 cycles of palliative FOLFIRI chemotherapy.  CT of the chest, abdomen, and pelvis on 01/11/2022 revealed interval progression of pulmonary metastases, metastatic thoracic adenopathy, and widespread osseous metastatic disease.   Patient was hospitalized 01/26/2022 to 01/28/2022 with shortness of breath.  CTA of the chest revealed a subsegmental small PE with mild right heart strain and a very large pleural effusion with innumerable stable solid pulmonary nodules.  Echo showed an EF of 45 to 50% and is stable left apical mass.  Patient underwent right-sided thoracentesis with 1.2 L removed.  Patient was started on Eliquis.  INTERVAL HISTORY: Since discharge home from hospital, patient reports that he is doing well.  He denies significant changes or concerns.  He has had intermittent shortness of breath but it is mild in severity and limited in duration.    Denies any neurologic complaints. Denies recent fevers or illnesses. Denies any easy bleeding or bruising. Reports fair appetite and denies weight loss. Denies chest pain. Denies any nausea, vomiting, constipation, or diarrhea. Denies urinary complaints. Patient offers no further specific complaints today.  SOCIAL HISTORY:     reports that he quit smoking about 33 years ago. His smoking use included cigarettes. He has never used  smokeless tobacco. He reports that he does not drink alcohol and does not use drugs.  Patient is married lives at home with his wife.  Patient has 2 daughters and a son who are involved.  ADVANCE DIRECTIVES:  On file  CODE STATUS: DNR   PAST MEDICAL HISTORY: Past Medical History:  Diagnosis Date   Esophageal adenocarcinoma ([place]) 06/23/2020   Family history of brain cancer    Family history of colon cancer    Family history of melanoma    Family history of stomach cancer    GERD (gastroesophageal reflux disease)    History of kidney stones    Nephrolithiasis    [facility]   Pancreatitis, acute    Personal history of colonic polyps    Dr Nicolasa Ducking   Psoriasis     PAST SURGICAL HISTORY:  Past Surgical History:  Procedure Laterality Date   COLONOSCOPY     COLONOSCOPY WITH PROPOFOL N/A 03/31/2020   Procedure: COLONOSCOPY WITH PROPOFOL;  Surgeon: Jonathon Bellows, MD;  Location: [facility] ENDOSCOPY;  Service: Gastroenterology;  Laterality: N/A;   ERCP     ESOPHAGOGASTRODUODENOSCOPY (EGD) WITH PROPOFOL N/A 06/19/2020   Procedure: ESOPHAGOGASTRODUODENOSCOPY (EGD) WITH PROPOFOL;  Surgeon: Jonathon Bellows, MD;  Location: [facility] ENDOSCOPY;  Service: Gastroenterology;  Laterality: N/A;   groin surgery     as a child   PORTA CATH INSERTION N/A 06/28/2020   Procedure: PORTA CATH INSERTION;  Surgeon: Algernon Huxley, MD;  Location: [place] CV LAB;  Service: Cardiovascular;  Laterality: N/A;   SHOULDER SURGERY     UPPER EXTREMITY VENOGRAPHY Right 07/24/2021   Procedure: UPPER EXTREMITY VENOGRAPHY;  Surgeon: Katha Cabal, MD;  Location: [place] INVASIVE CV  [facility] Telephone:(336) [phone] Fax:(336) [phone]  Patient Care Team: Venia Carbon, MD as PCP - General [name], RN as Oncology Nurse Navigator [name], MD as Consulting Physician (Hematology and Oncology)   NAME OF PATIENT: Cory [name]  [id number]  [birth year]   DATE OF VISIT: 01/31/22  REASON FOR CONSULT: Cory [name] is a 66 y.o. 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  Patient completed 10 cycles of palliative FOLFIRI chemotherapy.  CT of the chest, abdomen, and pelvis on 01/11/2022 revealed interval progression of pulmonary metastases, metastatic thoracic adenopathy, and widespread osseous metastatic disease.   Patient was hospitalized 01/26/2022 to 01/28/2022 with shortness of breath.  CTA of the chest revealed a subsegmental small PE with mild right heart strain and a very large pleural effusion with innumerable stable solid pulmonary nodules.  Echo showed an EF of 45 to 50% and is stable left apical mass.  Patient underwent right-sided thoracentesis with 1.2 L removed.  Patient was started on Eliquis.  INTERVAL HISTORY: Since discharge home from hospital, patient reports that he is doing well.  He denies significant changes or concerns.  He has had intermittent shortness of breath but it is mild in severity and limited in duration.    Denies any neurologic complaints. Denies recent fevers or illnesses. Denies any easy bleeding or bruising. Reports fair appetite and denies weight loss. Denies chest pain. Denies any nausea, vomiting, constipation, or diarrhea. Denies urinary complaints. Patient offers no further specific complaints today.  SOCIAL HISTORY:     reports that he quit smoking about 33 years ago. His smoking use included cigarettes. He has never used  smokeless tobacco. He reports that he does not drink alcohol and does not use drugs.  Patient is married lives at home with his wife.  Patient has 2 daughters and a son who are involved.  ADVANCE DIRECTIVES:  On file  CODE STATUS: DNR   PAST MEDICAL HISTORY: Past Medical History:  Diagnosis Date   Esophageal adenocarcinoma ([place]) 06/23/2020   Family history of brain cancer    Family history of colon cancer    Family history of melanoma    Family history of stomach cancer    GERD (gastroesophageal reflux disease)    History of kidney stones    Nephrolithiasis    [facility]   Pancreatitis, acute    Personal history of colonic polyps    Dr Nicolasa Ducking   Psoriasis     PAST SURGICAL HISTORY:  Past Surgical History:  Procedure Laterality Date   COLONOSCOPY     COLONOSCOPY WITH PROPOFOL N/A 03/31/2020   Procedure: COLONOSCOPY WITH PROPOFOL;  Surgeon: Jonathon Bellows, MD;  Location: [facility] ENDOSCOPY;  Service: Gastroenterology;  Laterality: N/A;   ERCP     ESOPHAGOGASTRODUODENOSCOPY (EGD) WITH PROPOFOL N/A 06/19/2020   Procedure: ESOPHAGOGASTRODUODENOSCOPY (EGD) WITH PROPOFOL;  Surgeon: Jonathon Bellows, MD;  Location: [facility] ENDOSCOPY;  Service: Gastroenterology;  Laterality: N/A;   groin surgery     as a child   PORTA CATH INSERTION N/A 06/28/2020   Procedure: PORTA CATH INSERTION;  Surgeon: Algernon Huxley, MD;  Location: [place] CV LAB;  Service: Cardiovascular;  Laterality: N/A;   SHOULDER SURGERY     UPPER EXTREMITY VENOGRAPHY Right 07/24/2021   Procedure: UPPER EXTREMITY VENOGRAPHY;  Surgeon: Katha Cabal, MD;  Location: [place] INVASIVE CV  [facility] [facility] Telephone:(336) [phone] Fax:(336) [phone]  Patient Care Team: Venia Carbon, MD as PCP - General [name], RN as Oncology Nurse Navigator [name], MD as Consulting Physician (Hematology and Oncology)   NAME OF PATIENT: Cory [name]  [id number]  [birth year]   DATE OF VISIT: 01/31/22  REASON FOR CONSULT: Cory [name] is a 66 y.o. 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  Patient completed 10 cycles of palliative FOLFIRI chemotherapy.  CT of the chest, abdomen, and pelvis on 01/11/2022 revealed interval progression of pulmonary metastases, metastatic thoracic adenopathy, and widespread osseous metastatic disease.   Patient was hospitalized 01/26/2022 to 01/28/2022 with shortness of breath.  CTA of the chest revealed a subsegmental small PE with mild right heart strain and a very large pleural effusion with innumerable stable solid pulmonary nodules.  Echo showed an EF of 45 to 50% and is stable left apical mass.  Patient underwent right-sided thoracentesis with 1.2 L removed.  Patient was started on Eliquis.  INTERVAL HISTORY: Since discharge home from hospital, patient reports that he is doing well.  He denies significant changes or concerns.  He has had intermittent shortness of breath but it is mild in severity and limited in duration.    Denies any neurologic complaints. Denies recent fevers or illnesses. Denies any easy bleeding or bruising. Reports fair appetite and denies weight loss. Denies chest pain. Denies any nausea, vomiting, constipation, or diarrhea. Denies urinary complaints. Patient offers no further specific complaints today.  SOCIAL HISTORY:     reports that he quit smoking about 33 years ago. His smoking use included cigarettes. He has never used  smokeless tobacco. He reports that he does not drink alcohol and does not use drugs.  Patient is married lives at home with his wife.  Patient has 2 daughters and a son who are involved.  ADVANCE DIRECTIVES:  On file  CODE STATUS: DNR   PAST MEDICAL HISTORY: Past Medical History:  Diagnosis Date   Esophageal adenocarcinoma ([place]) 06/23/2020   Family history of brain cancer    Family history of colon cancer    Family history of melanoma    Family history of stomach cancer    GERD (gastroesophageal reflux disease)    History of kidney stones    Nephrolithiasis    [facility]   Pancreatitis, acute    Personal history of colonic polyps    Dr Nicolasa Ducking   Psoriasis     PAST SURGICAL HISTORY:  Past Surgical History:  Procedure Laterality Date   COLONOSCOPY     COLONOSCOPY WITH PROPOFOL N/A 03/31/2020   Procedure: COLONOSCOPY WITH PROPOFOL;  Surgeon: Jonathon Bellows, MD;  Location: [facility] ENDOSCOPY;  Service: Gastroenterology;  Laterality: N/A;   ERCP     ESOPHAGOGASTRODUODENOSCOPY (EGD) WITH PROPOFOL N/A 06/19/2020   Procedure: ESOPHAGOGASTRODUODENOSCOPY (EGD) WITH PROPOFOL;  Surgeon: Jonathon Bellows, MD;  Location: [facility] ENDOSCOPY;  Service: Gastroenterology;  Laterality: N/A;   groin surgery     as a child   PORTA CATH INSERTION N/A 06/28/2020   Procedure: PORTA CATH INSERTION;  Surgeon: Algernon Huxley, MD;  Location: [place] CV LAB;  Service: Cardiovascular;  Laterality: N/A;   SHOULDER SURGERY     UPPER EXTREMITY VENOGRAPHY Right 07/24/2021   Procedure: UPPER EXTREMITY VENOGRAPHY;  Surgeon: Katha Cabal, MD;  Location: [place] IN[facility]AS[facility]  [facility] [place] [facility] Telephone:(336) [phone] Fax:(336) [phone]  Patient Care Team: Venia Carbon, MD as PCP - General [name], RN as Oncology Nurse Navigator [name], MD as Consulting Physician (Hematology and Oncology)   NAME OF PATIENT: Cory [name]  [id number]  [birth year]   DATE OF VISIT: 01/31/22  REASON FOR CONSULT: Cory [name] is a 66 y.o. 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  Patient completed 10 cycles of palliative FOLFIRI chemotherapy.  CT of the chest, abdomen, and pelvis on 01/11/2022 revealed interval progression of pulmonary metastases, metastatic thoracic adenopathy, and widespread osseous metastatic disease.   Patient was hospitalized 01/26/2022 to 01/28/2022 with shortness of breath.  CTA of the chest revealed a subsegmental small PE with mild right heart strain and a very large pleural effusion with innumerable stable solid pulmonary nodules.  Echo showed an EF of 45 to 50% and is stable left apical mass.  Patient underwent right-sided thoracentesis with 1.2 L removed.  Patient was started on Eliquis.  INTERVAL HISTORY: Since discharge home from hospital, patient reports that he is doing well.  He denies significant changes or concerns.  He has had intermittent shortness of breath but it is mild in severity and limited in duration.    Denies any neurologic complaints. Denies recent fevers or illnesses. Denies any easy bleeding or bruising. Reports fair appetite and denies weight loss. Denies chest pain. Denies any nausea, vomiting, constipation, or diarrhea. Denies urinary complaints. Patient offers no further specific complaints today.  SOCIAL HISTORY:     reports that he quit smoking about 33 years ago. His smoking use included cigarettes. He has never used  smokeless tobacco. He reports that he does not drink alcohol and does not use drugs.  Patient is married lives at home with his wife.  Patient has 2 daughters and a son who are involved.  ADVANCE DIRECTIVES:  On file  CODE STATUS: DNR   PAST MEDICAL HISTORY: Past Medical History:  Diagnosis Date   Esophageal adenocarcinoma ([place]) 06/23/2020   Family history of brain cancer    Family history of colon cancer    Family history of melanoma    Family history of stomach cancer    GERD (gastroesophageal reflux disease)    History of kidney stones    Nephrolithiasis    [facility]   Pancreatitis, acute    Personal history of colonic polyps    Dr Nicolasa Ducking   Psoriasis     PAST SURGICAL HISTORY:  Past Surgical History:  Procedure Laterality Date   COLONOSCOPY     COLONOSCOPY WITH PROPOFOL N/A 03/31/2020   Procedure: COLONOSCOPY WITH PROPOFOL;  Surgeon: Jonathon Bellows, MD;  Location: [facility] ENDOSCOPY;  Service: Gastroenterology;  Laterality: N/A;   ERCP     ESOPHAGOGASTRODUODENOSCOPY (EGD) WITH PROPOFOL N/A 06/19/2020   Procedure: ESOPHAGOGASTRODUODENOSCOPY (EGD) WITH PROPOFOL;  Surgeon: Jonathon Bellows, MD;  Location: [facility] ENDOSCOPY;  Service: Gastroenterology;  Laterality: N/A;   groin surgery     as a child   PORTA CATH INSERTION N/A 06/28/2020   Procedure: PORTA CATH INSERTION;  Surgeon: Algernon Huxley, MD;  Location: [place] CV LAB;  Service: Cardiovascular;  Laterality: N/A;   SHOULDER SURGERY     UPPER EXTREMITY VENOGRAPHY Right 07/24/2021   Procedure: UPPER EXTREMITY VENOGRAPHY;  Surgeon: Katha Cabal, MD;  Location: [place] INVASIVE CV  CALCIUM 8.8 (L) 01/26/2022 2046   AST 28 01/26/2022 2046   ALT 13 01/26/2022 2046   ALKPHOS 246 (H) 01/26/2022 2046   BILITOT 1.4 (H) 01/26/2022 2046   PROT 6.1 (L) 01/26/2022 2046   ALBUMIN 3.4 (L) 01/26/2022 2046    RADIOGRAPHIC STUDIES: US THORACENTESIS ASP PLEURAL SPACE W/IMG GUIDE  Result Date: 01/29/2022 INDICATION: Pleural effusion, history esophageal adenocarcinoma, admitted with acute PE EXAM: ULTRASOUND GUIDED RIGHT THORACENTESIS MEDICATIONS: None. COMPLICATIONS: None immediate. PROCEDURE: An ultrasound guided thoracentesis was thoroughly discussed with the patient and questions answered. The benefits, risks, alternatives and complications were also discussed. The patient understands and wishes to proceed with the procedure. Written consent was obtained. Ultrasound was performed to localize and mark an adequate pocket of fluid in the right chest. The area was then prepped and draped in the normal sterile fashion. 1% Lidocaine was used for local anesthesia. Under ultrasound guidance a 19 gauge, 7-cm, Yueh catheter was introduced. Thoracentesis was performed.  The catheter was removed and a dressing applied. FINDINGS: A total of approximately 1.2L of red tinged fluid was removed. Samples were sent to the laboratory as requested by the clinical team. IMPRESSION: Successful ultrasound guided right thoracentesis yielding 1.2L of pleural fluid. Performed and dictated by Pasty Spillers, PA-C Electronically Signed   By: Markus Daft M.D.   On: 01/29/2022 08:18   ECHOCARDIOGRAM COMPLETE  Result Date: 01/28/2022    ECHOCARDIOGRAM REPORT   Patient Name:   Cory [name] Date of Exam: 01/28/2022 Medical Rec #:  [id number]      Height:       71.0 in Accession #:    [id number]     Weight:       160.0 lb Date of Birth:  06-29-1956     BSA:          1.918 m Patient Age:    66 years       BP:           129/78 mmHg Patient Gender: M              HR:           70 bpm. Exam Location:  [facility] Procedure: 2D Echo, Color Doppler, Cardiac Doppler and Intracardiac            Opacification Agent Indications:     I26.09 Pulmonary Embolus  History:         Patient has prior history of Echocardiogram examinations, most                  recent 11/05/2021. Hx of esphageal adenocarcinoma.  Sonographer:     Charmayne Sheer Referring Phys:  [id number] Athena Masse Diagnosing Phys: [name] MD IMPRESSIONS  1. Left ventricular ejection fraction, by estimation, is 45 to 50%. The left ventricle has mildly decreased function. The left ventricle demonstrates global hypokinesis. The left ventricular internal cavity size was mildly dilated. There is mild left ventricular hypertrophy. Left ventricular diastolic parameters are consistent with Grade I diastolic dysfunction (impaired relaxation).  2. Right ventricular systolic function is normal. The right ventricular size is normal. There is normal pulmonary artery systolic pressure.  3. Left atrial size was mildly dilated.  4. The mitral valve is normal in structure. No evidence of mitral valve regurgitation. No evidence of mitral stenosis.  5. The aortic valve is  normal in structure. Aortic valve regurgitation is not visualized. No aortic stenosis is present.  6. The inferior vena cava is normal in

## 2022-01-31 NOTE — Progress Notes (Signed)
Pt returns for follow-up. He was recently discharged from the hospital. He continues to have some intermittent shortness of breath. Also reports that his right arm is sore since hospitalization, and feels it may be related to venipuncture sticks.

## 2022-02-01 ENCOUNTER — Other Ambulatory Visit: Payer: Self-pay | Admitting: *Deleted

## 2022-02-01 LAB — CULTURE, BLOOD (ROUTINE X 2)
Culture: NO GROWTH
Culture: NO GROWTH
Special Requests: ADEQUATE

## 2022-02-01 MED ORDER — OXYCODONE HCL 10 MG PO TABS: 10.0000 mg | ORAL_TABLET | ORAL | 0 refills | Status: AC | PRN

## 2022-02-04 ENCOUNTER — Other Ambulatory Visit: Payer: Self-pay | Admitting: *Deleted

## 2022-02-04 ENCOUNTER — Ambulatory Visit
Admission: RE | Admit: 2022-02-04 | Discharge: 2022-02-04 | Disposition: A | Discharge status: Home / Self Care | Payer: Medicare Other | Source: Ambulatory Visit | Attending: Oncology | Admitting: Oncology

## 2022-02-04 ENCOUNTER — Inpatient Hospital Stay: Payer: Medicare Other | Admitting: Internal Medicine

## 2022-02-04 ENCOUNTER — Other Ambulatory Visit: Payer: Self-pay | Admitting: Radiology

## 2022-02-04 ENCOUNTER — Ambulatory Visit
Admission: RE | Admit: 2022-02-04 | Discharge: 2022-02-04 | Disposition: A | Discharge status: Home / Self Care | Payer: Medicare Other | Source: Ambulatory Visit | Attending: Radiology | Admitting: Radiology

## 2022-02-04 DIAGNOSIS — J90 Pleural effusion, not elsewhere classified: Secondary | ICD-10-CM

## 2022-02-04 DIAGNOSIS — Z9889 Other specified postprocedural states: Secondary | ICD-10-CM | POA: Diagnosis not present

## 2022-02-04 DIAGNOSIS — C159 Malignant neoplasm of esophagus, unspecified: Secondary | ICD-10-CM

## 2022-02-04 NOTE — Procedures (Signed)
PROCEDURE SUMMARY:  Successful US guided right thoracentesis. Yielded 1.4 L of blood tinged fluid. Pt tolerated procedure well. No immediate complications. Remaining pleural effusion is seen post procedure atleast 500 mL  Specimen was not sent for labs. CXR ordered.  EBL < 5 mL  Rockney Ghee 02/04/2022 12:22 PM

## 2022-02-05 ENCOUNTER — Encounter: Payer: Self-pay | Admitting: Oncology

## 2022-02-05 ENCOUNTER — Inpatient Hospital Stay: Payer: Medicare Other | Admitting: Hospice and Palliative Medicine

## 2022-02-05 ENCOUNTER — Inpatient Hospital Stay (HOSPITAL_BASED_OUTPATIENT_CLINIC_OR_DEPARTMENT_OTHER): Payer: Medicare Other | Admitting: Oncology

## 2022-02-05 ENCOUNTER — Inpatient Hospital Stay: Payer: Medicare Other

## 2022-02-05 ENCOUNTER — Inpatient Hospital Stay: Payer: Medicare Other | Admitting: Pharmacist

## 2022-02-05 ENCOUNTER — Inpatient Hospital Stay: Payer: Medicare Other | Attending: Oncology

## 2022-02-05 VITALS — BP 132/79 | HR 42 | Temp 97.5°F | Resp 16 | Wt 150.5 lb

## 2022-02-05 DIAGNOSIS — Z8249 Family history of ischemic heart disease and other diseases of the circulatory system: Secondary | ICD-10-CM | POA: Diagnosis not present

## 2022-02-05 DIAGNOSIS — Z79899 Other long term (current) drug therapy: Secondary | ICD-10-CM | POA: Diagnosis not present

## 2022-02-05 DIAGNOSIS — K219 Gastro-esophageal reflux disease without esophagitis: Secondary | ICD-10-CM | POA: Insufficient documentation

## 2022-02-05 DIAGNOSIS — I6200 Nontraumatic subdural hemorrhage, unspecified: Secondary | ICD-10-CM | POA: Diagnosis not present

## 2022-02-05 DIAGNOSIS — Z7901 Long term (current) use of anticoagulants: Secondary | ICD-10-CM | POA: Diagnosis not present

## 2022-02-05 DIAGNOSIS — Z87442 Personal history of urinary calculi: Secondary | ICD-10-CM | POA: Insufficient documentation

## 2022-02-05 DIAGNOSIS — C159 Malignant neoplasm of esophagus, unspecified: Secondary | ICD-10-CM

## 2022-02-05 DIAGNOSIS — I700 Atherosclerosis of aorta: Secondary | ICD-10-CM | POA: Insufficient documentation

## 2022-02-05 DIAGNOSIS — R161 Splenomegaly, not elsewhere classified: Secondary | ICD-10-CM | POA: Insufficient documentation

## 2022-02-05 DIAGNOSIS — Z8719 Personal history of other diseases of the digestive system: Secondary | ICD-10-CM | POA: Insufficient documentation

## 2022-02-05 DIAGNOSIS — Z833 Family history of diabetes mellitus: Secondary | ICD-10-CM | POA: Diagnosis not present

## 2022-02-05 DIAGNOSIS — Z800 Family history of malignant neoplasm of digestive organs: Secondary | ICD-10-CM | POA: Diagnosis not present

## 2022-02-05 DIAGNOSIS — Z87891 Personal history of nicotine dependence: Secondary | ICD-10-CM | POA: Diagnosis not present

## 2022-02-05 DIAGNOSIS — J910 Malignant pleural effusion: Secondary | ICD-10-CM

## 2022-02-05 DIAGNOSIS — C787 Secondary malignant neoplasm of liver and intrahepatic bile duct: Secondary | ICD-10-CM | POA: Insufficient documentation

## 2022-02-05 DIAGNOSIS — R633 Feeding difficulties, unspecified: Secondary | ICD-10-CM | POA: Insufficient documentation

## 2022-02-05 DIAGNOSIS — R0602 Shortness of breath: Secondary | ICD-10-CM | POA: Diagnosis not present

## 2022-02-05 DIAGNOSIS — Z808 Family history of malignant neoplasm of other organs or systems: Secondary | ICD-10-CM | POA: Diagnosis not present

## 2022-02-05 DIAGNOSIS — Z8042 Family history of malignant neoplasm of prostate: Secondary | ICD-10-CM | POA: Diagnosis not present

## 2022-02-05 DIAGNOSIS — Z7189 Other specified counseling: Secondary | ICD-10-CM | POA: Diagnosis not present

## 2022-02-05 DIAGNOSIS — C155 Malignant neoplasm of lower third of esophagus: Secondary | ICD-10-CM | POA: Insufficient documentation

## 2022-02-05 LAB — COMPREHENSIVE METABOLIC PANEL
ALT: 9 U/L (ref 0–44)
AST: 25 U/L (ref 15–41)
Albumin: 3.2 g/dL — ABNORMAL LOW (ref 3.5–5.0)
Alkaline Phosphatase: 182 U/L — ABNORMAL HIGH (ref 38–126)
Anion gap: 10 (ref 5–15)
BUN: 18 mg/dL (ref 8–23)
CO2: 19 mmol/L — ABNORMAL LOW (ref 22–32)
Calcium: 8.5 mg/dL — ABNORMAL LOW (ref 8.9–10.3)
Chloride: 105 mmol/L (ref 98–111)
Creatinine, Ser: 1.06 mg/dL (ref 0.61–1.24)
GFR, Estimated: >60 mL/min (ref >60)
Glucose, Bld: 156 mg/dL — ABNORMAL HIGH (ref 70–99)
Potassium: 4.5 mmol/L (ref 3.5–5.1)
Sodium: 134 mmol/L — ABNORMAL LOW (ref 135–145)
Total Bilirubin: 1.4 mg/dL — ABNORMAL HIGH (ref 0.3–1.2)
Total Protein: 6.2 g/dL — ABNORMAL LOW (ref 6.5–8.1)

## 2022-02-05 LAB — CBC WITH DIFFERENTIAL/PLATELET
Abs Immature Granulocytes: 0.15 10*3/uL — ABNORMAL HIGH (ref 0.00–0.07)
Basophils Absolute: 0.1 10*3/uL (ref 0.0–0.1)
Basophils Relative: 0 %
Eosinophils Absolute: 0.1 10*3/uL (ref 0.0–0.5)
Eosinophils Relative: 1 %
HCT: 33.1 % — ABNORMAL LOW (ref 39.0–52.0)
Hemoglobin: 10.6 g/dL — ABNORMAL LOW (ref 13.0–17.0)
Immature Granulocytes: 1 %
Lymphocytes Relative: 11 %
Lymphs Abs: 1.3 10*3/uL (ref 0.7–4.0)
MCH: 29 pg (ref 26.0–34.0)
MCHC: 32 g/dL (ref 30.0–36.0)
MCV: 90.7 fL (ref 80.0–100.0)
Monocytes Absolute: 1.1 10*3/uL — ABNORMAL HIGH (ref 0.1–1.0)
Monocytes Relative: 9 %
Neutro Abs: 9.3 10*3/uL — ABNORMAL HIGH (ref 1.7–7.7)
Neutrophils Relative %: 78 %
Platelets: 330 10*3/uL (ref 150–400)
RBC: 3.65 MIL/uL — ABNORMAL LOW (ref 4.22–5.81)
RDW: 18 % — ABNORMAL HIGH (ref 11.5–15.5)
WBC: 12 10*3/uL — ABNORMAL HIGH (ref 4.0–10.5)
nRBC: 0.3 % — ABNORMAL HIGH (ref 0.0–0.2)

## 2022-02-05 MED ORDER — HEPARIN SOD (PORK) LOCK FLUSH 100 UNIT/ML IV SOLN
500.0000 [IU] | Freq: Once | INTRAVENOUS | Status: AC
  Administered 2022-02-05: 500 [IU] via INTRAVENOUS
  Filled 2022-02-05: qty 5

## 2022-02-05 NOTE — Progress Notes (Signed)
[place]  [phone] [phone]  Patient Care Team: Venia Carbon, MD as PCP - General [name], RN as Oncology Nurse Navigator [name], MD as Consulting Physician (Hematology and Oncology)   Name of the patient: Cory [name]  [id number]  1955-11-01   Date of visit: 02/05/22  HPI: Patient is a 66 y.o. male with progressive metastatic esophageal adenocarcinoma. He is currently being treated with Lonsurf (trifluridine/tipiracil).   Reason for Consult: Oral chemotherapy follow-up for Lonsurf therapy.   PAST MEDICAL HISTORY: Past Medical History:  Diagnosis Date   Esophageal adenocarcinoma ([place]) 06/23/2020   Family history of brain cancer    Family history of colon cancer    Family history of melanoma    Family history of stomach cancer    GERD (gastroesophageal reflux disease)    History of kidney stones    Nephrolithiasis    [facility]   Pancreatitis, acute    Personal history of colonic polyps    Dr Nicolasa Ducking   Psoriasis     HEMATOLOGY/ONCOLOGY HISTORY:  Oncology History  Esophageal adenocarcinoma ([place])  06/23/2020 Initial Diagnosis   Esophageal adenocarcinoma ([place])   06/28/2020 Cancer Staging   Staging form: Esophagus - Adenocarcinoma, AJCC 8th Edition - Clinical stage from 06/28/2020: Stage IVB (cTX, cN1, cM1) - Signed by [name], MD on 06/28/2020   07/03/2020 - 07/06/2021 Chemotherapy   Patient is on Treatment Plan : GASTROESOPHAGEAL FOLFOX/Kanjintiq14d x 6 cycles + Keytruda '400mg'$  q6w      02/13/2021 - 02/13/2021 Chemotherapy   Patient is on Treatment Plan : GE junction Trastuzumab q14d     08/06/2021 - 08/06/2021 Chemotherapy   Patient is on Treatment Plan : GASTROESOPHAGEAL fam-trastuzumab deruxtecan-nxki (Enhertu) q21d     08/30/2021 -  Chemotherapy   Patient is on Treatment Plan : GE junction       ALLERGIES:  has No Known Allergies.  MEDICATIONS:  Current Outpatient  Medications  Medication Sig Dispense Refill   apixaban (ELIQUIS) 5 MG TABS tablet Two tabs po twice a day for six days then on tab po twice a day afterwards 70 tablet 0   fentaNYL (DURAGESIC) 25 MCG/HR Place 1 patch onto the skin every 3 (three) days. 10 patch 0   lidocaine-prilocaine (EMLA) cream Apply 1 application. topically as needed. Apply small amount to port site at least 1 hour prior to it being accessed, cover with plastic wrap 30 g 3   metoprolol succinate (TOPROL-XL) 25 MG 24 hr tablet Take 0.5 tablets (12.5 mg total) by mouth daily. 15 tablet 0   ondansetron (ZOFRAN) 8 MG tablet Take 1 tablet (8 mg total) by mouth every 8 (eight) hours as needed for nausea or vomiting. (Patient not taking: Reported on 02/05/2022) 20 tablet 0   Oxycodone HCl 10 MG TABS Take 1 tablet (10 mg total) by mouth every 4 (four) hours as needed. 120 tablet 0   pantoprazole (PROTONIX) 40 MG tablet TAKE 1 TABLET BY MOUTH TWICE A DAY BEFORE MEALS 60 tablet 11   potassium chloride (KLOR-CON M) 10 MEQ tablet Take 1 tablet (10 mEq total) by mouth 2 (two) times daily. 60 tablet 0   trifluridine-tipiracil (LONSURF) 20-8.19 MG tablet Take 60 mg of trifluridine by mouth 2 (two) times daily after a meal. Take within 1 hr after AM & PM meals on days 1-5, 8-12. Repeat every 28 days. (Patient not taking: Reported on 02/05/2022)     No current facility-administered medications  for this visit.    VITAL SIGNS: There were no vitals taken for this visit. There were no vitals filed for this visit.  Estimated body mass index is 20.99 kg/m as calculated from the following:   Height as of 01/26/22: '5\' 11"'$  (1.803 m).   Weight as of an earlier encounter on 02/05/22: 68.3 kg (150 lb 8 oz).  LABS: CBC:    Component Value Date/Time   WBC 12.0 (H) 02/05/2022 1422   HGB 10.6 (L) 02/05/2022 1422   HCT 33.1 (L) 02/05/2022 1422   PLT 330 02/05/2022 1422   MCV 90.7 02/05/2022 1422   NEUTROABS 9.3 (H) 02/05/2022 1422   LYMPHSABS 1.3  02/05/2022 1422   MONOABS 1.1 (H) 02/05/2022 1422   EOSABS 0.1 02/05/2022 1422   BASOSABS 0.1 02/05/2022 1422   Comprehensive Metabolic Panel:    Component Value Date/Time   NA 134 (L) 02/05/2022 1422   K 4.5 02/05/2022 1422   CL 105 02/05/2022 1422   CO2 19 (L) 02/05/2022 1422   BUN 18 02/05/2022 1422   CREATININE 1.06 02/05/2022 1422   GLUCOSE 156 (H) 02/05/2022 1422   CALCIUM 8.5 (L) 02/05/2022 1422   AST 25 02/05/2022 1422   ALT 9 02/05/2022 1422   ALKPHOS 182 (H) 02/05/2022 1422   BILITOT 1.4 (H) 02/05/2022 1422   PROT 6.2 (L) 02/05/2022 1422   ALBUMIN 3.2 (L) 02/05/2022 1422     Present during today's visit: patient and his daughter  Assessment and Plan: Reviewed CBC/CMP, patient lab wise is tolerating the Lonsurf He has had an increase in fatigue and decrease in appetite  After discussion with Dr. [name], patient and his daughter have decided to proceed with hospice care   Medication Access Issues: Called [facility] Patient Assistance to have him inactivate   Patient expressed understanding and was in agreement with this plan. He also understands that He can call clinic at any time with any questions, concerns, or complaints.   Thank you for allowing me to participate in the care of this very pleasant patient.   Time Total: 15 mins  Visit consisted of counseling and education on dealing with issues of symptom management in the setting of serious and potentially life-threatening illness.Greater than 50%  of this time was spent counseling and coordinating care related to the above assessment and plan.  Signed by: Darl Pikes, PharmD, BCPS, Salley Slaughter, CPP Hematology/Oncology Clinical Pharmacist Practitioner [place]/[facility]/AP [facility] [phone]  02/05/2022 4:09 PM

## 2022-02-05 NOTE — Progress Notes (Unsigned)
  Per daughter; pt is not able to feed himself any longer his fine motor skills are declining: not able to hold a glass of water, not able to grip and all new since yesterday. Also, seems very confused at times. Per pt: pts back itches all of time. Also, urine has a brownish color to it; not sure of an odor.

## 2022-02-06 ENCOUNTER — Encounter: Payer: Self-pay | Admitting: Oncology

## 2022-02-06 LAB — CEA: CEA: 162 ng/mL — ABNORMAL HIGH (ref 0.0–4.7)

## 2022-02-06 NOTE — Progress Notes (Signed)
Hematology/Oncology Consult note [facility]  Telephone:([phone] [phone]  Patient Care Team: Venia Carbon, MD as PCP - General [name], RN as Oncology Nurse Navigator [name], MD as Consulting Physician (Hematology and Oncology)   Name of the patient: Cory [name]  [id number]  [birth year]   Date of visit: 02/06/22  Diagnosis- metastatic esophageal cancer with liver metastases    Chief complaint/ Reason for visit-routine follow-up of esophageal cancer and posthospital discharge follow-up  Heme/Onc history: Patient is a 66 year old male with history of metastatic esophageal adenocarcinoma s/p progression on first-line FOLFOX trastuzumab Keytruda chemotherapy.NGS testing showed high tumor mutational burden CPS score less than 1. CD9-NRG1 fusion. BRAF 581L, CCN E1 gain.  EMLA for fusion of BX [id number], FGF 23 gain, FGF 6 gain, MET R988C, T p53 S1 71F. ERBB2 gain but no other actionable mutations repeat NGS testing at progression showed loss of HER2 expression.  He was therefore switched to FOLFIRI chemotherapy.   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  Case discussed with neurosurgery and this spot was thought to be difficult to biopsy.    MRI brain in December 2022 showed significant enlargement of this mass to 4 cm with associated edema in the bilateral occipital lobes and marginal hemorrhage along the left aspect of the mass.  Increased subdural hematoma along the left cerebral convexity.  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  Patient received [facility] radiation since single fraction to this mass.  Patient also has an intra choroidal met for which he will be seeing retina specialist at [facility]   Also noted to have fracture of the right upper  extremity possibly pathologic and underwent palliative radiation for the same.   Disease progression in February 2023 and patient was switched to FOLFIRI chemotherapy   Disease progression again in early July 2023 and patient switched to [facility].  Also found to have malignant pleural effusion requiring thoracentesis and new PE on anticoagulation.  Interval history-patient is doing poorly overall.  Appetite has gone down and he is unable to feed himself on his own.  Patient's daughter Mechele Claude has moved into his house to live with him and help him out.  He reports significant fatigue and exertional shortness of breath  ECOG PS- 3 Pain scale- 2 Opioid associated constipation- no  Review of systems- Review of Systems  Constitutional:  Positive for malaise/fatigue and weight loss. Negative for chills and fever.  HENT:  Negative for congestion, ear discharge and nosebleeds.   Eyes:  Negative for blurred vision.  Respiratory:  Positive for shortness of breath. Negative for cough, hemoptysis, sputum production and wheezing.   Cardiovascular:  Negative for chest pain, palpitations, orthopnea and claudication.  Gastrointestinal:  Negative for abdominal pain, blood in stool, constipation, diarrhea, heartburn, melena, nausea and vomiting.  Genitourinary:  Negative for dysuria, flank pain, frequency, hematuria and urgency.  Musculoskeletal:  Negative for back pain, joint pain and myalgias.  Skin:  Negative for rash.  Neurological:  Negative for dizziness, tingling, focal weakness, seizures, weakness and headaches.  Endo/Heme/Allergies:  Does not bruise/bleed easily.  Psychiatric/Behavioral:  Negative for depression and suicidal ideas. The patient does not have insomnia.       No Known Allergies   Past Medical History:  Diagnosis Date   Esophageal adenocarcinoma ([place])  06/23/2020   Family history of brain cancer    Family history of colon cancer    Family history of melanoma    Family history of  stomach cancer    GERD (gastroesophageal reflux disease)    History of kidney stones    Nephrolithiasis    [facility]   Pancreatitis, acute    Personal history of colonic polyps    Dr Nicolasa Ducking   Psoriasis      Past Surgical History:  Procedure Laterality Date   COLONOSCOPY     COLONOSCOPY WITH PROPOFOL N/A 03/31/2020   Procedure: COLONOSCOPY WITH PROPOFOL;  Surgeon: Jonathon Bellows, MD;  Location: [facility] ENDOSCOPY;  Service: Gastroenterology;  Laterality: N/A;   ERCP     ESOPHAGOGASTRODUODENOSCOPY (EGD) WITH PROPOFOL N/A 06/19/2020   Procedure: ESOPHAGOGASTRODUODENOSCOPY (EGD) WITH PROPOFOL;  Surgeon: Jonathon Bellows, MD;  Location: [facility] ENDOSCOPY;  Service: Gastroenterology;  Laterality: N/A;   groin surgery     as a child   PORTA CATH INSERTION N/A 06/28/2020   Procedure: PORTA CATH INSERTION;  Surgeon: Algernon Huxley, MD;  Location: [place] CV LAB;  Service: Cardiovascular;  Laterality: N/A;   SHOULDER SURGERY     UPPER EXTREMITY VENOGRAPHY Right 07/24/2021   Procedure: UPPER EXTREMITY VENOGRAPHY;  Surgeon: Katha Cabal, MD;  Location: [place] CV LAB;  Service: Cardiovascular;  Laterality: Right;    Social History   Socioeconomic History   Marital status: Married    Spouse name: Not on file   Number of children: 2   Years of education: Not on file   Highest education level: Not on file  Occupational History   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  Tobacco Use   Smoking status: Former    Types: Cigarettes    Quit date: 07/08/1988    Years since quitting: 33.6   Smokeless tobacco: Never  Vaping Use   Vaping Use: Never used  Substance and Sexual Activity   Alcohol use: No    Comment: recovering alcoholic for 13 years   Drug use: No   Sexual activity: Not Currently    Birth control/protection: Inserts  Other Topics Concern   Not on file  Social History Narrative   Has living will--needs to get notarized   Daughter should be health care  POA (wife with dementia)   Would accept resuscitation   Would accept tube feeds depending on the circumstance   Social Determinants of Health   Financial Resource Strain: Not on file  Food Insecurity: Not on file  Transportation Needs: Not on file  Physical Activity: Not on file  Stress: Not on file  Social Connections: Not on file  Intimate Partner Violence: Not on file    Family History  Problem Relation Age of Onset   Stomach cancer Mother    Diabetes Brother    Hypertension Brother    Hypertension Father    Prostate cancer Father    Colon cancer Sister    Cancer Other        either cervical or ovarian, d. 13s   Brain cancer Sister      Current Outpatient Medications:    apixaban (ELIQUIS) 5 MG TABS tablet, Two tabs po twice a day for six days then on tab po twice a day afterwards, Disp: 70 tablet, Rfl: 0   fentaNYL (DURAGESIC) 25 MCG/HR, Place 1 patch onto the skin every 3 (three) days., Disp: 10 patch, Rfl: 0   lidocaine-prilocaine (EMLA) cream, Apply 1  application. topically as needed. Apply small amount to port site at least 1 hour prior to it being accessed, cover with plastic wrap, Disp: 30 g, Rfl: 3   metoprolol succinate (TOPROL-XL) 25 MG 24 hr tablet, Take 0.5 tablets (12.5 mg total) by mouth daily., Disp: 15 tablet, Rfl: 0   Oxycodone HCl 10 MG TABS, Take 1 tablet (10 mg total) by mouth every 4 (four) hours as needed., Disp: 120 tablet, Rfl: 0   pantoprazole (PROTONIX) 40 MG tablet, TAKE 1 TABLET BY MOUTH TWICE A DAY BEFORE MEALS, Disp: 60 tablet, Rfl: 11   potassium chloride (KLOR-CON M) 10 MEQ tablet, Take 1 tablet (10 mEq total) by mouth 2 (two) times daily., Disp: 60 tablet, Rfl: 0   ondansetron (ZOFRAN) 8 MG tablet, Take 1 tablet (8 mg total) by mouth every 8 (eight) hours as needed for nausea or vomiting. (Patient not taking: Reported on 02/05/2022), Disp: 20 tablet, Rfl: 0   trifluridine-tipiracil (LONSURF) 20-8.19 MG tablet, Take 60 mg of trifluridine by mouth  2 (two) times daily after a meal. Take within 1 hr after AM & PM meals on days 1-5, 8-12. Repeat every 28 days. (Patient not taking: Reported on 02/05/2022), Disp: , Rfl:   Physical exam:  Vitals:   02/05/22 1446  BP: 132/79  Pulse: (!) 42  Resp: 16  Temp: (!) 97.5 F (36.4 C)  Weight: 150 lb 8 oz (68.3 kg)   Physical Exam Constitutional:      Comments: Patient appears cachectic and fatigued.  He is somewhat drowsy and work of breathing has increased  Cardiovascular:     Rate and Rhythm: Normal rate and regular rhythm.     Heart sounds: Normal heart sounds.  Pulmonary:     Effort: Pulmonary effort is normal.     Breath sounds: Normal breath sounds.  Skin:    General: Skin is warm and dry.  Neurological:     Mental Status: He is alert and oriented to person, place, and time.         Latest Ref Rng & Units 02/05/2022    2:22 PM  CMP  Glucose 70 - 99 mg/dL 156   BUN 8 - 23 mg/dL 18   Creatinine 0.61 - 1.24 mg/dL 1.06   Sodium 135 - 145 mmol/L 134   Potassium 3.5 - 5.1 mmol/L 4.5   Chloride 98 - 111 mmol/L 105   CO2 22 - 32 mmol/L 19   Calcium 8.9 - 10.3 mg/dL 8.5   Total Protein 6.5 - 8.1 g/dL 6.2   Total Bilirubin 0.3 - 1.2 mg/dL 1.4   Alkaline Phos 38 - 126 U/L 182   AST 15 - 41 U/L 25   ALT 0 - 44 U/L 9       Latest Ref Rng & Units 02/05/2022    2:22 PM  CBC  WBC 4.0 - 10.5 K/uL 12.0   Hemoglobin 13.0 - 17.0 g/dL 10.6   Hematocrit 39.0 - 52.0 % 33.1   Platelets 150 - 400 K/uL 330     No images are attached to the encounter.  DG Chest Port 1 View  Result Date: 02/04/2022 CLINICAL DATA:  S/P thoracentesis EXAM: PORTABLE CHEST 1 VIEW COMPARISON:  None Available. FINDINGS: There is a right-sided chest port in place with tip terminating in the distal SVC. Cardiomediastinal silhouette appears within normal limits. Moderate to large right pleural effusion remains. No left pleural effusion. No pneumothorax. Left lung appears clear. IMPRESSION: Status post thoracentesis  without findings of pneumothorax. There remains a residual moderate to large right pleural effusion. Electronically Signed   By: Albin Felling M.D.   On: 02/04/2022 12:54   US THORACENTESIS ASP PLEURAL SPACE W/IMG GUIDE  Result Date: 02/04/2022 INDICATION: Patient with esophageal cancer and recurrent right pleural effusion request received for therapeutic thoracentesis. EXAM: ULTRASOUND GUIDED RIGHT THORACENTESIS MEDICATIONS: Local 1% lidocaine only. COMPLICATIONS: None immediate. PROCEDURE: An ultrasound guided thoracentesis was thoroughly discussed with the patient and questions answered. The benefits, risks, alternatives and complications were also discussed. The patient understands and wishes to proceed with the procedure. Written consent was obtained. Ultrasound was performed to localize and mark an adequate pocket of fluid in the right chest. The area was then prepped and draped in the normal sterile fashion. 1% Lidocaine was used for local anesthesia. Under ultrasound guidance a 19 gauge, 7-cm, Yueh catheter was introduced. Thoracentesis was performed. The catheter was removed and a dressing applied. Postprocedure remaining large pleural effusion is seen. FINDINGS: A total of approximately 1.4 L of blood-tinged fluid was removed. IMPRESSION: Successful ultrasound guided right thoracentesis yielding 1.4 L of pleural fluid. This exam was performed by Tsosie Billing PA-C, and was supervised and interpreted by Dr. Denna Haggard. Electronically Signed   By: Albin Felling M.D.   On: 02/04/2022 12:53   US THORACENTESIS ASP PLEURAL SPACE W/IMG GUIDE  Result Date: 01/29/2022 INDICATION: Pleural effusion, history esophageal adenocarcinoma, admitted with acute PE EXAM: ULTRASOUND GUIDED RIGHT THORACENTESIS MEDICATIONS: None. COMPLICATIONS: None immediate. PROCEDURE: An ultrasound guided thoracentesis was thoroughly discussed with the patient and questions answered. The benefits, risks, alternatives and complications  were also discussed. The patient understands and wishes to proceed with the procedure. Written consent was obtained. Ultrasound was performed to localize and mark an adequate pocket of fluid in the right chest. The area was then prepped and draped in the normal sterile fashion. 1% Lidocaine was used for local anesthesia. Under ultrasound guidance a 19 gauge, 7-cm, Yueh catheter was introduced. Thoracentesis was performed. The catheter was removed and a dressing applied. FINDINGS: A total of approximately 1.2L of red tinged fluid was removed. Samples were sent to the laboratory as requested by the clinical team. IMPRESSION: Successful ultrasound guided right thoracentesis yielding 1.2L of pleural fluid. Performed and dictated by Pasty Spillers, PA-C Electronically Signed   By: Markus Daft M.D.   On: 01/29/2022 08:18   ECHOCARDIOGRAM COMPLETE  Result Date: 01/28/2022    ECHOCARDIOGRAM REPORT   Patient Name:   HEZZIE KARIM Date of Exam: 01/28/2022 Medical Rec #:  [id number]      Height:       71.0 in Accession #:    [id number]     Weight:       160.0 lb Date of Birth:  01-06-1956     BSA:          1.918 m Patient Age:    59 years       BP:           129/78 mmHg Patient Gender: M              HR:           70 bpm. Exam Location:  [facility] Procedure: 2D Echo, Color Doppler, Cardiac Doppler and Intracardiac            Opacification Agent Indications:     I26.09 Pulmonary Embolus  History:         Patient has prior history of Echocardiogram examinations, most  recent 11/05/2021. Hx of esphageal adenocarcinoma.  Sonographer:     Charmayne Sheer Referring Phys:  [id number] Athena Masse Diagnosing Phys: [name] MD IMPRESSIONS  1. Left ventricular ejection fraction, by estimation, is 45 to 50%. The left ventricle has mildly decreased function. The left ventricle demonstrates global hypokinesis. The left ventricular internal cavity size was mildly dilated. There is mild left ventricular hypertrophy. Left  ventricular diastolic parameters are consistent with Grade I diastolic dysfunction (impaired relaxation).  2. Right ventricular systolic function is normal. The right ventricular size is normal. There is normal pulmonary artery systolic pressure.  3. Left atrial size was mildly dilated.  4. The mitral valve is normal in structure. No evidence of mitral valve regurgitation. No evidence of mitral stenosis.  5. The aortic valve is normal in structure. Aortic valve regurgitation is not visualized. No aortic stenosis is present.  6. The inferior vena cava is normal in size with greater than 50% respiratory variability, suggesting right atrial pressure of 3 mmHg.  7. Stable LV apical mass measuring 18-13 mm. FINDINGS  Left Ventricle: Left ventricular ejection fraction, by estimation, is 45 to 50%. The left ventricle has mildly decreased function. The left ventricle demonstrates global hypokinesis. Definity contrast agent was given IV to delineate the left ventricular  endocardial borders. The left ventricular internal cavity size was mildly dilated. There is mild left ventricular hypertrophy. Left ventricular diastolic parameters are consistent with Grade I diastolic dysfunction (impaired relaxation). Right Ventricle: The right ventricular size is normal. No increase in right ventricular wall thickness. Right ventricular systolic function is normal. There is normal pulmonary artery systolic pressure. The tricuspid regurgitant velocity is 2.32 m/s, and  with an assumed right atrial pressure of 3 mmHg, the estimated right ventricular systolic pressure is 88.4 mmHg. Left Atrium: Left atrial size was mildly dilated. Right Atrium: Right atrial size was normal in size. Pericardium: There is no evidence of pericardial effusion. Mitral Valve: The mitral valve is normal in structure. No evidence of mitral valve regurgitation. No evidence of mitral valve stenosis. Tricuspid Valve: The tricuspid valve is normal in structure.  Tricuspid valve regurgitation is trivial. No evidence of tricuspid stenosis. Aortic Valve: The aortic valve is normal in structure. Aortic valve regurgitation is not visualized. No aortic stenosis is present. Aortic valve mean gradient measures 3.0 mmHg. Aortic valve peak gradient measures 6.5 mmHg. Aortic valve area, by VTI measures 3.00 cm. Pulmonic Valve: The pulmonic valve was normal in structure. Pulmonic valve regurgitation is not visualized. No evidence of pulmonic stenosis. Aorta: The aortic root is normal in size and structure. Venous: The inferior vena cava is normal in size with greater than 50% respiratory variability, suggesting right atrial pressure of 3 mmHg. IAS/Shunts: No atrial level shunt detected by color flow Doppler.  LEFT VENTRICLE PLAX 2D LVIDd:         5.30 cm      Diastology LVIDs:         4.80 cm      LV e' medial:    4.24 cm/s LV PW:         1.10 cm      LV E/e' medial:  17.8 LV IVS:        1.10 cm      LV e' lateral:   5.44 cm/s LVOT diam:     2.30 cm      LV E/e' lateral: 13.9 LV SV:         68 LV SV Index:  36 LVOT Area:     4.15 cm  LV Volumes (MOD) LV vol d, MOD A2C: 122.0 ml LV vol d, MOD A4C: 113.0 ml LV vol s, MOD A2C: 63.0 ml LV vol s, MOD A4C: 64.3 ml LV SV MOD A2C:     59.0 ml LV SV MOD A4C:     113.0 ml LV SV MOD BP:      63.2 ml RIGHT VENTRICLE RV Basal diam:  3.10 cm RV Mid diam:    3.60 cm TAPSE (M-mode): 3.0 cm LEFT ATRIUM           Index        RIGHT ATRIUM           Index LA diam:      2.90 cm 1.51 cm/m   RA Area:     13.10 cm LA Vol (A2C): 55.2 ml 28.78 ml/m  RA Volume:   29.20 ml  15.23 ml/m LA Vol (A4C): 27.8 ml 14.50 ml/m  AORTIC VALVE                    PULMONIC VALVE AV Area (Vmax):    2.56 cm     PV Vmax:       0.92 m/s AV Area (Vmean):   2.61 cm     PV Peak grad:  3.4 mmHg AV Area (VTI):     3.00 cm AV Vmax:           127.00 cm/s AV Vmean:          87.000 cm/s AV VTI:            0.227 m AV Peak Grad:      6.5 mmHg AV Mean Grad:      3.0 mmHg LVOT Vmax:          78.30 cm/s LVOT Vmean:        54.600 cm/s LVOT VTI:          0.164 m LVOT/AV VTI ratio: 0.72  AORTA Ao Root diam: 3.60 cm MITRAL VALVE               TRICUSPID VALVE MV Area (PHT): 4.54 cm    TR Peak grad:   21.5 mmHg MV Decel Time: 167 msec    TR Vmax:        232.00 cm/s MV E velocity: 75.40 cm/s MV A velocity: 96.40 cm/s  SHUNTS MV E/A ratio:  0.78        Systemic VTI:  0.16 m                            Systemic Diam: 2.30 cm [name] MD Electronically signed by [name] MD Signature Date/Time: 01/28/2022/12:04:50 PM    Final    DG Chest Port 1 View  Result Date: 01/27/2022 CLINICAL DATA:  Status post thoracentesis. EXAM: PORTABLE CHEST 1 VIEW COMPARISON:  01/26/2022 and older studies. FINDINGS: Interval decrease in the right lower lung zone opacity, although significant opacity persist extending to the mid hemithorax obscuring the right heart border and hemidiaphragm. Mild opacity at the left lung base consistent with a small effusion and atelectasis, similar to the previous day's exam. No pneumothorax. Stable right anterior chest wall Port-A-Cath. IMPRESSION: 1. Status post right-sided thoracentesis. There has been a reduction in right pleural fluid although significant opacity persists in the right mid to lower lung. 2. No pneumothorax. Electronically Signed   By: Shanon Brow  Ormond M.D.   On: 01/27/2022 10:56   US Venous Img Lower Bilateral (DVT)  Result Date: 01/27/2022 CLINICAL DATA:  Recent pulmonary embolism.  Assess for DVT. EXAM: BILATERAL LOWER EXTREMITY VENOUS DOPPLER ULTRASOUND TECHNIQUE: Gray-scale sonography with compression, as well as color and duplex ultrasound, were performed to evaluate the deep venous system(s) from the level of the common femoral vein through the popliteal and proximal calf veins. COMPARISON:  None Available. FINDINGS: VENOUS Normal compressibility of the common femoral, superficial femoral, and popliteal veins, as well as the visualized calf veins.  Visualized portions of profunda femoral vein and great saphenous vein unremarkable. No filling defects to suggest DVT on grayscale or color Doppler imaging. Doppler waveforms show normal direction of venous flow, normal respiratory plasticity and response to augmentation. Limited views of the contralateral common femoral vein are unremarkable. OTHER None. Limitations: none IMPRESSION: Negative. Electronically Signed   By: Kerby Moors M.D.   On: 01/27/2022 10:40   CT Head Wo Contrast  Result Date: 01/27/2022 CLINICAL DATA:  Brain/CNS neoplasm. Monitor to exclude hemorrhage prior to anticoagulation for pulmonary embolus. Previous CT PE scan of the chest 2 hours ago. EXAM: CT HEAD WITHOUT CONTRAST TECHNIQUE: Contiguous axial images were obtained from the base of the skull through the vertex without intravenous contrast. RADIATION DOSE REDUCTION: This exam was performed according to the departmental dose-optimization program which includes automated exposure control, adjustment of the mA and/or kV according to patient size and/or use of iterative reconstruction technique. COMPARISON:  MRI brain 07/29/2021.  CT head 06/03/2025 FINDINGS: Brain: Residual contrast material in the blood pool limits the evaluation of this examination to assess for intracranial hemorrhage. As visualized no acute intracranial hemorrhages suggested. Residual contrast material could obscure visualization of small hemorrhage. Homogeneously enhancing extra-axial lesion in the right posterior frontal region measures about 1.3 cm in diameter. Homogeneously enhancing extra-axial lesion in the left anterior frontal convexity measures 1.4 cm diameter. Homogeneously enhancing lesion extra-axial in the left temporal region measures 11 mm diameter.probably another extra-axial lesion in the region of the torcula. These lesions may be multiple meningiomas or meningeal metastasis. No mass effect or midline shift. Gray-white matter junctions are  distinct. Ventricles are not dilated. Vascular: No hyperdense vessel or unexpected calcification. Skull: Calvarium appears intact.  No lucent lesions are identified. Sinuses/Orbits: Paranasal sinuses and mastoid air cells are clear. Other: None. IMPRESSION: 1. No gross evidence of acute intracranial hemorrhage although residual contrast material may obscure acute hemorrhage. 2. Multiple extra-axial lesions likely hyperenhancing. These may represent multiple meningiomas or meningeal metastasis. No significant mass effect or midline shift. Electronically Signed   By: Lucienne Capers M.D.   On: 01/27/2022 01:57   CT Angio Chest PE W and/or Wo Contrast  Result Date: 01/27/2022 CLINICAL DATA:  Shortness of breath which worsens with lying down. EXAM: CT ANGIOGRAPHY CHEST WITH CONTRAST TECHNIQUE: Multidetector CT imaging of the chest was performed using the standard protocol during bolus administration of intravenous contrast. Multiplanar CT image reconstructions and MIPs were obtained to evaluate the vascular anatomy. RADIATION DOSE REDUCTION: This exam was performed according to the departmental dose-optimization program which includes automated exposure control, adjustment of the mA and/or kV according to patient size and/or use of iterative reconstruction technique. CONTRAST:  41m OMNIPAQUE IOHEXOL 350 MG/ML SOLN COMPARISON:  January 11, 2022 FINDINGS: Cardiovascular: A right-sided venous Port-A-Cath is in place. There is moderate severity calcification of the aortic arch, without evidence of aortic aneurysm. A small amount of intraluminal low attenuation is seen  involving subsegmental lower lobe branches of the left pulmonary artery. Normal heart size with mild right heart strain (RV/LV ratio of 1.04). No pericardial effusion. Mediastinum/Nodes: There is stable AP window, pretracheal and left hilar lymphadenopathy. Thyroid gland, trachea, and esophagus demonstrate no significant findings. Lungs/Pleura: Moderate  severity compressive atelectasis is seen within the right upper lobe, right middle lobe and right lower lobe. Mild posterior left lower lobe atelectatic changes are also seen. Innumerable, stable solid pulmonary nodules are again seen. There is a very large right pleural effusion which is increased in size when compared to the prior study. A small left pleural effusion is also seen. This represents a new finding when compared to the prior study. No pneumothorax is identified. Upper Abdomen: The liver is cirrhotic in appearance, with mild to moderate severity splenomegaly. Musculoskeletal: A chronic deformity is seen involving the proximal right humeral shaft. A chronic compression fracture deformity is also noted at the level of T6. Multilevel degenerative changes seen throughout the thoracic spine. Review of the MIP images confirms the above findings. IMPRESSION: 1. Small amount of pulmonary embolism involving subsegmental lower lobe branches of the left pulmonary artery, with mild right heart strain. 2. Very large right pleural effusion which is increased in size when compared to the prior study, with associated moderate severity compressive atelectasis throughout the right lung. 3. Small left pleural effusion with mild posterior left lower lobe atelectatic changes. 4. Innumerable, stable solid pulmonary nodules consistent with pulmonary metastasis. 5. Cirrhotic appearance of the liver with mild to moderate severity splenomegaly. Aortic Atherosclerosis (ICD10-I70.0). Electronically Signed   By: Virgina Norfolk M.D.   On: 01/27/2022 00:51   DG Chest 2 View  Result Date: 01/26/2022 CLINICAL DATA:  Shortness of breath EXAM: CHEST - 2 VIEW COMPARISON:  07/05/2020 FINDINGS: Large right pleural effusion. Left lung is clear. Normal cardiomediastinal contours. Right chest wall Port-A-Cath tip in the mid SVC. IMPRESSION: Large right pleural effusion. Electronically Signed   By: Ulyses Jarred M.D.   On: 01/26/2022  21:59   NM Bone Scan Whole Body  Result Date: 01/12/2022 CLINICAL DATA:  Metastatic esophageal cancer EXAM: NUCLEAR MEDICINE WHOLE BODY BONE SCAN TECHNIQUE: Whole body anterior and posterior images were obtained approximately 3 hours after intravenous injection of radiopharmaceutical. RADIOPHARMACEUTICALS:  23.44 mCi Technetium-13mMDP IV COMPARISON:  CT chest abdomen pelvis 01/11/2022, bone scan 10/31/2021, PET CT 05/22/2021 FINDINGS: Widespread skeletal metastatic disease involving the sternum, calvarium, right humerus, bilateral ribs, pelvic bones, spine and left femur. Small focus of activity at the proximal left tibia without change. 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 The overall distribution of activity is grossly similar as compared with the prior bone scan though there may be slightly more intense sternal activity. No definitive new focus of activity is visualized. Physiologic renal and bladder activity. IMPRESSION: Findings consistent with widespread skeletal metastatic disease with overall grossly stable distribution of activity since prior bone scan with exception of slightly more intense activity involving the sternum. Electronically Signed   By: KDonavan FoilM.D.   On: 01/12/2022 21:54   CT CHEST ABDOMEN PELVIS W CONTRAST  Result Date: 01/11/2022 CLINICAL DATA:  Metastatic esophageal cancer on chemotherapy. Restaging. * Tracking Code: BO * EXAM: CT CHEST, ABDOMEN, AND PELVIS WITH CONTRAST TECHNIQUE: Multidetector CT imaging of the chest, abdomen and pelvis was performed following the standard protocol during bolus administration of intravenous contrast. RADIATION DOSE REDUCTION: This exam was performed  according to the departmental dose-optimization program which includes automated exposure control, adjustment of the mA and/or kV according to patient size and/or use of iterative reconstruction technique. CONTRAST:   141m OMNIPAQUE IOHEXOL 300 MG/ML  SOLN COMPARISON:  10/31/2021 CT chest, abdomen and pelvis. FINDINGS: CT CHEST FINDINGS Cardiovascular: Normal heart size. No significant pericardial effusion/thickening. Left anterior descending and right coronary atherosclerosis. Right internal jugular Port-A-Cath terminates in the lower third of the SVC. Atherosclerotic nonaneurysmal thoracic aorta. Normal caliber pulmonary arteries. No central pulmonary emboli. Mediastinum/Nodes: No discrete thyroid nodules. Small amount of oral contrast throughout the thoracic esophageal lumen. No discrete thoracic esophageal mass. No axillary adenopathy. Mildly enlarged 1.4 cm short axis diameter right paratracheal node (series 2/image 25), increased from 0.8 cm. Newly enlarged 1.4 cm left hilar node (series 2/image 29). No pathologically enlarged right hilar nodes. Lungs/Pleura: No pneumothorax. Moderate dependent right pleural effusion is essentially new. New trace dependent left pleural effusion. Innumerable (> 20) solid pulmonary nodules scattered throughout both lungs, increased. Representative medial right middle lobe 2.7 x 1.6 cm nodule (series 4/image 90), increased from 2.1 x 1.3 cm. Representative medial left lower lobe 0.9 cm nodule (series 4/image 125), increased from 0.7 cm. Representative 0.5 cm posterior right lower lobe pulmonary nodule (series 4/image 124), not definitely seen on prior CT. Musculoskeletal: Innumerable subtle permeative osseous lesions throughout the thoracic skeleton including sternum, manubrium, thoracic spine, ribs and right humerus, mildly increased. Representative 2.6 cm manubrial lesion (series 6/image 131), increased from 2.3 cm. Representative 1.8 cm T8 vertebral lesion (series 6/image 123), increased from 1.5 cm. 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 Moderate thoracic spondylosis. CT ABDOMEN PELVIS FINDINGS Hepatobiliary: Diffusely irregular liver surface  compatible with cirrhosis. Hypodense 0.8 cm posterior right liver lesion is too small to characterize and unchanged (series 2/image 60). No new liver lesions. Normal gallbladder with no radiopaque cholelithiasis. No biliary ductal dilatation. Pancreas: Normal, with no mass or duct dilation. Spleen: Mild to moderate splenomegaly (craniocaudal splenic length 15.0 cm), stable. No splenic mass. Adrenals/Urinary Tract: Normal adrenals. Nonobstructing clustered lower right renal stones, largest 2 mm. No hydronephrosis. No renal masses. Normal bladder. Stomach/Bowel: Normal non-distended stomach. Normal caliber small bowel with no small bowel wall thickening. Normal diminutive appendix. Oral contrast transits to the right colon. Normal large bowel with no diverticulosis, large bowel wall thickening or pericolonic fat stranding. Vascular/Lymphatic: Atherosclerotic nonaneurysmal abdominal aorta. Patent portal, splenic, hepatic and renal veins. No pathologically enlarged lymph nodes in the abdomen or pelvis. Reproductive: Normal size prostate. Other: No pneumoperitoneum, ascites or focal fluid collection. Musculoskeletal: Marked lumbar spondylosis. Lytic 2.9 cm left ischial lesion (series 5/image 94), increased from 2.2 cm. Patchy sclerotic lesions throughout the right ischium is unchanged. No new focal osseous lesions. IMPRESSION: 1. Interval progression of pulmonary metastases, metastatic thoracic adenopathy and widespread osseous metastatic disease, as detailed. 2. New moderate right and trace left pleural effusions. 3. Cirrhosis. Stable mild to moderate splenomegaly. 4. 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 5. Chronic findings include: Coronary atherosclerosis. Nonobstructing right nephrolithiasis. Aortic Atherosclerosis (ICD10-I70.0). Electronically Signed   By: JIlona SorrelM.D.   On: 01/11/2022 19:13     Assessment and plan- Patient is a 66y.o. male with metastatic esophageal  adenocarcinoma here to discuss further management  Patient was switched to third line Lonsurf treatment about 2 weeks ago.  Although his counts are okay on Lonsurf he is doing poorly overall.  Further decline in his performance status.  He is unable  to feed himself.  He had thoracentesis done yesterday but continues to feel short of breath.  And therefore recommend not pursuing any active treatment at this time and proceeding with home hospice.  Patient's wife was previously on [facility] hospice and patient would like to continue with the same services.  We will make a referral for the same today.  He is already a DNR/DNI.  We discussed managing his shortness of breath purely with medications instead of considering Pleurx catheter at this time.  Patient is currently on Eliquis for his pulmonary embolism which can be continued if hospice can afford it or can be stopped.  Overall his prognosis is poor and likely in weeks.  Patient and his family understand the overall situation and are agreeable to proceeding with home hospice at this time.   Visit Diagnosis 1. Esophageal adenocarcinoma ([place])   2. High risk medication use   3. Goals of care, counseling/discussion      Dr. Randa Evens, MD, MPH [facility] at [facility] [phone] 02/06/2022 8:18 AM

## 2022-02-07 ENCOUNTER — Ambulatory Visit: Payer: Medicare Other | Admitting: Cardiovascular Disease

## 2022-02-07 ENCOUNTER — Ambulatory Visit: Payer: Medicare Other | Admitting: Internal Medicine

## 2022-02-07 ENCOUNTER — Telehealth: Payer: Self-pay | Admitting: *Deleted

## 2022-02-07 NOTE — Telephone Encounter (Signed)
I had called to see if pt got hospice services. The daughter had already called about hospice and the pt's wife is already on the hospice services. Berdette I think was her name and she said they opened him yest

## 2022-02-08 ENCOUNTER — Encounter: Payer: Medicare Other | Admitting: Internal Medicine

## 2022-03-08 DEATH — deceased

## 2022-04-04 ENCOUNTER — Encounter: Payer: Self-pay | Admitting: Oncology

## 2022-05-06 ENCOUNTER — Encounter (INDEPENDENT_AMBULATORY_CARE_PROVIDER_SITE_OTHER): Payer: Self-pay

## 2022-10-08 IMAGING — PT NM PET TUM IMG INITIAL (PI) SKULL BASE T - THIGH
7 series · 25 of 25 positions shown · non-contrast
Comparison: CT abdomen 03/25/2012

CLINICAL DATA: Initial treatment strategy for esophageal
adenocarcinoma.

EXAM:
NUCLEAR MEDICINE PET SKULL BASE TO THIGH
TECHNIQUE: 10.8 mCi F-18 FDG was injected intravenously. Full-ring PET imaging
was performed from the skull base to thigh after the radiotracer. CT
data was obtained and used for attenuation correction and anatomic
localization.
Fasting blood glucose: 68 mg/dl

[Series 3: pet sk_thigh ac · axial · 5.0mm · 4.07mm/px · z∈[-1508,-544]mm · 6 of 242 slices shown]
[im 1/242]
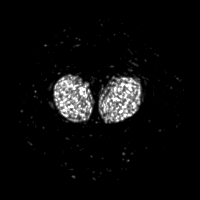
[im 49/242]
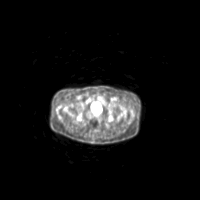
[im 97/242]
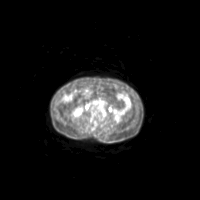
[im 145/242]
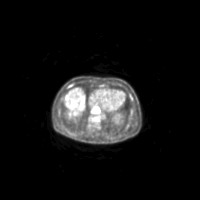
[im 193/242]
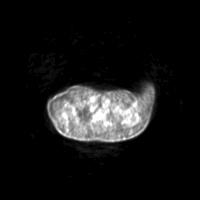
[im 242/242]
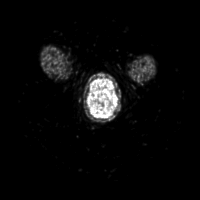

[Series 4: ct sk_thigh 5.0 bf37 · axial · 5.0mm · 0.98mm/px · z∈[-1508,-544]mm · 5 of 242 slices shown]
[im 1/242]
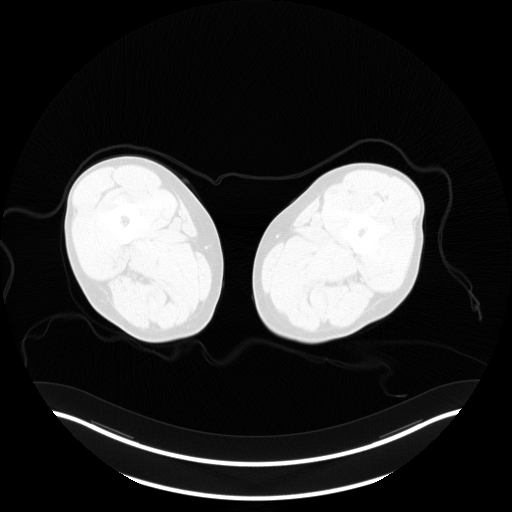
[im 61/242]
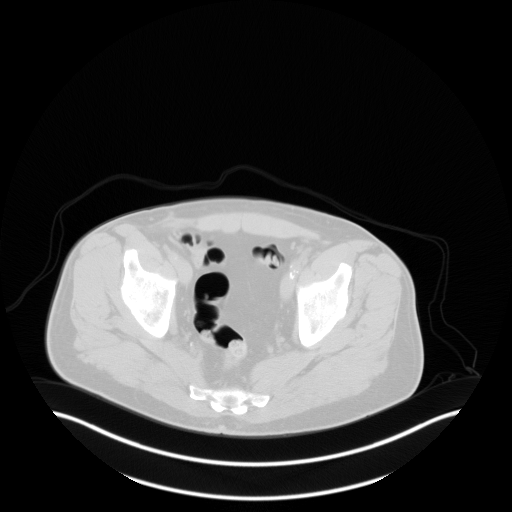
[im 121/242]
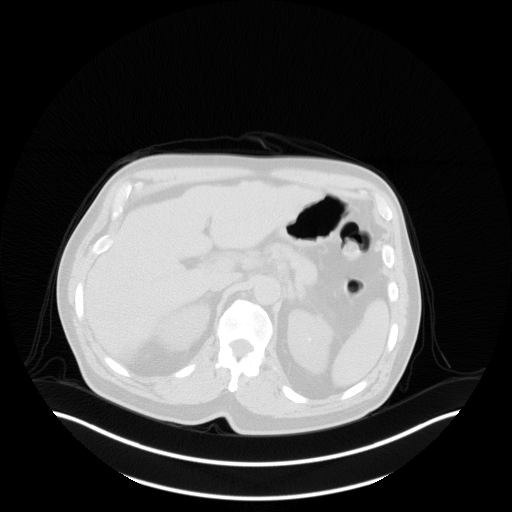
[im 181/242]
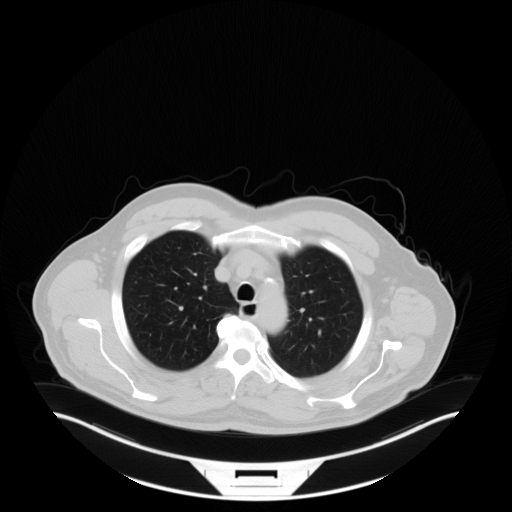
[im 242/242  brain]
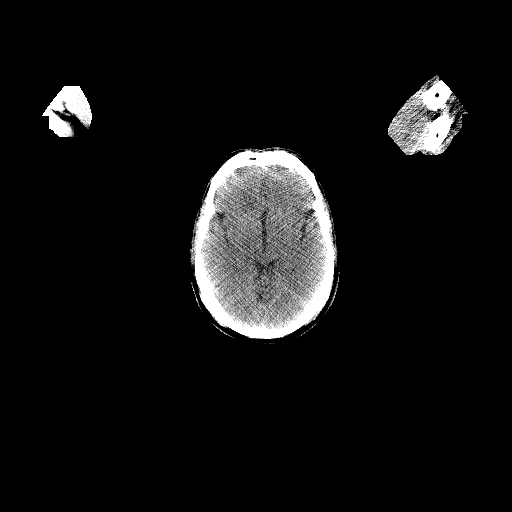

[Series 5: pet sk_thigh nac · axial · 5.0mm · 4.07mm/px · z∈[-1508,-544]mm · 5 of 242 slices shown]
[im 1/242]
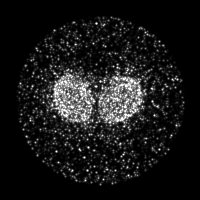
[im 61/242]
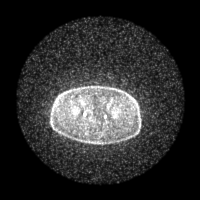
[im 121/242]
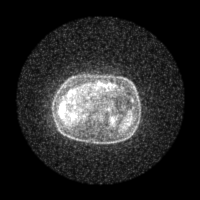
[im 181/242]
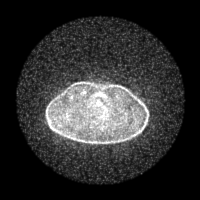
[im 242/242]
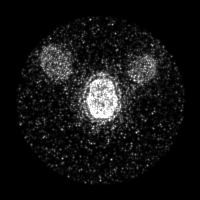

[Series 8: ct sk_thigh 5.0 br59 lung_bone · axial · 5.0mm · 0.72mm/px · z∈[-979,-707]mm · 2 of 69 slices shown]
[im 1/69]
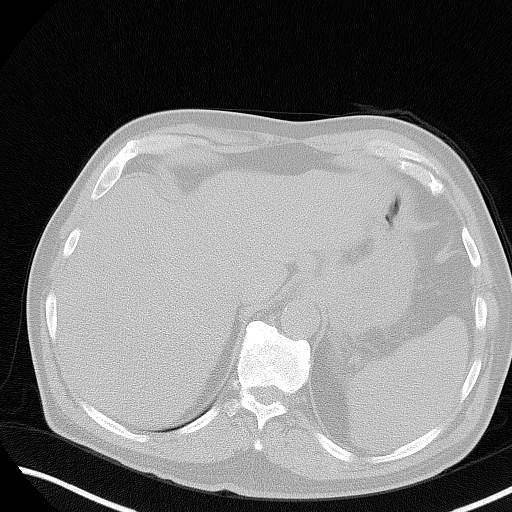
[im 69/69]
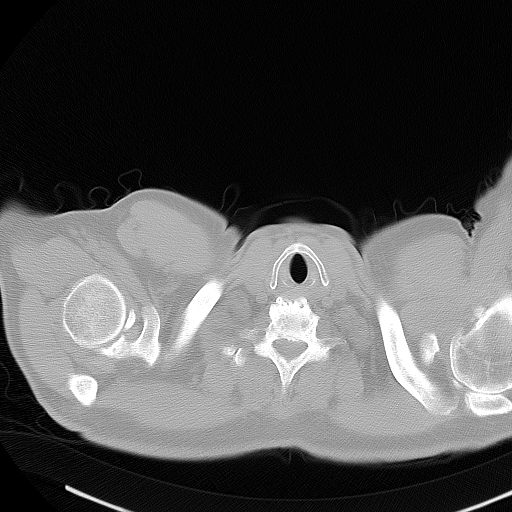

[Series 603: <mip collection> · coronal · 2.00mm/px · 1 of 32 slices shown]
[im 1/32]
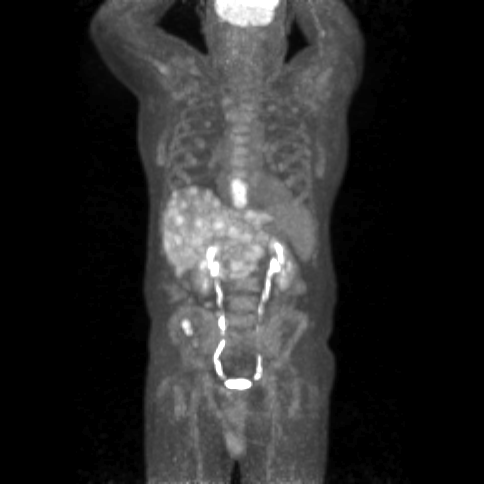

[Series 604: fused cor · 1 of 58 slices shown]
[im 1/58]
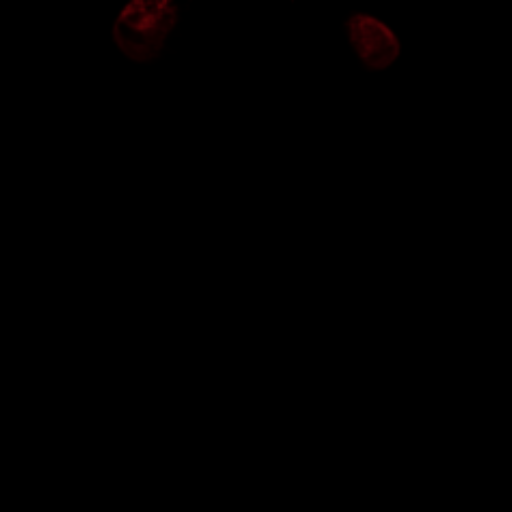

[Series 605: range-ct sk_thigh 5.0 bf37-tra-<alpha range> · 5 of 235 slices shown]
[im 1/235]
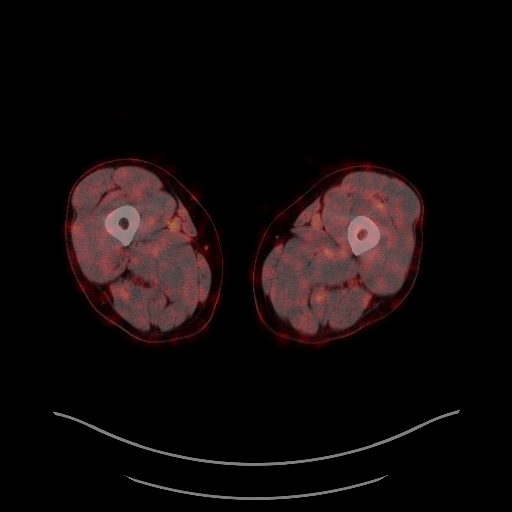
[im 59/235]
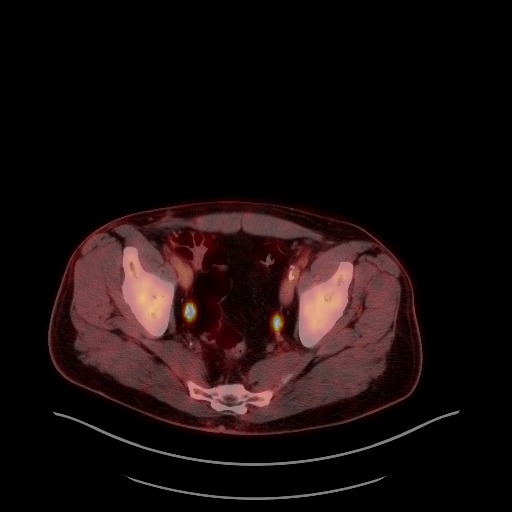
[im 118/235]
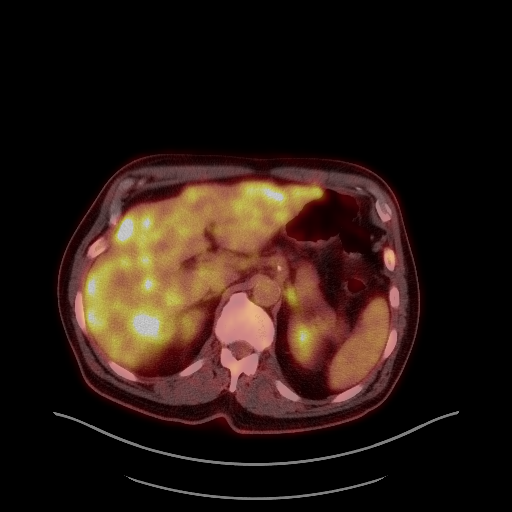
[im 176/235]
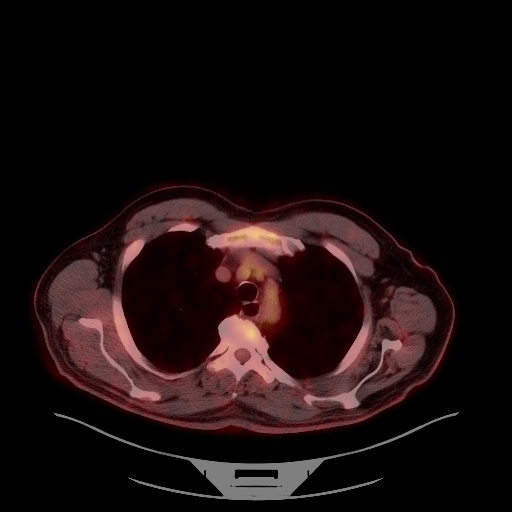
[im 235/235]
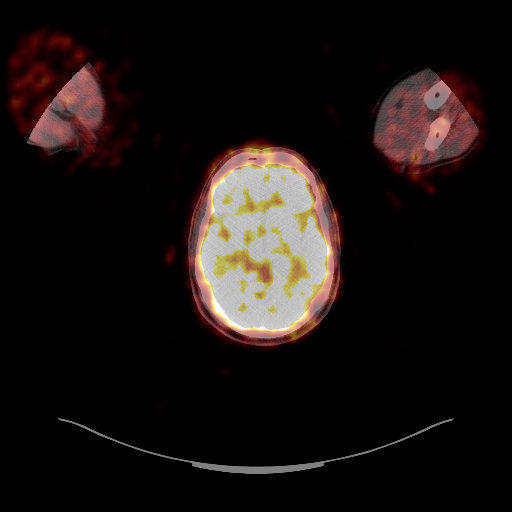

[25 of 25 positions shown; findings below may reference images not displayed]

FINDINGS: Mediastinal blood pool activity: SUV max

Liver activity: SUV max NA

NECK: No significant abnormal hypermetabolic activity in this
region.

Incidental CT findings: none

CHEST: Circumferential distal esophageal mass has a maximum SUV of
13.3 and extends over a 5.5 cm length.

Right lower paratracheal lymph node 0.6 cm in short axis on image 68
series 4, maximum SUV 3.1.

Incidental CT findings: none

ABDOMEN/PELVIS: Hypermetabolic right gastric lymph nodes are present
including a 1.1 cm in short axis node on image 114 of series 4 with
maximum SUV 5.8.

Unfortunately there are numerous hypermetabolic lesions scattered
throughout the liver, somewhat confluent especially along the
periphery, with a nodular pattern of the hepatic contour probably
from pseudo cirrhosis. Index lesion anteriorly in the lateral
segment left hepatic lobe about at the level of image 117 of series
4 has a maximum SUV of 8.0. A posterior right hepatic lobe lesion
about at the level of image 111 has a maximum SUV of 9.0.

Faintly hypermetabolic left adrenal gland without a discretely
visualized mass, maximum SUV 4.1. Probably incidental but merits
surveillance.

Accentuated metabolic activity along the small bowel adjacent to the
cecum, maximum SUV 11.8, no definite CT abnormality, questionable
physiologic activity.

Incidental CT findings: Aortoiliac atherosclerotic vascular disease.
Nonobstructive bilateral nephrolithiasis.

SKELETON: No significant abnormal hypermetabolic activity in this
region.

Incidental CT findings: Bridging spurring of the sacroiliac joints,
left greater than right. Mild levoconvex lumbar scoliosis with
rotary component. Degenerative arthropathy of both glenohumeral
joints.
IMPRESSION: 1. Hypermetabolic distal esophageal mass, maximum SUV 13.3, about
5.5 cm in length.
2. Hypermetabolic right gastric lymph nodes with numerous somewhat
confluent peripheral hypermetabolic lesions scattered throughout the
liver, causing a pseudo cirrhosis appearance.
3. Faintly accentuated metabolic activity in the left adrenal gland
without a discrete visualized mass, probably incidental.
4. Bilateral nonobstructive nephrolithiasis.
5.  Aortic Atherosclerosis ([id number]).
